# Patient Record
Sex: Female | Born: 1960
Health system: Southern US, Community
[De-identification: ages and names within clinical notes are randomized; demographics above are authoritative.]

## PROBLEM LIST (undated history)

## (undated) DIAGNOSIS — F419 Anxiety disorder, unspecified: Secondary | ICD-10-CM

## (undated) DIAGNOSIS — D649 Anemia, unspecified: Secondary | ICD-10-CM

## (undated) DIAGNOSIS — N200 Calculus of kidney: Secondary | ICD-10-CM

## (undated) DIAGNOSIS — K319 Disease of stomach and duodenum, unspecified: Secondary | ICD-10-CM

## (undated) DIAGNOSIS — F32A Depression, unspecified: Secondary | ICD-10-CM

## (undated) DIAGNOSIS — G43909 Migraine, unspecified, not intractable, without status migrainosus: Secondary | ICD-10-CM

## (undated) DIAGNOSIS — Z9889 Other specified postprocedural states: Secondary | ICD-10-CM

## (undated) DIAGNOSIS — R55 Syncope and collapse: Secondary | ICD-10-CM

## (undated) DIAGNOSIS — F509 Eating disorder, unspecified: Secondary | ICD-10-CM

## (undated) DIAGNOSIS — F329 Major depressive disorder, single episode, unspecified: Secondary | ICD-10-CM

## (undated) HISTORY — PX: TUBAL LIGATION: SHX77

## (undated) HISTORY — DX: Eating disorder, unspecified: F50.9

## (undated) HISTORY — DX: Calculus of kidney: N20.0

## (undated) HISTORY — DX: Depression, unspecified: F32.A

## (undated) HISTORY — DX: Major depressive disorder, single episode, unspecified: F32.9

## (undated) HISTORY — DX: Anemia, unspecified: D64.9

## (undated) HISTORY — DX: Syncope and collapse: R55

## (undated) HISTORY — PX: BREAST BIOPSY: SHX20

## (undated) HISTORY — DX: Other specified postprocedural states: Z98.890

---
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
## 1992-05-03 DIAGNOSIS — Z9889 Other specified postprocedural states: Secondary | ICD-10-CM

## 1992-05-03 HISTORY — PX: LAMINECTOMY: SHX219

## 1992-05-03 HISTORY — DX: Other specified postprocedural states: Z98.890

## 1995-02-11 LAB — US OB COMP ADDL GEST + 14 WK

## 1997-07-30 ENCOUNTER — Other Ambulatory Visit: Admission: RE | Admit: 1997-07-30 | Discharge: 1997-07-30 | Payer: Self-pay | Admitting: Obstetrics and Gynecology

## 1999-02-03 ENCOUNTER — Other Ambulatory Visit: Admission: RE | Admit: 1999-02-03 | Discharge: 1999-02-03 | Payer: Self-pay | Admitting: Obstetrics and Gynecology

## 2000-03-16 ENCOUNTER — Other Ambulatory Visit: Admission: RE | Admit: 2000-03-16 | Discharge: 2000-03-16 | Payer: Self-pay | Admitting: Obstetrics and Gynecology

## 2001-05-05 ENCOUNTER — Other Ambulatory Visit: Admission: RE | Admit: 2001-05-05 | Discharge: 2001-05-05 | Payer: Self-pay | Admitting: Obstetrics and Gynecology

## 2002-06-19 ENCOUNTER — Other Ambulatory Visit: Admission: RE | Admit: 2002-06-19 | Discharge: 2002-06-19 | Payer: Self-pay | Admitting: Obstetrics and Gynecology

## 2002-10-26 LAB — HM DEXA SCAN: HM Dexa Scan: NORMAL

## 2003-08-06 ENCOUNTER — Other Ambulatory Visit: Admission: RE | Admit: 2003-08-06 | Discharge: 2003-08-06 | Payer: Self-pay | Admitting: Obstetrics and Gynecology

## 2004-09-15 ENCOUNTER — Other Ambulatory Visit: Admission: RE | Admit: 2004-09-15 | Discharge: 2004-09-15 | Payer: Self-pay | Admitting: Obstetrics and Gynecology

## 2006-09-14 ENCOUNTER — Encounter: Admission: RE | Admit: 2006-09-14 | Discharge: 2006-09-14 | Payer: Self-pay | Admitting: Obstetrics and Gynecology

## 2008-01-29 ENCOUNTER — Ambulatory Visit (HOSPITAL_BASED_OUTPATIENT_CLINIC_OR_DEPARTMENT_OTHER): Admission: RE | Admit: 2008-01-29 | Discharge: 2008-01-29 | Payer: Self-pay | Admitting: Obstetrics and Gynecology

## 2008-11-19 ENCOUNTER — Emergency Department (HOSPITAL_BASED_OUTPATIENT_CLINIC_OR_DEPARTMENT_OTHER): Admission: EM | Admit: 2008-11-19 | Discharge: 2008-11-19 | Payer: Self-pay | Admitting: Emergency Medicine

## 2009-01-30 ENCOUNTER — Ambulatory Visit: Payer: Self-pay | Admitting: Radiology

## 2009-01-30 ENCOUNTER — Ambulatory Visit (HOSPITAL_BASED_OUTPATIENT_CLINIC_OR_DEPARTMENT_OTHER): Admission: RE | Admit: 2009-01-30 | Discharge: 2009-01-30 | Payer: Self-pay | Admitting: Obstetrics and Gynecology

## 2009-02-06 ENCOUNTER — Encounter: Admission: RE | Admit: 2009-02-06 | Discharge: 2009-02-06 | Payer: Self-pay | Admitting: Obstetrics and Gynecology

## 2009-03-02 ENCOUNTER — Emergency Department (HOSPITAL_BASED_OUTPATIENT_CLINIC_OR_DEPARTMENT_OTHER): Admission: EM | Admit: 2009-03-02 | Discharge: 2009-03-02 | Payer: Self-pay | Admitting: Emergency Medicine

## 2009-08-01 HISTORY — PX: CHOLECYSTECTOMY: SHX55

## 2009-10-31 ENCOUNTER — Emergency Department (HOSPITAL_BASED_OUTPATIENT_CLINIC_OR_DEPARTMENT_OTHER): Admission: EM | Admit: 2009-10-31 | Discharge: 2009-10-31 | Payer: Self-pay | Admitting: Emergency Medicine

## 2010-02-09 ENCOUNTER — Ambulatory Visit: Payer: Self-pay | Admitting: Diagnostic Radiology

## 2010-02-09 ENCOUNTER — Ambulatory Visit (HOSPITAL_BASED_OUTPATIENT_CLINIC_OR_DEPARTMENT_OTHER): Admission: RE | Admit: 2010-02-09 | Discharge: 2010-02-09 | Payer: Self-pay | Admitting: Obstetrics and Gynecology

## 2010-02-09 LAB — HM MAMMOGRAPHY: HM Mammogram: NORMAL

## 2010-08-09 LAB — URINALYSIS, ROUTINE W REFLEX MICROSCOPIC
Bilirubin Urine: NEGATIVE
Ketones, ur: NEGATIVE mg/dL
Nitrite: NEGATIVE
Protein, ur: NEGATIVE mg/dL
Urobilinogen, UA: 0.2 mg/dL (ref 0.0–1.0)
pH: 7 (ref 5.0–8.0)

## 2010-10-05 LAB — HM PAP SMEAR: HM Pap smear: ABNORMAL

## 2010-12-30 ENCOUNTER — Telehealth: Payer: Self-pay | Admitting: Family

## 2010-12-30 ENCOUNTER — Encounter: Payer: Self-pay | Admitting: Family

## 2010-12-30 ENCOUNTER — Ambulatory Visit (INDEPENDENT_AMBULATORY_CARE_PROVIDER_SITE_OTHER): Payer: BC Managed Care – PPO | Admitting: Family

## 2010-12-30 ENCOUNTER — Ambulatory Visit (HOSPITAL_BASED_OUTPATIENT_CLINIC_OR_DEPARTMENT_OTHER)
Admission: RE | Admit: 2010-12-30 | Discharge: 2010-12-30 | Disposition: A | Discharge status: Home / Self Care | Payer: BC Managed Care – PPO | Source: Ambulatory Visit | Attending: Family | Admitting: Family

## 2010-12-30 VITALS — BP 100/70 | HR 66 | Temp 97.8°F | Resp 16 | Wt 127.0 lb

## 2010-12-30 DIAGNOSIS — F32A Depression, unspecified: Secondary | ICD-10-CM | POA: Insufficient documentation

## 2010-12-30 DIAGNOSIS — R1011 Right upper quadrant pain: Secondary | ICD-10-CM | POA: Insufficient documentation

## 2010-12-30 DIAGNOSIS — K7689 Other specified diseases of liver: Secondary | ICD-10-CM | POA: Insufficient documentation

## 2010-12-30 DIAGNOSIS — F3289 Other specified depressive episodes: Secondary | ICD-10-CM

## 2010-12-30 DIAGNOSIS — F410 Panic disorder [episodic paroxysmal anxiety] without agoraphobia: Secondary | ICD-10-CM

## 2010-12-30 DIAGNOSIS — F411 Generalized anxiety disorder: Secondary | ICD-10-CM

## 2010-12-30 DIAGNOSIS — F329 Major depressive disorder, single episode, unspecified: Secondary | ICD-10-CM

## 2010-12-30 DIAGNOSIS — K838 Other specified diseases of biliary tract: Secondary | ICD-10-CM | POA: Insufficient documentation

## 2010-12-30 DIAGNOSIS — R3129 Other microscopic hematuria: Secondary | ICD-10-CM

## 2010-12-30 DIAGNOSIS — F419 Anxiety disorder, unspecified: Secondary | ICD-10-CM

## 2010-12-30 DIAGNOSIS — Q619 Cystic kidney disease, unspecified: Secondary | ICD-10-CM

## 2010-12-30 DIAGNOSIS — Z0189 Encounter for other specified special examinations: Secondary | ICD-10-CM

## 2010-12-30 HISTORY — DX: Panic disorder (episodic paroxysmal anxiety): F41.0

## 2010-12-30 LAB — CBC WITH DIFFERENTIAL/PLATELET
Basophils Absolute: 0 10*3/uL (ref 0.0–0.1)
Basophils Relative: 0 % (ref 0–1)
Eosinophils Relative: 2 % (ref 0–5)
HCT: 42.5 % (ref 36.0–46.0)
Hemoglobin: 14.1 g/dL (ref 12.0–15.0)
Lymphocytes Relative: 50 % — ABNORMAL HIGH (ref 12–46)
MCHC: 33.2 g/dL (ref 30.0–36.0)
MCV: 95.5 fL (ref 78.0–100.0)
Monocytes Absolute: 0.6 10*3/uL (ref 0.1–1.0)
Monocytes Relative: 11 % (ref 3–12)
Neutro Abs: 2 10*3/uL (ref 1.7–7.7)
RDW: 12.2 % (ref 11.5–15.5)

## 2010-12-30 LAB — BASIC METABOLIC PANEL
BUN: 7 mg/dL (ref 6–23)
Chloride: 99 mEq/L (ref 96–112)
Creat: 0.79 mg/dL (ref 0.50–1.10)
Glucose, Bld: 89 mg/dL (ref 70–99)
Potassium: 4.3 mEq/L (ref 3.5–5.3)

## 2010-12-30 LAB — AMYLASE: Amylase: 36 U/L (ref 0–105)

## 2010-12-30 LAB — HEPATIC FUNCTION PANEL
AST: 18 U/L (ref 0–37)
Albumin: 4.7 g/dL (ref 3.5–5.2)
Alkaline Phosphatase: 46 U/L (ref 39–117)
Bilirubin, Direct: 0.1 mg/dL (ref 0.0–0.3)
Total Bilirubin: 0.4 mg/dL (ref 0.3–1.2)

## 2010-12-30 LAB — POCT URINALYSIS DIPSTICK
Ketones, UA: NEGATIVE
Spec Grav, UA: 1.005
Urobilinogen, UA: 0.2
pH, UA: 8

## 2010-12-30 MED ORDER — PROMETHAZINE HCL 25 MG PO TABS: 25.0000 mg | ORAL_TABLET | Freq: Four times a day (QID) | ORAL | Status: DC | PRN

## 2010-12-30 MED ORDER — IOHEXOL 300 MG/ML  SOLN
100.0000 mL | Freq: Once | INTRAMUSCULAR | Status: AC | PRN
  Administered 2010-12-30: 100 mL via INTRAVENOUS

## 2010-12-30 NOTE — Assessment & Plan Note (Signed)
Will send for CT abdomen/pelvis.  UA notes trace blood.  Differential includes pancreatitis, ulcer, kidney stone.  She also has a "liver mass" which I presume is a hemangioma- await CT results.  Check basic laboratories.  She requested something for pain.  I recommended tylenol.  Later review of the [place] Controlled Substance Registry notes that she is receiving 120 hydrocodone 10/325 a month from Dr. Alton Revere and has been once a month for the last 1 year.

## 2010-12-30 NOTE — Patient Instructions (Signed)
Please complete your lab work and your CT on the first floor. Follow up in 1 week. Call if symptoms worsen or if they do not improve.

## 2010-12-30 NOTE — Telephone Encounter (Signed)
Pt presented to the office following CT requesting refill on promethazine. Rx sent to pharmacy.

## 2010-12-30 NOTE — Assessment & Plan Note (Signed)
On clonazepam.  This is being managed by psychiatry.
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
## 2010-12-30 NOTE — Assessment & Plan Note (Signed)
This is being managed by Dr. Donell Beers.  She also sees a therapist.

## 2011-01-01 ENCOUNTER — Telehealth: Payer: Self-pay | Admitting: Family

## 2011-01-01 ENCOUNTER — Ambulatory Visit: Payer: Self-pay | Admitting: Family

## 2011-01-01 NOTE — Telephone Encounter (Signed)
Late entry: spoke with pt yesterday morning and reviewed the CT and lab results.  Will refer back to Dr. Logan Bores (GI) for evaluation, though normal LFT's are somewhat reassuring.  Pt is agreeable to plan.

## 2011-01-05 ENCOUNTER — Telehealth: Payer: Self-pay | Admitting: Family

## 2011-01-05 NOTE — Telephone Encounter (Signed)
Dr Logan Bores is not seeing patient in [facility]   The first appt available is November 21st  Do you want her to see someone in [facility] or [facility]

## 2011-01-05 NOTE — Telephone Encounter (Signed)
I would recommend that she be scheduled to see one of his partners at [facility] as they have all of her records.

## 2011-01-27 ENCOUNTER — Other Ambulatory Visit: Payer: Self-pay | Admitting: Family

## 2011-01-27 NOTE — Telephone Encounter (Signed)
Needs OV prior to refill

## 2011-01-27 NOTE — Telephone Encounter (Signed)
Please advise if ok to refill Promethazine. Pt was advised to f/u in 1 week after 12/30/10 office visit and did not. Has no future appt on file.

## 2011-01-27 NOTE — Telephone Encounter (Signed)
Spoke with pt, she had appt with specialist at [facility] in the middle of October. Per Melissa, ok to give #30 x no refills. Refill sent to pharmacy. Message left on home phone.

## 2011-01-28 ENCOUNTER — Encounter: Payer: Self-pay | Admitting: Family

## 2011-01-28 ENCOUNTER — Telehealth: Payer: Self-pay | Admitting: *Deleted

## 2011-01-28 NOTE — Telephone Encounter (Signed)
Received records from Dr Lorin Picket, Dr Adolphus Birchwood, Dr Norma Fredrickson, [place] [place] PA, Julio Sicks, Dr Katha Hamming and Dr Adelene Idler and forwarded to Provider for review.

## 2011-02-15 ENCOUNTER — Other Ambulatory Visit: Payer: Self-pay | Admitting: Family

## 2011-02-15 NOTE — Telephone Encounter (Signed)
Promethazine #30 x no refills was called to pharmacy on 01/27/11. Pt is past due for follow up. Refill is denied until pt is seen in the office. Denial sent to pharmacy.

## 2011-02-16 ENCOUNTER — Encounter: Payer: Self-pay | Admitting: Family

## 2011-02-16 DIAGNOSIS — R896 Abnormal cytological findings in specimens from other organs, systems and tissues: Secondary | ICD-10-CM

## 2011-02-16 DIAGNOSIS — IMO0001 Reserved for inherently not codable concepts without codable children: Secondary | ICD-10-CM | POA: Insufficient documentation

## 2011-02-16 NOTE — Telephone Encounter (Signed)
Pt left message on voicemail requesting refill of Promethazine and requesting reason for denial of refill. Attempted to reach pt and left detailed message on voicemail that [facility] had wanted her to return to the office for follow up of visit in August. Pt returned my call and scheduled f/u for tomorrow at 8:15am.

## 2011-02-17 ENCOUNTER — Encounter: Payer: Self-pay | Admitting: Family

## 2011-02-17 ENCOUNTER — Ambulatory Visit (INDEPENDENT_AMBULATORY_CARE_PROVIDER_SITE_OTHER): Payer: BC Managed Care – PPO | Admitting: Family

## 2011-02-17 DIAGNOSIS — F341 Dysthymic disorder: Secondary | ICD-10-CM

## 2011-02-17 DIAGNOSIS — G8929 Other chronic pain: Secondary | ICD-10-CM

## 2011-02-17 DIAGNOSIS — F32A Depression, unspecified: Secondary | ICD-10-CM

## 2011-02-17 DIAGNOSIS — F419 Anxiety disorder, unspecified: Secondary | ICD-10-CM

## 2011-02-17 DIAGNOSIS — R1011 Right upper quadrant pain: Secondary | ICD-10-CM

## 2011-02-17 MED ORDER — ONDANSETRON HCL 4 MG PO TABS: 4.0000 mg | ORAL_TABLET | Freq: Three times a day (TID) | ORAL | Status: AC | PRN

## 2011-02-17 NOTE — Progress Notes (Signed)
Subjective:    Patient ID: Shannon [name], female    DOB: 05-19-1960, 50 y.o.   MRN: [id number]  HPI  [name] is a 50 yr old female who presents today for follow up.    RUQ pain- unchanged.  Notes chronic nausea- has apt with [facility] (GI) next weeks.  Chronic nausea is unchanged.    Chronic pain- managed by Dr. Alton Revere.   She tells me that she was on the hydrocodone prior to her Gallbladder surgery and did not have significant nausea at that time.   Notes slight tremors- ? Due to effexor. She tells me that she discussed this with Dr. Alton Revere and plans to discuss it with Dr. Donell Beers her psychiatrist.   Depression/anxiety- dad was diagnosed with brain tumor.  She is finding this difficult.    Review of Systems See HPI  Past Medical History  Diagnosis Date  . Anemia   . Depression   . Eating disorder     anorexia nervosa in her 20's  . Fainting spell   . Kidney stones     passed on their own  . Hx of laminectomy 1994    L4-5    History   Social History  . Marital Status: Married    Spouse Name: N/A    Number of Children: 3  . Years of Education: N/A   Occupational History  .     Social History Main Topics  . Smoking status: Never Smoker   . Smokeless tobacco: Never Used  . Alcohol Use: No  . Drug Use: No  . Sexually Active: Not on file   Other Topics Concern  . Not on file   Social History Narrative   Regular exercise:  Yes (swim instructor)Caffeine Use:  NoMarried 3 children ages 23 son, 58 son, and 51 daughterAssociates degree    Past Surgical History  Procedure Date  . Cholecystectomy 08/2009    reports history of gallbladder polyps, she reports stents in duct of lushca   . Appendectomy 1976  . Tonsillectomy 1982  . Laminectomy 1994    L4-5  . Tubal ligation     Family History  Problem Relation Age of Onset  . Hypertension Mother   . Diabetes Father   . Alcohol abuse Maternal Grandmother   . Cancer Paternal Grandfather     lung cancer      Allergies  Allergen Reactions  . Flexeril (Cyclobenzaprine Hcl)     Mood swings  . Gabapentin     sedation  . Lunesta     Headaches  . Morphine And Related   . Nsaids     Gi bleed  . Paroxetine     Nausea     Current Outpatient Prescriptions on File Prior to Visit  Medication Sig Dispense Refill  . clonazePAM (KLONOPIN) 1 MG tablet Take 2 tablets twice a day and an additional 1/2 tablet as directed if needed.       . traMADol (ULTRAM) 50 MG tablet Take 50 mg by mouth 3 (three) times daily as needed.        . venlafaxine (EFFEXOR-XR) 75 MG 24 hr capsule Take 2 capsules every morning.         BP 98/64  Pulse 102  Temp(Src) 97.4 F (36.3 C) (Oral)  Resp 16  Wt 131 lb 1.3 oz (59.457 kg)       Objective:   Physical Exam  Constitutional: She appears well-developed and well-nourished.  Cardiovascular: Normal rate and regular rhythm.  Pulmonary/Chest: Effort normal and breath sounds normal.  Abdominal: Soft. Bowel sounds are normal. She exhibits no distension.  Neurological:       Fine resting bilateral hand tremor.  Psychiatric: She has a normal mood and affect. Her behavior is normal. Judgment and thought content normal.          Assessment & Plan:   BP Readings from Last 3 Encounters:  02/17/11 98/64  12/30/10 100/70

## 2011-02-17 NOTE — Assessment & Plan Note (Signed)
She is on Effexor and this is being managed by Dr. Donell Beers.  She will discuss the hand tremor with him at her upcoming visit.

## 2011-02-17 NOTE — Patient Instructions (Signed)
Please follow up in 3 months for a complete physical.  Come fasting to this appointment.

## 2011-02-17 NOTE — Assessment & Plan Note (Signed)
This is being managed by Dr. Rosario Jacks who is treating her for chronic neck/back pain.

## 2011-02-17 NOTE — Assessment & Plan Note (Signed)
Pt with chronic right upper quadrant pain following her cholecystectomy.  She is scheduled to see GI next week.  Will try switching her from phenergan to zofran.  She does reports some mild dizziness, hopefully this med change will help.

## 2011-02-19 ENCOUNTER — Telehealth: Payer: Self-pay | Admitting: *Deleted

## 2011-02-19 MED ORDER — PROMETHAZINE HCL 25 MG PO TABS: 25.0000 mg | ORAL_TABLET | Freq: Four times a day (QID) | ORAL | Status: DC | PRN

## 2011-02-19 NOTE — Telephone Encounter (Signed)
Only other reasonable alternative is the phenergan.  I can refill phenergan if she wants instead.  I would recommend that she discuss this further with GI at her upcoming visit with them.

## 2011-02-19 NOTE — Telephone Encounter (Signed)
Notified pt, she would like phenergan rx sent to [facility] at [employer]. States the nausea is awful, bloating has returned and she wishes she could get in to see the GI group sooner. What strength and directions of phenergan do you want me to send to the pharmacy?

## 2011-02-19 NOTE — Telephone Encounter (Signed)
Left detailed message on voicemail re: instructions below and to call if any questions.

## 2011-02-19 NOTE — Telephone Encounter (Signed)
Received voice message from pt stating the Zofran has not helped her nausea. States that it is worse and she is has a headache since starting the Zofran. Wants to know if there is another alternative? Please advise.

## 2011-02-19 NOTE — Telephone Encounter (Signed)
Rx was sent to pharmacy. Pt should go to the ER if she develops worsening abdominal pain, or fever over the weekend.

## 2011-04-09 ENCOUNTER — Other Ambulatory Visit: Payer: Self-pay | Admitting: Family

## 2011-04-09 NOTE — Telephone Encounter (Signed)
Pt was switched from Zofran to Phenergan on 02/19/11. Left detailed message to return my call and verify request as she previously stated zofran was not effective and caused headaches. Also asked pt to verify when she will be seeing GI group?

## 2011-04-12 NOTE — Telephone Encounter (Signed)
Spoke with pt, she states request should have gone to specialist at [facility]. She has contacted them re: request. Pt states she has 3 procedures coming up in January with GI. Can only remember gastric emptying study at present but wanted you to be aware.

## 2011-05-10 ENCOUNTER — Encounter: Payer: BC Managed Care – PPO | Admitting: Family

## 2011-05-10 DIAGNOSIS — Z0289 Encounter for other administrative examinations: Secondary | ICD-10-CM

## 2011-05-13 ENCOUNTER — Telehealth: Payer: Self-pay | Admitting: *Deleted

## 2011-05-13 NOTE — Telephone Encounter (Signed)
Received record from Dr Sander Nephew office at [facility] (GI) and forwarded to provider for review.

## 2011-05-25 ENCOUNTER — Ambulatory Visit (INDEPENDENT_AMBULATORY_CARE_PROVIDER_SITE_OTHER): Payer: BC Managed Care – PPO | Admitting: Family

## 2011-05-25 VITALS — BP 90/68 | HR 67 | Temp 97.9°F | Resp 12 | Wt 131.1 lb

## 2011-05-25 DIAGNOSIS — R87610 Atypical squamous cells of undetermined significance on cytologic smear of cervix (ASC-US): Secondary | ICD-10-CM

## 2011-05-25 DIAGNOSIS — R58 Hemorrhage, not elsewhere classified: Secondary | ICD-10-CM

## 2011-05-25 DIAGNOSIS — R233 Spontaneous ecchymoses: Secondary | ICD-10-CM | POA: Insufficient documentation

## 2011-05-25 DIAGNOSIS — I998 Other disorder of circulatory system: Secondary | ICD-10-CM

## 2011-05-25 DIAGNOSIS — IMO0001 Reserved for inherently not codable concepts without codable children: Secondary | ICD-10-CM

## 2011-05-25 DIAGNOSIS — G8929 Other chronic pain: Secondary | ICD-10-CM

## 2011-05-25 DIAGNOSIS — T148XXA Other injury of unspecified body region, initial encounter: Secondary | ICD-10-CM

## 2011-05-25 HISTORY — DX: Hemorrhage, not elsewhere classified: R58

## 2011-05-25 LAB — CBC WITH DIFFERENTIAL/PLATELET
Basophils Relative: 0 % (ref 0–1)
Eosinophils Absolute: 0.1 10*3/uL (ref 0.0–0.7)
Eosinophils Relative: 2 % (ref 0–5)
HCT: 39.5 % (ref 36.0–46.0)
Lymphocytes Relative: 38 % (ref 12–46)
Lymphs Abs: 2.1 10*3/uL (ref 0.7–4.0)
MCH: 31.9 pg (ref 26.0–34.0)
MCV: 96.1 fL (ref 78.0–100.0)
Monocytes Absolute: 0.7 10*3/uL (ref 0.1–1.0)
RDW: 12.4 % (ref 11.5–15.5)
WBC: 5.6 10*3/uL (ref 4.0–10.5)

## 2011-05-25 NOTE — Progress Notes (Signed)
Subjective:    Patient ID: Shannon [name], female    DOB: Oct 12, 1960, 51 y.o.   MRN: [id number]  HPI  Ms.  Dunton presents today to discuss bruising.   1) Bruising- reports 1 month hx of abnormal bruising, especialy on her legs.  Areas are tender.  She is not taking aspirin.  She notes that she is getting her period every 2 weeks. She is scheduled for coloposcopy due to two abnormal pap smears, also scheduled for ultrasound.    2) Hand tremor- she reports that this is resolved.    3) Chronic abdominal pain/nausea- she continues to follow at [facility] for this and she tells me that her symptoms are unchanged.   Review of Systems See HPI  Past Medical History  Diagnosis Date  . Anemia   . Depression   . Eating disorder     anorexia nervosa in her 20's  . Fainting spell   . Kidney stones     passed on their own  . Hx of laminectomy 1994    L4-5    History   Social History  . Marital Status: Married    Spouse Name: N/A    Number of Children: 3  . Years of Education: N/A   Occupational History  .     Social History Main Topics  . Smoking status: Never Smoker   . Smokeless tobacco: Never Used  . Alcohol Use: No  . Drug Use: No  . Sexually Active: Not on file   Other Topics Concern  . Not on file   Social History Narrative   Regular exercise:  Yes (swim instructor)Caffeine Use:  NoMarried 3 children ages 19 son, 55 son, and 31 daughterAssociates degree    Past Surgical History  Procedure Date  . Cholecystectomy 08/2009    reports history of gallbladder polyps, she reports stents in duct of lushca   . Appendectomy 1976  . Tonsillectomy 1982  . Laminectomy 1994    L4-5  . Tubal ligation     Family History  Problem Relation Age of Onset  . Hypertension Mother   . Diabetes Father   . Alcohol abuse Maternal Grandmother   . Cancer Paternal Grandfather     lung cancer    Allergies  Allergen Reactions  . Flexeril (Cyclobenzaprine Hcl)     Mood swings    . Gabapentin     sedation  . Lunesta     Headaches  . Morphine And Related   . Nsaids     Gi bleed  . Paroxetine     Nausea   . Tizanidine Hcl Other (See Comments)    Dizziness, mood swings    Current Outpatient Prescriptions on File Prior to Visit  Medication Sig Dispense Refill  . clonazePAM (KLONOPIN) 1 MG tablet Take 1 tablet in the morning and lunch and 2 tablets at bedtime.      Marland Kitchen HYDROcodone-acetaminophen (NORCO) 10-325 MG per tablet Take 1 tablet four times daily as needed for back pain.       . traMADol (ULTRAM) 50 MG tablet Take 50 mg by mouth 3 (three) times daily as needed.        . venlafaxine (EFFEXOR-XR) 75 MG 24 hr capsule Take 2 capsules every morning.         BP 90/68  Pulse 67  Temp(Src) 97.9 F (36.6 C) (Oral)  Resp 12  Wt 131 lb 1.3 oz (59.457 kg)  SpO2 94%  LMP 05/11/2011  Objective:   Physical Exam  Constitutional: She appears well-developed and well-nourished. No distress.  Cardiovascular: Normal rate and regular rhythm.   No murmur heard. Pulmonary/Chest: Effort normal and breath sounds normal. No respiratory distress. She has no wheezes. She has no rales. She exhibits no tenderness.  Musculoskeletal: She exhibits no edema.  Skin:       Some bruising noted on bilateral shins.           Assessment & Plan:

## 2011-05-25 NOTE — Assessment & Plan Note (Signed)
>>  ASSESSMENT AND PLAN FOR ECCHYMOSIS WRITTEN ON 05/25/2011  1:22 PM BY O'SULLIVAN, Jadalyn Oliveri, NP  Obtain CBC with diff.

## 2011-05-25 NOTE — Assessment & Plan Note (Signed)
She is following with GYN for colposcopy.

## 2011-05-25 NOTE — Assessment & Plan Note (Signed)
Obtain CBC with diff.
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
## 2011-05-25 NOTE — Patient Instructions (Signed)
Please follow up in February as scheduled for your physical. Complete lab work prior to leaving today.

## 2011-05-26 ENCOUNTER — Encounter: Payer: Self-pay | Admitting: Family

## 2011-06-04 ENCOUNTER — Encounter: Payer: BC Managed Care – PPO | Admitting: Family

## 2011-06-11 ENCOUNTER — Encounter: Payer: BC Managed Care – PPO | Admitting: Family

## 2011-06-17 ENCOUNTER — Telehealth: Payer: Self-pay | Admitting: *Deleted

## 2011-06-17 NOTE — Telephone Encounter (Signed)
Received call from pt stating she had stitches placed over her left thumb last Thursday at [facility]. Pt states they told her to have stitches removed in 9-10 days and wanted to know if it would be ok to wait until Monday to have them removed as Provider is out of the office today and tomorrow. Advised pt to follow up with [facility] and defer question to them as they placed the stitches. Pt voices understanding.

## 2011-07-02 ENCOUNTER — Encounter: Payer: BC Managed Care – PPO | Admitting: Family

## 2011-07-02 HISTORY — PX: HYSTEROSCOPY: SHX211

## 2011-07-12 NOTE — H&P (Signed)
51 year old G4 P3 who has had a BTL presents with menorrhagia. Ultrasound suggestive of adenomyosis. After counseling about different options, patient elects to have endometrial ablation.  Medical history: Unremarkable  Surgical history: BTL Laparoscopic cholecystectomy  Medications see list  Allergies NKDA  Social history : Married No tobacco or ETOH  Family history  Unremarkable  Afebrile  Vital signs stable General alert and oriented Lung CTAB Car RRR Abdomen is soft and non tender Pelvic is normal  IMPRESSION: Menorrhagia  PLAN D and C , Hysteroscopy, Endometrial ablation Risks already discussed with patient

## 2011-07-14 ENCOUNTER — Encounter (HOSPITAL_COMMUNITY): Payer: Self-pay

## 2011-07-14 ENCOUNTER — Encounter (HOSPITAL_COMMUNITY)
Admission: RE | Admit: 2011-07-14 | Discharge: 2011-07-14 | Disposition: A | Discharge status: Home / Self Care | Payer: BC Managed Care – PPO | Source: Ambulatory Visit | Attending: Obstetrics and Gynecology | Admitting: Obstetrics and Gynecology

## 2011-07-14 HISTORY — DX: Anxiety disorder, unspecified: F41.9

## 2011-07-14 HISTORY — DX: Disease of stomach and duodenum, unspecified: K31.9

## 2011-07-14 LAB — CBC
HCT: 38.9 % (ref 36.0–46.0)
MCHC: 32.9 g/dL (ref 30.0–36.0)
MCV: 95.1 fL (ref 78.0–100.0)
RDW: 12.3 % (ref 11.5–15.5)

## 2011-07-14 NOTE — Patient Instructions (Addendum)
20 Shannon [name]  07/14/2011   Your procedure is scheduled on:  07/15/11  Enter through the Main Entrance of [facility] at 6 AM.  Pick up the phone at the desk and dial [phone].   Call this number if you have problems the morning of surgery: [phone]   Remember:   Do not eat food:After Midnight.  Do not drink clear liquids: After Midnight.  Take these medicines the morning of surgery with A SIP OF WATER: Take Effexor,  May take Klonopin if needed      Do not wear jewelry, make-up or nail polish.  Do not wear lotions, powders, or perfumes. You may wear deodorant.  Do not shave 48 hours prior to surgery.  Do not bring valuables to the hospital.  Contacts, dentures or bridgework may not be worn into surgery.  Leave suitcase in the car. After surgery it may be brought to your room.  For patients admitted to the hospital, checkout time is 11:00 AM the day of discharge.   Patients discharged the day of surgery will not be allowed to drive home.  Name and phone number of your driver: husband  Special Instructions: CHG Shower Use Special Wash: 1/2 bottle night before surgery and 1/2 bottle morning of surgery.   Please read over the following fact sheets that you were given: Surgical Site Infection Prevention

## 2011-07-15 ENCOUNTER — Ambulatory Visit (HOSPITAL_COMMUNITY)
Admission: RE | Admit: 2011-07-15 | Discharge: 2011-07-15 | Disposition: A | Discharge status: Home / Self Care | Payer: BC Managed Care – PPO | Source: Ambulatory Visit | Attending: Obstetrics and Gynecology | Admitting: Obstetrics and Gynecology

## 2011-07-15 ENCOUNTER — Encounter (HOSPITAL_COMMUNITY): Payer: Self-pay | Admitting: *Deleted

## 2011-07-15 ENCOUNTER — Inpatient Hospital Stay (HOSPITAL_COMMUNITY)
Admission: AD | Admit: 2011-07-15 | Discharge: 2011-07-16 | Disposition: A | Discharge status: Other Acute Inpatient Hospital | Payer: BC Managed Care – PPO | Source: Ambulatory Visit | Attending: Obstetrics and Gynecology | Admitting: Obstetrics and Gynecology

## 2011-07-15 ENCOUNTER — Encounter (HOSPITAL_COMMUNITY): Payer: Self-pay | Admitting: Anesthesiology

## 2011-07-15 ENCOUNTER — Ambulatory Visit (HOSPITAL_COMMUNITY)
Admission: RE | Admit: 2011-07-15 | Payer: BC Managed Care – PPO | Source: Ambulatory Visit | Admitting: Obstetrics and Gynecology

## 2011-07-15 ENCOUNTER — Ambulatory Visit (HOSPITAL_COMMUNITY): Payer: BC Managed Care – PPO | Admitting: Anesthesiology

## 2011-07-15 ENCOUNTER — Other Ambulatory Visit: Payer: Self-pay

## 2011-07-15 ENCOUNTER — Encounter (HOSPITAL_COMMUNITY)
Admission: RE | Disposition: A | Discharge status: Home / Self Care | Payer: Self-pay | Source: Ambulatory Visit | Attending: Obstetrics and Gynecology

## 2011-07-15 DIAGNOSIS — Z79899 Other long term (current) drug therapy: Secondary | ICD-10-CM | POA: Insufficient documentation

## 2011-07-15 DIAGNOSIS — F3289 Other specified depressive episodes: Secondary | ICD-10-CM | POA: Insufficient documentation

## 2011-07-15 DIAGNOSIS — R51 Headache: Secondary | ICD-10-CM | POA: Insufficient documentation

## 2011-07-15 DIAGNOSIS — R58 Hemorrhage, not elsewhere classified: Secondary | ICD-10-CM

## 2011-07-15 DIAGNOSIS — G8929 Other chronic pain: Secondary | ICD-10-CM

## 2011-07-15 DIAGNOSIS — N920 Excessive and frequent menstruation with regular cycle: Secondary | ICD-10-CM | POA: Insufficient documentation

## 2011-07-15 DIAGNOSIS — Z01818 Encounter for other preprocedural examination: Secondary | ICD-10-CM | POA: Insufficient documentation

## 2011-07-15 DIAGNOSIS — H53149 Visual discomfort, unspecified: Secondary | ICD-10-CM | POA: Insufficient documentation

## 2011-07-15 DIAGNOSIS — F411 Generalized anxiety disorder: Secondary | ICD-10-CM | POA: Insufficient documentation

## 2011-07-15 DIAGNOSIS — R110 Nausea: Secondary | ICD-10-CM | POA: Insufficient documentation

## 2011-07-15 DIAGNOSIS — R1011 Right upper quadrant pain: Secondary | ICD-10-CM

## 2011-07-15 DIAGNOSIS — R109 Unspecified abdominal pain: Secondary | ICD-10-CM | POA: Insufficient documentation

## 2011-07-15 DIAGNOSIS — IMO0001 Reserved for inherently not codable concepts without codable children: Secondary | ICD-10-CM

## 2011-07-15 DIAGNOSIS — Z01812 Encounter for preprocedural laboratory examination: Secondary | ICD-10-CM | POA: Insufficient documentation

## 2011-07-15 DIAGNOSIS — Z87442 Personal history of urinary calculi: Secondary | ICD-10-CM | POA: Insufficient documentation

## 2011-07-15 DIAGNOSIS — F32A Depression, unspecified: Secondary | ICD-10-CM

## 2011-07-15 DIAGNOSIS — F329 Major depressive disorder, single episode, unspecified: Secondary | ICD-10-CM

## 2011-07-15 LAB — CBC
Hemoglobin: 11.4 g/dL — ABNORMAL LOW (ref 12.0–15.0)
MCV: 96.6 fL (ref 78.0–100.0)
Platelets: 226 10*3/uL (ref 150–400)
RBC: 3.57 MIL/uL — ABNORMAL LOW (ref 3.87–5.11)
WBC: 6 10*3/uL (ref 4.0–10.5)

## 2011-07-15 LAB — COMPREHENSIVE METABOLIC PANEL
ALT: 17 U/L (ref 0–35)
Alkaline Phosphatase: 38 U/L — ABNORMAL LOW (ref 39–117)
BUN: 11 mg/dL (ref 6–23)
CO2: 29 mEq/L (ref 19–32)
GFR calc Af Amer: >90 mL/min (ref >90)
GFR calc non Af Amer: >90 mL/min (ref >90)
Glucose, Bld: 305 mg/dL — ABNORMAL HIGH (ref 70–99)
Potassium: 3.8 mEq/L (ref 3.5–5.1)
Sodium: 136 mEq/L (ref 135–145)
Total Bilirubin: 0.2 mg/dL — ABNORMAL LOW (ref 0.3–1.2)

## 2011-07-15 LAB — DIFFERENTIAL
Lymphocytes Relative: 21 % (ref 12–46)
Lymphs Abs: 1.2 10*3/uL (ref 0.7–4.0)
Monocytes Relative: 7 % (ref 3–12)
Neutrophils Relative %: 73 % (ref 43–77)

## 2011-07-15 SURGERY — DILATATION & CURETTAGE/HYSTEROSCOPY WITH THERMACHOICE ABLATION
Anesthesia: General | Site: Vagina | Wound class: Clean Contaminated

## 2011-07-15 MED ORDER — DEXTROSE 5 % IV SOLN
INTRAVENOUS | Status: DC | PRN
  Administered 2011-07-15: 1000 mL via INTRAVENOUS

## 2011-07-15 MED ORDER — KETOROLAC TROMETHAMINE 60 MG/2ML IM SOLN
INTRAMUSCULAR | Status: AC
  Filled 2011-07-15: qty 2

## 2011-07-15 MED ORDER — HYDROMORPHONE HCL PF 1 MG/ML IJ SOLN
1.0000 mg | Freq: Once | INTRAMUSCULAR | Status: AC
  Administered 2011-07-15: 1 mg via INTRAVENOUS
  Filled 2011-07-15: qty 1

## 2011-07-15 MED ORDER — FENTANYL CITRATE 0.05 MG/ML IJ SOLN
INTRAMUSCULAR | Status: DC | PRN
  Administered 2011-07-15 (×2): 50 ug via INTRAVENOUS

## 2011-07-15 MED ORDER — HYDROMORPHONE HCL PF 1 MG/ML IJ SOLN
INTRAMUSCULAR | Status: AC
  Filled 2011-07-15: qty 1

## 2011-07-15 MED ORDER — CEFAZOLIN SODIUM 1-5 GM-% IV SOLN
INTRAVENOUS | Status: AC
  Filled 2011-07-15: qty 50

## 2011-07-15 MED ORDER — ONDANSETRON HCL 4 MG/2ML IJ SOLN
INTRAMUSCULAR | Status: DC | PRN
  Administered 2011-07-15: 4 mg via INTRAVENOUS

## 2011-07-15 MED ORDER — FENTANYL CITRATE 0.05 MG/ML IJ SOLN
INTRAMUSCULAR | Status: AC
  Filled 2011-07-15: qty 2

## 2011-07-15 MED ORDER — ONDANSETRON HCL 4 MG/2ML IJ SOLN
INTRAMUSCULAR | Status: AC
  Filled 2011-07-15: qty 2

## 2011-07-15 MED ORDER — LACTATED RINGERS IV SOLN
INTRAVENOUS | Status: DC
  Administered 2011-07-15: 07:00:00 via INTRAVENOUS

## 2011-07-15 MED ORDER — MIDAZOLAM HCL 2 MG/2ML IJ SOLN
INTRAMUSCULAR | Status: AC
  Filled 2011-07-15: qty 2

## 2011-07-15 MED ORDER — PROPOFOL 10 MG/ML IV EMUL
INTRAVENOUS | Status: DC | PRN
  Administered 2011-07-15: 200 mg via INTRAVENOUS

## 2011-07-15 MED ORDER — CEFAZOLIN SODIUM 1-5 GM-% IV SOLN
1.0000 g | INTRAVENOUS | Status: AC
  Administered 2011-07-15: 1 g via INTRAVENOUS

## 2011-07-15 MED ORDER — DEXTROSE 5 % IN LACTATED RINGERS IV BOLUS
1000.0000 mL | Freq: Once | INTRAVENOUS | Status: AC
  Administered 2011-07-15: 1000 mL via INTRAVENOUS

## 2011-07-15 MED ORDER — LACTATED RINGERS IV SOLN
Freq: Once | INTRAVENOUS | Status: AC
  Administered 2011-07-15: 1000 mL via INTRAVENOUS

## 2011-07-15 MED ORDER — PROPOFOL 10 MG/ML IV EMUL
INTRAVENOUS | Status: AC
  Filled 2011-07-15: qty 20

## 2011-07-15 MED ORDER — LIDOCAINE HCL 1 % IJ SOLN
INTRAMUSCULAR | Status: DC | PRN
  Administered 2011-07-15: 10 mL

## 2011-07-15 MED ORDER — LIDOCAINE HCL (CARDIAC) 20 MG/ML IV SOLN
INTRAVENOUS | Status: AC
  Filled 2011-07-15: qty 5

## 2011-07-15 MED ORDER — MIDAZOLAM HCL 5 MG/5ML IJ SOLN
INTRAMUSCULAR | Status: DC | PRN
  Administered 2011-07-15: 2 mg via INTRAVENOUS

## 2011-07-15 MED ORDER — DEXAMETHASONE SODIUM PHOSPHATE 10 MG/ML IJ SOLN
INTRAMUSCULAR | Status: AC
  Filled 2011-07-15: qty 1

## 2011-07-15 MED ORDER — ONDANSETRON HCL 4 MG/2ML IJ SOLN
4.0000 mg | Freq: Once | INTRAMUSCULAR | Status: AC | PRN
  Administered 2011-07-15: 4 mg via INTRAVENOUS

## 2011-07-15 MED ORDER — HYDROMORPHONE HCL 4 MG PO TABS: 4.0000 mg | ORAL_TABLET | ORAL | Status: AC | PRN

## 2011-07-15 MED ORDER — HYDROMORPHONE HCL PF 1 MG/ML IJ SOLN
0.2500 mg | INTRAMUSCULAR | Status: DC | PRN
  Administered 2011-07-15 (×4): 0.5 mg via INTRAVENOUS

## 2011-07-15 MED ORDER — DEXAMETHASONE SODIUM PHOSPHATE 4 MG/ML IJ SOLN
INTRAMUSCULAR | Status: DC | PRN
  Administered 2011-07-15: 10 mg via INTRAVENOUS

## 2011-07-15 MED ORDER — KETOROLAC TROMETHAMINE 30 MG/ML IJ SOLN
INTRAMUSCULAR | Status: DC | PRN
  Administered 2011-07-15: 60 mg via INTRAVENOUS

## 2011-07-15 SURGICAL SUPPLY — 11 items
CANISTER SUCTION 2500CC (MISCELLANEOUS) ×2 IMPLANT
CATH ROBINSON RED A/P 16FR (CATHETERS) ×2 IMPLANT
CATH THERMACHOICE III (CATHETERS) ×2 IMPLANT
CLOTH BEACON ORANGE TIMEOUT ST (SAFETY) ×2 IMPLANT
CONTAINER PREFILL 10% NBF 60ML (FORM) ×4 IMPLANT
GLOVE BIO SURGEON STRL SZ 6.5 (GLOVE) ×4 IMPLANT
GOWN PREVENTION PLUS LG XLONG (DISPOSABLE) ×2 IMPLANT
GOWN STRL REIN XL XLG (GOWN DISPOSABLE) ×2 IMPLANT
PACK HYSTEROSCOPY LF (CUSTOM PROCEDURE TRAY) ×2 IMPLANT
TOWEL OR 17X24 6PK STRL BLUE (TOWEL DISPOSABLE) ×4 IMPLANT
WATER STERILE IRR 1000ML POUR (IV SOLUTION) ×2 IMPLANT

## 2011-07-15 NOTE — Addendum Note (Signed)
Addendum  created 07/15/11 0831 by Suella Grove, CRNA   Modules edited:Notes Section

## 2011-07-15 NOTE — Op Note (Signed)
Shannon [name], Shannon [name]            ACCOUNT NO.:  [id number]  MEDICAL RECORD NO.:  [id number]  LOCATION:  [facility]                          FACILITY:  [facility]  PHYSICIAN:  Khara Renaud L. Keene Gilkey, M.D.DATE OF BIRTH:  09-29-1960  DATE OF PROCEDURE:  07/15/2011 DATE OF DISCHARGE:                              OPERATIVE REPORT   PREOPERATIVE DIAGNOSIS:  Menorrhagia.  POSTOPERATIVE DIAGNOSIS:  Menorrhagia.  PROCEDURE:  D and C, hysteroscopy, ThermaChoice endometrial ablation.  SURGEON:  Rane Blitch L. Vincente Poli, M.D.  ANESTHESIA:  Paracervical block with IV sedation.  COMPLICATIONS:  None.  PATHOLOGY:  Uterine curettings.  PROCEDURE:  The patient was taken to the operating room where anesthesia was administered.  She was prepped and draped in usual fashion.  In and out catheter was used to empty the bladder.  Speculum was inserted into the vagina.  The cervix was grasped with a tenaculum.  Paracervical block was performed without difficulty.  The diagnostic hysteroscope was inserted into the uterine cavity.  Uterus was normal size, retroverted, no intracavitary masses.  The hysteroscope was removed and a sharp curette was inserted and the uterus was thoroughly curetted of all tissue.  The ThermaChoice 3 balloon was inserted and a ThermaChoice endometrial ablation was performed according to the manufacturer's specifications.  After the 8 minute cycle, the intact balloon was removed.  The patient tolerated the procedure well.  There was no bleeding noted.  At the end of the procedure, all instruments were removed from the vagina.  The patient went to recovery room in stable condition.     [name], M.D.     Florestine Avers  D:  07/15/2011  T:  07/15/2011  Job:  [id number]

## 2011-07-15 NOTE — Anesthesia Postprocedure Evaluation (Signed)
  Anesthesia Post-op Note  Patient: Shannon [name]  Procedure(s) Performed: Procedure(s) (LRB): DILATATION & CURETTAGE/HYSTEROSCOPY WITH THERMACHOICE ABLATION (N/A)  Patient Location: PACU  Anesthesia Type: General  Level of Consciousness: awake  Airway and Oxygen Therapy: Patient Spontanous Breathing and Patient connected to nasal cannula oxygen  Post-op Pain: none  Post-op Assessment: Patient's Cardiovascular Status Stable and Respiratory Function Stable  Post-op Vital Signs: Reviewed and stable  Complications: No apparent anesthesia complications

## 2011-07-15 NOTE — MAU Note (Signed)
Pt reports she had surgery this am, hysteroscopy and D&C. Has had a headache since before the surgery, is worsening now. States she has also had "chest pressure" since before surgery. Feels "short of breath". States her temp at home was 99.4. Had dilaudid at 1730, and med for nausea.

## 2011-07-15 NOTE — MAU Provider Note (Signed)
History     CSN: [id number]  Arrival date and time: 07/15/11 7829   First Provider Initiated Contact with Patient 07/15/11 2036      Chief Complaint  Patient presents with  . Headache  . Abdominal Pain   HPI Shannon [name] is 51 y.o. presents with complaints of headache, chest pressure and abdominal pain following a hysteroscopy/ D&C this morning by Dr. Vincente Poli.  States she had both a headache and was short of breath before and after the procedure which she states they gave her medication for.  Has slept most of the day and when she woke up chest pressure was still there but headache was worse.  Had po dilaudid at 17:30.  Was able to eat crackers but "feels dehydrated".  States she has not voided since this am.     Past Medical History  Diagnosis Date  . Anemia   . Depression   . Eating disorder     anorexia nervosa in her 20's  . Fainting spell   . Kidney stones     passed on their own  . Hx of laminectomy 1994    L4-5  . Stomach disorder     due to complications with cholecystectomy "almost died"  . Anxiety     Past Surgical History  Procedure Date  . Cholecystectomy 08/2009    reports history of gallbladder polyps, she reports stents in duct of lushca   . Appendectomy 1976  . Tonsillectomy 1982  . Laminectomy 1994    L4-5  . Tubal ligation   . Hysteroscopy 07/2011    Family History  Problem Relation Age of Onset  . Hypertension Mother   . Diabetes Father   . Alcohol abuse Maternal Grandmother   . Cancer Paternal Grandfather     lung cancer    History  Substance Use Topics  . Smoking status: Never Smoker   . Smokeless tobacco: Never Used  . Alcohol Use: No    Allergies:  Allergies  Allergen Reactions  . Flexeril (Cyclobenzaprine Hcl)     Mood swings  . Gabapentin     sedation  . Lunesta     Headaches  . Morphine And Related   . Nsaids     Gi bleed  . Paroxetine     Nausea   . Tizanidine Hcl Other (See Comments)    Dizziness, mood swings     Prescriptions prior to admission  Medication Sig Dispense Refill  . clonazePAM (KLONOPIN) 1 MG tablet Take 1 tablet in the morning and lunch and 2 tablets at bedtime.      Marland Kitchen HYDROcodone-acetaminophen (NORCO) 10-325 MG per tablet Take 1 tablet four times daily as needed for back pain.       Marland Kitchen HYDROmorphone (DILAUDID) 4 MG tablet Take 1 tablet (4 mg total) by mouth every 4 (four) hours as needed for pain.  30 tablet  0  . ondansetron (ZOFRAN) 4 MG tablet Take 1 tablet by mouth Twice daily.      . traMADol (ULTRAM) 50 MG tablet Take 50 mg by mouth 3 (three) times daily as needed.        . venlafaxine (EFFEXOR-XR) 75 MG 24 hr capsule Take 2 capsules every morning.         Review of Systems  Constitutional: Negative for fever and chills.  Respiratory: Positive for shortness of breath. Negative for wheezing.   Cardiovascular: Chest pain: tightness.  Gastrointestinal: Positive for abdominal pain.  Neurological: Positive for headaches.  Physical Exam   Blood pressure 110/67, pulse 80, temperature 97.7 F (36.5 C), resp. rate 18, last menstrual period 06/27/2011, SpO2 100.00%.  Physical Exam  Constitutional: She is oriented to person, place, and time. She appears well-developed and well-nourished.  Cardiovascular: Normal rate and normal heart sounds.   Respiratory: Breath sounds normal. No respiratory distress. She has no wheezes. She has no rales.  Neurological: She is alert and oriented to person, place, and time.  Skin: Skin is warm and dry.   Dr. Henderson Cloud in to examine patient   ECG  NSR, possible left atrial enlargement, borderline ECG, rightward axis MAU Course  Procedures  MDM  20:45  Called Dr. Henderson Cloud to report MSE.  Order given for 1 liter of D5LR, get EKG.  To give Toradol 30mg  IV--patient states she is unable to take ANSaids because of stomach ulcers-will not give.       21:20  Reported ECG report to Dr. Henderson Cloud.  Reported patient unable to tolerate ansaids. Order given  for 1 mg Dilaudid IV     22:45  Reported that Dilaudid has not helped her headache.  Dr. Henderson Cloud is coming in to see her.     23:08  Dr. Henderson Cloud in to see patient.      00:55 Dr. Henderson Cloud called, labs reviewed, transfer to [facility] for evaluate headache that preceded procedure.  01:08  Spoke with Dr. Hyacinth Meeker at [facility].  He accepts transfer for evaluation of her headache. 1:15  Reported headache is beginning to "come back" per patient.  She is requesting pain med prior to transfer.  Per Dr. Henderson Cloud, give Dilaudid 1mg  IV.  Assessment and Plan  A:  Headache      Chest tightness       P:  Transfer to [facility] ED for further evaluation of headache.  Jessee Mezera,EVE M 07/15/2011, 8:38 PM

## 2011-07-15 NOTE — Addendum Note (Signed)
Addendum  created 07/15/11 0831 by Abhijay Morriss C Ah Bott, CRNA   Modules edited:Notes Section    

## 2011-07-15 NOTE — Transfer of Care (Signed)
Immediate Anesthesia Transfer of Care Note  Patient: Shannon [name]  Procedure(s) Performed: Procedure(s) (LRB): DILATATION & CURETTAGE/HYSTEROSCOPY WITH THERMACHOICE ABLATION (N/A)  Patient Location: PACU  Anesthesia Type: General  Level of Consciousness: awake and sedated  Airway & Oxygen Therapy: Patient Spontanous Breathing and Patient connected to nasal cannula oxygen  Post-op Assessment: Post -op Vital signs reviewed and stable and Patient moving all extremities  Post vital signs: Reviewed and stable  Complications: No apparent anesthesia complications

## 2011-07-15 NOTE — Brief Op Note (Signed)
07/15/2011  7:57 AM  PATIENT:  Shannon [name]  51 y.o. female  PRE-OPERATIVE DIAGNOSIS:  MENORRHAGIA   POST-OPERATIVE DIAGNOSIS:  Same  PROCEDURE:  Procedure(s) (LRB): DILATATION & CURETTAGE/HYSTEROSCOPY WITH THERMACHOICE ABLATION (N/A)  SURGEON:  Surgeon(s) and Role:    * Jeani Hawking, MD - Primary  PHYSICIAN ASSISTANT:   ASSISTANTS: none   ANESTHESIA:   IV sedation and paracervical block  EBL:     BLOOD ADMINISTERED:none  DRAINS: none   LOCAL MEDICATIONS USED:  XYLOCAINE  and Amount: 10  ml  SPECIMEN:  Source of Specimen:  uterine currettings  DISPOSITION OF SPECIMEN:  PATHOLOGY  COUNTS:  YES  TOURNIQUET:  * No tourniquets in log *  DICTATION: .Other Dictation: Dictation Number [id number]  PLAN OF CARE: Discharge to home after PACU  PATIENT DISPOSITION:  PACU - hemodynamically stable.   Delay start of Pharmacological VTE agent (>24hrs) due to surgical blood loss or risk of bleeding: yes

## 2011-07-15 NOTE — Progress Notes (Signed)
51 yo S/P Hysteroscopy/D&C/EMA this am with Dr Vincente Poli. Patient reports bad HA beginning this morning before surgery. Was relieved with pre op meds. HA continued after surgery. Pt went home and slept and then awoke with continued HA-rates 10/10. Took dilaudid 2 mg x 1 with no relief. Poor apetite-little to eat or drink. States no urination today after going home. No nausea/vomiting, no abdominal pain, no fever/chills.C/O sensitivity to light. No history of HA/migraine. Hx of back/neck surgery and depression/panic disorder. Takes hydrocodone prn, Effexor, and Klonopin prn anxiety. Has not missed any meds. Here at [facility] MAU was given IV fluids, IV dilaudid 1mg  with no help-HA continues.  Blood pressure 110/67, pulse 80, temperature 97.7 F (36.5 C), resp. rate 18, last menstrual period 06/27/2011, SpO2 100.00%.  Pt wearing sunglasses, oriented x 3  Neck supple, pupils equal   Skin W&D Lungs CTA Cor RRR Abdomen soft NT  EKG NSR, poss left atrial enlargement, borderline ECG  A: new onset of severe HA antedating surgery this am     No evidence of post operative complication at this time     Will check CBC/CMET , then transfer to ED for HA evaluation.

## 2011-07-15 NOTE — Progress Notes (Signed)
History and physical on the chart No significant changes Allergies noted Will proceed with D and C, Hysteroscopy, Thermachoice

## 2011-07-15 NOTE — Anesthesia Preprocedure Evaluation (Signed)

## 2011-07-15 NOTE — Addendum Note (Signed)
Addendum  created 07/15/11 0920 by Kerby Nora, MD   Modules edited:Orders

## 2011-07-15 NOTE — Discharge Instructions (Signed)
Hysteroscopy Hysteroscopy is a procedure used for looking inside the womb (uterus). It may be done for many different reasons, including:  To evaluate abnormal bleeding, fibroid (benign, noncancerous) tumors, polyps, scar tissue (adhesions), and possibly cancer of the uterus.   To look for lumps (tumors) and other uterine growths.   To look for causes of why a woman cannot get pregnant (infertility), causes of recurrent loss of pregnancy (miscarriages), or a lost intrauterine device (IUD).   To perform a sterilization by blocking the fallopian tubes from inside the uterus.  A hysteroscopy should be done right after a menstrual period to be sure you are not pregnant. LET YOUR CAREGIVER KNOW ABOUT:   Allergies.   Medicines taken, including herbs, eyedrops, over-the-counter medicines, and creams.   Use of steroids (by mouth or creams).   Previous problems with anesthetics or numbing medicines.   History of bleeding or blood problems.   History of blood clots.   Possibility of pregnancy, if this applies.   Previous surgery.   Other health problems.  RISKS AND COMPLICATIONS   Putting a hole in the uterus.   Excessive bleeding.   Infection.   Damage to the cervix.   Injury to other organs.   Allergic reaction to medicines.   Too much fluid used in the uterus for the procedure.  BEFORE THE PROCEDURE   Do not take aspirin or blood thinners for a week before the procedure, or as directed. It can cause bleeding.   Arrive at least 60 minutes before the procedure or as directed to read and sign the necessary forms.   Arrange for someone to take you home after the procedure.   If you smoke, do not smoke for 2 weeks before the procedure.  PROCEDURE   Your caregiver may give you medicine to relax you. He or she may also give you a medicine that numbs the area around the cervix (local anesthetic) or a medicine that makes you sleep (general anesthesia).   Sometimes, a  medicine is placed in the cervix the day before the procedure. This medicine makes the cervix have a larger opening (dilate). This makes it easier for the instrument to be inserted into the uterus.   A small instrument (hysteroscope) is inserted through the vagina into the uterus. This instrument is similar to a pencil-sized telescope with a light.   During the procedure, air or a liquid is put into the uterus, which allows the surgeon to see better.   Sometimes, tissue is gently scraped from inside the uterus. These tissue samples are sent to a specialist who looks at tissue samples (pathologist). The pathologist will give a report to your caregiver. This will help your caregiver decide if further treatment is necessary. The report will also help your caregiver decide on the best treatment if the test comes back abnormal.  AFTER THE PROCEDURE   If you had a general anesthetic, you may be groggy for a couple hours after the procedure.   If you had a local anesthetic, you will be advised to rest at the surgical center or caregiver's office until you are stable and feel ready to go home.   You may have some cramping for a couple days.   You may have bleeding, which varies from light spotting for a few days to menstrual-like bleeding for up to 3 to 7 days. This is normal.   Have someone take you home.  FINDING OUT THE RESULTS OF YOUR TEST Not all test results   are available during your visit. If your test results are not back during the visit, make an appointment with your caregiver to find out the results. Do not assume everything is normal if you have not heard from your caregiver or the medical facility. It is important for you to follow up on all of your test results. HOME CARE INSTRUCTIONS   Do not drive for 24 hours or as instructed.   Only take over-the-counter or prescription medicines for pain, discomfort, or fever as directed by your caregiver.   Do not take aspirin. It can cause or  aggravate bleeding.   Do not drive or drink alcohol while taking pain medicine.   You may resume your usual diet.   Do not use tampons, douche, or have sexual intercourse for 2 weeks, or as advised by your caregiver.   Rest and sleep for the first 24 to 48 hours.   Take your temperature twice a day for 4 to 5 days. Write it down. Give these temperatures to your caregiver if they are abnormal (above 98.6 F or 37.0 C).   Take medicines your caregiver has ordered as directed.   Follow your caregiver's advice regarding diet, exercise, lifting, driving, and general activities.   Take showers instead of baths for 2 weeks, or as recommended by your caregiver.   If you develop constipation:   Take a mild laxative with the advice of your caregiver.   Eat bran foods.   Drink enough water and fluids to keep your urine clear or pale yellow.   Try to have someone with you or available to you for the first 24 to 48 hours, especially if you had a general anesthetic.   Make sure you and your family understand everything about your operation and recovery.   Follow your caregiver's advice regarding follow-up appointments and Pap smears.  SEEK MEDICAL CARE IF:   You feel dizzy or lightheaded.   You feel sick to your stomach (nauseous).   You develop abnormal vaginal discharge.   You develop a rash.   You have an abnormal reaction or allergy to your medicine.   You need stronger pain medicine.  SEEK IMMEDIATE MEDICAL CARE IF:   Bleeding is heavier than a normal menstrual period or you have blood clots.   You have an oral temperature above 102 F (38.9 C), not controlled by medicine.   You have increasing cramps or pains not relieved with medicine.   You develop belly (abdominal) pain that does not seem to be related to the same area of earlier cramping and pain.   You pass out.   You develop pain in the tops of your shoulders (shoulder strap areas).   You develop shortness of  breath.  MAKE SURE YOU:   Understand these instructions.   Will watch your condition.   Will get help right away if you are not doing well or get worse.  Document Released: 07/26/2000 Document Revised: 04/08/2011 Document Reviewed: 11/18/2008 [facility] Patient Information 2012 [place], [place].Endometrial Ablation Endometrial ablation removes the lining of the uterus (endometrium). It is usually a same day, outpatient treatment. Ablation helps avoid major surgery (such as a hysterectomy). A hysterectomy is removal of the cervix and uterus. Endometrial ablation has less risk and complications, has a shorter recovery period and is less expensive. After endometrial ablation, most women will have little or no menstrual bleeding. You may not keep your fertility. Pregnancy is no longer likely after this procedure but if you are pre-menopausal,  you still need to use a reliable method of birth control following the procedure because pregnancy can occur. REASONS TO HAVE THE PROCEDURE MAY INCLUDE:  Heavy periods.   Bleeding that is causing anemia.   Anovulatory bleeding, very irregular, bleeding.   Bleeding submucous fibroids (on the lining inside the uterus) if they are smaller than 3 centimeters.  REASONS NOT TO HAVE THE PROCEDURE MAY INCLUDE:  You wish to have more children.   You have a pre-cancerous or cancerous problem. The cause of any abnormal bleeding must be diagnosed before having the procedure.   You have pain coming from the uterus.   You have a submucus fibroid larger than 3 centimeters.   You recently had a baby.   You recently had an infection in the uterus.   You have a severe retro-flexed, tipped uterus and cannot insert the instrument to do the ablation.   You had a Cesarean section or deep major surgery on the uterus.   The inner cavity of the uterus is too large for the endometrial ablation instrument.  RISKS AND COMPLICATIONS   Perforation of the uterus.    Bleeding.   Infection of the uterus, bladder or vagina.   Injury to surrounding organs.   Cutting the cervix.   An air bubble to the lung (air embolus).   Pregnancy following the procedure.   Failure of the procedure to help the problem requiring hysterectomy.   Decreased ability to diagnose cancer in the lining of the uterus.  BEFORE THE PROCEDURE  The lining of the uterus must be tested to make sure there is no pre-cancerous or cancer cells present.   Medications may be given to make the lining of the uterus thinner.   Ultrasound may be used to evaluate the size and look for abnormalities of the uterus.   Future pregnancy is not desired.  PROCEDURE  There are different ways to destroy the lining of the uterus.   Resectoscope - radio frequency-alternating electric current is the most common one used.   Cryotherapy - freezing the lining of the uterus.   Heated Free Liquid - heated salt (saline) solution inserted into the uterus.   Microwave - uses high energy microwaves in the uterus.   Thermal Balloon - a catheter with a balloon tip is inserted into the uterus and filled with heated fluid.  Your caregiver will talk with you about the method used in this clinic. They will also instruct you on the pros and cons of the procedure. Endometrial ablation is performed along with a procedure called operative hysteroscopy. A narrow viewing tube is inserted through the birth canal (vagina) and through the cervix into the uterus. A tiny camera attached to the viewing tube (hysteroscope) allows the uterine cavity to be shown on a TV monitor during surgery. Your uterus is filled with a harmless liquid to make the procedure easier. The lining of the uterus is then removed. The lining can also be removed with a resectoscope which allows your surgeon to cut away the lining of the uterus under direct vision. Usually, you will be able to go home within an hour after the procedure. HOME CARE  INSTRUCTIONS   Do not drive for 24 hours.   No tampons, douching or intercourse for 2 weeks or until your caregiver approves.   Rest at home for 24 to 48 hours. You may then resume normal activities unless told differently by your caregiver.   Take your temperature two times a day for  4 days, and record it.   Take any medications your caregiver has ordered, as directed.   Use some form of contraception if you are pre-menopausal and do not want to get pregnant.  Bleeding after the procedure is normal. It varies from light spotting and mildly watery to bloody discharge for 4 to 6 weeks. You may also have mild cramping. Only take over-the-counter or prescription medicines for pain, discomfort, or fever as directed by your caregiver. Do not use aspirin, as this may aggravate bleeding. Frequent urination during the first 24 hours is normal. You will not know how effective your surgery is until at least 3 months after the surgery. SEEK IMMEDIATE MEDICAL CARE IF:   Bleeding is heavier than a normal menstrual cycle.   An oral temperature above 102 F (38.9 C) develops.   You have increasing cramps or pains not relieved with medication or develop belly (abdominal) pain which does not seem to be related to the same area of earlier cramping and pain.   You are light headed, weak or have fainting episodes.   You develop pain in the shoulder strap areas.   You have chest or leg pain.   You have abnormal vaginal discharge.   You have painful urination.  Document Released: 02/27/2004 Document Revised: 04/08/2011 Document Reviewed: 05/27/2007 [facility] Patient Information 2012 [place], [place].Dilation and Curettage or Vacuum Curettage Care After Refer to this sheet in the next few weeks. These instructions provide you with general information on caring for yourself after your procedure. Your caregiver may also give you more specific instructions. Your treatment has been planned according to current  medical practices, but problems sometimes occur. Call your caregiver if you have any problems or questions after your procedure. HOME CARE INSTRUCTIONS   Do not drive for 24 hours.   Wait 1 week before returning to strenuous activities.   Take your temperature 2 times a day for 4 days and write it down. Provide these temperatures to your caregiver if you develop a fever.   Avoid long periods of standing, and do no heavy lifting (more than 10 pounds or 4.5 kg), pushing, or pulling.   Limit stair climbing to once or twice a day.   Take rest periods often.   You may resume your usual diet.   Drink enough fluids to keep your urine clear or pale yellow.   You should return to your usual bowel function. If constipation should occur, you may:   Take a mild laxative with permission from your caregiver.   Add fruit and bran to your diet.   Drink more fluids.   Take showers instead of baths until your caregiver gives you permission to take baths.   Do not go swimming or use a hot tub until your caregiver gives you permission.   Try to have someone with you or available to you the first 24 to 48 hours, especially if you had a general anesthetic.   Do not douche, use tampons, or have intercourse until after your follow-up appointment, or when your caregiver approves.   Only take over-the-counter or prescription medicines for pain, discomfort, or fever as directed by your caregiver. Do not take aspirin. It can cause bleeding.   If a prescription was given, follow your caregiver's directions.   Keep all your follow-up appointments recommended by your caregiver.  SEEK MEDICAL CARE IF:   You have increasing cramps or pain not relieved with medicine.   You have abdominal pain which does not seem  to be related to the same area of earlier cramping and pain.   You have bad smelling vaginal discharge.   You have a rash.   You have problems with any medicine.  SEEK IMMEDIATE MEDICAL CARE  IF:   You have bleeding that is heavier than a normal menstrual period.   You have a fever.   You have chest pain.   You have shortness of breath.   You feel dizzy or feel like fainting.   You pass out.   You have pain in your shoulder strap area.   You have heavy vaginal bleeding with or without blood clots.  MAKE SURE YOU:   Understand these instructions.   Will watch your condition.   Will get help right away if you are not doing well or get worse.  Document Released: 04/16/2000 Document Revised: 04/08/2011 Document Reviewed: 11/14/2008 [facility] Patient Information 2012 [employer] [place], [place].

## 2011-07-15 NOTE — Anesthesia Postprocedure Evaluation (Signed)
  Anesthesia Post-op Note  Patient: Shannon [name]  Procedure(s) Performed: Procedure(s) (LRB): DILATATION & CURETTAGE/HYSTEROSCOPY WITH THERMACHOICE ABLATION (N/A)  Patient Location: PACU  Anesthesia Type: General  Level of Consciousness: awake, alert  and oriented  Airway and Oxygen Therapy: Patient Spontanous Breathing  Post-op Pain: none  Post-op Assessment: Post-op Vital signs reviewed, Patient's Cardiovascular Status Stable, Respiratory Function Stable, Patent Airway, No signs of Nausea or vomiting and Pain level controlled  Post-op Vital Signs: Reviewed and stable  Complications: No apparent anesthesia complications

## 2011-07-16 ENCOUNTER — Encounter: Payer: BC Managed Care – PPO | Admitting: Family

## 2011-07-16 ENCOUNTER — Emergency Department (HOSPITAL_COMMUNITY): Payer: BC Managed Care – PPO

## 2011-07-16 ENCOUNTER — Encounter (HOSPITAL_COMMUNITY): Payer: Self-pay | Admitting: Radiology

## 2011-07-16 ENCOUNTER — Emergency Department (HOSPITAL_COMMUNITY)
Admission: EM | Admit: 2011-07-16 | Discharge: 2011-07-16 | Disposition: A | Discharge status: Home / Self Care | Payer: BC Managed Care – PPO | Attending: Emergency Medicine | Admitting: Emergency Medicine

## 2011-07-16 DIAGNOSIS — R51 Headache: Secondary | ICD-10-CM

## 2011-07-16 MED ORDER — OXYCODONE-ACETAMINOPHEN 5-325 MG PO TABS: 1.0000 | ORAL_TABLET | ORAL | Status: AC | PRN

## 2011-07-16 MED ORDER — HYDROMORPHONE HCL PF 1 MG/ML IJ SOLN
1.0000 mg | Freq: Once | INTRAMUSCULAR | Status: AC
  Administered 2011-07-16: 1 mg via INTRAVENOUS
  Filled 2011-07-16: qty 1

## 2011-07-16 MED ORDER — SODIUM CHLORIDE 0.9 % IJ SOLN
INTRAMUSCULAR | Status: AC
  Filled 2011-07-16: qty 3

## 2011-07-16 NOTE — ED Notes (Signed)
In by [facility]   From [facility]  With c/o headache

## 2011-07-16 NOTE — MAU Note (Signed)
Pt agreed to transfer to [facility] continued evaluation.

## 2011-07-16 NOTE — Discharge Instructions (Signed)
Your CT scan was normal showing no signs of bleeding, tumors or other abnormalities.  Headache:  You are having a headache. No specific cause was found today for your headache. It may have been a migraine or other cause of headache. Stress, anxiety, fatigue, and depression are common triggers for headaches. Your headache today does not appear to be life-threatening or require hospitalization, but often the exact cause of headaches is not determined in the emergency department. Therefore, followup with your doctor is very important to find out what may have caused your headache, and whether or not you need any further diagnostic testing or treatment. Sometimes headaches can appear benign but then more serious symptoms can develop which should prompt an immediate reevaluation by your doctor or the emergency department.  Seek immediate medical attention if:   You develop possible problems with medications prescribed.  The medications don't resolve your headache, if it recurs, or if you have multiple episodes of vomiting or can't take fluids by mouth  You have a change from the usual headache.  If you developed a sudden severe headache or confusion, become poorly responsive or faint, developed a fever above 100.4 or problems breathing, have a change in speech, vision, swallowing or understanding, or developed new weakness, numbness, tingling, incoordination or have a seizure.  If you don't have a family doctor to follow up with, see the follow up list below - call this morning for a follow-up appointment in the next 1-2 days.  RESOURCE GUIDE  Dental Problems  Patients with Medicaid: [facility] [phone]                                           [phone] [address] [employer] [address] Phone:  [phone]                                                  Phone:  [phone]  If unable to pay or uninsured, contact:  [facility] or [facility] to become qualified for the adult dental clinic.  Chronic Pain Problems Contact [facility]  [phone] Patients need to be referred by their primary care doctor.  Insufficient Money for Medicine Contact United Way:  call "211" or [facility] [phone].  No Primary Care Doctor Call [facility]  [phone] Other agencies that provide inexpensive medical care    [facility]  [phone]    [facility]  [phone]    [facility]  [phone]    [facility]  [phone]    [facility]  [phone]    [facility]  [phone]  Psychological Services [facility] Behavioral Health  [phone] [facility]  [phone] [facility] Mental Health   [phone] (emergency services [phone])  Substance Abuse Resources [facility]  [phone] [facility] 501-164-7996 [facility] [place] [phone] Floydene Flock [phone] Residential & Outpatient Substance Abuse Program  [phone]  Abuse/Neglect [facility] Child Abuse Hotline 229-230-0824 [facility] Child Abuse Hotline 404-395-7931 (After Hours)  Emergency Shelter [facility] [phone]  Maternity Homes [facility] [phone] [facility] [phone]  MRSA Hotline #:   361-354-9674    [facility] Resources  [facility]     United [employer]                          [facility] Dept. [address] [place]                       [address]      [address]  Blondell Reveal Phone:  [phone]                                   Phone:  [phone]                 Phone:  [phone]  [facility] Mental Health Phone:  [phone]  [facility] Child Abuse Hotline [phone] [phone] (After Hours)

## 2011-07-16 NOTE — MAU Note (Signed)
Received report of pt complaining of headache. Pt resting in hallway on stretcher. Dr Henderson Cloud on his way to see pt.

## 2011-07-16 NOTE — ED Provider Notes (Signed)
History     CSN: [id number]  Arrival date & time 07/16/11  0218   First MD Initiated Contact with Patient 07/16/11 0235      Chief Complaint  Patient presents with  . Headache    (Consider location/radiation/quality/duration/timing/severity/associated sxs/prior treatment) HPI Comments: 51 year old female with a history of anxiety attacks, heavy monthly vaginal bleeding for which she was seen in the last 18 hours at the [facility] hospital for a dilation and curettage procedure. She states that yesterday while she was swimming in the swimming pool she had a noxious smell that started to give her a dull headache. This headache persisted throughout the day yesterday and prior to her procedure earlier in the day 18 hours ago she developed a severe frontal headache. This has been persistent through much of the day, standing seems to worsen the pain, not associated with stiff neck or fever and improved with hydromorphone given in her procedure. She was seen again at the [facility] hospital and referred to this hospital for further evaluation of her headache. She denies having headaches in the past, has no other complaints including weakness, numbness, ataxia, change in vision. She does endorse photophobia and nausea.  She does admit that she gets frequent anxiety attacks and had one in her preop session 2 days ago. Over the last 24 hours she has also had a mild pressure in her chest which she states makes it difficult for her to talk, states that she gets tired easily when she talks.  Patient is a 51 y.o. female presenting with headaches. The history is provided by the patient and medical records.  Headache     Past Medical History  Diagnosis Date  . Anemia   . Depression   . Eating disorder     anorexia nervosa in her 20's  . Fainting spell   . Kidney stones     passed on their own  . Hx of laminectomy 1994    L4-5  . Stomach disorder     due to complications with cholecystectomy "almost died"   . Anxiety     Past Surgical History  Procedure Date  . Cholecystectomy 08/2009    reports history of gallbladder polyps, she reports stents in duct of lushca   . Appendectomy 1976  . Tonsillectomy 1982  . Laminectomy 1994    L4-5  . Tubal ligation   . Hysteroscopy 07/2011    Family History  Problem Relation Age of Onset  . Hypertension Mother   . Diabetes Father   . Alcohol abuse Maternal Grandmother   . Cancer Paternal Grandfather     lung cancer    History  Substance Use Topics  . Smoking status: Never Smoker   . Smokeless tobacco: Never Used  . Alcohol Use: No    OB History    Grav Para Term Preterm Abortions TAB SAB Ect Mult Living                  Review of Systems  Neurological: Positive for headaches.  All other systems reviewed and are negative.    Allergies  Flexeril; Gabapentin; Lunesta; Morphine and related; Nsaids; Paroxetine; Shellfish allergy; and Tizanidine hcl  Home Medications   Current Outpatient Rx  Name Route Sig Dispense Refill  . CLONAZEPAM 1 MG PO TABS Oral Take 2 mg by mouth 3 (three) times daily. Take 1 tablet in the morning and lunch and 2 tablets at bedtime.    Marland Kitchen HYDROCODONE-ACETAMINOPHEN 10-325 MG PO TABS  Take  1 tablet four times daily as needed for back pain.     Marland Kitchen HYDROMORPHONE HCL 4 MG PO TABS Oral Take 1 tablet (4 mg total) by mouth every 4 (four) hours as needed for pain. 30 tablet 0  . ONDANSETRON HCL 4 MG PO TABS Oral Take 1 tablet by mouth Twice daily.    . VENLAFAXINE HCL ER 75 MG PO CP24  Take 2 capsules every morning.     . OXYCODONE-ACETAMINOPHEN 5-325 MG PO TABS Oral Take 1 tablet by mouth every 4 (four) hours as needed for pain. May take 2 tablets PO q 6 hours for severe pain - Do not take with Tylenol as this tablet already contains tylenol 15 tablet 0    BP 102/60  Temp(Src) 97.7 F (36.5 C) (Oral)  Resp 18  SpO2 93%  LMP 06/27/2011  Physical Exam  Nursing note and vitals reviewed. Constitutional: She  appears well-developed and well-nourished. No distress.  HENT:  Head: Normocephalic and atraumatic.  Mouth/Throat: Oropharynx is clear and moist. No oropharyngeal exudate.  Eyes: Conjunctivae and EOM are normal. Pupils are equal, round, and reactive to light. Right eye exhibits no discharge. Left eye exhibits no discharge. No scleral icterus.  Neck: Normal range of motion. Neck supple. No JVD present. No thyromegaly present.  Cardiovascular: Normal rate, regular rhythm, normal heart sounds and intact distal pulses.  Exam reveals no gallop and no friction rub.   No murmur heard. Pulmonary/Chest: Effort normal and breath sounds normal. No respiratory distress. She has no wheezes. She has no rales.  Abdominal: Soft. Bowel sounds are normal. She exhibits no distension and no mass. There is no tenderness.  Musculoskeletal: Normal range of motion. She exhibits no edema and no tenderness.  Lymphadenopathy:    She has no cervical adenopathy.  Neurological: She is alert. Coordination normal.       Neurologic exam:  Speech clear, pupils equal round reactive to light, extraocular movements intact  Normal peripheral visual fields Cranial nerves III through XII normal including no facial droop Follows commands, moves all extremities x4, normal strength to bilateral upper and lower extremities at all major muscle groups including grip Sensation normal to light touch and pinprick Coordination intact, no limb ataxia, finger-nose-finger normal Rapid alternating movements normal No pronator drift Gait normal   Skin: Skin is warm and dry. No rash noted. No erythema.  Psychiatric: She has a normal mood and affect. Her behavior is normal.    ED Course  Procedures (including critical care time)  Labs Reviewed - No data to display Ct Head Wo Contrast  07/16/2011  *RADIOLOGY REPORT*  Clinical Data: Headache  CT HEAD WITHOUT CONTRAST  Technique:  Contiguous axial images were obtained from the base of the  skull through the vertex without contrast.  Comparison: None.  Findings: There is no evidence for acute hemorrhage, hydrocephalus, mass lesion, or abnormal extra-axial fluid collection.  No definite CT evidence for acute infarction.  The visualized paranasal sinuses and mastoid air cells are predominately clear.  IMPRESSION: No acute intracranial abnormality identified.  Original Report Authenticated By: Waneta Martins, M.D.     1. Headache       MDM  Patient appears to be in some discomfort though has normal vital signs, no stiffness to her neck, neurologic exam which is normal including speech, strength, sensation and coordination. Pupillary exam is normal and extraocular movements are intact.  CT scan ordered to rule out intracranial pathology,  either more phone.   Patient  has improved significantly after Intravenous medications, CT scan was negative for any acute findings, I have explained these findings to the patient and have encouraged her to followup with her family Dr. She has 0/10 pain at this time.  Vida Roller, MD 07/16/11 [phone]

## 2011-09-18 ENCOUNTER — Encounter (HOSPITAL_BASED_OUTPATIENT_CLINIC_OR_DEPARTMENT_OTHER): Payer: Self-pay | Admitting: *Deleted

## 2011-09-18 ENCOUNTER — Emergency Department (HOSPITAL_BASED_OUTPATIENT_CLINIC_OR_DEPARTMENT_OTHER)
Admission: EM | Admit: 2011-09-18 | Discharge: 2011-09-18 | Disposition: A | Discharge status: Home / Self Care | Payer: BC Managed Care – PPO | Attending: Emergency Medicine | Admitting: Emergency Medicine

## 2011-09-18 DIAGNOSIS — R51 Headache: Secondary | ICD-10-CM

## 2011-09-18 DIAGNOSIS — G43909 Migraine, unspecified, not intractable, without status migrainosus: Secondary | ICD-10-CM | POA: Insufficient documentation

## 2011-09-18 DIAGNOSIS — F411 Generalized anxiety disorder: Secondary | ICD-10-CM | POA: Insufficient documentation

## 2011-09-18 MED ORDER — KETOROLAC TROMETHAMINE 30 MG/ML IJ SOLN
30.0000 mg | Freq: Once | INTRAMUSCULAR | Status: AC
  Administered 2011-09-18: 30 mg via INTRAMUSCULAR
  Filled 2011-09-18: qty 1

## 2011-09-18 MED ORDER — SUMATRIPTAN SUCCINATE 6 MG/0.5ML ~~LOC~~ SOLN
6.0000 mg | Freq: Once | SUBCUTANEOUS | Status: AC
  Administered 2011-09-18: 6 mg via SUBCUTANEOUS
  Filled 2011-09-18: qty 0.5

## 2011-09-18 MED ORDER — PROMETHAZINE HCL 25 MG/ML IJ SOLN
25.0000 mg | Freq: Once | INTRAMUSCULAR | Status: AC
  Administered 2011-09-18: 25 mg via INTRAMUSCULAR
  Filled 2011-09-18: qty 1

## 2011-09-18 MED ORDER — HYDROMORPHONE HCL PF 1 MG/ML IJ SOLN
1.0000 mg | Freq: Once | INTRAMUSCULAR | Status: AC
  Administered 2011-09-18: 1 mg via INTRAMUSCULAR
  Filled 2011-09-18: qty 1

## 2011-09-18 NOTE — Discharge Instructions (Signed)
  Rest. Drink plenty of fluids. Take your medication as need.  Follow up with your doctor/neurologist Monday for recheck if symptoms fail to improve/resolve. Return to ER if worse, severe pain, persistent vomiting, fevers, other concern.    You were given pain medication in the ER - no driving for the next 6 hours.     Migraine Headache A migraine headache is an intense, throbbing pain on one or both sides of your head. The exact cause of a migraine headache is not always known. A migraine may be caused when nerves in the brain become irritated and release chemicals that cause swelling within blood vessels, causing pain. Many migraine sufferers have a family history of migraines. Before you get a migraine you may or may not get an aura. An aura is a group of symptoms that can predict the beginning of a migraine. An aura may include:  Visual changes such as:   Flashing lights.   Bright spots or zig-zag lines.   Tunnel vision.   Feelings of numbness.   Trouble talking.   Muscle weakness.  SYMPTOMS  Pain on one or both sides of your head.   Pain that is pulsating or throbbing in nature.   Pain that is severe enough to prevent daily activities.   Pain that is aggravated by any daily physical activity.   Nausea (feeling sick to your stomach), vomiting, or both.   Pain with exposure to bright lights, loud noises, or activity.   General sensitivity to bright lights or loud noises.  MIGRAINE TRIGGERS Examples of triggers of migraine headaches include:   Alcohol.   Smoking.   Stress.   It may be related to menses (female menstruation).   Aged cheeses.   Foods or drinks that contain nitrates, glutamate, aspartame, or tyramine.   Lack of sleep.   Chocolate.   Caffeine.   Hunger.   Medications such as nitroglycerine (used to treat chest pain), birth control pills, estrogen, and some blood pressure medications.  DIAGNOSIS  A migraine headache is often diagnosed  based on:  Symptoms.   Physical examination.   A computerized X-ray scan (computed tomography, CT) of your head.  TREATMENT  Medications can help prevent migraines if they are recurrent or should they become recurrent. Your caregiver can help you with a medication or treatment program that will be helpful to you.   Lying down in a dark, quiet room may be helpful.   Keeping a headache diary may help you find a trend as to what may be triggering your headaches.  SEEK IMMEDIATE MEDICAL CARE IF:   You have confusion, personality changes or seizures.   You have headaches that wake you from sleep.   You have an increased frequency in your headaches.   You have a stiff neck.   You have a loss of vision.   You have muscle weakness.   You start losing your balance or have trouble walking.   You feel faint or pass out.  MAKE SURE YOU:   Understand these instructions.   Will watch your condition.   Will get help right away if you are not doing well or get worse.  Document Released: 04/19/2005 Document Revised: 04/08/2011 Document Reviewed: 12/03/2008 [facility] Patient Information 2012 [place], [place].

## 2011-09-18 NOTE — ED Provider Notes (Signed)
History     CSN: [id number]  Arrival date & time 09/18/11  1455   First MD Initiated Contact with Patient 09/18/11 1603      Chief Complaint  Patient presents with  . Migraine    (Consider location/radiation/quality/duration/timing/severity/associated sxs/prior treatment) HPI  Past Medical History  Diagnosis Date  . Anemia   . Depression   . Eating disorder     anorexia nervosa in her 20's  . Fainting spell   . Kidney stones     passed on their own  . Hx of laminectomy 1994    L4-5  . Stomach disorder     due to complications with cholecystectomy "almost died"  . Anxiety     Past Surgical History  Procedure Date  . Cholecystectomy 08/2009    reports history of gallbladder polyps, she reports stents in duct of lushca   . Appendectomy 1976  . Tonsillectomy 1982  . Laminectomy 1994    L4-5  . Tubal ligation   . Hysteroscopy 07/2011    Family History  Problem Relation Age of Onset  . Hypertension Mother   . Diabetes Father   . Alcohol abuse Maternal Grandmother   . Cancer Paternal Grandfather     lung cancer    History  Substance Use Topics  . Smoking status: Never Smoker   . Smokeless tobacco: Never Used  . Alcohol Use: No    OB History    Grav Para Term Preterm Abortions TAB SAB Ect Mult Living                  Review of Systems  Allergies  Eszopiclone; Flexeril; Gabapentin; Morphine and related; Nsaids; Paroxetine; Shellfish allergy; and Tizanidine hcl  Home Medications   Current Outpatient Rx  Name Route Sig Dispense Refill  . CLONAZEPAM 1 MG PO TABS Oral Take 1-2 mg by mouth 3 (three) times daily. Take 1 tablet in the morning and lunch and 2 tablets at bedtime.    Marland Kitchen HYDROCODONE-ACETAMINOPHEN 10-325 MG PO TABS  Take 1 tablet four times daily as needed for back pain.     Marland Kitchen ONDANSETRON HCL 4 MG PO TABS Oral Take 1 tablet by mouth Twice daily.    . VENLAFAXINE HCL ER 75 MG PO CP24  Take 2 capsules every morning.       BP 104/66  Pulse 78   Temp(Src) 97.6 F (36.4 C) (Oral)  Resp 16  Ht 5\' 6"  (1.676 m)  Wt 130 lb (58.968 kg)  BMI 20.98 kg/m2  SpO2 98%  LMP 09/11/2011  Physical Exam  ED Course  Procedures (including critical care time)    MDM  Pt requests toradol, demerol and phenergan im - pt states this combination works well and that is what her neurologist, dr Fredonia Highland from [place] has used w her w good result in past.  Explained to pt that we have dilaudid, but not demerol, pt states that is fine.  Dilaudid 1 mg im, phenergan 25 mg im, toradol 30 im.   Recheck pain persists but better.  Pt denies hx cad, no recent cp or discomfort. imitrex sq.  Recheck pt more comfortable, will follow up with her doctor/neurologist.         Suzi Roots, MD 09/20/11 [phone]

## 2011-09-18 NOTE — ED Notes (Signed)
Water given to patient and family.

## 2011-09-18 NOTE — ED Notes (Signed)
Pt states she has a hx of migraines and normally gets a shot of Phenergan, Toradol and Demerol for same.

## 2011-09-18 NOTE — ED Provider Notes (Signed)
History   This chart was scribed for Suzi Roots, MD by Shari Heritage. The patient was seen in room MH09/MH09. Patient's care was started at 1455.     CSN: [id number]  Arrival date & time 09/18/11  1455   First MD Initiated Contact with Patient 09/18/11 1603      Chief Complaint  Patient presents with  . Migraine    (Consider location/radiation/quality/duration/timing/severity/associated sxs/prior treatment) The history is provided by the patient. No language interpreter was used.   Shannon [name] is a 51 y.o. female who presents to the Emergency Department complaining of moderate to severe migraine headache onset several hours ago. Patient claims pain is always worse on the right. Patient describes the pain as constant and is worsened by light, noise and opening her eyes. Patient has a history of migraines and currently sees a neurologist, Dr. Albertina Senegal. Dr. Albertina Senegal administered shots of Phenergan, Toradol and Demerol during patient's visit a few days ago. Patient's migraine condition is recent. Claims she had no history of migraines until a couple of months ago. Patient has a surgical ablation in March. Patient said when her first migraine occurred, she went to [facility] and got a CAT scan (March 2013). Patient denies numbness, weakness, sinus pain, drainage, neck pain, temple pain. Patient reports no fever or chills. Patient has a herniated disc and has fallen recently. Patient with h/o anemia, kidney stones, anxiety, cholecystectomy, appendectomy, tubal ligation and hysteroscopy.  Pt states same headache several times in past 2-3 months. Current headache gradual onset, slowly worse, same as priors. Not a worst headache. Ct 07/2011 for same type of headache negative. Pt denies head trauma or injury. No eye pain, pain w eom, or visual loss. No neck pain or stiffness. No sinus pain or drainage. No fever or chills. No change in speech, or vision. No numbness/weakness. No dizziness.    Neurologist - Albertina Senegal [facility]) Past Medical History  Diagnosis Date  . Anemia   . Depression   . Eating disorder     anorexia nervosa in her 20's  . Fainting spell   . Kidney stones     passed on their own  . Hx of laminectomy 1994    L4-5  . Stomach disorder     due to complications with cholecystectomy "almost died"  . Anxiety     Past Surgical History  Procedure Date  . Cholecystectomy 08/2009    reports history of gallbladder polyps, she reports stents in duct of lushca   . Appendectomy 1976  . Tonsillectomy 1982  . Laminectomy 1994    L4-5  . Tubal ligation   . Hysteroscopy 07/2011    Family History  Problem Relation Age of Onset  . Hypertension Mother   . Diabetes Father   . Alcohol abuse Maternal Grandmother   . Cancer Paternal Grandfather     lung cancer    History  Substance Use Topics  . Smoking status: Never Smoker   . Smokeless tobacco: Never Used  . Alcohol Use: No    OB History    Grav Para Term Preterm Abortions TAB SAB Ect Mult Living                  Review of Systems  Constitutional: Negative for fever and chills.  HENT: Negative for rhinorrhea, neck pain, neck stiffness and sinus pressure.   Eyes: Positive for photophobia. Negative for pain and visual disturbance.  Respiratory: Negative for shortness of breath.   Cardiovascular: Negative for chest  pain.  Gastrointestinal: Negative for vomiting and abdominal pain.  Genitourinary: Negative for flank pain.  Musculoskeletal: Negative for back pain.  Neurological: Positive for headaches. Negative for dizziness, syncope, speech difficulty, weakness and numbness.  Hematological: Negative for adenopathy.  Psychiatric/Behavioral: Negative for agitation.    Allergies  Eszopiclone; Flexeril; Gabapentin; Morphine and related; Nsaids; Paroxetine; Shellfish allergy; and Tizanidine hcl  Home Medications   Current Outpatient Rx  Name Route Sig Dispense Refill  . CLONAZEPAM 1 MG PO  TABS Oral Take 1-2 mg by mouth 3 (three) times daily. Take 1 tablet in the morning and lunch and 2 tablets at bedtime.    Marland Kitchen HYDROCODONE-ACETAMINOPHEN 10-325 MG PO TABS  Take 1 tablet four times daily as needed for back pain.     Marland Kitchen ONDANSETRON HCL 4 MG PO TABS Oral Take 1 tablet by mouth Twice daily.    . VENLAFAXINE HCL ER 75 MG PO CP24  Take 2 capsules every morning.       BP 104/66  Pulse 78  Temp(Src) 97.6 F (36.4 C) (Oral)  Resp 16  Ht 5\' 6"  (1.676 m)  Wt 130 lb (58.968 kg)  BMI 20.98 kg/m2  SpO2 98%  LMP 09/11/2011  Physical Exam  Nursing note and vitals reviewed. Constitutional: She is oriented to person, place, and time. She appears well-developed and well-nourished.  HENT:  Head: Normocephalic and atraumatic.  Nose: Nose normal.  Mouth/Throat: Oropharynx is clear and moist.       No sinus or temporal tenderness.   Eyes: Conjunctivae and EOM are normal. Pupils are equal, round, and reactive to light.  Neck: Normal range of motion. Neck supple.       No stiffness or rigidity  Cardiovascular: Normal rate and regular rhythm.   Pulmonary/Chest: Effort normal and breath sounds normal.  Abdominal: Soft. Bowel sounds are normal. There is no tenderness.  Musculoskeletal: Normal range of motion. She exhibits no edema.  Neurological: She is alert and oriented to person, place, and time. No cranial nerve deficit. Coordination normal.       Motor intact bil. Steady gait.   Skin: Skin is warm and dry.  Psychiatric: She has a normal mood and affect.    ED Course  Procedures (including critical care time) DIAGNOSTIC STUDIES: Oxygen Saturation is 98% on room air, normal by my interpretation.    COORDINATION OF CARE: 4:18PM- Patient informed of current plan for treatment and evaluation and agrees with plan at this time.        MDM  Pt requests demerol, phenergan and toradol im stating that combination works well and that is what her neurologist, Dr Fredonia Highland [facility] has  used. I explained we will try that combo but will have to substitute dilaudid for demerol - pt states that has worked fine in past.  Dilaudid 1 mg im. Phenergan 25 im. toradol 30 mg im.   Recheck pain persists but improved. Pt denies hx cad or adverse rx to imitrex. Will rx.    Recheck pain improved.    I personally performed the services described in this documentation, which was scribed in my presence. The recorded information has been reviewed and considered. Suzi Roots, MD    Suzi Roots, MD 09/18/11 [phone]

## 2011-09-20 ENCOUNTER — Emergency Department (HOSPITAL_BASED_OUTPATIENT_CLINIC_OR_DEPARTMENT_OTHER)
Admission: EM | Admit: 2011-09-20 | Discharge: 2011-09-20 | Disposition: A | Discharge status: Home / Self Care | Payer: BC Managed Care – PPO | Attending: Emergency Medicine | Admitting: Emergency Medicine

## 2011-09-20 ENCOUNTER — Encounter (HOSPITAL_BASED_OUTPATIENT_CLINIC_OR_DEPARTMENT_OTHER): Payer: Self-pay | Admitting: *Deleted

## 2011-09-20 DIAGNOSIS — H53149 Visual discomfort, unspecified: Secondary | ICD-10-CM | POA: Insufficient documentation

## 2011-09-20 DIAGNOSIS — R110 Nausea: Secondary | ICD-10-CM | POA: Insufficient documentation

## 2011-09-20 DIAGNOSIS — G43909 Migraine, unspecified, not intractable, without status migrainosus: Secondary | ICD-10-CM | POA: Insufficient documentation

## 2011-09-20 MED ORDER — DIPHENHYDRAMINE HCL 12.5 MG/5ML PO ELIX
12.5000 mg | ORAL_SOLUTION | Freq: Once | ORAL | Status: AC
  Administered 2011-09-20: 12.5 mg via ORAL
  Filled 2011-09-20: qty 10

## 2011-09-20 MED ORDER — HYDROMORPHONE HCL PF 1 MG/ML IJ SOLN
1.0000 mg | Freq: Once | INTRAMUSCULAR | Status: AC
  Administered 2011-09-20: 1 mg via INTRAVENOUS
  Filled 2011-09-20: qty 1

## 2011-09-20 MED ORDER — KETOROLAC TROMETHAMINE 30 MG/ML IJ SOLN
30.0000 mg | Freq: Once | INTRAMUSCULAR | Status: AC
  Administered 2011-09-20: 30 mg via INTRAVENOUS
  Filled 2011-09-20: qty 1

## 2011-09-20 MED ORDER — ONDANSETRON HCL 4 MG/2ML IJ SOLN
4.0000 mg | Freq: Once | INTRAMUSCULAR | Status: AC
  Administered 2011-09-20: 4 mg via INTRAVENOUS
  Filled 2011-09-20: qty 2

## 2011-09-20 MED ORDER — SODIUM CHLORIDE 0.9 % IV BOLUS (SEPSIS)
1000.0000 mL | Freq: Once | INTRAVENOUS | Status: AC
  Administered 2011-09-20: 1000 mL via INTRAVENOUS

## 2011-09-20 MED ORDER — DROPERIDOL 2.5 MG/ML IJ SOLN
2.5000 mg | Freq: Once | INTRAMUSCULAR | Status: AC
  Administered 2011-09-20: 2.5 mg via INTRAVENOUS
  Filled 2011-09-20: qty 2

## 2011-09-20 NOTE — ED Notes (Signed)
SR on monitor without ectopy, IV fluids infusing without diff, IV site unremarkable.

## 2011-09-20 NOTE — ED Provider Notes (Signed)
History   This chart was scribed for Nelia Shi, MD scribed by Magnus Sinning. The patient was seen in room MH11/MH11 seen at 19:16.    CSN: [id number]  Arrival date & time 09/20/11  1904   First MD Initiated Contact with Patient 09/20/11 1914      Chief Complaint  Patient presents with  . Migraine    (Consider location/radiation/quality/duration/timing/severity/associated sxs/prior treatment) HPI Shannon [name] is a 51 y.o. female who presents to the Emergency Department complaining of gradually worsening severe HA with associated nausea, sound sensitivity, and  photophobia. Patient was seen 2 days ago for the same sxs and was treated with hydromorphone, phenergan, and Toradol with significant moderate relief. She says she was feeling fine yesterday and this morning, but explains that this afternoon her HA returned. She says the pain is localized behind her right eye. She has made an appt with her neurologist.  Past Medical History  Diagnosis Date  . Anemia   . Depression   . Eating disorder     anorexia nervosa in her 20's  . Fainting spell   . Kidney stones     passed on their own  . Hx of laminectomy 1994    L4-5  . Stomach disorder     due to complications with cholecystectomy "almost died"  . Anxiety     Past Surgical History  Procedure Date  . Cholecystectomy 08/2009    reports history of gallbladder polyps, she reports stents in duct of lushca   . Appendectomy 1976  . Tonsillectomy 1982  . Laminectomy 1994    L4-5  . Tubal ligation   . Hysteroscopy 07/2011    Family History  Problem Relation Age of Onset  . Hypertension Mother   . Diabetes Father   . Alcohol abuse Maternal Grandmother   . Cancer Paternal Grandfather     lung cancer    History  Substance Use Topics  . Smoking status: Never Smoker   . Smokeless tobacco: Never Used  . Alcohol Use: No   Review of Systems  All other systems reviewed and are negative.   10 Systems reviewed and  are negative for acute change except as noted in the HPI. Allergies  Eszopiclone; Flexeril; Gabapentin; Morphine and related; Nsaids; Paroxetine; Shellfish allergy; and Tizanidine hcl  Home Medications   Current Outpatient Rx  Name Route Sig Dispense Refill  . CLONAZEPAM 1 MG PO TABS Oral Take 1-2 mg by mouth 3 (three) times daily. Take 1 tablet in the morning and lunch and 2 tablets at bedtime.    Marland Kitchen HYDROCODONE-ACETAMINOPHEN 10-325 MG PO TABS  Take 1 tablet four times daily as needed for back pain.     Marland Kitchen ONDANSETRON HCL 4 MG PO TABS Oral Take 1 tablet by mouth Twice daily.    . VENLAFAXINE HCL ER 75 MG PO CP24  Take 2 capsules every morning.       BP 118/72  Pulse 84  Temp(Src) 98 F (36.7 C) (Oral)  Resp 16  Ht 5\' 6"  (1.676 m)  Wt 120 lb (54.432 kg)  BMI 19.37 kg/m2  SpO2 100%  LMP 09/11/2011  Physical Exam  Nursing note and vitals reviewed. Constitutional: She is oriented to person, place, and time. She appears well-developed and well-nourished. No distress.  HENT:  Head: Normocephalic and atraumatic.  Eyes: Pupils are equal, round, and reactive to light.  Neck: Normal range of motion.  Cardiovascular: Normal rate and intact distal pulses.   Pulmonary/Chest: No respiratory  distress.  Abdominal: Normal appearance. She exhibits no distension.  Musculoskeletal: Normal range of motion.  Neurological: She is alert and oriented to person, place, and time. No cranial nerve deficit.       Photophobic  Skin: Skin is warm and dry. No rash noted.  Psychiatric: She has a normal mood and affect. Her behavior is normal.    ED Course  Procedures (including critical care time)  Scheduled Meds:    . diphenhydrAMINE  12.5 mg Oral Once  . droperidol  2.5 mg Intravenous Once  .  HYDROmorphone (DILAUDID) injection  1 mg Intravenous Once  . ketorolac  30 mg Intravenous Once  . ondansetron  4 mg Intravenous Once  . sodium chloride  1,000 mL Intravenous Once   Continuous Infusions:    PRN Meds:.  After treatment in the ED the patient feels back to baseline and wants to go home.   DIAGNOSTIC STUDIES: Oxygen Saturation is 100% on room air, norma by my interpretation.    COORDINATION OF CARE:  Labs Reviewed - No data to display No results found.   1. Migraine       MDM  I personally performed the services described in this documentation, which was scribed in my presence. The recorded information has been reviewed and considered.        Nelia Shi, MD 09/20/11 2134

## 2011-09-20 NOTE — Discharge Instructions (Signed)

## 2011-09-20 NOTE — ED Notes (Signed)
Pt seen here 18th for same, c/o mograine

## 2012-02-15 ENCOUNTER — Ambulatory Visit (INDEPENDENT_AMBULATORY_CARE_PROVIDER_SITE_OTHER): Payer: BC Managed Care – PPO | Admitting: Family

## 2012-02-15 ENCOUNTER — Encounter: Payer: Self-pay | Admitting: Family

## 2012-02-15 VITALS — BP 116/78 | HR 73 | Temp 98.2°F | Resp 16 | Ht 65.75 in | Wt 143.0 lb

## 2012-02-15 DIAGNOSIS — F32A Depression, unspecified: Secondary | ICD-10-CM

## 2012-02-15 DIAGNOSIS — Z0000 Encounter for general adult medical examination without abnormal findings: Secondary | ICD-10-CM

## 2012-02-15 DIAGNOSIS — F341 Dysthymic disorder: Secondary | ICD-10-CM

## 2012-02-15 DIAGNOSIS — F329 Major depressive disorder, single episode, unspecified: Secondary | ICD-10-CM

## 2012-02-15 DIAGNOSIS — R9431 Abnormal electrocardiogram [ECG] [EKG]: Secondary | ICD-10-CM

## 2012-02-15 HISTORY — DX: Encounter for general adult medical examination without abnormal findings: Z00.00

## 2012-02-15 MED ORDER — LUBIPROSTONE 24 MCG PO CAPS: 24.0000 ug | ORAL_CAPSULE | Freq: Two times a day (BID) | ORAL | Status: DC

## 2012-02-15 NOTE — Patient Instructions (Addendum)
Please return fasting for lab work at your convenience. Schedule your mammogram on the first floor.  Please schedule a follow up appointment in 3 months sooner if problems/concerns.

## 2012-02-15 NOTE — Progress Notes (Signed)
Subjective:    Patient ID: Shannon [name], female    DOB: 09/01/1960, 51 y.o.   MRN: [id number]  HPI  Patient presents today for complete physical.  Immunizations: declines flu, tetanus up to date Diet: not eating well due to stress. Exercise: swims regularly Colonoscopy: 7 yrs ago- due for follow up 2016 Dexa:up to date Pap Smear: followed by Julio Sicks- has had abnormal paps and is being followed closely Mammogram: Due  Depression- Reports that she recently learned that her 6 yr old son is a drug addict.  He is addicted to cocaine and crystal meth.  He dropped out of college.  She asked him to leave the home and does not know where he is right now.  All this has happened in the last 2 weeks.  As a result she is not sleeping well. Was placed on Viibryd by psychiatr- [name]- PA.  Dr. Lovette Cliche is psychologist. She has follow up arranged and plans to attend a [place] meeting tonight.   Review of Systems See HPI  Past Medical History  Diagnosis Date  . Anemia   . Depression   . Eating disorder     anorexia nervosa in her 20's  . Fainting spell   . Kidney stones     passed on their own  . Hx of laminectomy 1994    L4-5  . Stomach disorder     due to complications with cholecystectomy "almost died"  . Anxiety     History   Social History  . Marital Status: Married    Spouse Name: N/A    Number of Children: 3  . Years of Education: N/A   Occupational History  .     Social History Main Topics  . Smoking status: Never Smoker   . Smokeless tobacco: Never Used  . Alcohol Use: No  . Drug Use: No  . Sexually Active: Not on file   Other Topics Concern  . Not on file   Social History Narrative   Regular exercise:  Yes (swim instructor)Caffeine Use:  1 dailyMarried 3 children ages 81 son, 63 son, and 38 daughterAssociates degree    Past Surgical History  Procedure Date  . Cholecystectomy 08/2009    reports history of gallbladder polyps, she reports stents in  duct of lushca   . Appendectomy 1976  . Tonsillectomy 1982  . Laminectomy 1994    L4-5  . Tubal ligation   . Hysteroscopy 07/2011    Family History  Problem Relation Age of Onset  . Hypertension Mother   . Diabetes Father   . Alcohol abuse Maternal Grandmother   . Cancer Paternal Grandfather     lung cancer    Allergies  Allergen Reactions  . Eszopiclone     Headaches  . Flexeril (Cyclobenzaprine Hcl) Other (See Comments)    Mood swings  . Gabapentin     sedation  . Morphine And Related Other (See Comments)    No relief  . Nsaids Other (See Comments)    GI bleed  . Paroxetine Nausea Only       . Shellfish Allergy Other (See Comments)    skin & mouth tingle  . Tizanidine Hcl Other (See Comments)    Dizziness, mood swings    Current Outpatient Prescriptions on File Prior to Visit  Medication Sig Dispense Refill  . clonazePAM (KLONOPIN) 1 MG tablet Pt takes 2 tablets 3 times daily.      Marland Kitchen HYDROcodone-acetaminophen (NORCO) 10-325 MG per tablet Take  1 tablet four times daily as needed for back pain.       . mirtazapine (REMERON) 15 MG tablet Take 15 mg by mouth 2 (two) times daily.      . Vilazodone HCl (VIIBRYD) 40 MG TABS Take 60 mg by mouth daily.      . ondansetron (ZOFRAN) 4 MG tablet Take 1 tablet by mouth Twice daily.      . promethazine (PHENERGAN) 25 MG tablet Take 1 tablet (25 mg total) by mouth every 6 (six) hours as needed for nausea.  60 tablet  0  . venlafaxine (EFFEXOR-XR) 75 MG 24 hr capsule Take 2 capsules every morning.         BP 116/78  Pulse 73  Temp 98.2 F (36.8 C) (Oral)  Resp 16  Ht 5' 5.75" (1.67 m)  Wt 143 lb (64.864 kg)  BMI 23.26 kg/m2  SpO2 99%  LMP 01/19/2012       Objective:   Physical Exam Physical Exam  Constitutional: She is oriented to person, place, and time. She appears well-developed and well-nourished. No distress.  HENT:  Head: Normocephalic and atraumatic.  Right Ear: Tympanic membrane and ear canal normal.    Left Ear: Tympanic membrane and ear canal normal.  Mouth/Throat: Oropharynx is clear and moist.  Eyes: Pupils are equal, round, and reactive to light. No scleral icterus.  Neck: Normal range of motion. No thyromegaly present.  Cardiovascular: Normal rate and regular rhythm.   No murmur heard. Pulmonary/Chest: Effort normal and breath sounds normal. No respiratory distress. He has no wheezes. She has no rales. She exhibits no tenderness.  Abdominal: Soft. Bowel sounds are normal. He exhibits no distension and no mass. There is no tenderness. There is no rebound and no guarding.  Musculoskeletal: She exhibits no edema.  Lymphadenopathy:    She has no cervical adenopathy.  Neurological: She is alert and oriented to person, place, and time. She exhibits normal muscle tone. Coordination normal.  Skin: Skin is warm and dry.  Psychiatric: Pt was tearful when talking about her son. Judgment and thought content normal.  Breasts: Examined lying Right: Without masses, retractions, discharge or axillary adenopathy.  Left: Without masses, retractions, discharge or axillary adenopathy.    Assessment & Plan:          Assessment & Plan:

## 2012-02-15 NOTE — Assessment & Plan Note (Signed)
EKG is performed today and compared to EKG 06/17/11.  Note is made of ST elevation in V2- previous T wave was inverted in V2.  She reports some recent SOB, but also has had a significant amount of stress. Will refer for stress test for further evaluation. She is instructed to go to ED if chest pain occurs.

## 2012-02-15 NOTE — Assessment & Plan Note (Signed)
Pt to return for fasting lab work.  Tetanus up to date, declines flu, refer for mammogram.

## 2012-02-15 NOTE — Addendum Note (Signed)
Addended by: Sandford Craze on: 02/15/2012 04:48 PM   Modules accepted: Orders, Level of Service

## 2012-02-15 NOTE — Progress Notes (Signed)
  Subjective:    Patient ID: Shannon [name], female    DOB: April 26, 1961, 51 y.o.   MRN: [id number]  HPI    Review of Systems     Objective:   Physical Exam        Assessment & Plan:  Will give trial of amitiza for her opioid induced constipation. 1 week supply of samples provided.

## 2012-02-15 NOTE — Assessment & Plan Note (Signed)
Defer management to psychiatry. Support was provided today.

## 2012-02-22 ENCOUNTER — Ambulatory Visit (HOSPITAL_COMMUNITY): Payer: BC Managed Care – PPO | Attending: Cardiology | Admitting: Radiology

## 2012-02-22 VITALS — BP 109/72 | HR 74 | Ht 66.0 in | Wt 140.0 lb

## 2012-02-22 DIAGNOSIS — R0789 Other chest pain: Secondary | ICD-10-CM | POA: Insufficient documentation

## 2012-02-22 DIAGNOSIS — R9431 Abnormal electrocardiogram [ECG] [EKG]: Secondary | ICD-10-CM | POA: Insufficient documentation

## 2012-02-22 DIAGNOSIS — R079 Chest pain, unspecified: Secondary | ICD-10-CM

## 2012-02-22 DIAGNOSIS — R5383 Other fatigue: Secondary | ICD-10-CM | POA: Insufficient documentation

## 2012-02-22 DIAGNOSIS — R5381 Other malaise: Secondary | ICD-10-CM | POA: Insufficient documentation

## 2012-02-22 DIAGNOSIS — R110 Nausea: Secondary | ICD-10-CM | POA: Insufficient documentation

## 2012-02-22 DIAGNOSIS — R000 Tachycardia, unspecified: Secondary | ICD-10-CM | POA: Insufficient documentation

## 2012-02-22 DIAGNOSIS — R002 Palpitations: Secondary | ICD-10-CM | POA: Insufficient documentation

## 2012-02-22 DIAGNOSIS — R55 Syncope and collapse: Secondary | ICD-10-CM | POA: Insufficient documentation

## 2012-02-22 MED ORDER — TECHNETIUM TC 99M SESTAMIBI GENERIC - CARDIOLITE
11.0000 | Freq: Once | INTRAVENOUS | Status: AC | PRN
  Administered 2012-02-22: 11 via INTRAVENOUS

## 2012-02-22 MED ORDER — REGADENOSON 0.4 MG/5ML IV SOLN
0.4000 mg | Freq: Once | INTRAVENOUS | Status: AC
  Administered 2012-02-22: 0.4 mg via INTRAVENOUS

## 2012-02-22 MED ORDER — TECHNETIUM TC 99M SESTAMIBI GENERIC - CARDIOLITE
33.0000 | Freq: Once | INTRAVENOUS | Status: AC | PRN
  Administered 2012-02-22: 33 via INTRAVENOUS

## 2012-02-22 NOTE — Progress Notes (Signed)
[facility] SITE 3 NUCLEAR MED [id number] [place] [address] [phone]  Cardiology Nuclear Med Study  Shannon [name] is a 51 y.o. female     MRN : [id number]     DOB: 1961/04/19  Procedure Date: 02/22/2012  Nuclear Med Background Indication for Stress Test:  Evaluation for Ischemia and Abnormal EKG History:  ~12 yrs ago GXT:OK per patient; Stress Cardiac Risk Factors: No cardiac risk factors.  Symptoms:  Chest Pressure with Exertion (last episode of chest discomfort was last night), Fatigue, Nausea,  Palpitations, Rapid HR and Near Syncope/Syncope x 2 in the Last Year   Nuclear Pre-Procedure Caffeine/Decaff Intake:  None > 12 hrs NPO After: 2:00pm yesterday   Lungs:  clear O2 Sat: 98% on room air. IV 0.9% NS with Angio Cath:  22g  IV Site: R Antecubital x 1, tolerated well IV Started by:  Irean Hong, RN  Chest Size (in):  32 Cup Size: A  Height: 5\' 6"  (1.676 m)  Weight:  140 lb (63.504 kg)  BMI:  Body mass index is 22.60 kg/(m^2). Tech Comments:  n/a    Nuclear Med Study 1 or 2 day study: 1 day  Stress Test Type:  Treadmill/Lexiscan  Reading MD: Cassell Clement, MD  Order Authorizing Provider:  Charlynn Court, MD, and Sandford Craze, NP  Resting Radionuclide: Technetium 32m Sestamibi  Resting Radionuclide Dose: 11.0 mCi   Stress Radionuclide:  Technetium 19m Sestamibi  Stress Radionuclide Dose: 33.0 mCi           Stress Protocol Rest HR: 74 Stress HR: 112  Rest BP: 109/72 Stress BP: 119/79  Exercise Time (min): 2:00 METS: n/a   Predicted Max HR: 170 bpm % Max HR: 65.88 bpm Rate Pressure Product: 24401   Dose of Adenosine (mg):  n/a Dose of Lexiscan: 0.4 mg Dose of Aminophylline:125 mg  Dose of Atropine (mg): n/a Dose of Dobutamine: n/a mcg/kg/min (at max HR)  Stress Test Technologist: Smiley Houseman, CMA-N  Nuclear Technologist:  Domenic Polite, CNMT     Rest Procedure:  Myocardial perfusion imaging was  performed at rest 45 minutes following the intravenous administration of Technetium 62m Sestamibi.  Rest ECG: No acute changes.  Stress Procedure:  The patient received IV Lexiscan 0.4 mg over 15-seconds with concurrent low level exercise and then Technetium 57m Sestamibi was injected at 30-seconds while the patient continued walking one more minute. There were no significant changes with Lexiscan. Quantitative spect images were obtained after a 45-minute delay.  The patient became very symptomatic with nausea, vomiting and headache while waiting on stress images.  She was given a total of 125 mg Aminophylline with some relief.  Stress ECG: No significant change from baseline ECG  QPS Raw Data Images:  Normal; no motion artifact; normal heart/lung ratio. Stress Images:  Normal homogeneous uptake in all areas of the myocardium. Rest Images:  Normal homogeneous uptake in all areas of the myocardium. Subtraction (SDS):  No evidence of ischemia. Transient Ischemic Dilatation (Normal <1.22):  1.19 Lung/Heart Ratio (Normal <0.45):  0.36  Quantitative Gated Spect Images QGS EDV:  62 ml QGS ESV:  20 ml  Impression Exercise Capacity:  Lexiscan with low level exercise. BP Response:  Normal blood pressure response. Clinical Symptoms:  There is dyspnea. ECG Impression:  No significant ST segment change suggestive of ischemia. Comparison with Prior Nuclear Study: No previous nuclear study performed  Overall Impression:  Normal stress nuclear study.  LV Ejection Fraction: 68%.  LV  Wall Motion:  NL LV Function; NL Wall Motion  [employer]

## 2012-02-23 ENCOUNTER — Encounter: Payer: Self-pay | Admitting: Family

## 2012-02-23 ENCOUNTER — Ambulatory Visit (INDEPENDENT_AMBULATORY_CARE_PROVIDER_SITE_OTHER): Payer: BC Managed Care – PPO | Admitting: Family

## 2012-02-23 ENCOUNTER — Telehealth: Payer: Self-pay | Admitting: *Deleted

## 2012-02-23 ENCOUNTER — Telehealth: Payer: Self-pay | Admitting: Family

## 2012-02-23 VITALS — BP 102/75 | HR 61 | Temp 97.8°F | Resp 16 | Wt 140.0 lb

## 2012-02-23 DIAGNOSIS — G43909 Migraine, unspecified, not intractable, without status migrainosus: Secondary | ICD-10-CM

## 2012-02-23 DIAGNOSIS — R197 Diarrhea, unspecified: Secondary | ICD-10-CM

## 2012-02-23 MED ORDER — DIPHENOXYLATE-ATROPINE 2.5-0.025 MG PO TABS: 1.0000 | ORAL_TABLET | Freq: Four times a day (QID) | ORAL | Status: DC | PRN

## 2012-02-23 MED ORDER — KETOROLAC TROMETHAMINE 30 MG/ML IJ SOLN
30.0000 mg | Freq: Once | INTRAMUSCULAR | Status: AC
  Administered 2012-02-23: 30 mg via INTRAMUSCULAR

## 2012-02-23 NOTE — Progress Notes (Signed)
Subjective:    Patient ID: Shannon [name], female    DOB: [birth year], 51 y.o.   MRN: [id number]  HPI  Shannon [name] is a 51 yr old female who presents today with chief complaint of HA.  She reports that HA started yesterday during her stress test and is associated with vomitting and diarrhea. She reports that she is not longer vomitting, but diarrhea persists.  Had diarrhea "all night."  She reports that she has taken two migraine med doses (these meds are prescribed by Dr. Pryor Montes) today.  No significant improvement.  Review of Systems See HPI  Past Medical History  Diagnosis Date  . Anemia   . Depression   . Eating disorder     anorexia nervosa in her 20's  . Fainting spell   . Kidney stones     passed on their own  . Hx of laminectomy 1994    L4-5  . Stomach disorder     due to complications with cholecystectomy "almost died"  . Anxiety     History   Social History  . Marital Status: Married    Spouse Name: N/A    Number of Children: 3  . Years of Education: N/A   Occupational History  .     Social History Main Topics  . Smoking status: Never Smoker   . Smokeless tobacco: Never Used  . Alcohol Use: No  . Drug Use: No  . Sexually Active: Not on file   Other Topics Concern  . Not on file   Social History Narrative   Regular exercise:  Yes (swim instructor)Caffeine Use:  1 dailyMarried 3 children ages 21 son, 22 son, and 55 daughterAssociates degree    Past Surgical History  Procedure Date  . Cholecystectomy 08/2009    reports history of gallbladder polyps, she reports stents in duct of lushca   . Appendectomy 1976  . Tonsillectomy 1982  . Laminectomy 1994    L4-5  . Tubal ligation   . Hysteroscopy 07/2011    Family History  Problem Relation Age of Onset  . Hypertension Mother   . Diabetes Father   . Alcohol abuse Maternal Grandmother   . Cancer Paternal Grandfather     lung cancer    Allergies  Allergen Reactions  . Eszopiclone     Headaches   . Flexeril (Cyclobenzaprine Hcl) Other (See Comments)    Mood swings  . Gabapentin     sedation  . Morphine And Related Other (See Comments)    No relief  . Nsaids Other (See Comments)    GI bleed  . Paroxetine Nausea Only       . Shellfish Allergy Other (See Comments)    skin & mouth tingle  . Tizanidine Hcl Other (See Comments)    Dizziness, mood swings    Current Outpatient Prescriptions on File Prior to Visit  Medication Sig Dispense Refill  . clonazePAM (KLONOPIN) 1 MG tablet Pt takes 2 tablets 3 times daily.      Marland Kitchen HYDROcodone-acetaminophen (NORCO) 10-325 MG per tablet Take 1 tablet four times daily as needed for back pain.       Marland Kitchen lubiprostone (AMITIZA) 24 MCG capsule Take 1 capsule (24 mcg total) by mouth 2 (two) times daily with a meal.  60 capsule  2  . mirtazapine (REMERON) 15 MG tablet Take 15 mg by mouth 2 (two) times daily.      . promethazine (PHENERGAN) 25 MG tablet Take 25 mg by mouth as needed.      Marland Kitchen  venlafaxine (EFFEXOR-XR) 75 MG 24 hr capsule Take 2 capsules every morning.       . Vilazodone HCl (VIIBRYD) 40 MG TABS Take 60 mg by mouth daily.      . promethazine (PHENERGAN) 25 MG tablet Take 1 tablet (25 mg total) by mouth every 6 (six) hours as needed for nausea.  60 tablet  0    BP 102/75  Pulse 61  Temp 97.8 F (36.6 C) (Oral)  Resp 16  Wt 140 lb (63.504 kg)  SpO2 96%  LMP 01/19/2012       Objective:   Physical Exam  Constitutional: She is oriented to person, place, and time.       Uncomfortable appearing white female wearing sunglasses in dark room.   Cardiovascular: Normal rate.   No murmur heard. Pulmonary/Chest: Effort normal and breath sounds normal. No respiratory distress. She has no wheezes. She has no rales. She exhibits no tenderness.  Neurological: She is alert and oriented to person, place, and time. She exhibits normal muscle tone. Coordination normal.  Psychiatric: She has a normal mood and affect. Her behavior is normal.  Judgment and thought content normal.          Assessment & Plan:

## 2012-02-23 NOTE — Assessment & Plan Note (Signed)
IM toradol given in office today.  OK to take dose of oxycodone today.

## 2012-02-23 NOTE — Telephone Encounter (Signed)
Pt called requesting results from test on her heart done on 02/22/12. Pt reports having a bad reaction while there, with nausea, vomiting and headache. Pt reports she is still suffering from bad migraine headache, with diarrhea and some vomiting. Please advise

## 2012-02-23 NOTE — Assessment & Plan Note (Signed)
?   secodary side effect from sestamibi yesterday vs gastroenteritis.  Recommended phenergan prn, prn lomotil. Call if symptoms worsen or if no improvement.

## 2012-02-23 NOTE — Patient Instructions (Addendum)
Please call if symptoms worsen or if no improvement in 24 hours.  Stay well hydrated.

## 2012-02-23 NOTE — Telephone Encounter (Signed)
Pt scheduled for this afternoon. Will discuss at apt.

## 2012-02-23 NOTE — Telephone Encounter (Signed)
Caller: [name]/Patient; Patient Name: Shannon [name]; PCP: Peggyann Juba, Melissa (Adults only); Best Callback Phone Number: [phone]  LMP: 02/21/12 Patient states she had a stress test completed 02/22/12. Patient is requesting results. Patient also states she developed vomiting, diarrhea and a migraine headache, onset 02/22/12 during stress test, after receiving contrast media. States emesis X 1 02/22/12. States no vomiting since. States continues to have diarrhea. States loose bowel movement X 4 02/23/12. Afebrile. States continues to have migraine headache located over right eye. States unrelieved by prescribed headache medication. Triage per Headache and Diarrhea Protocol. No emergent sx identified. Care advice given per guidelines related to positive triage assessment for " Typical Headache and usual therapy is not working."  Patient advised dark quiet room, cool compresses to head, no driving. Patient advised increased fluids. Call back parameters reviewed. Patient verbalizes understanding. Appt. scheduled for 02/23/12 1330 with Sandford Craze.

## 2012-03-08 ENCOUNTER — Encounter: Payer: Self-pay | Admitting: Family

## 2012-03-08 ENCOUNTER — Ambulatory Visit (INDEPENDENT_AMBULATORY_CARE_PROVIDER_SITE_OTHER): Payer: BC Managed Care – PPO | Admitting: Family

## 2012-03-08 ENCOUNTER — Other Ambulatory Visit: Payer: Self-pay | Admitting: Family

## 2012-03-08 VITALS — BP 108/78 | HR 75 | Temp 98.5°F | Resp 16 | Ht 65.75 in | Wt 139.1 lb

## 2012-03-08 DIAGNOSIS — R0602 Shortness of breath: Secondary | ICD-10-CM

## 2012-03-08 DIAGNOSIS — Z0000 Encounter for general adult medical examination without abnormal findings: Secondary | ICD-10-CM

## 2012-03-08 DIAGNOSIS — R0789 Other chest pain: Secondary | ICD-10-CM | POA: Insufficient documentation

## 2012-03-08 HISTORY — DX: Other chest pain: R07.89

## 2012-03-08 LAB — BASIC METABOLIC PANEL WITH GFR
CO2: 24 mEq/L (ref 19–32)
Glucose, Bld: 117 mg/dL — ABNORMAL HIGH (ref 70–99)
Potassium: 4 mEq/L (ref 3.5–5.3)
Sodium: 139 mEq/L (ref 135–145)

## 2012-03-08 LAB — HEPATIC FUNCTION PANEL
AST: 16 U/L (ref 0–37)
Albumin: 4.9 g/dL (ref 3.5–5.2)
Alkaline Phosphatase: 48 U/L (ref 39–117)
Total Bilirubin: 0.4 mg/dL (ref 0.3–1.2)

## 2012-03-08 LAB — CBC WITH DIFFERENTIAL/PLATELET
Eosinophils Absolute: 0 10*3/uL (ref 0.0–0.7)
Eosinophils Relative: 0 % (ref 0–5)
HCT: 41.4 % (ref 36.0–46.0)
Hemoglobin: 14.5 g/dL (ref 12.0–15.0)
Lymphs Abs: 2.3 10*3/uL (ref 0.7–4.0)
MCH: 32.2 pg (ref 26.0–34.0)
MCV: 92 fL (ref 78.0–100.0)
Monocytes Absolute: 1 10*3/uL (ref 0.1–1.0)
Monocytes Relative: 10 % (ref 3–12)
RBC: 4.5 MIL/uL (ref 3.87–5.11)

## 2012-03-08 LAB — LIPID PANEL: Total CHOL/HDL Ratio: 2.3 Ratio

## 2012-03-08 LAB — HEPATITIS B SURFACE ANTIBODY,QUALITATIVE: Hep B S Ab: NEGATIVE

## 2012-03-08 LAB — TSH: TSH: 0.503 u[IU]/mL (ref 0.350–4.500)

## 2012-03-08 NOTE — Patient Instructions (Addendum)
Please complete blood work prior to leaving.  Go to ER if chest pain becomes severe. Follow up in 3 months. Sooner if problems/concerns.

## 2012-03-08 NOTE — Assessment & Plan Note (Signed)
Sprirometry was performed today and shows normal lung function.  Recent negative stress test.  I think stress is primary contributer to her chest pain. I encouraged her to continue to work with therapist and psychiatry and she is agreeable. > 25 minutes spent with the pt today.  >50% of this time was spent counseling pt on her depression and atypical chest pain.

## 2012-03-08 NOTE — Progress Notes (Signed)
Subjective:    Patient ID: Shannon [name], female    DOB: [birth year], 51 y.o.   MRN: [id number]  HPI  [name] is a 51 year old female who presents today with complaint of ongoing chest pressure, sob and nausea. Feels like someone is "sitting on me."  Feels that it is  Due to stress.  She denies recent long car rides. Denies calf pain.  She saw [name] (psychiatry) who gave her med for sleep.  She reports that it has helped her to fall asleep but is not keeping her asleep.  Reports that this past weekend she attended a funeral for her son's best friend who died of a drug overdose.  Her son is addicted to drugs and did not acknowledge her or her husband at the funeral. She tells me that she feels "heartbroken."  Review of Systems See HPI  Past Medical History  Diagnosis Date  . Anemia   . Depression   . Eating disorder     anorexia nervosa in her 20's  . Fainting spell   . Kidney stones     passed on their own  . Hx of laminectomy 1994    L4-5  . Stomach disorder     due to complications with cholecystectomy "almost died"  . Anxiety     History   Social History  . Marital Status: Married    Spouse Name: N/A    Number of Children: 3  . Years of Education: N/A   Occupational History  .     Social History Main Topics  . Smoking status: Never Smoker   . Smokeless tobacco: Never Used  . Alcohol Use: No  . Drug Use: No  . Sexually Active: Not on file   Other Topics Concern  . Not on file   Social History Narrative   Regular exercise:  Yes (swim instructor)Caffeine Use:  1 dailyMarried 3 children ages 34 son, 58 son, and 56 daughterAssociates degree    Past Surgical History  Procedure Date  . Cholecystectomy 08/2009    reports history of gallbladder polyps, she reports stents in duct of lushca   . Appendectomy 1976  . Tonsillectomy 1982  . Laminectomy 1994    L4-5  . Tubal ligation   . Hysteroscopy 07/2011    Family History  Problem Relation Age of  Onset  . Hypertension Mother   . Diabetes Father   . Alcohol abuse Maternal Grandmother   . Cancer Paternal Grandfather     lung cancer    Allergies  Allergen Reactions  . Eszopiclone     Headaches  . Flexeril (Cyclobenzaprine Hcl) Other (See Comments)    Mood swings  . Gabapentin     sedation  . Morphine And Related Other (See Comments)    No relief  . Nsaids Other (See Comments)    GI bleed  . Paroxetine Nausea Only       . Shellfish Allergy Other (See Comments)    skin & mouth tingle  . Tizanidine Hcl Other (See Comments)    Dizziness, mood swings    Current Outpatient Prescriptions on File Prior to Visit  Medication Sig Dispense Refill  . clonazePAM (KLONOPIN) 1 MG tablet Pt takes 2 tablets 3 times daily.      . diphenoxylate-atropine (LOMOTIL) 2.5-0.025 MG per tablet Take 1 tablet by mouth 4 (four) times daily as needed for diarrhea or loose stools.  15 tablet  0  . HYDROcodone-acetaminophen (NORCO) 10-325 MG per  tablet Take 1 tablet four times daily as needed for back pain.       Marland Kitchen lubiprostone (AMITIZA) 24 MCG capsule Take 1 capsule (24 mcg total) by mouth 2 (two) times daily with a meal.  60 capsule  2  . mirtazapine (REMERON) 15 MG tablet Take 15 mg by mouth 2 (two) times daily.      . promethazine (PHENERGAN) 25 MG tablet Take 25 mg by mouth as needed.      . venlafaxine (EFFEXOR-XR) 75 MG 24 hr capsule Take 2 capsules every morning.       . Vilazodone HCl (VIIBRYD) 40 MG TABS Take 60 mg by mouth daily.      . promethazine (PHENERGAN) 25 MG tablet Take 1 tablet (25 mg total) by mouth every 6 (six) hours as needed for nausea.  60 tablet  0    BP 108/78  Pulse 75  Temp 98.5 F (36.9 C) (Oral)  Resp 16  Ht 5' 5.75" (1.67 m)  Wt 139 lb 1.9 oz (63.104 kg)  BMI 22.63 kg/m2  SpO2 96%  LMP 02/23/2012       Objective:   Physical Exam  Constitutional: She is oriented to person, place, and time. She appears well-developed and well-nourished. No distress.    Cardiovascular: Normal rate.   Pulmonary/Chest: Effort normal and breath sounds normal.  Musculoskeletal: She exhibits no edema.  Neurological: She is alert and oriented to person, place, and time.  Skin: Skin is warm and dry. No erythema.  Psychiatric:       Patient became tearful during exam.  Otherwise pleasant and appropriate.          Assessment & Plan:

## 2012-03-09 LAB — URINALYSIS, ROUTINE W REFLEX MICROSCOPIC
Hgb urine dipstick: NEGATIVE
Nitrite: NEGATIVE
Specific Gravity, Urine: 1.027 (ref 1.005–1.030)
Urobilinogen, UA: 0.2 mg/dL (ref 0.0–1.0)

## 2012-03-12 LAB — HEMOGLOBIN A1C: Mean Plasma Glucose: 105 mg/dL (ref <117)

## 2012-03-13 ENCOUNTER — Encounter: Payer: Self-pay | Admitting: Family

## 2012-03-22 ENCOUNTER — Ambulatory Visit (HOSPITAL_BASED_OUTPATIENT_CLINIC_OR_DEPARTMENT_OTHER)
Admission: RE | Admit: 2012-03-22 | Discharge: 2012-03-22 | Disposition: A | Discharge status: Home / Self Care | Payer: BC Managed Care – PPO | Source: Ambulatory Visit | Attending: Family | Admitting: Family

## 2012-03-22 ENCOUNTER — Ambulatory Visit (INDEPENDENT_AMBULATORY_CARE_PROVIDER_SITE_OTHER): Payer: BC Managed Care – PPO | Admitting: Family

## 2012-03-22 ENCOUNTER — Encounter: Payer: Self-pay | Admitting: Family

## 2012-03-22 VITALS — BP 100/70 | HR 79 | Temp 98.0°F | Resp 16 | Ht 65.75 in | Wt 144.1 lb

## 2012-03-22 DIAGNOSIS — R0781 Pleurodynia: Secondary | ICD-10-CM

## 2012-03-22 DIAGNOSIS — R079 Chest pain, unspecified: Secondary | ICD-10-CM

## 2012-03-22 DIAGNOSIS — S20219A Contusion of unspecified front wall of thorax, initial encounter: Secondary | ICD-10-CM

## 2012-03-22 DIAGNOSIS — W19XXXA Unspecified fall, initial encounter: Secondary | ICD-10-CM | POA: Insufficient documentation

## 2012-03-22 MED ORDER — LIDOCAINE 5 % EX PTCH: 1.0000 | MEDICATED_PATCH | CUTANEOUS | Status: DC

## 2012-03-22 NOTE — Patient Instructions (Addendum)
Please complete your x ray on the first floor. You may use lidoderm patch as needed for pain.  Call if symptoms worsen or if no improvement of the next few weeks.

## 2012-03-22 NOTE — Progress Notes (Signed)
Subjective:    Patient ID: Shannon [name], female    DOB: 1960/05/10, 51 y.o.   MRN: [id number]  HPI  [name] is a 50 yr old female who presents today with chief complaint of left sided rib pain. Pain started on 11/15 after she fell onto some luggage. Pain is worsened by inspiration.   She is flying to [place] this weekend.  She is using hydrocodone without significant improvement in her pain.      Review of Systems    see HPI  Past Medical History  Diagnosis Date  . Anemia   . Depression   . Eating disorder     anorexia nervosa in her 20's  . Fainting spell   . Kidney stones     passed on their own  . Hx of laminectomy 1994    L4-5  . Stomach disorder     due to complications with cholecystectomy "almost died"  . Anxiety     History   Social History  . Marital Status: Married    Spouse Name: N/A    Number of Children: 3  . Years of Education: N/A   Occupational History  .     Social History Main Topics  . Smoking status: Never Smoker   . Smokeless tobacco: Never Used  . Alcohol Use: No  . Drug Use: No  . Sexually Active: Not on file   Other Topics Concern  . Not on file   Social History Narrative   Regular exercise:  Yes (swim instructor)Caffeine Use:  1 dailyMarried 3 children ages 35 son, 7 son, and 17 daughterAssociates degree    Past Surgical History  Procedure Date  . Cholecystectomy 08/2009    reports history of gallbladder polyps, she reports stents in duct of lushca   . Appendectomy 1976  . Tonsillectomy 1982  . Laminectomy 1994    L4-5  . Tubal ligation   . Hysteroscopy 07/2011    Family History  Problem Relation Age of Onset  . Hypertension Mother   . Diabetes Father   . Alcohol abuse Maternal Grandmother   . Cancer Paternal Grandfather     lung cancer    Allergies  Allergen Reactions  . Eszopiclone     Headaches  . Flexeril (Cyclobenzaprine Hcl) Other (See Comments)    Mood swings  . Gabapentin     sedation    . Morphine And Related Other (See Comments)    No relief  . Nsaids Other (See Comments)    GI bleed  . Paroxetine Nausea Only       . Shellfish Allergy Other (See Comments)    skin & mouth tingle  . Tizanidine Hcl Other (See Comments)    Dizziness, mood swings    Current Outpatient Prescriptions on File Prior to Visit  Medication Sig Dispense Refill  . clonazePAM (KLONOPIN) 1 MG tablet Pt takes 2 tablets 3 times daily.      . diphenoxylate-atropine (LOMOTIL) 2.5-0.025 MG per tablet Take 1 tablet by mouth 4 (four) times daily as needed for diarrhea or loose stools.  15 tablet  0  . HYDROcodone-acetaminophen (NORCO) 10-325 MG per tablet Take 1 tablet four times daily as needed for back pain.       Marland Kitchen lubiprostone (AMITIZA) 24 MCG capsule Take 1 capsule (24 mcg total) by mouth 2 (two) times daily with a meal.  60 capsule  2  . mirtazapine (REMERON) 15 MG tablet Take 15 mg by mouth 2 (two) times  daily.      . promethazine (PHENERGAN) 25 MG tablet Take 25 mg by mouth as needed.      . venlafaxine (EFFEXOR-XR) 75 MG 24 hr capsule Take 2 capsules every morning.       . Vilazodone HCl (VIIBRYD) 40 MG TABS Take 60 mg by mouth daily.      . promethazine (PHENERGAN) 25 MG tablet Take 1 tablet (25 mg total) by mouth every 6 (six) hours as needed for nausea.  60 tablet  0    BP 100/70  Pulse 79  Temp 98 F (36.7 C) (Oral)  Resp 16  Ht 5' 5.75" (1.67 m)  Wt 144 lb 1.3 oz (65.354 kg)  BMI 23.43 kg/m2  SpO2 97%  LMP 03/19/2012     Objective:   Physical Exam  Constitutional: She appears well-developed and well-nourished. No distress.  Cardiovascular: Normal rate and regular rhythm.   No murmur heard. Pulmonary/Chest: Effort normal and breath sounds normal. No respiratory distress. She has no wheezes. She has no rales. She exhibits no tenderness.  Musculoskeletal: She exhibits no edema.       Arms:      + bruising left lateral rib cage.Tender to touch  Psychiatric: She has a normal  mood and affect. Her behavior is normal. Judgment and thought content normal.          Assessment & Plan:

## 2012-03-22 NOTE — Assessment & Plan Note (Signed)
Rib x ray and CXR is negative.  Add lidoderm patch for comfort.

## 2012-06-08 ENCOUNTER — Encounter (HOSPITAL_BASED_OUTPATIENT_CLINIC_OR_DEPARTMENT_OTHER): Payer: Self-pay | Admitting: *Deleted

## 2012-06-08 ENCOUNTER — Emergency Department (HOSPITAL_BASED_OUTPATIENT_CLINIC_OR_DEPARTMENT_OTHER)
Admission: EM | Admit: 2012-06-08 | Discharge: 2012-06-08 | Disposition: A | Discharge status: Home / Self Care | Payer: BC Managed Care – PPO | Attending: Emergency Medicine | Admitting: Emergency Medicine

## 2012-06-08 DIAGNOSIS — R110 Nausea: Secondary | ICD-10-CM | POA: Insufficient documentation

## 2012-06-08 DIAGNOSIS — Z3202 Encounter for pregnancy test, result negative: Secondary | ICD-10-CM | POA: Insufficient documentation

## 2012-06-08 DIAGNOSIS — Z87442 Personal history of urinary calculi: Secondary | ICD-10-CM | POA: Insufficient documentation

## 2012-06-08 DIAGNOSIS — R5383 Other fatigue: Secondary | ICD-10-CM | POA: Insufficient documentation

## 2012-06-08 DIAGNOSIS — R55 Syncope and collapse: Secondary | ICD-10-CM

## 2012-06-08 DIAGNOSIS — Z8669 Personal history of other diseases of the nervous system and sense organs: Secondary | ICD-10-CM | POA: Insufficient documentation

## 2012-06-08 DIAGNOSIS — F329 Major depressive disorder, single episode, unspecified: Secondary | ICD-10-CM | POA: Insufficient documentation

## 2012-06-08 DIAGNOSIS — R5381 Other malaise: Secondary | ICD-10-CM | POA: Insufficient documentation

## 2012-06-08 DIAGNOSIS — Z8719 Personal history of other diseases of the digestive system: Secondary | ICD-10-CM | POA: Insufficient documentation

## 2012-06-08 DIAGNOSIS — Z79899 Other long term (current) drug therapy: Secondary | ICD-10-CM | POA: Insufficient documentation

## 2012-06-08 DIAGNOSIS — Z862 Personal history of diseases of the blood and blood-forming organs and certain disorders involving the immune mechanism: Secondary | ICD-10-CM | POA: Insufficient documentation

## 2012-06-08 DIAGNOSIS — F3289 Other specified depressive episodes: Secondary | ICD-10-CM | POA: Insufficient documentation

## 2012-06-08 DIAGNOSIS — IMO0001 Reserved for inherently not codable concepts without codable children: Secondary | ICD-10-CM | POA: Insufficient documentation

## 2012-06-08 DIAGNOSIS — R42 Dizziness and giddiness: Secondary | ICD-10-CM | POA: Insufficient documentation

## 2012-06-08 DIAGNOSIS — F411 Generalized anxiety disorder: Secondary | ICD-10-CM | POA: Insufficient documentation

## 2012-06-08 LAB — URINALYSIS, ROUTINE W REFLEX MICROSCOPIC
Bilirubin Urine: NEGATIVE
Glucose, UA: NEGATIVE mg/dL
Hgb urine dipstick: NEGATIVE
Ketones, ur: NEGATIVE mg/dL
Leukocytes, UA: NEGATIVE
pH: 7 (ref 5.0–8.0)

## 2012-06-08 LAB — COMPREHENSIVE METABOLIC PANEL
ALT: 21 U/L (ref 0–35)
AST: 19 U/L (ref 0–37)
Albumin: 3.9 g/dL (ref 3.5–5.2)
Alkaline Phosphatase: 51 U/L (ref 39–117)
Potassium: 3.9 mEq/L (ref 3.5–5.1)
Sodium: 137 mEq/L (ref 135–145)
Total Protein: 7.1 g/dL (ref 6.0–8.3)

## 2012-06-08 LAB — CBC WITH DIFFERENTIAL/PLATELET
Basophils Absolute: 0 10*3/uL (ref 0.0–0.1)
Basophils Relative: 1 % (ref 0–1)
Eosinophils Absolute: 0.1 10*3/uL (ref 0.0–0.7)
Eosinophils Relative: 2 % (ref 0–5)
HCT: 37.3 % (ref 36.0–46.0)
MCH: 32.4 pg (ref 26.0–34.0)
MCHC: 34.6 g/dL (ref 30.0–36.0)
MCV: 93.7 fL (ref 78.0–100.0)
Monocytes Absolute: 0.7 10*3/uL (ref 0.1–1.0)
RDW: 11.7 % (ref 11.5–15.5)

## 2012-06-08 MED ORDER — KETOROLAC TROMETHAMINE 30 MG/ML IJ SOLN
30.0000 mg | Freq: Once | INTRAMUSCULAR | Status: AC
  Administered 2012-06-08: 30 mg via INTRAVENOUS
  Filled 2012-06-08: qty 1

## 2012-06-08 MED ORDER — ONDANSETRON HCL 4 MG/2ML IJ SOLN
4.0000 mg | Freq: Once | INTRAMUSCULAR | Status: AC
  Administered 2012-06-08: 4 mg via INTRAVENOUS
  Filled 2012-06-08: qty 2

## 2012-06-08 MED ORDER — SODIUM CHLORIDE 0.9 % IV BOLUS (SEPSIS)
1000.0000 mL | Freq: Once | INTRAVENOUS | Status: AC
  Administered 2012-06-08: 1000 mL via INTRAVENOUS

## 2012-06-08 NOTE — ED Provider Notes (Signed)
History     CSN: [id number]  Arrival date & time 06/08/12  1632   First MD Initiated Contact with Patient 06/08/12 1639      Chief Complaint  Patient presents with  . Chills    (Consider location/radiation/quality/duration/timing/severity/associated sxs/prior treatment) HPI Comments: Patient present by EMS with acute onset of weakness, dizziness, lightheadedness, chills and nausea. 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 She did not eat lunch. She states the ventilation system at the pool of poor images trunk wearing smell. 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 She had laid down and beat. The office. Denies any syncope, loss of consciousness, head trauma. Denies any fevers, chest pain or shortness of breath. States she is feeling better than when this initially happened.  The history is provided by the patient.    Past Medical History  Diagnosis Date  . Anemia   . Depression   . Eating disorder     anorexia nervosa in her 20's  . Fainting spell   . Kidney stones     passed on their own  . Hx of laminectomy 1994    L4-5  . Stomach disorder     due to complications with cholecystectomy "almost died"  . Anxiety     Past Surgical History  Procedure Date  . Cholecystectomy 08/2009    reports history of gallbladder polyps, she reports stents in duct of lushca   . Appendectomy 1976  . Tonsillectomy 1982  . Laminectomy 1994    L4-5  . Tubal ligation   . Hysteroscopy 07/2011    Family History  Problem Relation Age of Onset  . Hypertension Mother   . Diabetes Father   . Alcohol abuse Maternal Grandmother   . Cancer Paternal Grandfather     lung cancer    History  Substance Use Topics  . Smoking status: Never Smoker   . Smokeless tobacco: Never Used  . Alcohol Use: No    OB History    Grav Para Term Preterm Abortions TAB SAB Ect Mult Living                  Review of Systems   Constitutional: Negative for fever, activity change and appetite change.  HENT: Negative for congestion, sore throat, rhinorrhea, neck pain and neck stiffness.   Respiratory: Negative for cough, chest tightness and shortness of breath.   Cardiovascular: Negative for chest pain.  Gastrointestinal: Positive for nausea. Negative for abdominal pain.  Genitourinary: Negative for dysuria.  Musculoskeletal: Positive for myalgias and arthralgias.  Skin: Negative for rash.  Neurological: Positive for dizziness, weakness and light-headedness.  A complete 10 system review of systems was obtained and all systems are negative except as noted in the HPI and PMH.    Allergies  Eszopiclone; Flexeril; Gabapentin; Morphine and related; Nsaids; Paroxetine; Shellfish allergy; and Tizanidine hcl  Home Medications   Current Outpatient Rx  Name  Route  Sig  Dispense  Refill  . CLONAZEPAM 1 MG PO TABS      Pt takes 2 tablets 3 times daily.         Marland Kitchen DIPHENOXYLATE-ATROPINE 2.5-0.025 MG PO TABS   Oral   Take 1 tablet by mouth 4 (four) times daily as needed for diarrhea or loose stools.   15 tablet   0   . HYDROCODONE-ACETAMINOPHEN 10-325 MG PO TABS      Take 1 tablet four times daily as  needed for back pain.          Marland Kitchen LIDOCAINE 5 % EX PTCH   Transdermal   Place 1 patch onto the skin daily. Remove & Discard patch within 12 hours or as directed by MD   12 patch   0   . LUBIPROSTONE 24 MCG PO CAPS   Oral   Take 1 capsule (24 mcg total) by mouth 2 (two) times daily with a meal.   60 capsule   2   . MIRTAZAPINE 15 MG PO TABS   Oral   Take 15 mg by mouth 2 (two) times daily.         Marland Kitchen PROMETHAZINE HCL 25 MG PO TABS   Oral   Take 1 tablet (25 mg total) by mouth every 6 (six) hours as needed for nausea.   60 tablet   0   . PROMETHAZINE HCL 25 MG PO TABS   Oral   Take 25 mg by mouth as needed.         . VENLAFAXINE HCL ER 75 MG PO CP24      Take 2 capsules every morning.           Marland Kitchen VILAZODONE HCL 40 MG PO TABS   Oral   Take 60 mg by mouth daily.           BP 121/69  Pulse 65  Temp 98.7 F (37.1 C) (Oral)  Resp 18  Ht 5\' 6"  (1.676 m)  Wt 133 lb (60.328 kg)  BMI 21.47 kg/m2  SpO2 97%  LMP 06/08/2012  Physical Exam  Constitutional: She is oriented to person, place, and time. She appears well-developed and well-nourished. No distress.  HENT:  Head: Normocephalic and atraumatic.  Mouth/Throat: Oropharynx is clear and moist. No oropharyngeal exudate.  Eyes: Conjunctivae normal and EOM are normal. Pupils are equal, round, and reactive to light.  Neck: Normal range of motion. Neck supple.       No meningismus  Cardiovascular: Normal rate, regular rhythm and normal heart sounds.   No murmur heard. Pulmonary/Chest: Effort normal and breath sounds normal. No respiratory distress.  Abdominal: Soft. There is no tenderness. There is no rebound and no guarding.  Musculoskeletal: Normal range of motion. She exhibits no edema.  Neurological: She is alert and oriented to person, place, and time. No cranial nerve deficit. She exhibits normal muscle tone. Coordination normal.       CN 2-12 intact, 5/5 strength throughout, no ataxia on finger to nose  Skin: Skin is warm. No rash noted.    ED Course  Procedures (including critical care time)  Labs Reviewed  COMPREHENSIVE METABOLIC PANEL - Abnormal; Notable for the following:    Glucose, Bld 102 (*)     Total Bilirubin 0.2 (*)     GFR calc non Af Amer 85 (*)     All other components within normal limits  CBC WITH DIFFERENTIAL  URINALYSIS, ROUTINE W REFLEX MICROSCOPIC  PREGNANCY, URINE  TROPONIN I   No results found.   1. 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 Nonfocal neurological exam.  Suspect vasovagal syncope. Patient endorses strong chlorine smell at home. Discussed with poison center who states supportive care  only is appropriate. Electrolytes unremarkable. EKG nonischemic with no arrhythmias. Orthostatic vitals positive. Patient tolerating by mouth in the ED. She is able to ambulate without assistance.  Date: 06/08/2012  Rate: 65  Rhythm: normal sinus rhythm  QRS Axis: normal  Intervals: normal  ST/T Wave abnormalities: normal  Conduction Disutrbances:none  Narrative Interpretation:   Old EKG Reviewed: none available    Glynn Octave, MD 06/09/12 [phone]

## 2012-06-08 NOTE — ED Notes (Signed)
Walked pt. Pt tolerated well.

## 2012-06-08 NOTE — ED Notes (Signed)
Brought in by EMS , from work , [employer]) c/o chills fever and nausea

## 2012-06-09 ENCOUNTER — Telehealth: Payer: Self-pay | Admitting: *Deleted

## 2012-06-09 NOTE — Telephone Encounter (Signed)
Possibly related to chlorine exposure.  We should see her back in the office early next week please.

## 2012-06-09 NOTE — Telephone Encounter (Signed)
Pt called stating she passed out at work yesterday. 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 Continues to have burning in her airway (esophagus and chest) today, still has migraine and will take med for that. Pt wants to know if the burning in her esophagus/chest is from smelling the chlorine yesterday and how long can she expect symptoms to last.  Please advise.

## 2012-06-09 NOTE — Telephone Encounter (Signed)
Notified pt, scheduled f/u for Monday at 8am.

## 2012-06-11 ENCOUNTER — Emergency Department (HOSPITAL_BASED_OUTPATIENT_CLINIC_OR_DEPARTMENT_OTHER)
Admission: EM | Admit: 2012-06-11 | Discharge: 2012-06-11 | Disposition: A | Discharge status: Home / Self Care | Payer: BC Managed Care – PPO | Attending: Emergency Medicine | Admitting: Emergency Medicine

## 2012-06-11 ENCOUNTER — Encounter (HOSPITAL_BASED_OUTPATIENT_CLINIC_OR_DEPARTMENT_OTHER): Payer: Self-pay | Admitting: *Deleted

## 2012-06-11 ENCOUNTER — Emergency Department (HOSPITAL_BASED_OUTPATIENT_CLINIC_OR_DEPARTMENT_OTHER): Payer: BC Managed Care – PPO

## 2012-06-11 DIAGNOSIS — H53149 Visual discomfort, unspecified: Secondary | ICD-10-CM | POA: Insufficient documentation

## 2012-06-11 DIAGNOSIS — F411 Generalized anxiety disorder: Secondary | ICD-10-CM | POA: Insufficient documentation

## 2012-06-11 DIAGNOSIS — Z87442 Personal history of urinary calculi: Secondary | ICD-10-CM | POA: Insufficient documentation

## 2012-06-11 DIAGNOSIS — Z8659 Personal history of other mental and behavioral disorders: Secondary | ICD-10-CM | POA: Insufficient documentation

## 2012-06-11 DIAGNOSIS — Z862 Personal history of diseases of the blood and blood-forming organs and certain disorders involving the immune mechanism: Secondary | ICD-10-CM | POA: Insufficient documentation

## 2012-06-11 DIAGNOSIS — Z79899 Other long term (current) drug therapy: Secondary | ICD-10-CM | POA: Insufficient documentation

## 2012-06-11 DIAGNOSIS — F3289 Other specified depressive episodes: Secondary | ICD-10-CM | POA: Insufficient documentation

## 2012-06-11 DIAGNOSIS — Z8719 Personal history of other diseases of the digestive system: Secondary | ICD-10-CM | POA: Insufficient documentation

## 2012-06-11 DIAGNOSIS — R4182 Altered mental status, unspecified: Secondary | ICD-10-CM

## 2012-06-11 DIAGNOSIS — F329 Major depressive disorder, single episode, unspecified: Secondary | ICD-10-CM | POA: Insufficient documentation

## 2012-06-11 DIAGNOSIS — G43909 Migraine, unspecified, not intractable, without status migrainosus: Secondary | ICD-10-CM

## 2012-06-11 DIAGNOSIS — Z9889 Other specified postprocedural states: Secondary | ICD-10-CM | POA: Insufficient documentation

## 2012-06-11 DIAGNOSIS — R4789 Other speech disturbances: Secondary | ICD-10-CM | POA: Insufficient documentation

## 2012-06-11 LAB — COMPREHENSIVE METABOLIC PANEL
ALT: 19 U/L (ref 0–35)
Alkaline Phosphatase: 58 U/L (ref 39–117)
CO2: 29 mEq/L (ref 19–32)
Calcium: 9.3 mg/dL (ref 8.4–10.5)
GFR calc Af Amer: 85 mL/min — ABNORMAL LOW (ref >90)
GFR calc non Af Amer: 73 mL/min — ABNORMAL LOW (ref >90)
Glucose, Bld: 118 mg/dL — ABNORMAL HIGH (ref 70–99)
Potassium: 3.8 mEq/L (ref 3.5–5.1)
Sodium: 139 mEq/L (ref 135–145)

## 2012-06-11 LAB — RAPID URINE DRUG SCREEN, HOSP PERFORMED
Amphetamines: NOT DETECTED
Barbiturates: NOT DETECTED
Tetrahydrocannabinol: NOT DETECTED

## 2012-06-11 LAB — CBC WITH DIFFERENTIAL/PLATELET
Eosinophils Relative: 2 % (ref 0–5)
Hemoglobin: 13.3 g/dL (ref 12.0–15.0)
Lymphocytes Relative: 34 % (ref 12–46)
Lymphs Abs: 2.1 10*3/uL (ref 0.7–4.0)
MCV: 96.3 fL (ref 78.0–100.0)
Platelets: 279 10*3/uL (ref 150–400)
RBC: 4.08 MIL/uL (ref 3.87–5.11)
WBC: 6.1 10*3/uL (ref 4.0–10.5)

## 2012-06-11 LAB — URINALYSIS, ROUTINE W REFLEX MICROSCOPIC
Leukocytes, UA: NEGATIVE
Protein, ur: NEGATIVE mg/dL
Urobilinogen, UA: 0.2 mg/dL (ref 0.0–1.0)

## 2012-06-11 MED ORDER — METOCLOPRAMIDE HCL 5 MG/ML IJ SOLN
10.0000 mg | Freq: Once | INTRAMUSCULAR | Status: AC
  Administered 2012-06-11: 10 mg via INTRAVENOUS
  Filled 2012-06-11: qty 2

## 2012-06-11 MED ORDER — KETOROLAC TROMETHAMINE 30 MG/ML IJ SOLN
30.0000 mg | Freq: Once | INTRAMUSCULAR | Status: AC
  Administered 2012-06-11: 30 mg via INTRAVENOUS
  Filled 2012-06-11: qty 1

## 2012-06-11 MED ORDER — FENTANYL CITRATE 0.05 MG/ML IJ SOLN
50.0000 ug | Freq: Once | INTRAMUSCULAR | Status: AC
  Administered 2012-06-11: 50 ug via INTRAVENOUS
  Filled 2012-06-11: qty 2

## 2012-06-11 MED ORDER — SODIUM CHLORIDE 0.9 % IV BOLUS (SEPSIS)
1000.0000 mL | Freq: Once | INTRAVENOUS | Status: AC
  Administered 2012-06-11: 1000 mL via INTRAVENOUS

## 2012-06-11 MED ORDER — DEXAMETHASONE SODIUM PHOSPHATE 4 MG/ML IJ SOLN
4.0000 mg | Freq: Once | INTRAMUSCULAR | Status: AC
  Administered 2012-06-11: 4 mg via INTRAVENOUS
  Filled 2012-06-11: qty 1

## 2012-06-11 NOTE — ED Notes (Signed)
Pt originally exposed to chlorine on Thursday and seen here for same. OK Friday and Sat. Today woke up disoriented. "Tired" x 4-5 days. Migraine x 45 min. Pupils dilated. Reactive to light.

## 2012-06-11 NOTE — ED Provider Notes (Addendum)
History  This chart was scribed for Richardean Canal, MD by Shari Heritage, ED Scribe. The patient was seen in room MH10/MH10. Patient's care was started at 1509.   CSN: [id number]  Arrival date & time 06/11/12  1448   First MD Initiated Contact with Patient 06/11/12 [phone]      Chief Complaint  Patient presents with  . Altered Mental Status  . Headache    The history is provided by the patient and the spouse. No language interpreter was used.    HPI Comments: Shannon [name] is a 52 y.o. female who presents to the Emergency Department complaining of AMS and a constant, moderate to severe, non-radiating, dull migraine headache onset 1 hour ago. There is associated photophobia. Patient states the headache woke her up around 2pm. It was worse than her typical migraines. Patient's husband says that she fell asleep at about 1 pm and woke up at around 2 pm. When she woke up, she was also disoriented and did not know what day it was. Husband states that she has had slurred speech since waking up. He states that she was normal earlier this morning. Patient denies any other symptoms at this time. Patient states that she took her migraine medicine at home prior to arrival. She also took hydrocodone this morning about 8 hours ago. Husband states that patient is not usually disoriented after taking migraine or pain medicines. He does not know if she took increased doses. Patient was seen here 2 days ago by Dr. Glynn Octave for near syncopal episode after exposure to chlorine without ingestion. Patient states that she has improved since her last visit. Patient has no history of CVA, TIA, MI or other cardiac disease. Patient has a medical history of chronic pain, anxiety and depression. Denies suicidal ideation or overdose.   Past Medical History  Diagnosis Date  . Anemia   . Depression   . Eating disorder     anorexia nervosa in her 20's  . Fainting spell   . Kidney stones     passed on their own  . Hx of  laminectomy 1994    L4-5  . Stomach disorder     due to complications with cholecystectomy "almost died"  . Anxiety     Past Surgical History  Procedure Laterality Date  . Cholecystectomy  08/2009    reports history of gallbladder polyps, she reports stents in duct of lushca   . Appendectomy  1976  . Tonsillectomy  1982  . Laminectomy  1994    L4-5  . Tubal ligation    . Hysteroscopy  07/2011    Family History  Problem Relation Age of Onset  . Hypertension Mother   . Diabetes Father   . Alcohol abuse Maternal Grandmother   . Cancer Paternal Grandfather     lung cancer    History  Substance Use Topics  . Smoking status: Never Smoker   . Smokeless tobacco: Never Used  . Alcohol Use: No    OB History   Grav Para Term Preterm Abortions TAB SAB Ect Mult Living                  Review of Systems  Eyes: Positive for photophobia.  Neurological: Positive for speech difficulty and headaches.  All other systems reviewed and are negative.    Allergies  Eszopiclone; Flexeril; Gabapentin; Morphine and related; Nsaids; Paroxetine; Shellfish allergy; and Tizanidine hcl  Home Medications   Current Outpatient Rx  Name  Route  Sig  Dispense  Refill  . clonazePAM (KLONOPIN) 1 MG tablet      Pt takes 2 tablets 3 times daily.         . diphenoxylate-atropine (LOMOTIL) 2.5-0.025 MG per tablet   Oral   Take 1 tablet by mouth 4 (four) times daily as needed for diarrhea or loose stools.   15 tablet   0   . HYDROcodone-acetaminophen (NORCO) 10-325 MG per tablet      Take 1 tablet four times daily as needed for back pain.          Marland Kitchen lidocaine (LIDODERM) 5 %   Transdermal   Place 1 patch onto the skin daily. Remove & Discard patch within 12 hours or as directed by MD   12 patch   0   . lubiprostone (AMITIZA) 24 MCG capsule   Oral   Take 1 capsule (24 mcg total) by mouth 2 (two) times daily with a meal.   60 capsule   2   . mirtazapine (REMERON) 15 MG tablet    Oral   Take 15 mg by mouth 2 (two) times daily.         Marland Kitchen EXPIRED: promethazine (PHENERGAN) 25 MG tablet   Oral   Take 1 tablet (25 mg total) by mouth every 6 (six) hours as needed for nausea.   60 tablet   0   . promethazine (PHENERGAN) 25 MG tablet   Oral   Take 25 mg by mouth as needed.         . venlafaxine (EFFEXOR-XR) 75 MG 24 hr capsule      Take 2 capsules every morning.          . Vilazodone HCl (VIIBRYD) 40 MG TABS   Oral   Take 60 mg by mouth daily.           Triage Vitals: BP 114/73  Pulse 68  Temp(Src) 98.1 F (36.7 C) (Oral)  Resp 18  Ht 5\' 6"  (1.676 m)  Wt 133 lb (60.328 kg)  BMI 21.48 kg/m2  SpO2 100%  LMP 06/08/2012  Physical Exam  Nursing note and vitals reviewed. Constitutional: She appears well-developed and well-nourished.  Tired, wakes up and answering questions. Uncomfortable   HENT:  Head: Normocephalic and atraumatic.  Eyes: Conjunctivae and EOM are normal. Pupils are equal, round, and reactive to light.  Neck: Normal range of motion. Neck supple.  Cardiovascular: Normal rate, regular rhythm and normal heart sounds.   Pulmonary/Chest: Effort normal and breath sounds normal. No respiratory distress.  Abdominal: She exhibits no distension.  Musculoskeletal: Normal range of motion.  Neurological: She is alert. No cranial nerve deficit.  Strength and sensation normal.  Skin: Skin is warm and dry.  Psychiatric: She has a normal mood and affect. Her behavior is normal. Judgment and thought content normal.    ED Course  Procedures (including critical care time) DIAGNOSTIC STUDIES: Oxygen Saturation is 100% on room air, normal by my interpretation.    COORDINATION OF CARE: 3:18 PM- Will give IV fluids, Reglan 10 mg and Decadron 4 mg and order CT head, UA, CBC, CMP, ethanol, acetaminophen level, salicylate level and drug screen panel. Patient and husband informed of current plan for treatment and evaluation and agrees with plan at this  time.   4:00 PM- Ordered Toradol 30 mg.  4:26 PM- Patient states that HA has improved slightly. Informed patient of lab and imaging results which were reassuring. Patient symptoms and studies are not concerning  for stroke. Patient and husband advised that disorientation and sluggishness are likely due to severity of headache. Will give fentanyl 50 mcg shot for additional pain relief.   Labs Reviewed  COMPREHENSIVE METABOLIC PANEL - Abnormal; Notable for the following:    Glucose, Bld 118 (*)    Total Bilirubin 0.2 (*)    GFR calc non Af Amer 73 (*)    GFR calc Af Amer 85 (*)    All other components within normal limits  SALICYLATE LEVEL - Abnormal; Notable for the following:    Salicylate Lvl <2.0 (*)    All other components within normal limits  URINE RAPID DRUG SCREEN (HOSP PERFORMED) - Abnormal; Notable for the following:    Opiates POSITIVE (*)    All other components within normal limits  CBC WITH DIFFERENTIAL  ETHANOL  ACETAMINOPHEN LEVEL  URINALYSIS, ROUTINE W REFLEX MICROSCOPIC    Ct Head Wo Contrast  06/11/2012  *RADIOLOGY REPORT*  Clinical Data: Altered mental status  CT HEAD WITHOUT CONTRAST  Technique:  Contiguous axial images were obtained from the base of the skull through the vertex without contrast.  Comparison: 07/16/2011  Findings: Ventricle size is normal.  Negative for acute infarct. Negative for hemorrhage or mass.  No edema or midline shift. Calvarium is normal.  Paranasal sinuses are clear.  IMPRESSION: Negative   Original Report Authenticated By: Janeece Riggers, M.D.      No diagnosis found.    MDM  Shannon [name] is a 52 y.o. female here with headache, AMS. Will consider drug overdose vs complicated migraines. Unlikely to have stroke vs electrolyte abnormalities. Will check labs, CT head, give migraine cocktail.   5:30 PM Patient awake and alert. Ambulating well. Headache improved and not tired now. CT head nl, electrolytes nl. Tox + opiates but she is on  clonopin. I am not concerned for subarachnoid and onset of symptoms within 6 hrs so CT will be very sensitive. Not concerned for stroke. Will d/c home.     I personally performed the services described in this documentation, which was scribed in my presence. The recorded information has been reviewed and is accurate.    Richardean Canal, MD 06/11/12 1731  Richardean Canal, MD 06/11/12 [phone]

## 2012-06-12 ENCOUNTER — Encounter: Payer: Self-pay | Admitting: Family

## 2012-06-12 ENCOUNTER — Ambulatory Visit (INDEPENDENT_AMBULATORY_CARE_PROVIDER_SITE_OTHER): Payer: BC Managed Care – PPO | Admitting: Family

## 2012-06-12 VITALS — BP 98/68 | HR 79 | Temp 98.0°F | Resp 16 | Wt 147.1 lb

## 2012-06-12 DIAGNOSIS — R635 Abnormal weight gain: Secondary | ICD-10-CM

## 2012-06-12 DIAGNOSIS — R55 Syncope and collapse: Secondary | ICD-10-CM

## 2012-06-12 DIAGNOSIS — F32A Depression, unspecified: Secondary | ICD-10-CM

## 2012-06-12 DIAGNOSIS — F419 Anxiety disorder, unspecified: Secondary | ICD-10-CM

## 2012-06-12 DIAGNOSIS — G43909 Migraine, unspecified, not intractable, without status migrainosus: Secondary | ICD-10-CM

## 2012-06-12 DIAGNOSIS — F341 Dysthymic disorder: Secondary | ICD-10-CM

## 2012-06-12 NOTE — Assessment & Plan Note (Signed)
Clinically stable on paxil- managed by psychiatry.

## 2012-06-12 NOTE — Patient Instructions (Addendum)
Please follow up In 3 months, sooner.

## 2012-06-12 NOTE — Assessment & Plan Note (Signed)
Work up negative, now resolved.  Monitor.

## 2012-06-12 NOTE — Progress Notes (Signed)
Subjective:    Patient ID: Shannon [name], female    DOB: 09-Apr-1961, 51 y.o.   MRN: [id number]  HPI  Ms.  Serratore is a 52 yr old female who presents today for ED follow up. She was originally seen on 2/6 following a syncopal episode. She reports that she had a pulse of 50 and then she vomited.  She reports that the ventilation system at the pool was broken and she had been inhaling fumes.  She was in a wet suit.  Work up was negative.  The ED physician felt that she had suffered vasovagal near syncope. Yesterday she was again evaluated in the ED for AMS and Migraine.  Reports that she woke up from a nap and was disoriented- did not know day or year. Reports that she received fentanyl and that this relieved her headache. Denies current headache. She reports that today is the best that she has felt in days.    She sees Dr. Albertina Sen[place] for migraines and will see her next  She sees [name] who started who started her on paxil which is helping her less tearful spells. Review of Systems See HPI  Past Medical History  Diagnosis Date  . Anemia   . Depression   . Eating disorder     anorexia nervosa in her 20's  . Fainting spell   . Kidney stones     passed on their own  . Hx of laminectomy 1994    L4-5  . Stomach disorder     due to complications with cholecystectomy "almost died"  . Anxiety     History   Social History  . Marital Status: Married    Spouse Name: N/A    Number of Children: 3  . Years of Education: N/A   Occupational History  .     Social History Main Topics  . Smoking status: Never Smoker   . Smokeless tobacco: Never Used  . Alcohol Use: No  . Drug Use: No  . Sexually Active: Yes    Birth Control/ Protection: Surgical   Other Topics Concern  . Not on file   Social History Narrative   Regular exercise:  Yes (swim instructor)   Caffeine Use:  1 daily   Married 3 children ages 75 son, 74 son, and 74 daughter   Associates degree                 Past Surgical History  Procedure Laterality Date  . Cholecystectomy  08/2009    reports history of gallbladder polyps, she reports stents in duct of lushca   . Appendectomy  1976  . Tonsillectomy  1982  . Laminectomy  1994    L4-5  . Tubal ligation    . Hysteroscopy  07/2011    Family History  Problem Relation Age of Onset  . Hypertension Mother   . Diabetes Father   . Alcohol abuse Maternal Grandmother   . Cancer Paternal Grandfather     lung cancer    Allergies  Allergen Reactions  . Eszopiclone     Headaches  . Flexeril (Cyclobenzaprine Hcl) Other (See Comments)    Mood swings  . Gabapentin     sedation  . Morphine And Related Other (See Comments)    No relief  . Nsaids Other (See Comments)    GI bleed  . Paroxetine Nausea Only       . Shellfish Allergy Other (See Comments)    skin & mouth tingle  .  Tizanidine Hcl Other (See Comments)    Dizziness, mood swings    Current Outpatient Prescriptions on File Prior to Visit  Medication Sig Dispense Refill  . clonazePAM (KLONOPIN) 1 MG tablet Pt takes 2 tablets 3 times daily.      . diphenoxylate-atropine (LOMOTIL) 2.5-0.025 MG per tablet Take 1 tablet by mouth 4 (four) times daily as needed for diarrhea or loose stools.  15 tablet  0  . HYDROcodone-acetaminophen (NORCO) 10-325 MG per tablet Take 1 tablet four times daily as needed for back pain.       Marland Kitchen lidocaine (LIDODERM) 5 % Place 1 patch onto the skin daily. Remove & Discard patch within 12 hours or as directed by MD  12 patch  0  . lubiprostone (AMITIZA) 24 MCG capsule Take 1 capsule (24 mcg total) by mouth 2 (two) times daily with a meal.  60 capsule  2  . mirtazapine (REMERON) 15 MG tablet Take 15 mg by mouth 2 (two) times daily.      . promethazine (PHENERGAN) 25 MG tablet Take 25 mg by mouth as needed.      . Vilazodone HCl (VIIBRYD) 40 MG TABS Take 60 mg by mouth daily.      . promethazine (PHENERGAN) 25 MG tablet Take 1 tablet (25 mg total) by mouth  every 6 (six) hours as needed for nausea.  60 tablet  0   No current facility-administered medications on file prior to visit.    BP 98/68  Pulse 79  Temp(Src) 98 F (36.7 C) (Oral)  Resp 16  Wt 147 lb 1.9 oz (66.733 kg)  BMI 23.76 kg/m2  SpO2 98%  LMP 06/08/2012       Objective:   Physical Exam  Constitutional: She is oriented to person, place, and time. She appears well-developed and well-nourished. No distress.  Cardiovascular: Normal rate and regular rhythm.   No murmur heard. Pulmonary/Chest: Effort normal and breath sounds normal. No respiratory distress. She has no wheezes. She has no rales. She exhibits no tenderness.  Musculoskeletal: She exhibits no edema.  Lymphadenopathy:    She has no cervical adenopathy.  Neurological: She is alert and oriented to person, place, and time.  Bilateral UE strength is 5/5. + Facial symmentry.    Psychiatric: She has a normal mood and affect. Her behavior is normal. Judgment and thought content normal.          Assessment & Plan:

## 2012-06-12 NOTE — Assessment & Plan Note (Signed)
Recent migraine flare, now feeling better.  Keep follow up apt next week with neuro.  Her AMS was likely related to her severe migraine and is now resolved.

## 2012-06-14 ENCOUNTER — Encounter: Payer: Self-pay | Admitting: Family

## 2012-06-14 LAB — TSH: TSH: 1.891 u[IU]/mL (ref 0.350–4.500)

## 2012-08-23 ENCOUNTER — Encounter: Payer: Self-pay | Admitting: Family

## 2012-08-23 ENCOUNTER — Ambulatory Visit (INDEPENDENT_AMBULATORY_CARE_PROVIDER_SITE_OTHER): Payer: BC Managed Care – PPO | Admitting: Family

## 2012-08-23 VITALS — BP 118/78 | HR 83 | Temp 98.3°F | Resp 16 | Wt 148.0 lb

## 2012-08-23 DIAGNOSIS — K5900 Constipation, unspecified: Secondary | ICD-10-CM

## 2012-08-23 DIAGNOSIS — R635 Abnormal weight gain: Secondary | ICD-10-CM | POA: Insufficient documentation

## 2012-08-23 DIAGNOSIS — R0789 Other chest pain: Secondary | ICD-10-CM

## 2012-08-23 NOTE — Assessment & Plan Note (Signed)
Deteriorated. Recommended trial of mag citrate as outlined in AVS and that she continue miralax once daily. We also discussed that primary issue is her use of narcotics.  She is trying to wean off.  She will contact us if no improvement with these measures.

## 2012-08-23 NOTE — Assessment & Plan Note (Addendum)
[name] TFT's were normal in February.  Recommended that she try counting calories using myfitness pal.  Her weight is still with normal BMI, but she wishes to lose a few pounds.  Continue regular exercise.

## 2012-08-23 NOTE — Progress Notes (Signed)
Subjective:    Patient ID: Shannon [name], female    DOB: 25-Nov-1960, 52 y.o.   MRN: [id number]  HPI  Weight Gain- pt is concerned re: weight gain. Reports healthy diet, regular exercise.  She has gained 14 pounds since February.    Constipation-  Last BM was 1 week ago.  Constipation has been intermittent x 6 months and pt reports that it seems to be worsening.  She reports that [place] is not helping. She is trying to wean off of the norco.  She is currently getting injections in back.  Tried miralax, prunex, raisin bran.   Anxiety- she continues to follow with psychiatry at [facility].  She told them that she gets chest pressure during her anxiety attacks only.  They wanted her to discuss this with [place]. She denies chest pressure during exertion.  Review of Systems    see HPI  Past Medical History  Diagnosis Date  . Anemia   . Depression   . Eating disorder     anorexia nervosa in her 20's  . Fainting spell   . Kidney stones     passed on their own  . Hx of laminectomy 1994    L4-5  . Stomach disorder     due to complications with cholecystectomy "almost died"  . Anxiety     History   Social History  . Marital Status: Married    Spouse Name: N/A    Number of Children: 3  . Years of Education: N/A   Occupational History  .     Social History Main Topics  . Smoking status: Never Smoker   . Smokeless tobacco: Never Used  . Alcohol Use: No  . Drug Use: No  . Sexually Active: Yes    Birth Control/ Protection: Surgical   Other Topics Concern  . Not on file   Social History Narrative   Regular exercise:  Yes (swim instructor)   Caffeine Use:  1 daily   Married 3 children ages 36 son, 52 son, and 79 daughter   Associates degree                Past Surgical History  Procedure Laterality Date  . Cholecystectomy  08/2009    reports history of gallbladder polyps, she reports stents in duct of lushca   . Appendectomy  1976  . Tonsillectomy  1982  .  Laminectomy  1994    L4-5  . Tubal ligation    . Hysteroscopy  07/2011    Family History  Problem Relation Age of Onset  . Hypertension Mother   . Diabetes Father   . Alcohol abuse Maternal Grandmother   . Cancer Paternal Grandfather     lung cancer  . Hashimoto's thyroiditis Sister     Allergies  Allergen Reactions  . Eszopiclone     Headaches  . Flexeril (Cyclobenzaprine Hcl) Other (See Comments)    Mood swings  . Gabapentin     sedation  . Morphine And Related Other (See Comments)    No relief  . Nsaids Other (See Comments)    GI bleed  . Paroxetine Nausea Only       . Shellfish Allergy Other (See Comments)    skin & mouth tingle  . Tizanidine Hcl Other (See Comments)    Dizziness, mood swings    Current Outpatient Prescriptions on File Prior to Visit  Medication Sig Dispense Refill  . clonazePAM (KLONOPIN) 1 MG tablet Take 1 mg by mouth 3 (three)  times daily as needed. Pt takes 2 tablets 3 times daily.      Marland Kitchen HYDROcodone-acetaminophen (NORCO) 10-325 MG per tablet Take 1 tablet four times daily as needed for back pain.       . mirtazapine (REMERON) 15 MG tablet Take 15 mg by mouth 2 (two) times daily.      Marland Kitchen PARoxetine (PAXIL) 30 MG tablet Take 30 mg by mouth every morning.      . promethazine (PHENERGAN) 25 MG tablet Take 1 tablet (25 mg total) by mouth every 6 (six) hours as needed for nausea.  60 tablet  0  . zonisamide (ZONEGRAN) 100 MG capsule Take 100 mg by mouth daily as needed.       No current facility-administered medications on file prior to visit.    BP 118/78  Pulse 83  Temp(Src) 98.3 F (36.8 C) (Oral)  Resp 16  Wt 148 lb 0.6 oz (67.151 kg)  BMI 23.91 kg/m2  SpO2 97%    Objective:   Physical Exam  Constitutional: She is oriented to person, place, and time. She appears well-developed and well-nourished. No distress.  Cardiovascular: Normal rate and regular rhythm.   No murmur heard. Pulmonary/Chest: Effort normal and breath sounds  normal. No respiratory distress. She has no wheezes. She has no rales. She exhibits no tenderness.  Abdominal: Soft. Bowel sounds are normal. She exhibits no distension.  Mild diffuse abdominal tenderness.  No guarding, no rebound  Musculoskeletal: She exhibits no edema.  Lymphadenopathy:    She has no cervical adenopathy.  Neurological: She is alert and oriented to person, place, and time.  Skin: Skin is warm and dry.  Psychiatric: She has a normal mood and affect. Her behavior is normal. Judgment and thought content normal.          Assessment & Plan:

## 2012-08-23 NOTE — Patient Instructions (Addendum)
Purchase a bottle of magnesium citrate.  Drink 1/2 bottle.  If no BM in 6 hours, can drink remainder of bottle.  You may also try a fleets enema. You will be contacted about your referral to GI.  Please let us know if you have not heard back within 1 week about your referral.

## 2012-08-23 NOTE — Assessment & Plan Note (Signed)
Pt had normal stress test in 10/13.  EKG unchanged.  Suspect related to anxiety.

## 2012-09-06 ENCOUNTER — Telehealth: Payer: Self-pay | Admitting: Family

## 2012-09-06 NOTE — Telephone Encounter (Signed)
Received medical records from [facility] °

## 2012-11-27 ENCOUNTER — Encounter: Payer: Self-pay | Admitting: Gastroenterology

## 2012-12-13 ENCOUNTER — Emergency Department (HOSPITAL_BASED_OUTPATIENT_CLINIC_OR_DEPARTMENT_OTHER): Payer: BC Managed Care – PPO

## 2012-12-13 ENCOUNTER — Encounter (HOSPITAL_BASED_OUTPATIENT_CLINIC_OR_DEPARTMENT_OTHER): Payer: Self-pay | Admitting: *Deleted

## 2012-12-13 ENCOUNTER — Emergency Department (HOSPITAL_BASED_OUTPATIENT_CLINIC_OR_DEPARTMENT_OTHER)
Admission: EM | Admit: 2012-12-13 | Discharge: 2012-12-13 | Disposition: A | Discharge status: Home / Self Care | Payer: BC Managed Care – PPO | Attending: Emergency Medicine | Admitting: Emergency Medicine

## 2012-12-13 DIAGNOSIS — F411 Generalized anxiety disorder: Secondary | ICD-10-CM | POA: Insufficient documentation

## 2012-12-13 DIAGNOSIS — R143 Flatulence: Secondary | ICD-10-CM | POA: Insufficient documentation

## 2012-12-13 DIAGNOSIS — R142 Eructation: Secondary | ICD-10-CM | POA: Insufficient documentation

## 2012-12-13 DIAGNOSIS — F3289 Other specified depressive episodes: Secondary | ICD-10-CM | POA: Insufficient documentation

## 2012-12-13 DIAGNOSIS — R110 Nausea: Secondary | ICD-10-CM | POA: Insufficient documentation

## 2012-12-13 DIAGNOSIS — Z8659 Personal history of other mental and behavioral disorders: Secondary | ICD-10-CM | POA: Insufficient documentation

## 2012-12-13 DIAGNOSIS — Z862 Personal history of diseases of the blood and blood-forming organs and certain disorders involving the immune mechanism: Secondary | ICD-10-CM | POA: Insufficient documentation

## 2012-12-13 DIAGNOSIS — F329 Major depressive disorder, single episode, unspecified: Secondary | ICD-10-CM | POA: Insufficient documentation

## 2012-12-13 DIAGNOSIS — R141 Gas pain: Secondary | ICD-10-CM | POA: Insufficient documentation

## 2012-12-13 DIAGNOSIS — Z8719 Personal history of other diseases of the digestive system: Secondary | ICD-10-CM | POA: Insufficient documentation

## 2012-12-13 DIAGNOSIS — Z3202 Encounter for pregnancy test, result negative: Secondary | ICD-10-CM | POA: Insufficient documentation

## 2012-12-13 DIAGNOSIS — Z87442 Personal history of urinary calculi: Secondary | ICD-10-CM | POA: Insufficient documentation

## 2012-12-13 DIAGNOSIS — K5900 Constipation, unspecified: Secondary | ICD-10-CM

## 2012-12-13 LAB — CBC WITH DIFFERENTIAL/PLATELET
Basophils Absolute: 0 10*3/uL (ref 0.0–0.1)
HCT: 40.5 % (ref 36.0–46.0)
Hemoglobin: 13.6 g/dL (ref 12.0–15.0)
Lymphocytes Relative: 39 % (ref 12–46)
Lymphs Abs: 2.1 10*3/uL (ref 0.7–4.0)
MCV: 95.5 fL (ref 78.0–100.0)
Monocytes Absolute: 0.6 10*3/uL (ref 0.1–1.0)
Neutro Abs: 2.7 10*3/uL (ref 1.7–7.7)
RBC: 4.24 MIL/uL (ref 3.87–5.11)
RDW: 12.3 % (ref 11.5–15.5)
WBC: 5.6 10*3/uL (ref 4.0–10.5)

## 2012-12-13 LAB — URINALYSIS, ROUTINE W REFLEX MICROSCOPIC
Glucose, UA: NEGATIVE mg/dL
Hgb urine dipstick: NEGATIVE
Ketones, ur: NEGATIVE mg/dL
Protein, ur: NEGATIVE mg/dL

## 2012-12-13 LAB — PREGNANCY, URINE: Preg Test, Ur: NEGATIVE

## 2012-12-13 LAB — BASIC METABOLIC PANEL
CO2: 28 mEq/L (ref 19–32)
Chloride: 102 mEq/L (ref 96–112)
Creatinine, Ser: 1 mg/dL (ref 0.50–1.10)
Glucose, Bld: 108 mg/dL — ABNORMAL HIGH (ref 70–99)

## 2012-12-13 MED ORDER — HYDROMORPHONE HCL PF 1 MG/ML IJ SOLN
1.0000 mg | Freq: Once | INTRAMUSCULAR | Status: AC
  Administered 2012-12-13: 1 mg via INTRAVENOUS
  Filled 2012-12-13: qty 1

## 2012-12-13 MED ORDER — ONDANSETRON 4 MG PO TBDP: 4.0000 mg | ORAL_TABLET | Freq: Three times a day (TID) | ORAL | Status: DC | PRN

## 2012-12-13 MED ORDER — POLYETHYLENE GLYCOL 3350 17 GM/SCOOP PO POWD: 17.0000 g | Freq: Two times a day (BID) | ORAL | Status: DC

## 2012-12-13 MED ORDER — ONDANSETRON HCL 4 MG/2ML IJ SOLN
4.0000 mg | Freq: Once | INTRAMUSCULAR | Status: AC
  Administered 2012-12-13: 4 mg via INTRAVENOUS
  Filled 2012-12-13: qty 2

## 2012-12-13 MED ORDER — LORAZEPAM 2 MG/ML IJ SOLN
2.0000 mg | Freq: Once | INTRAMUSCULAR | Status: AC
  Administered 2012-12-13: 2 mg via INTRAVENOUS
  Filled 2012-12-13: qty 1

## 2012-12-13 MED ORDER — IOHEXOL 300 MG/ML  SOLN
50.0000 mL | Freq: Once | INTRAMUSCULAR | Status: AC | PRN
  Administered 2012-12-13: 50 mL via ORAL

## 2012-12-13 MED ORDER — KETOROLAC TROMETHAMINE 30 MG/ML IJ SOLN: 30.0000 mg | Freq: Once | INTRAMUSCULAR | Status: DC

## 2012-12-13 MED ORDER — IOHEXOL 300 MG/ML  SOLN
100.0000 mL | Freq: Once | INTRAMUSCULAR | Status: AC | PRN
  Administered 2012-12-13: 100 mL via INTRAVENOUS

## 2012-12-13 NOTE — ED Notes (Signed)
Adult female with pt to nse desk-states pt is having an anxiety attack and has been out of klonopin x 2 days due to "pharmacy mix up"-in to see pt-pt is hyperventilating-pt instructed to slow breathing and that I will advise EDPA but unable to give any meds w/o EDPA orders

## 2012-12-13 NOTE — ED Notes (Signed)
EDPA notified of above note

## 2012-12-13 NOTE — ED Provider Notes (Signed)
CSN: [id number]     Arrival date & time 12/13/12  1523 History     First MD Initiated Contact with Patient 12/13/12 1541     Chief Complaint  Patient presents with  . Abdominal Pain   (Consider location/radiation/quality/duration/timing/severity/associated sxs/prior Treatment) HPI Comments: Patient is a 52 year old female with a past medical history of depression, previous anorexia nervosa, and anxiety who presents with abdominal pain that started today. The pain is located in her lower abdomen and does not radiate. The pain is described as aching and severe. The pain started gradually and progressively worsened since the onset. No alleviating/aggravating factors. The patient has tried nothing for symptoms without relief. Associated symptoms include nausea and constipation. Patient denies fever, headache, vomiting, diarrhea, chest pain, SOB, dysuria, abnormal vaginal bleeding/discharge. LMP 3 weeks ago. Patient reports previous appendectomy, cholecystectomy, and tubal ligation. Last normal bowel movement this morning.     Patient is a 52 y.o. female presenting with abdominal pain.  Abdominal Pain Associated symptoms: nausea     Past Medical History  Diagnosis Date  . Anemia   . Depression   . Eating disorder     anorexia nervosa in her 20's  . Fainting spell   . Kidney stones     passed on their own  . Hx of laminectomy 1994    L4-5  . Stomach disorder     due to complications with cholecystectomy "almost died"  . Anxiety    Past Surgical History  Procedure Laterality Date  . Cholecystectomy  08/2009    reports history of gallbladder polyps, she reports stents in duct of lushca   . Appendectomy  1976  . Tonsillectomy  1982  . Laminectomy  1994    L4-5  . Tubal ligation    . Hysteroscopy  07/2011   Family History  Problem Relation Age of Onset  . Hypertension Mother   . Diabetes Father   . Alcohol abuse Maternal Grandmother   . Cancer Paternal Grandfather     lung  cancer  . Hashimoto's thyroiditis Sister    History  Substance Use Topics  . Smoking status: Never Smoker   . Smokeless tobacco: Never Used  . Alcohol Use: No   OB History   Grav Para Term Preterm Abortions TAB SAB Ect Mult Living                 Review of Systems  Gastrointestinal: Positive for nausea, abdominal pain and abdominal distention.  All other systems reviewed and are negative.    Allergies  Eszopiclone; Flexeril; Gabapentin; Morphine and related; Nsaids; Paroxetine; Shellfish allergy; and Tizanidine hcl  Home Medications   Current Outpatient Rx  Name  Route  Sig  Dispense  Refill  . clonazePAM (KLONOPIN) 1 MG tablet   Oral   Take 1 mg by mouth 3 (three) times daily as needed. Pt takes 2 tablets 3 times daily.         . cloNIDine (CATAPRES) 0.1 MG tablet   Oral   Take 2 tablets by mouth at bedtime.         Marland Kitchen HYDROcodone-acetaminophen (NORCO) 10-325 MG per tablet      Take 1 tablet four times daily as needed for back pain.          Marland Kitchen lithium carbonate 300 MG capsule   Oral   Take 300 mg by mouth at bedtime.         . mirtazapine (REMERON) 15 MG tablet  Oral   Take 15 mg by mouth 2 (two) times daily.         Marland Kitchen PARoxetine (PAXIL) 30 MG tablet   Oral   Take 30 mg by mouth every morning.         . promethazine (PHENERGAN) 25 MG tablet   Oral   Take 1 tablet (25 mg total) by mouth every 6 (six) hours as needed for nausea.   60 tablet   0   . zonisamide (ZONEGRAN) 100 MG capsule   Oral   Take 100 mg by mouth daily as needed.          BP 134/87  Pulse 79  Temp(Src) 98 F (36.7 C) (Oral)  Resp 20  Ht 5\' 6"  (1.676 m)  Wt 150 lb (68.04 kg)  BMI 24.22 kg/m2  SpO2 100% Physical Exam  Nursing note and vitals reviewed. Constitutional: She is oriented to person, place, and time. She appears well-developed and well-nourished. No distress.  HENT:  Head: Normocephalic and atraumatic.  Eyes: Conjunctivae and EOM are normal. No scleral  icterus.  Neck: Normal range of motion.  Cardiovascular: Normal rate and regular rhythm.  Exam reveals no gallop and no friction rub.   No murmur heard. Pulmonary/Chest: Effort normal and breath sounds normal. She has no wheezes. She has no rales. She exhibits no tenderness.  Abdominal: Soft. She exhibits distension. There is tenderness. There is no rebound and no guarding.  Abdominal distension noted. Lower abdominal tenderness to palpation. No peritoneal signs or other focal tenderness to palpation.   Musculoskeletal: Normal range of motion.  Neurological: She is alert and oriented to person, place, and time. Coordination normal.  Speech is goal-oriented. Moves limbs without ataxia.   Skin: Skin is warm and dry.  Psychiatric: She has a normal mood and affect. Her behavior is normal.    ED Course   Procedures (including critical care time)  Labs Reviewed  BASIC METABOLIC PANEL - Abnormal; Notable for the following:    Glucose, Bld 108 (*)    GFR calc non Af Amer 64 (*)    GFR calc Af Amer 74 (*)    All other components within normal limits  URINALYSIS, ROUTINE W REFLEX MICROSCOPIC  CBC WITH DIFFERENTIAL  PREGNANCY, URINE   Ct Abdomen Pelvis W Contrast  12/13/2012   *RADIOLOGY REPORT*  Clinical Data: Abdominal pain and distention  CT ABDOMEN AND PELVIS WITH CONTRAST  Technique:  Multidetector CT imaging of the abdomen and pelvis was performed following the standard protocol during bolus administration of intravenous contrast.  Contrast: 50mL OMNIPAQUE IOHEXOL 300 MG/ML  SOLN, OMNIPAQUE IOHEXOL 300 MG/ML  SOLN  Comparison: 12/30/2010  Findings: Minimal dependent atelectasis posteriorly in the visualized lung bases.  Stable subcapsular   cyst in the posterior right hepatic segment.  Surgical clips in the gallbladder fossa. Unremarkable spleen, adrenal glands, kidneys, pancreas.  The stomach and small bowel are nondilated, with incomplete distal passage of oral contrast material.   Moderate fecal material throughout colon.  No free air.  No ascites.  Abdominal aorta, portal vein, and IVC unremarkable.  Urinary bladder is distended. Uterus and adnexal regions unremarkable.  Bilateral pelvic phleboliths.  No adenopathy localized.  No hydronephrosis on delayed scans.  Degenerative disc disease L4-5 with bilateral facet disease at this level contributing to foraminal stenosis.  IMPRESSION:  1.  No acute abdominal process. 2.  Moderate colonic fecal material without obstruction.   Original Report Authenticated By: D. Andria Rhein, MD  1. Constipation     MDM  5:10 PM Labs and urinalysis unremarkable for acute changes. No signs of infection. Patient given dilaudid and zofran for symptoms. Patient will have CT abdomen and pelvis to rule out obstruction due to history of constipation and multiple previous abdominal surgeries.   7:37 PM CT scan unremarkable for acute process. Patient will be discharged with Miralax for constipation and instructions for follow up with her doctor. Vitals stable and patient afebrile. Patient instructed to return with worsening or concerning symptoms.   Emilia Beck, [place] 12/13/12 1938

## 2012-12-13 NOTE — ED Provider Notes (Signed)
Medical screening examination/treatment/procedure(s) were performed by non-physician practitioner and as supervising physician I was immediately available for consultation/collaboration.   Kristle Wesch, MD 12/13/12 2348 

## 2012-12-13 NOTE — ED Notes (Signed)
Pt states that several hours ago she began having lower abdominal pain with nausea.

## 2012-12-21 ENCOUNTER — Encounter (HOSPITAL_COMMUNITY): Payer: Self-pay | Admitting: Emergency Medicine

## 2012-12-21 ENCOUNTER — Emergency Department (HOSPITAL_COMMUNITY)
Admission: EM | Admit: 2012-12-21 | Discharge: 2012-12-22 | Disposition: A | Discharge status: Other Acute Inpatient Hospital | Payer: BC Managed Care – PPO | Attending: Emergency Medicine | Admitting: Emergency Medicine

## 2012-12-21 DIAGNOSIS — T43501A Poisoning by unspecified antipsychotics and neuroleptics, accidental (unintentional), initial encounter: Secondary | ICD-10-CM | POA: Insufficient documentation

## 2012-12-21 DIAGNOSIS — T4272XA Poisoning by unspecified antiepileptic and sedative-hypnotic drugs, intentional self-harm, initial encounter: Secondary | ICD-10-CM | POA: Insufficient documentation

## 2012-12-21 DIAGNOSIS — R5381 Other malaise: Secondary | ICD-10-CM | POA: Insufficient documentation

## 2012-12-21 DIAGNOSIS — Z87442 Personal history of urinary calculi: Secondary | ICD-10-CM | POA: Insufficient documentation

## 2012-12-21 DIAGNOSIS — Z862 Personal history of diseases of the blood and blood-forming organs and certain disorders involving the immune mechanism: Secondary | ICD-10-CM | POA: Insufficient documentation

## 2012-12-21 DIAGNOSIS — Z8659 Personal history of other mental and behavioral disorders: Secondary | ICD-10-CM | POA: Insufficient documentation

## 2012-12-21 DIAGNOSIS — T4271XA Poisoning by unspecified antiepileptic and sedative-hypnotic drugs, accidental (unintentional), initial encounter: Secondary | ICD-10-CM | POA: Insufficient documentation

## 2012-12-21 DIAGNOSIS — Z79899 Other long term (current) drug therapy: Secondary | ICD-10-CM | POA: Insufficient documentation

## 2012-12-21 DIAGNOSIS — F411 Generalized anxiety disorder: Secondary | ICD-10-CM | POA: Insufficient documentation

## 2012-12-21 DIAGNOSIS — Z9889 Other specified postprocedural states: Secondary | ICD-10-CM | POA: Insufficient documentation

## 2012-12-21 DIAGNOSIS — Z8719 Personal history of other diseases of the digestive system: Secondary | ICD-10-CM | POA: Insufficient documentation

## 2012-12-21 DIAGNOSIS — F319 Bipolar disorder, unspecified: Secondary | ICD-10-CM

## 2012-12-21 DIAGNOSIS — R404 Transient alteration of awareness: Secondary | ICD-10-CM | POA: Insufficient documentation

## 2012-12-21 DIAGNOSIS — T43502A Poisoning by unspecified antipsychotics and neuroleptics, intentional self-harm, initial encounter: Secondary | ICD-10-CM | POA: Insufficient documentation

## 2012-12-21 DIAGNOSIS — T424X4A Poisoning by benzodiazepines, undetermined, initial encounter: Secondary | ICD-10-CM | POA: Insufficient documentation

## 2012-12-21 DIAGNOSIS — T426X2A Poisoning by other antiepileptic and sedative-hypnotic drugs, intentional self-harm, initial encounter: Secondary | ICD-10-CM | POA: Insufficient documentation

## 2012-12-21 LAB — COMPREHENSIVE METABOLIC PANEL
ALT: 69 U/L — ABNORMAL HIGH (ref 0–35)
Alkaline Phosphatase: 73 U/L (ref 39–117)
CO2: 30 mEq/L (ref 19–32)
GFR calc Af Amer: 79 mL/min — ABNORMAL LOW (ref >90)
GFR calc non Af Amer: 68 mL/min — ABNORMAL LOW (ref >90)
Glucose, Bld: 93 mg/dL (ref 70–99)
Potassium: 3.4 mEq/L — ABNORMAL LOW (ref 3.5–5.1)
Sodium: 131 mEq/L — ABNORMAL LOW (ref 135–145)

## 2012-12-21 LAB — CBC
HCT: 37.8 % (ref 36.0–46.0)
Hemoglobin: 12.8 g/dL (ref 12.0–15.0)
MCH: 31.8 pg (ref 26.0–34.0)
RBC: 4.03 MIL/uL (ref 3.87–5.11)

## 2012-12-21 LAB — RAPID URINE DRUG SCREEN, HOSP PERFORMED
Barbiturates: NOT DETECTED
Tetrahydrocannabinol: NOT DETECTED

## 2012-12-21 LAB — ACETAMINOPHEN LEVEL: Acetaminophen (Tylenol), Serum: <15 ug/mL (ref 10–30)

## 2012-12-21 MED ORDER — ONDANSETRON HCL 4 MG/2ML IJ SOLN
4.0000 mg | Freq: Once | INTRAMUSCULAR | Status: AC
Start: 2012-12-21 — End: 2012-12-21
  Administered 2012-12-21: 4 mg via INTRAVENOUS
  Filled 2012-12-21: qty 2

## 2012-12-21 NOTE — ED Notes (Signed)
Pt presenting to ed with c/o depression pt states suicide attempt pt states she took seroquel x 2 600mg , hydrocodone x 3 10mg , clonopin x 4 1mg , at 7:30am pt states very drowsy can't walk straight drove to church and was driving all over the road. Pt with c/o nausea no vomiting. Pt states she had appointment with psychiatrist and was told to present to ed

## 2012-12-21 NOTE — ED Notes (Signed)
MD at bedside. 

## 2012-12-21 NOTE — ED Notes (Addendum)
Poison control contacted, recommends adding a magnesium level and a liver function test, replacing calcium, potassium, and magnesium as needed if QTC widens out, continuing cardiac monitoring. PC states that seizures and cardiac events are unlikely but that monitoring is required. PC states that 6 hours is the seroquil peak. PC states that klonopin has an extremely long half life of 30-40 hours (and prolonged in overdose) and is most likely the cause of lethargy, which may continue over night. PC recommends holding patient until ED feels her mental status is appropriate to go home, pending lab results. PC states that charcoal will not help and that romazicon is not recommended due to risk of withdrawal seizures. PC representative is named deborah.

## 2012-12-21 NOTE — BH Assessment (Signed)
[facility] Assessment Progress Note      Received a call from pt's therapist, [name], stating that he was sending the patient to [facility] for evaluation.  He reports the patient is voicing SI and stated that she attempted suicide by overdose on 8/20.

## 2012-12-21 NOTE — ED Provider Notes (Signed)
CSN: [id number]     Arrival date & time 12/21/12  1730 History     First MD Initiated Contact with Patient 12/21/12 1815     Chief Complaint  Patient presents with  . Medical Clearance  . Drug Overdose   (Consider location/radiation/quality/duration/timing/severity/associated sxs/prior Treatment) HPI Patient presents after intentional ingestion of multiple medications in a suicide attempt.  She notes a long history of depression, worsening over the past few days. Approximately 2 hours prior to my evaluation she took hydrocodone, Seroquel, Klonopin.  Since that time she has had drowsiness, fatigue, without focal confusion, disorientation. She denies pain, diarrhea, vomiting. She is notable history of one prior suicide attempt, 30 years ago.  She also had psychiatric hospitalization at bedtime. She denies other current physical complaints. She denies other current psychiatric issues.  Past Medical History  Diagnosis Date  . Anemia   . Depression   . Eating disorder     anorexia nervosa in her 20's  . Fainting spell   . Kidney stones     passed on their own  . Hx of laminectomy 1994    L4-5  . Stomach disorder     due to complications with cholecystectomy "almost died"  . Anxiety    Past Surgical History  Procedure Laterality Date  . Cholecystectomy  08/2009    reports history of gallbladder polyps, she reports stents in duct of lushca   . Appendectomy  1976  . Tonsillectomy  1982  . Laminectomy  1994    L4-5  . Tubal ligation    . Hysteroscopy  07/2011   Family History  Problem Relation Age of Onset  . Hypertension Mother   . Diabetes Father   . Alcohol abuse Maternal Grandmother   . Cancer Paternal Grandfather     lung cancer  . Hashimoto's thyroiditis Sister    History  Substance Use Topics  . Smoking status: Never Smoker   . Smokeless tobacco: Never Used  . Alcohol Use: No   OB History   Grav Para Term Preterm Abortions TAB SAB Ect Mult Living        Review of Systems  All other systems reviewed and are negative.    Allergies  Eszopiclone; Flexeril; Gabapentin; Morphine and related; Nsaids; Paroxetine; Shellfish allergy; and Tizanidine hcl  Home Medications   Current Outpatient Rx  Name  Route  Sig  Dispense  Refill  . clonazePAM (KLONOPIN) 1 MG tablet   Oral   Take 1 mg by mouth 3 (three) times daily as needed. Pt takes 2 tablets 3 times daily.         Marland Kitchen HYDROcodone-acetaminophen (NORCO) 10-325 MG per tablet      Take 1 tablet four times daily as needed for back pain.          Marland Kitchen lithium carbonate 300 MG capsule   Oral   Take 900 mg by mouth at bedtime. Take one in the morning and two in the evening         . mirtazapine (REMERON) 15 MG tablet   Oral   Take 15 mg by mouth 2 (two) times daily.         Marland Kitchen PARoxetine (PAXIL) 30 MG tablet   Oral   Take 30 mg by mouth every morning.         . polyethylene glycol powder (GLYCOLAX/MIRALAX) powder   Oral   Take 17 g by mouth 2 (two) times daily. Until daily soft stools  OTC  255 g   0   . QUEtiapine (SEROQUEL) 300 MG tablet   Oral   Take 600 mg by mouth 2 (two) times daily.         Marland Kitchen zonisamide (ZONEGRAN) 100 MG capsule   Oral   Take 100 mg by mouth daily as needed (migraine).           BP 106/68  Pulse 79  Temp(Src) 98.1 F (36.7 C) (Oral)  Resp 20  SpO2 100%  LMP 12/04/2012 Physical Exam  Nursing note and vitals reviewed. Constitutional: She is oriented to person, place, and time. She appears well-developed and well-nourished. No distress.  HENT:  Head: Normocephalic and atraumatic.  Eyes: Conjunctivae and EOM are normal.  Cardiovascular: Normal rate and regular rhythm.   Pulmonary/Chest: Effort normal and breath sounds normal. No stridor. No respiratory distress.  Abdominal: She exhibits no distension.  Musculoskeletal: She exhibits no edema.  Neurological: She is alert and oriented to person, place, and time. No cranial nerve  deficit.  Skin: Skin is warm and dry.  Psychiatric: She has a normal mood and affect. Her speech is normal and behavior is normal. Judgment normal. Cognition and memory are normal. She expresses suicidal ideation. She expresses suicidal plans.    ED Course   Procedures (including critical care time)  Labs Reviewed  COMPREHENSIVE METABOLIC PANEL - Abnormal; Notable for the following:    Sodium 131 (*)    Potassium 3.4 (*)    AST 203 (*)    ALT 69 (*)    GFR calc non Af Amer 68 (*)    GFR calc Af Amer 79 (*)    All other components within normal limits  SALICYLATE LEVEL - Abnormal; Notable for the following:    Salicylate Lvl <2.0 (*)    All other components within normal limits  ACETAMINOPHEN LEVEL  CBC  ETHANOL  URINE RAPID DRUG SCREEN (HOSP PERFORMED)   No results found. No diagnosis found. Pulse ox 99% room air normal Cardiac 80 sinus rhythm normal EKG has sinus rhythm, rate 79, nonspecific T wave changes, otherwise unremarkable EKG.  After the initial evaluation, the patient had IV fluid resuscitation.  Given the passage of multiple hours since ingestion, the absence of decompensation, charcoal was not indicated.  I discussed her care w psych MDM  This patient with a history of depression presents after a suicide attempt.  Given the several hours since the attempt, the absence of decompensation, hemodynamic stability is all reassuring.  Patient is medically clear for further psychiatric evaluation.  Gerhard Munch, MD 12/21/12 2022

## 2012-12-22 ENCOUNTER — Encounter (HOSPITAL_COMMUNITY): Payer: Self-pay | Admitting: *Deleted

## 2012-12-22 ENCOUNTER — Inpatient Hospital Stay (HOSPITAL_COMMUNITY)
Admission: AD | Admit: 2012-12-22 | Discharge: 2012-12-27 | DRG: 430 | Disposition: A | Discharge status: Home / Self Care | Payer: BC Managed Care – PPO | Source: Ambulatory Visit | Attending: Psychiatry | Admitting: Psychiatry

## 2012-12-22 DIAGNOSIS — F314 Bipolar disorder, current episode depressed, severe, without psychotic features: Secondary | ICD-10-CM

## 2012-12-22 DIAGNOSIS — F339 Major depressive disorder, recurrent, unspecified: Secondary | ICD-10-CM | POA: Diagnosis present

## 2012-12-22 DIAGNOSIS — Z79899 Other long term (current) drug therapy: Secondary | ICD-10-CM

## 2012-12-22 DIAGNOSIS — F411 Generalized anxiety disorder: Secondary | ICD-10-CM | POA: Diagnosis present

## 2012-12-22 DIAGNOSIS — F331 Major depressive disorder, recurrent, moderate: Secondary | ICD-10-CM | POA: Diagnosis present

## 2012-12-22 DIAGNOSIS — F32A Depression, unspecified: Secondary | ICD-10-CM

## 2012-12-22 DIAGNOSIS — Z0000 Encounter for general adult medical examination without abnormal findings: Secondary | ICD-10-CM

## 2012-12-22 DIAGNOSIS — IMO0001 Reserved for inherently not codable concepts without codable children: Secondary | ICD-10-CM

## 2012-12-22 DIAGNOSIS — G8929 Other chronic pain: Secondary | ICD-10-CM

## 2012-12-22 DIAGNOSIS — G43909 Migraine, unspecified, not intractable, without status migrainosus: Secondary | ICD-10-CM

## 2012-12-22 DIAGNOSIS — F419 Anxiety disorder, unspecified: Secondary | ICD-10-CM

## 2012-12-22 DIAGNOSIS — F329 Major depressive disorder, single episode, unspecified: Secondary | ICD-10-CM

## 2012-12-22 DIAGNOSIS — R635 Abnormal weight gain: Secondary | ICD-10-CM

## 2012-12-22 DIAGNOSIS — K5900 Constipation, unspecified: Secondary | ICD-10-CM

## 2012-12-22 MED ORDER — MAGNESIUM HYDROXIDE 400 MG/5ML PO SUSP: 30.0000 mL | Freq: Every day | ORAL | Status: DC | PRN

## 2012-12-22 MED ORDER — ALUM & MAG HYDROXIDE-SIMETH 200-200-20 MG/5ML PO SUSP: 30.0000 mL | ORAL | Status: DC | PRN

## 2012-12-22 MED ORDER — MIRTAZAPINE 30 MG PO TABS
15.0000 mg | ORAL_TABLET | Freq: Two times a day (BID) | ORAL | Status: DC
  Administered 2012-12-22 (×2): 15 mg via ORAL
  Filled 2012-12-22: qty 1
  Filled 2012-12-22: qty 2

## 2012-12-22 MED ORDER — ZONISAMIDE 100 MG PO CAPS
100.0000 mg | ORAL_CAPSULE | Freq: Every day | ORAL | Status: DC | PRN
  Filled 2012-12-22: qty 1

## 2012-12-22 MED ORDER — ZONISAMIDE 100 MG PO CAPS
100.0000 mg | ORAL_CAPSULE | Freq: Every day | ORAL | Status: DC | PRN
  Filled 2012-12-22 (×2): qty 1

## 2012-12-22 MED ORDER — ACETAMINOPHEN 325 MG PO TABS
650.0000 mg | ORAL_TABLET | Freq: Four times a day (QID) | ORAL | Status: DC | PRN
  Administered 2012-12-23: 650 mg via ORAL

## 2012-12-22 MED ORDER — LITHIUM CARBONATE 300 MG PO CAPS
900.0000 mg | ORAL_CAPSULE | Freq: Every day | ORAL | Status: DC
  Administered 2012-12-22: 900 mg via ORAL
  Filled 2012-12-22: qty 3
  Filled 2012-12-22: qty 2

## 2012-12-22 MED ORDER — TRAZODONE HCL 50 MG PO TABS
50.0000 mg | ORAL_TABLET | Freq: Every evening | ORAL | Status: DC | PRN
  Administered 2012-12-22 – 2012-12-26 (×8): 50 mg via ORAL
  Filled 2012-12-22 (×14): qty 1

## 2012-12-22 MED ORDER — LITHIUM CARBONATE 300 MG PO CAPS
900.0000 mg | ORAL_CAPSULE | Freq: Every day | ORAL | Status: DC
  Administered 2012-12-22: 900 mg via ORAL
  Filled 2012-12-22 (×3): qty 3

## 2012-12-22 MED ORDER — PAROXETINE HCL 30 MG PO TABS
30.0000 mg | ORAL_TABLET | ORAL | Status: DC
Start: 2012-12-23 — End: 2012-12-23
  Administered 2012-12-23: 30 mg via ORAL
  Filled 2012-12-22 (×2): qty 1

## 2012-12-22 MED ORDER — QUETIAPINE FUMARATE 300 MG PO TABS
600.0000 mg | ORAL_TABLET | Freq: Two times a day (BID) | ORAL | Status: DC
  Administered 2012-12-22 (×2): 600 mg via ORAL
  Filled 2012-12-22 (×2): qty 2

## 2012-12-22 MED ORDER — ACETAMINOPHEN 325 MG PO TABS
650.0000 mg | ORAL_TABLET | Freq: Four times a day (QID) | ORAL | Status: DC | PRN
Start: 2012-12-22 — End: 2012-12-27
  Administered 2012-12-23 – 2012-12-27 (×7): 650 mg via ORAL

## 2012-12-22 MED ORDER — QUETIAPINE FUMARATE 300 MG PO TABS
600.0000 mg | ORAL_TABLET | Freq: Two times a day (BID) | ORAL | Status: DC
  Administered 2012-12-22: 600 mg via ORAL
  Administered 2012-12-23: 300 mg via ORAL
  Filled 2012-12-22 (×6): qty 2

## 2012-12-22 MED ORDER — PAROXETINE HCL 30 MG PO TABS
30.0000 mg | ORAL_TABLET | ORAL | Status: DC
  Administered 2012-12-22: 30 mg via ORAL
  Filled 2012-12-22: qty 1

## 2012-12-22 MED ORDER — CLONAZEPAM 0.5 MG PO TABS
1.0000 mg | ORAL_TABLET | Freq: Three times a day (TID) | ORAL | Status: DC | PRN
  Administered 2012-12-22: 1 mg via ORAL
  Filled 2012-12-22 (×2): qty 1

## 2012-12-22 NOTE — BHH Counselor (Signed)
Writer faxed support paperwork to the [facility] office.
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
## 2012-12-22 NOTE — Progress Notes (Signed)
Patient Identification:  Shannon [name] Date of Evaluation:  12/22/2012   History of Present Illness:   Female caucasian was brought in by her husband after she over dosed on her medications; Remeron, Hydrocodone, Seroquel and Klonopin.  She said she took all these pills "to be dead"  She reports she has been under a lot of stress lately at home with her 51 year old daughter leaving their house to live with her friend and her family.  Her other children left the house and now it is only her and her husband  In the house.  She also states she attempted suicide by OD at age 52 and since then she has been stable.  Patient also states " I have never ever loved me.  I hate myself but I pretend all is well"  She reports her husband is very supportive and she sees a therapist and a psychiatrist.  She is not able to contract for safety when asked if she is suicidal.  She denies HI/AVH and she did not exhibit paranoia.  We will admit her to our inpatient unit for safety and stabilization.  Past Psychiatric History:  Major depression.   Past Medical History:     Past Medical History  Diagnosis Date  . Anemia   . Depression   . Eating disorder     anorexia nervosa in her 20's  . Fainting spell   . Kidney stones     passed on their own  . Hx of laminectomy 1994    L4-5  . Stomach disorder     due to complications with cholecystectomy "almost died"  . Anxiety        Past Surgical History  Procedure Laterality Date  . Cholecystectomy  08/2009    reports history of gallbladder polyps, she reports stents in duct of lushca   . Appendectomy  1976  . Tonsillectomy  1982  . Laminectomy  1994    L4-5  . Tubal ligation    . Hysteroscopy  07/2011    Allergies:  Allergies  Allergen Reactions  . Eszopiclone     Headaches  . Flexeril [Cyclobenzaprine Hcl] Other (See Comments)    Mood swings  . Gabapentin     sedation  . Morphine And Related Other (See Comments)    No relief  . Nsaids Other  (See Comments)    GI bleed  . Paroxetine Nausea Only       . Shellfish Allergy Other (See Comments)    skin & mouth tingle  . Tizanidine Hcl Other (See Comments)    Dizziness, mood swings    Current Medications:  Prior to Admission medications   Medication Sig Start Date End Date Taking? Authorizing Provider  clonazePAM (KLONOPIN) 1 MG tablet Take 1 mg by mouth 3 (three) times daily as needed. Pt takes 2 tablets 3 times daily.   Yes Archer Asa, MD  HYDROcodone-acetaminophen (NORCO) 10-325 MG per tablet Take 1 tablet four times daily as needed for back pain.    Yes Historical Provider, MD  lithium carbonate 300 MG capsule Take 900 mg by mouth at bedtime. Take one in the morning and two in the evening 08/17/12  Yes Historical Provider, MD  mirtazapine (REMERON) 15 MG tablet Take 15 mg by mouth 2 (two) times daily.   Yes Historical Provider, MD  PARoxetine (PAXIL) 30 MG tablet Take 30 mg by mouth every morning.   Yes Historical Provider, MD  polyethylene glycol powder (GLYCOLAX/MIRALAX) powder Take 17  g by mouth 2 (two) times daily. Until daily soft stools  OTC 12/13/12  Yes Kaitlyn Szekalski, PA-C  QUEtiapine (SEROQUEL) 300 MG tablet Take 600 mg by mouth 2 (two) times daily.   Yes Historical Provider, MD  zonisamide (ZONEGRAN) 100 MG capsule Take 100 mg by mouth daily as needed (migraine).    Yes Historical Provider, MD    Social History:    reports that she has never smoked. She has never used smokeless tobacco. She reports that she does not drink alcohol or use illicit drugs.   Family History:    Family History  Problem Relation Age of Onset  . Hypertension Mother   . Diabetes Father   . Alcohol abuse Maternal Grandmother   . Cancer Paternal Grandfather     lung cancer  . Hashimoto's thyroiditis Sister     Mental Status Examination/Evaluation:  She is awake, alert and oriented x3.  She is cooperative and calm with poor eye contact during this assessment.  She reports her  mood to be depressed and her affect is ongruent. Her attention and concentration are normal and her thought process and content are significant for suicidal attempt and ideation.  Her judgement and insight are poor.   DIAGNOSIS:   AXIS I   Major depressive D/O, Recurrent, severe  AXIS II  Deffered  AXIS III See medical notes.  AXIS IV other psychosocial or environmental problems and problems related to social environment  AXIS V 41-50 serious symptoms     Assessment/Plan:  Consult and face to face interview with Dr Ladona Ridgel   We will seek inpatient psychiatric admission for safety and stability We will resume her psychiatric medications in the ER  While we wait for bed availability.

## 2012-12-22 NOTE — ED Notes (Signed)
Belongings sent home with husband.  

## 2012-12-22 NOTE — ED Notes (Signed)
Called to give report on pt, RNs are currently out with other pts.  Will return phone call.

## 2012-12-22 NOTE — ED Notes (Signed)
Pt is awake and alert, pleasant and cooperative. Patient denies HI, SI AH or VH. Discharge vitals 108/72 HR 85 RR 16 and unlabored. Pt has inpatient treatment scheduled at [facility]. Will continue to monitor for safety. Patient escorted to lobby without incident. T.Melvyn Neth RN

## 2012-12-22 NOTE — ED Notes (Signed)
Pt current status, waiting for bed in pysch ED.  Pt currently without a sitter. Diane CN made aware

## 2012-12-22 NOTE — Consult Note (Signed)
[facility] Psychiatry Consult   Reason for Consult:  Evaluation for IP psychiatric mgmt Referring Physician:  Jeraldine Loots MD  Shannon [name] is an 52 y.o. female.  Assessment: AXIS I:  MDD AXIS II:  No diagnosis AXIS III:   Past Medical History  Diagnosis Date  . Anemia   . Depression   . Eating disorder     anorexia nervosa in her 20's  . Fainting spell   . Kidney stones     passed on their own  . Hx of laminectomy 1994    L4-5  . Stomach disorder     due to complications with cholecystectomy "almost died"  . Anxiety    AXIS IV:  other psychosocial or environmental problems AXIS V:  11-20 some danger of hurting self or others possible OR occasionally fails to maintain minimal personal hygiene OR gross impairment in communication  Plan:  Recommend psychiatric Inpatient admission when medically cleared.  Subjective:   Shannon [name] is a 52 y.o. female patient presenting voluntarily due to SA i.e. O/D of hoarded pills over the past several days. The patient reportedly took hydrocodone 10/325 # 6, Seroquel 100 mg # 4, Klonopin 1 mg #6 and # 4 Lithium tabs at 7:30 am yesterday. The patient states after seeing a movie ( Heaven is for Real) she felt it was better to die and go on to heaven. The patient has a hx of prior SA and SI with intent to O/D The patient denies any HI or AVH. The patient is treated by Dr Shawnee Knapp MD  HPI:  HPI Elements:     Past Psychiatric History: Past Medical History  Diagnosis Date  . Anemia   . Depression   . Eating disorder     anorexia nervosa in her 20's  . Fainting spell   . Kidney stones     passed on their own  . Hx of laminectomy 1994    L4-5  . Stomach disorder     due to complications with cholecystectomy "almost died"  . Anxiety     reports that she has never smoked. She has never used smokeless tobacco. She reports that she does not drink alcohol or use illicit drugs. Family History  Problem Relation Age of Onset  . Hypertension Mother    . Diabetes Father   . Alcohol abuse Maternal Grandmother   . Cancer Paternal Grandfather     lung cancer  . Hashimoto's thyroiditis Sister            Allergies:   Allergies  Allergen Reactions  . Eszopiclone     Headaches  . Flexeril [Cyclobenzaprine Hcl] Other (See Comments)    Mood swings  . Gabapentin     sedation  . Morphine And Related Other (See Comments)    No relief  . Nsaids Other (See Comments)    GI bleed  . Paroxetine Nausea Only       . Shellfish Allergy Other (See Comments)    skin & mouth tingle  . Tizanidine Hcl Other (See Comments)    Dizziness, mood swings    Past Psychiatric History: Diagnosis:  MDD  Hospitalizations:  yes  Outpatient Care:  Dr Shawnee Knapp MD  Substance Abuse Care:  no  Self-Mutilation:  no  Suicidal Attempts:  yes  Violent Behaviors:  no   Objective: Blood pressure 98/44, pulse 80, temperature 97.8 F (36.6 C), temperature source Oral, resp. rate 15, last menstrual period 12/04/2012, SpO2 98.00%.There is no weight on file to calculate  BMI. Results for orders placed during the hospital encounter of 12/21/12 (from the past 72 hour(s))  ACETAMINOPHEN LEVEL     Status: None   Collection Time    12/21/12  7:18 PM      Result Value Range   Acetaminophen (Tylenol), Serum <15.0  10 - 30 ug/mL   Comment:            THERAPEUTIC CONCENTRATIONS VARY     SIGNIFICANTLY. A RANGE OF 10-30     ug/mL MAY BE AN EFFECTIVE     CONCENTRATION FOR MANY PATIENTS.     HOWEVER, SOME ARE BEST TREATED     AT CONCENTRATIONS OUTSIDE THIS     RANGE.     ACETAMINOPHEN CONCENTRATIONS     >150 ug/mL AT 4 HOURS AFTER     INGESTION AND >50 ug/mL AT 12     HOURS AFTER INGESTION ARE     OFTEN ASSOCIATED WITH TOXIC     REACTIONS.  CBC     Status: None   Collection Time    12/21/12  7:18 PM      Result Value Range   WBC 8.3  4.0 - 10.5 K/uL   RBC 4.03  3.87 - 5.11 MIL/uL   Hemoglobin 12.8  12.0 - 15.0 g/dL   HCT 54.0  98.1 - 19.1 %   MCV 93.8  78.0 -  100.0 fL   MCH 31.8  26.0 - 34.0 pg   MCHC 33.9  30.0 - 36.0 g/dL   RDW 47.8  29.5 - 62.1 %   Platelets 254  150 - 400 K/uL  COMPREHENSIVE METABOLIC PANEL     Status: Abnormal   Collection Time    12/21/12  7:18 PM      Result Value Range   Sodium 131 (*) 135 - 145 mEq/L   Potassium 3.4 (*) 3.5 - 5.1 mEq/L   Chloride 98  96 - 112 mEq/L   CO2 30  19 - 32 mEq/L   Glucose, Bld 93  70 - 99 mg/dL   BUN 7  6 - 23 mg/dL   Creatinine, Ser 3.08  0.50 - 1.10 mg/dL   Calcium 9.2  8.4 - 65.7 mg/dL   Total Protein 6.5  6.0 - 8.3 g/dL   Albumin 3.7  3.5 - 5.2 g/dL   AST 846 (*) 0 - 37 U/L   ALT 69 (*) 0 - 35 U/L   Alkaline Phosphatase 73  39 - 117 U/L   Total Bilirubin 0.4  0.3 - 1.2 mg/dL   GFR calc non Af Amer 68 (*) >90 mL/min   GFR calc Af Amer 79 (*) >90 mL/min   Comment: (NOTE)     The eGFR has been calculated using the CKD EPI equation.     This calculation has not been validated in all clinical situations.     eGFR's persistently <90 mL/min signify possible Chronic Kidney     Disease.  ETHANOL     Status: None   Collection Time    12/21/12  7:18 PM      Result Value Range   Alcohol, Ethyl (B) <11  0 - 11 mg/dL   Comment:            LOWEST DETECTABLE LIMIT FOR     SERUM ALCOHOL IS 11 mg/dL     FOR MEDICAL PURPOSES ONLY  SALICYLATE LEVEL     Status: Abnormal   Collection Time    12/21/12  7:18 PM  Result Value Range   Salicylate Lvl <2.0 (*) 2.8 - 20.0 mg/dL  URINE RAPID DRUG SCREEN (HOSP PERFORMED)     Status: Abnormal   Collection Time    12/21/12  8:52 PM      Result Value Range   Opiates POSITIVE (*) NONE DETECTED   Cocaine NONE DETECTED  NONE DETECTED   Benzodiazepines NONE DETECTED  NONE DETECTED   Amphetamines NONE DETECTED  NONE DETECTED   Tetrahydrocannabinol NONE DETECTED  NONE DETECTED   Barbiturates NONE DETECTED  NONE DETECTED   Comment:            DRUG SCREEN FOR MEDICAL PURPOSES     ONLY.  IF CONFIRMATION IS NEEDED     FOR ANY PURPOSE, NOTIFY LAB      WITHIN 5 DAYS.                LOWEST DETECTABLE LIMITS     FOR URINE DRUG SCREEN     Drug Class       Cutoff (ng/mL)     Amphetamine      1000     Barbiturate      200     Benzodiazepine   200     Tricyclics       300     Opiates          300     Cocaine          300     THC              50   Labs are reviewed and are pertinent for ( No critical lab values noted)  Current Facility-Administered Medications  Medication Dose Route Frequency Provider Last Rate Last Dose  . clonazePAM (KLONOPIN) tablet 1 mg  1 mg Oral TID PRN Gerhard Munch, MD      . lithium carbonate capsule 900 mg  900 mg Oral QHS Gerhard Munch, MD   900 mg at 12/22/12 0150  . mirtazapine (REMERON) tablet 15 mg  15 mg Oral BID Gerhard Munch, MD   15 mg at 12/22/12 0153  . PARoxetine (PAXIL) tablet 30 mg  30 mg Oral Steward Ros, MD      . QUEtiapine (SEROQUEL) tablet 600 mg  600 mg Oral BID Gerhard Munch, MD   600 mg at 12/22/12 0152  . zonisamide (ZONEGRAN) capsule 100 mg  100 mg Oral Daily PRN Gerhard Munch, MD       Current Outpatient Prescriptions  Medication Sig Dispense Refill  . clonazePAM (KLONOPIN) 1 MG tablet Take 1 mg by mouth 3 (three) times daily as needed. Pt takes 2 tablets 3 times daily.      Marland Kitchen HYDROcodone-acetaminophen (NORCO) 10-325 MG per tablet Take 1 tablet four times daily as needed for back pain.       Marland Kitchen lithium carbonate 300 MG capsule Take 900 mg by mouth at bedtime. Take one in the morning and two in the evening      . mirtazapine (REMERON) 15 MG tablet Take 15 mg by mouth 2 (two) times daily.      Marland Kitchen PARoxetine (PAXIL) 30 MG tablet Take 30 mg by mouth every morning.      . polyethylene glycol powder (GLYCOLAX/MIRALAX) powder Take 17 g by mouth 2 (two) times daily. Until daily soft stools  OTC  255 g  0  . QUEtiapine (SEROQUEL) 300 MG tablet Take 600 mg by mouth 2 (two) times daily.      Marland Kitchen  zonisamide (ZONEGRAN) 100 MG capsule Take 100 mg by mouth daily as needed  (migraine).         Psychiatric Specialty Exam:     Blood pressure 98/44, pulse 80, temperature 97.8 F (36.6 C), temperature source Oral, resp. rate 15, last menstrual period 12/04/2012, SpO2 98.00%.There is no weight on file to calculate BMI.  General Appearance: Casual  Eye Contact::  Good  Speech:  Slow  Volume:  Decreased  Mood:  Depressed  Affect:  Congruent  Thought Process:  Circumstantial  Orientation:  Full (Time, Place, and Person)  Thought Content:  Negative  Suicidal Thoughts:  Yes.  with intent/plan  Homicidal Thoughts:  No  Memory:  Immediate;   Fair  Judgement:  Poor  Insight:  Shallow  Psychomotor Activity:  Negative  Concentration:  Fair  Recall:  Fair  Akathisia:  Negative  Handed:  Right  AIMS (if indicated):     Assets:  Communication Skills  Sleep:      Treatment Plan Summary: 1) Patient is meeting IP criteria for crises mgmt, safety and stabilization of MDD, reccomend placement [address] [facility] pending bed 2) Social work to aid in OP support services and psychiatric mgmt once d/c from [facility] 3) Administration of psychotropics and psychotherapeutic interventions 4) Mgmt of co-morbid conditions  SIMON,SPENCER E 12/22/2012 4:38 AM  I agreed with the findings, treatment and disposition plan of this patient. Kathryne Sharper, MD

## 2012-12-23 ENCOUNTER — Encounter (HOSPITAL_COMMUNITY): Payer: Self-pay | Admitting: Psychiatry

## 2012-12-23 DIAGNOSIS — F339 Major depressive disorder, recurrent, unspecified: Secondary | ICD-10-CM | POA: Insufficient documentation

## 2012-12-23 DIAGNOSIS — F329 Major depressive disorder, single episode, unspecified: Secondary | ICD-10-CM | POA: Diagnosis present

## 2012-12-23 DIAGNOSIS — F331 Major depressive disorder, recurrent, moderate: Principal | ICD-10-CM

## 2012-12-23 HISTORY — DX: Major depressive disorder, single episode, unspecified: F32.9

## 2012-12-23 LAB — TSH: TSH: 2.613 u[IU]/mL (ref 0.350–4.500)

## 2012-12-23 LAB — LITHIUM LEVEL: Lithium Lvl: 1.43 mEq/L — ABNORMAL HIGH (ref 0.80–1.40)

## 2012-12-23 MED ORDER — CELECOXIB 200 MG PO CAPS
200.0000 mg | ORAL_CAPSULE | Freq: Every day | ORAL | Status: DC
  Administered 2012-12-23 – 2012-12-27 (×5): 200 mg via ORAL
  Filled 2012-12-23 (×6): qty 1

## 2012-12-23 MED ORDER — CLONIDINE HCL 0.1 MG PO TABS
0.1000 mg | ORAL_TABLET | Freq: Once | ORAL | Status: AC | PRN
  Administered 2012-12-23: 0.1 mg via ORAL

## 2012-12-23 MED ORDER — CLONAZEPAM 0.5 MG PO TABS
0.5000 mg | ORAL_TABLET | Freq: Three times a day (TID) | ORAL | Status: DC
  Administered 2012-12-23 – 2012-12-27 (×13): 0.5 mg via ORAL
  Filled 2012-12-23 (×13): qty 1

## 2012-12-23 MED ORDER — PAROXETINE HCL 20 MG PO TABS
40.0000 mg | ORAL_TABLET | Freq: Every day | ORAL | Status: DC
  Administered 2012-12-24 – 2012-12-27 (×4): 40 mg via ORAL
  Filled 2012-12-23 (×5): qty 2

## 2012-12-23 MED ORDER — QUETIAPINE FUMARATE 400 MG PO TABS
400.0000 mg | ORAL_TABLET | Freq: Every day | ORAL | Status: DC
  Administered 2012-12-23 – 2012-12-26 (×4): 400 mg via ORAL
  Filled 2012-12-23 (×5): qty 1

## 2012-12-23 MED ORDER — POLYETHYLENE GLYCOL 3350 17 G PO PACK
17.0000 g | PACK | Freq: Every day | ORAL | Status: DC
  Administered 2012-12-23 – 2012-12-27 (×5): 17 g via ORAL
  Filled 2012-12-23 (×6): qty 1

## 2012-12-23 NOTE — B[facility] Group Notes (Signed)
[facility] Group Notes: (Clinical Social Work)   12/23/2012      Type of Therapy:  Group Therapy   Participation Level:  Did Not Attend    Ambrose Mantle, LCSW 12/23/2012, 4:45 PM

## 2012-12-23 NOTE — Progress Notes (Signed)
Patient ID: Shannon [name], female   DOB: 1960-12-27, 52 y.o.   MRN: [id number]  D: Patient not feeling well since admission. Patient reports alot of feeling of body and legs aching. She has some stomach issues but that doesn't seem to be abnormal for her. Patient has been off her percocet x 2 days and has only gotten klonopin 1 time prior to admission here. Unknown if she may be having some possible withdrawals or if may be the effects from OD. No active SI. Signed 72hr notice for discharge. A: Staff will continue to monitor on q 15 minute checks, follow treatment plan, and give meds as ordered. R: Patient reassured and encouraged fluids. Fall plan precautions stressed.

## 2012-12-23 NOTE — B[facility] Suicide Risk Assessment (Signed)
Suicide Risk Assessment  Admission Assessment     Nursing information obtained from:  Patient Demographic factors:  Caucasian Current Mental Status:  Self-harm thoughts Loss Factors:    Historical Factors:    Risk Reduction Factors:  Sense of responsibility to family;Employed;Living with another person, especially a relative;Positive social support  CLINICAL FACTORS:   Depression:   Impulsivity  COGNITIVE FEATURES THAT CONTRIBUTE TO RISK:  Closed-mindedness Polarized thinking Thought constriction (tunnel vision)    SUICIDE RISK:   Moderate:  Frequent suicidal ideation with limited intensity, and duration, some specificity in terms of plans, no associated intent, good self-control, limited dysphoria/symptomatology, some risk factors present, and identifiable protective factors, including available and accessible social support.  PLAN OF CARE: Supportive approach/coping skills/relapse prevention                               CBT; Mindfulness                               Reassess medications   I certify that inpatient services furnished can reasonably be expected to improve the patient's condition.  Shannon [name] A 12/23/2012, 3:23 PM

## 2012-12-23 NOTE — Progress Notes (Signed)
Patient ID: Shannon [name], female   DOB: Nov 23, 1960, 52 y.o.   MRN: [id number]  D: Pt has been very flat and depressed on the unit. Pt has also been very tearful, reporting that she just felt lonely without her children. Pt reported that she is depressed and anxious, and that she feels like without her Klonopin she is going through withdrawal. Pt did attend groups and attempted to engage in treatment, but spent most of the time crying. Pt did report that her head felt like it was burning, and that she was in pain.  Verne Spurr PA made aware of patients concerns. Pt reported that she was negative SI/HI, no AH/VH noted. A: 15 min checks continued for patient safety. R: Pts safety maintained.

## 2012-12-23 NOTE — Progress Notes (Signed)
Adult Psychoeducational Group Note  Date:  12/23/2012 Time:  10:35 PM  Group Topic/Focus:  Wrap-Up Group:   The focus of this group is to help patients review their daily goal of treatment and discuss progress on daily workbooks.  Participation Level:  Active  Participation Quality:  Attentive  Affect:  Appropriate  Cognitive:  Appropriate  Insight: Good  Engagement in Group:  Engaged  Modes of Intervention:  Discussion  Additional Comments:  Pt was engaged and appropriate during group. She revealed that "everything has been a blur since Thursday" and she's hoping to leave tomorrow. She revealed that she was nervous about coming to [facility] but she thanked her peers for being so supportive and understanding.   Guilford Shi K 12/23/2012, 10:35 PM

## 2012-12-23 NOTE — Progress Notes (Signed)
Psychoeducational Group Note  Date: 12/23/2012 Time:  1015  Group Topic/Focus:  Identifying Needs:   The focus of this group is to help patients identify their personal needs that have been historically problematic and identify healthy behaviors to address their needs.  Participation Level:  Active  Participation Quality:  Appropriate  Affect:  Appropriate  Cognitive:  Oriented  Insight:  Improving  Engagement in Group:  Engaged  Additional Comments:  Pt participated in the group, supported her peers and was supported by them.   Dione Housekeeper

## 2012-12-23 NOTE — [facility] Group Notes (Signed)
[facility] Group Notes:  (Nursing/MHT/Case Management/Adjunct)  Date:  12/23/2012  Time:  12:37 PM  Type of Therapy:  Psychoeducational Skills  Participation Level:  Active  Participation Quality:  Appropriate  Affect:  Appropriate  Cognitive:  Appropriate  Insight:  Appropriate  Engagement in Group:  Engaged  Modes of Intervention:  Problem-solving  Summary of Progress/Problems: Pt did attend Healthy Coping Skills group, and engaged in treatment.  Estie Sproule Shanta 12/23/2012, 12:37 PM 

## 2012-12-23 NOTE — Progress Notes (Signed)
Pt states she does feel better than she did yesterday. She did state she is hopeful she can leave tomorrow to spend time with her grandchild. Pt requested to be able to stay in bed and rest as her legs were aching and she had a slight headache. Pt denies SI or HI and contracts for safety. She did request a pitcher of ice water and some yogurt.
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
## 2012-12-24 DIAGNOSIS — F332 Major depressive disorder, recurrent severe without psychotic features: Secondary | ICD-10-CM

## 2012-12-24 MED ORDER — LINACLOTIDE 145 MCG PO CAPS
290.0000 ug | ORAL_CAPSULE | Freq: Every day | ORAL | Status: DC
  Administered 2012-12-24 – 2012-12-26 (×3): 290 ug via ORAL
  Filled 2012-12-24 (×6): qty 2

## 2012-12-24 NOTE — Progress Notes (Signed)
Pt. Reports a good day. Pt. Denies SI and HI, rates her depression a 1.  Pt.'s only complaint is constipation which she has taken miralax, MOM , and prune juice with no relief.  Writer has an order from the PA for linzess which is what the pt. Takes at home.  We are still waiting for the pharmacy to bring the medication over to [facility] d/t the med being new on the market.
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
## 2012-12-24 NOTE — Progress Notes (Signed)
Patient ID: Shannon [name], female   DOB: 06/08/1960, 52 y.o.   MRN: [id number] Psychoeducational Group Note  Date:  12/24/2012 Time:  1100am  Group Topic/Focus:  Making Healthy Choices:   The focus of this group is to help patients identify negative/unhealthy choices they were using prior to admission and identify positive/healthier coping strategies to replace them upon discharge.  Participation Level:  Active  Participation Quality:  Appropriate  Affect:  Appropriate  Cognitive:  Appropriate  Insight:  Supportive  Engagement in Group:  Supportive  Additional Comments:    Valente David 12/24/2012,1:12 PM

## 2012-12-24 NOTE — BHH Group Notes (Signed)
BHH Group Notes:  (Clinical Social Work)  12/24/2012   3:00-4:00PM  Summary of Progress/Problems:   The main focus of today's process group was to   identify the patient's current support system and decide on other supports that can be put in place.  The picture on workbook was used to discuss why additional supports are needed, and a hand-out was distributed with four definitions/levels of support, then used to talk about how patients have given and received all different kinds of support.  An emphasis was placed on using counselor, doctor, therapy groups, 12-step groups, and problem-specific support groups to expand supports.  The patient identified her current supports as husband, 3 kids, parents, sister, and best friend.  She listened intently but did not talk during group.  Type of Therapy:  Process Group  Participation Level:  Active  Participation Quality:  Attentive  Affect:  Anxious  Cognitive:  Appropriate and Oriented  Insight:  Developing/Improving  Engagement in Therapy:  Engaged  Modes of Intervention:  Education,  Support and [employer], LCSW 12/24/2012, 4:55 PM

## 2012-12-24 NOTE — Progress Notes (Signed)
Writer spoke with patient 1;1 in her room after she had attended group this evening. Patient c/o being constipated and has had no bm for 5 days. Writer inquired about her appetite and she reports that she ate mostly vegetables at dinner but not much b/c she feels so bloated. Patient received prune juice and is compliant with her meds.Patient denies si/hi/a/v hallucinations. She is hoping to discharge soon Support and encouragement offered, safety maintained on unit, will continue to monitor with 15 min checks.

## 2012-12-24 NOTE — Progress Notes (Signed)
Adult Psychoeducational Group Note  Date:  12/24/2012 Time:  1:59 PM  Group Topic/Focus:  Conflict Resolution:   The focus of this group is to discuss the conflict resolution process and how it may be used upon discharge.  Participation Level:  Active  Participation Quality:  Appropriate  Affect:  Appropriate  Cognitive:  Appropriate  Insight: Appropriate  Engagement in Group:  Engaged  Modes of Intervention:  Activity    Elijio Miles 12/24/2012, 1:59 PM

## 2012-12-24 NOTE — Progress Notes (Signed)
Patient ID: Shannon [name], female   DOB: 27-Feb-1961, 52 y.o.   MRN: [id number]  D: Pt continues to be flat and depressed on the unit. Pt continues to report that she feels like she is going through withdrawal, and that she continues to be very anxious. Pt has not attended any groups and has been in the bed most of the day. Pt reported being negative SI/HI, no AH/VH noted.  A: 15 minute checks continued for safety. R: Pts safety maintained.

## 2012-12-24 NOTE — Progress Notes (Signed)
[facility] MD Progress Note  12/24/2012 10:45 PM Shannon [name]  MRN:  [id number] Subjective:  Patient reports poor sleep, nausea, and constipation. She reports feeling clearer in her thinking and feels that being off so much of the medication has been beneficial to her. She states she is no longer slurring her words and feels that being off the narcotics has been a positive move for her. Diagnosis:  MDD recurrent severe w/o psychosis DSM5: Schizophrenia Disorders:  NA Obsessive-Compulsive Disorders:  NA Trauma-Stressor Disorders:  NA Substance/Addictive Disorders:  Opioid dependence, benzo dependence Depressive Disorders:  Major Depressive Disorder - Severe (296.23)    ADL's:  Intact  Sleep: Poor  Appetite:  Poor  Suicidal Ideation:  Decreasing thoughts, no plan, no intent Homicidal Ideation:  denies AEB (as evidenced by):  Psychiatric Specialty Exam: Review of Systems  Constitutional: Negative.  Negative for fever, chills, weight loss, malaise/fatigue and diaphoresis.  HENT: Negative for congestion and sore throat.   Eyes: Negative for blurred vision, double vision and photophobia.  Respiratory: Negative for cough, shortness of breath and wheezing.   Cardiovascular: Negative for chest pain, palpitations and PND.  Gastrointestinal: Positive for nausea and constipation. Negative for heartburn, vomiting, abdominal pain and diarrhea.  Musculoskeletal: Negative for myalgias, joint pain and falls.  Neurological: Positive for headaches. Negative for dizziness, tingling, tremors, sensory change, speech change, focal weakness, seizures, loss of consciousness and weakness.  Endo/Heme/Allergies: Negative for polydipsia. Does not bruise/bleed easily.  Psychiatric/Behavioral: Negative for depression, suicidal ideas, hallucinations, memory loss and substance abuse. The patient is not nervous/anxious and does not have insomnia.     Blood pressure 107/72, pulse 83, temperature 97.1 F (36.2 C),  temperature source Oral, resp. rate 16, height 5\' 6"  (1.676 m), weight 68.04 kg (150 lb), last menstrual period 12/04/2012, SpO2 97.00%.Body mass index is 24.22 kg/(m^2).  General Appearance: Casual  Eye Contact::  Fair  Speech:  Clear and Coherent  Volume:  Normal  Mood:  Anxious and Depressed  Affect:  Congruent  Thought Process:  Goal Directed  Orientation:  Full (Time, Place, and Person)  Thought Content:  WDL  Suicidal Thoughts:  Yes.  without intent/plan  Homicidal Thoughts:  No  Memory:  Immediate;   Fair  Judgement:  Intact  Insight:  Shallow  Psychomotor Activity:  Decreased  Concentration:  Fair  Recall:  Fair  Akathisia:  No  Handed:  Right  AIMS (if indicated):     Assets:  Communication Skills Desire for Improvement Financial Resources/Insurance Housing Physical Health Resilience Social Support Talents/Skills Transportation Vocational/Educational  Sleep:  Number of Hours: 5.75   Current Medications: Current Facility-Administered Medications  Medication Dose Route Frequency Provider Last Rate Last Dose  . acetaminophen (TYLENOL) tablet 650 mg  650 mg Oral Q6H PRN Earney Navy, NP   650 mg at 12/23/12 2129  . acetaminophen (TYLENOL) tablet 650 mg  650 mg Oral Q6H PRN Kerry Hough, PA-C   650 mg at 12/24/12 0400  . alum & mag hydroxide-simeth (MAALOX/MYLANTA) 200-200-20 MG/5ML suspension 30 mL  30 mL Oral Q4H PRN Kerry Hough, PA-C      . celecoxib (CELEBREX) capsule 200 mg  200 mg Oral Daily Verne Spurr, PA-C   200 mg at 12/24/12 0745  . clonazePAM (KLONOPIN) tablet 0.5 mg  0.5 mg Oral Q8H Verne Spurr, PA-C   0.5 mg at 12/24/12 2112  . Linaclotide (LINZESS) capsule 290 mcg  290 mcg Oral Daily Nehemiah Settle, MD   290 mcg at  12/24/12 1951  . magnesium hydroxide (MILK OF MAGNESIA) suspension 30 mL  30 mL Oral Daily PRN Kerry Hough, PA-C      . PARoxetine (PAXIL) tablet 40 mg  40 mg Oral Daily Verne Spurr, PA-C   40 mg at 12/24/12  0745  . polyethylene glycol (MIRALAX / GLYCOLAX) packet 17 g  17 g Oral Daily Verne Spurr, PA-C   17 g at 12/24/12 0745  . QUEtiapine (SEROQUEL) tablet 400 mg  400 mg Oral QHS Verne Spurr, PA-C   400 mg at 12/24/12 2113  . traZODone (DESYREL) tablet 50 mg  50 mg Oral QHS,MR X 1 Kerry Hough, PA-C   50 mg at 12/24/12 2113    Lab Results:  Results for orders placed during the hospital encounter of 12/22/12 (from the past 48 hour(s))  TSH     Status: None   Collection Time    12/23/12  6:41 AM      Result Value Range   TSH 2.613  0.350 - 4.500 uIU/mL   Comment: Performed at [facility]  LITHIUM LEVEL     Status: Abnormal   Collection Time    12/23/12  6:41 AM      Result Value Range   Lithium Lvl 1.43 (*) 0.80 - 1.40 mEq/L   Comment: Performed at [facility]    Physical Findings: AIMS: Facial and Oral Movements Muscles of Facial Expression: None, normal Lips and Perioral Area: None, normal Jaw: None, normal Tongue: None, normal,Extremity Movements Upper (arms, wrists, hands, fingers): None, normal Lower (legs, knees, ankles, toes): None, normal, Trunk Movements Neck, shoulders, hips: None, normal, Overall Severity Severity of abnormal movements (highest score from questions above): None, normal Incapacitation due to abnormal movements: None, normal Patient's awareness of abnormal movements (rate only patient's report): No Awareness, Dental Status Current problems with teeth and/or dentures?: No Does patient usually wear dentures?: No  CIWA:    COWS:     Treatment Plan Summary: Daily contact with patient to assess and evaluate symptoms and progress in treatment Medication management  Plan: 1. Continue crisis management and stabilization. 2. Medication management to reduce current symptoms to base line and improve patient's overall level of functioning 3. Treat health problems as indicated. 4. Develop treatment plan to decrease risk of  relapse upon discharge and the need for     readmission. 5. Psycho-social education regarding relapse prevention and self care. 6. Health care follow up as needed for medical problems. 7. Continue home medications where appropriate. 8. Will add Linzess for constipation. 9. Patient is encouraged to participate in unit programming and to avoid isolation. 10. ELOS: 2-4 days  Medical Decision Making Problem Points:  Established problem, stable/improving (1) Data Points:  Review or order medicine tests (1)  I certify that inpatient services furnished can reasonably be expected to improve the patient's condition.  Rona Ravens. Jerrell Mangel RPAC 10:55 PM 12/24/2012

## 2012-12-24 NOTE — Progress Notes (Signed)
The focus of this group is to help patients review their daily goal of treatment and discuss progress on daily workbooks.  During wrap up group tonight, Shannon [name] shared that today was a great day for her because her physical pain was under control. Patient also stated that she enjoyed attending groups and interacting with her peers. When asked about what kind of support system the patient plans to use when discharged from the hospital, Shannon [name] stated that she will continue to see her psychiatrist and psychologist, and that she can lean on her family, friends, and pastor for support. 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

## 2012-12-24 NOTE — [facility] Group Notes (Signed)
[facility] Group Notes:  (Nursing/MHT/Case Management/Adjunct)  Date:  12/24/2012  Time:  11:04 AM  Type of Therapy:  Psychoeducational Skills  Participation Level:  Active  Participation Quality:  Appropriate  Affect:  Appropriate  Cognitive:  Appropriate  Insight:  Appropriate  Engagement in Group:  Engaged  Modes of Intervention:  Problem-solving  Summary of Progress/Problems: Pt did attend [employer] group, and engaged in treatment.  Shannon [name] 12/24/2012, 11:04 AM 

## 2012-12-25 MED ORDER — ONDANSETRON 4 MG PO TBDP
4.0000 mg | ORAL_TABLET | Freq: Three times a day (TID) | ORAL | Status: DC | PRN
  Administered 2012-12-25: 4 mg via ORAL

## 2012-12-25 MED ORDER — MAGNESIUM CITRATE PO SOLN
1.0000 | Freq: Once | ORAL | Status: AC
  Administered 2012-12-25: 1 via ORAL

## 2012-12-25 MED ORDER — BISACODYL 10 MG RE SUPP
10.0000 mg | Freq: Once | RECTAL | Status: AC
  Administered 2012-12-25: 10 mg via RECTAL
  Filled 2012-12-25 (×2): qty 1

## 2012-12-25 NOTE — [facility] Group Notes (Signed)
[facility] LCSW Aftercare Discharge Planning Group Note   12/25/2012   8:45 AM  Participation Quality:  Alert and Appropriate   Mood/Affect:  Appropriate and Calm  Depression Rating:  1  Anxiety Rating:  4  Thoughts of Suicide:  Pt denies SI/HI  Will you contract for safety?   Yes  Current AVH:  Pt denies  Plan for Discharge/Comments:  Pt attended discharge planning group and actively participated in group.  CSW provided pt with today's workbook.  Pt states that she came to the hospital after an overdose on her anxiety and depression medication.  Pt states that she feels better today though and wants to d/c today.  Pt states that she signed a 72 hour request for d/c, which she reports is up tonight and expects to d/c at that time.  Pt states that she lives in [place] and will return home to her family.  Pt reports seeing [name], NP at [facility] for medication management and [facility] for therapy.  CSW will schedule pt's follow up.  No further needs voiced by pt at this time.    Transportation Means: Pt reports access to transportation - family will pick pt up  Supports: No supports mentioned at this time  Reyes Ivan, LCSWA 12/25/2012 10:06 AM

## 2012-12-25 NOTE — Progress Notes (Signed)
Patient reported nausea and abdominal discomfort this evening which was reported to PA.  She is not vomiting and is tolerating fluids.  Ordered medications administered and instructed patient to inform RN of results.  Continue to assess.

## 2012-12-25 NOTE — [facility] Suicide Risk Assessment (Signed)
[facility] INPATIENT:  Family/Significant Other Suicide Prevention Education  Suicide Prevention Education:  Education Completed; Shannon [name] - father [phone]),  (name of family member/significant other) has been identified by the patient as the family member/significant other with whom the patient will be residing, and identified as the person(s) who will aid the patient in the event of a mental health crisis (suicidal ideations/suicide attempt).  With written consent from the patient, the family member/significant other has been provided the following suicide prevention education, prior to the and/or following the discharge of the patient.  The suicide prevention education provided includes the following:  Suicide risk factors  Suicide prevention and interventions  National Suicide Hotline telephone number  [facility] assessment telephone number  [place] Emergency Assistance 911  [facility] and/or Residential Mobile Crisis Unit telephone number  Request made of family/significant other to:  Remove weapons (e.g., guns, rifles, knives), all items previously/currently identified as safety concern.    Remove drugs/medications (over-the-counter, prescriptions, illicit drugs), all items previously/currently identified as a safety concern.  The family member/significant other verbalizes understanding of the suicide prevention education information provided.  The family member/significant other agrees to remove the items of safety concern listed above. Husband states that pt has been dealing with depression for the past few months ago but two recent events triggered the SI last week.  Husband states that the recent suicide of Shannon [name] and watching the movie Shannon [name] is for real were triggers for pt.  Husband states that he monitors her medication and states that the only reason was to be supportive and it was recommended by the outpatient NP.  Husband is concerned  about the amount of medication pt is taking.  Husband had no concerns for d/c.     Shannon [name] 12/25/2012, 11:01 AM

## 2012-12-25 NOTE — Progress Notes (Signed)
D: Patient resting in bed with eyes closed.  Respirations even and unlabored.  Patient appears to be in no apparent distress. A: Staff to monitor Q 15 mins for safety.   R:Patient remains safe on the unit.  

## 2012-12-25 NOTE — Tx Team (Signed)
Interdisciplinary Treatment Plan Update (Adult)  Date: 12/25/2012  Time Reviewed:  9:45 AM  Progress in Treatment: Attending groups: Yes Participating in groups:  Yes Taking medication as prescribed:  Yes Tolerating medication:  Yes Family/Significant othe contact made: CSW assessing  Patient understands diagnosis:  Yes Discussing patient identified problems/goals with staff:  Yes Medical problems stabilized or resolved:  Yes Denies suicidal/homicidal ideation: Yes Issues/concerns per patient self-inventory:  Yes Other:  New problem(s) identified: N/A  Discharge Plan or Barriers: CSW assessing for appropriate referrals. Pt states that she has follow up with [facility] and [facility] for medication management and therapy.    Reason for Continuation of Hospitalization: Anxiety Depression Medication Stabilization  Comments: N/A  Estimated length of stay: 1-2 days  For review of initial/current patient goals, please see plan of care.  Attendees: Patient:     Family:     Physician:  Dr. Javier Glazier 12/25/2012 10:34 AM   Nursing:   Neill Loft, RN 12/25/2012 10:34 AM   Clinical Social Worker:  Reyes Ivan, LCSWA 12/25/2012 10:34 AM   Other: Quintella Reichert, RN 12/25/2012 10:34 AM   Other:  Frankey Shown, MA care coordination 12/25/2012 10:34 AM   Other:  Juline Patch, LCSW 12/25/2012 10:34 AM   Other:     Other:    Other:    Other:    Other:    Other:    Other:     Scribe for Treatment Team:   Carmina Miller, 12/25/2012 10:34 AM

## 2012-12-25 NOTE — Progress Notes (Signed)
Patient ID: Shannon [name], female   DOB: February 15, 1961, 52 y.o.   MRN: [id number] [facility] MD Progress Note  12/25/2012 3:59 PM Latora Quarry  MRN:  [id number]  Subjective:  Patient has been suffering with major depressive disorder which was exacerbated since she has multiple stresses and his appointments. Patient endorses preplanned intentional overdose of her medications worth of 2 days with the intention to end her life. Patient has agree to rescind her 72 hours notice. She has been taking her medication as prescribed and participating in unit activities she is back she is learning few coping skills at this time. Patient stated she made this appointment and she cannot go home today and may cry but understands the need of staying in the hospital in focusing on her emotional strengths. Patient has poor sleep, nausea, and constipation has one bowel movement today after known. Patient has denied adverse effect of the medications and willing to continue medication as is. Patient husband is supportive to her and willing to supervise her medications.  Diagnosis:  MDD recurrent severe w/o psychosis DSM5: Schizophrenia Disorders:  NA Obsessive-Compulsive Disorders:  NA Trauma-Stressor Disorders:  NA Substance/Addictive Disorders:  Opioid dependence, benzo dependence  Depressive Disorders:  Major Depressive Disorder - Severe (296.23)  ADL's:  Intact  Sleep: Poor  Appetite:  Poor  Suicidal Ideation:  Decreasing thoughts, feels some what regets Homicidal Ideation:  denies AEB (as evidenced by):  Psychiatric Specialty Exam: Review of Systems  Constitutional: Negative.  Negative for fever, chills, weight loss, malaise/fatigue and diaphoresis.  HENT: Negative for congestion and sore throat.   Eyes: Negative for blurred vision, double vision and photophobia.  Respiratory: Negative for cough, shortness of breath and wheezing.   Cardiovascular: Negative for chest pain, palpitations and PND.   Gastrointestinal: Positive for nausea and constipation. Negative for heartburn, vomiting, abdominal pain and diarrhea.  Musculoskeletal: Negative for myalgias, joint pain and falls.  Neurological: Positive for headaches. Negative for dizziness, tingling, tremors, sensory change, speech change, focal weakness, seizures, loss of consciousness and weakness.  Endo/Heme/Allergies: Negative for polydipsia. Does not bruise/bleed easily.  Psychiatric/Behavioral: Negative for depression, suicidal ideas, hallucinations, memory loss and substance abuse. The patient is not nervous/anxious and does not have insomnia.     Blood pressure 97/62, pulse 89, temperature 98.1 F (36.7 C), temperature source Oral, resp. rate 16, height 5\' 6"  (1.676 m), weight 68.04 kg (150 lb), last menstrual period 12/04/2012, SpO2 97.00%.Body mass index is 24.22 kg/(m^2).  General Appearance: Casual  Eye Contact::  Fair  Speech:  Clear and Coherent  Volume:  Normal  Mood:  Anxious and Depressed  Affect:  Congruent  Thought Process:  Goal Directed  Orientation:  Full (Time, Place, and Person)  Thought Content:  WDL  Suicidal Thoughts:  Yes.  without intent/plan  Homicidal Thoughts:  No  Memory:  Immediate;   Fair  Judgement:  Intact  Insight:  Shallow  Psychomotor Activity:  Decreased  Concentration:  Fair  Recall:  Fair  Akathisia:  No  Handed:  Right  AIMS (if indicated):     Assets:  Communication Skills Desire for Improvement Financial Resources/Insurance Housing Physical Health Resilience Social Support Talents/Skills Transportation Vocational/Educational  Sleep:  Number of Hours: 6.25   Current Medications: Current Facility-Administered Medications  Medication Dose Route Frequency Provider Last Rate Last Dose  . acetaminophen (TYLENOL) tablet 650 mg  650 mg Oral Q6H PRN Earney Navy, NP   650 mg at 12/23/12 2129  . acetaminophen (TYLENOL) tablet 650 mg  650 mg Oral Q6H PRN Kerry Hough, PA-C    650 mg at 12/25/12 0330  . alum & mag hydroxide-simeth (MAALOX/MYLANTA) 200-200-20 MG/5ML suspension 30 mL  30 mL Oral Q4H PRN Kerry Hough, PA-C      . celecoxib (CELEBREX) capsule 200 mg  200 mg Oral Daily Verne Spurr, PA-C   200 mg at 12/25/12 4540  . clonazePAM (KLONOPIN) tablet 0.5 mg  0.5 mg Oral Q8H Verne Spurr, PA-C   0.5 mg at 12/25/12 1313  . Linaclotide (LINZESS) capsule 290 mcg  290 mcg Oral Daily Nehemiah Settle, MD   290 mcg at 12/25/12 0809  . magnesium hydroxide (MILK OF MAGNESIA) suspension 30 mL  30 mL Oral Daily PRN Kerry Hough, PA-C      . PARoxetine (PAXIL) tablet 40 mg  40 mg Oral Daily Verne Spurr, PA-C   40 mg at 12/25/12 9811  . polyethylene glycol (MIRALAX / GLYCOLAX) packet 17 g  17 g Oral Daily Verne Spurr, PA-C   17 g at 12/25/12 9147  . QUEtiapine (SEROQUEL) tablet 400 mg  400 mg Oral QHS Verne Spurr, PA-C   400 mg at 12/24/12 2113  . traZODone (DESYREL) tablet 50 mg  50 mg Oral QHS,MR X 1 Kerry Hough, PA-C   50 mg at 12/24/12 2113    Lab Results:  No results found for this or any previous visit (from the past 48 hour(s)).  Physical Findings: AIMS: Facial and Oral Movements Muscles of Facial Expression: None, normal Lips and Perioral Area: None, normal Jaw: None, normal Tongue: None, normal,Extremity Movements Upper (arms, wrists, hands, fingers): None, normal Lower (legs, knees, ankles, toes): None, normal, Trunk Movements Neck, shoulders, hips: None, normal, Overall Severity Severity of abnormal movements (highest score from questions above): None, normal Incapacitation due to abnormal movements: None, normal Patient's awareness of abnormal movements (rate only patient's report): No Awareness, Dental Status Current problems with teeth and/or dentures?: No Does patient usually wear dentures?: No  CIWA:    COWS:     Treatment Plan Summary: Daily contact with patient to assess and evaluate symptoms and progress in  treatment Medication management  Plan: 1. Continue crisis management and stabilization. 2. Medication management to reduce current symptoms to base line and improve patient's overall level of functioning 3. Treat health problems as indicated. 4. Develop treatment plan to decrease risk of relapse upon discharge and the need for     readmission. 5. Psycho-social education regarding relapse prevention and self care. 6. Health care follow up as needed for medical problems. 7. Continue home medications where appropriate. 8. Will add Linzess for constipation. 9. Patient is encouraged to participate in unit programming and to avoid isolation. 10. Patient will rescind her 72 hours notice and work with the program 11. ELOS: 2-3 days  Medical Decision Making Problem Points:  Established problem, stable/improving (1) Data Points:  Review or order medicine tests (1)  I certify that inpatient services furnished can reasonably be expected to improve the patient's condition.   Jazlyne Gauger,JANARDHAHA R. 12/25/2012 4:04 PM

## 2012-12-25 NOTE — Progress Notes (Signed)
Adult Psychoeducational Group Note  Date:  12/25/2012 Time:  9:54 PM  Group Topic/Focus:  Wrap-Up Group:   The focus of this group is to help patients review their daily goal of treatment and discuss progress on daily workbooks.  Participation Level:  Active  Participation Quality:  Appropriate and Attentive  Affect:  Appropriate  Cognitive:  Alert  Insight: Good  Engagement in Group:  Lacking  Modes of Intervention:  Discussion and Support  Additional Comments:    Reynolds Bowl 12/25/2012, 9:54 PM

## 2012-12-25 NOTE — Progress Notes (Signed)
Recreation Therapy Notes  Date: 08.25.2014 Time: 3:00pm Location: [address] Dayroom  Group Topic: Stress Management  Goal Area(s) Addresses:  Patient will verbalize importance of using healthy stress management.  Patient will identify stress management technique of choice.  Behavioral Response: Did not attend  Jearl Klinefelter, LRT/CTRS  Jearl Klinefelter 12/25/2012 4:15 PM

## 2012-12-25 NOTE — [facility] Group Notes (Signed)
[facility] LCSW Group Therapy  12/25/2012  1:15 PM   Type of Therapy:  Group Therapy  Participation Level:  Active  Participation Quality:  Appropriate and Attentive  Affect:  Appropriate and Calm  Cognitive:  Alert and Appropriate  Insight:  Developing/Improving and Engaged  Engagement in Therapy:  Developing/Improving and Engaged  Modes of Intervention:  Clarification, Confrontation, Discussion, Education, Exploration, Limit-setting, Orientation, Problem-solving, Rapport Building, Dance movement psychotherapist, Socialization and Support  Summary of Progress/Problems: Pt identified obstacles faced currently and processed barriers involved in overcoming these obstacles. Pt identified steps necessary for overcoming these obstacles and explored motivation (internal and external) for facing these difficulties head on. Pt further identified one area of concern in their lives and chose a goal to focus on for today.  Pt shared that she plans to overcome the obstacle of saying yes to everything.  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  Pt states that she plans to set boundaries and say no, so she can spend more time with her family.  Pt actively participated and was engaged in group discussion.    Shannon [name], [place] 12/25/2012 2:24 PM

## 2012-12-25 NOTE — Progress Notes (Signed)
Patient ID: Shannon [name], female   DOB: 1961/01/16, 52 y.o.   MRN: [id number] D-Patient reports  She slept fair and her appetite is improving.  Her energy level is normal and her ability to pay attention is improving.  She was hoping to be discharged today.  Her husband came to visit at lunchtime.  A- Talked with patient about her safety plan after discharge and the seriousness of he overdose attempt and what contributed to her feelings that led to this.  R- Patient says she got overwhelmed and was working 7 days a week from 8a to 8p most days.  [place]iel that she likes her work but was unable to say no to people .  She also says that she was on a lot of medication and felt like she was in a fog.  Says she feels much better now on less medication. She continues to be constipated .and uncomfortable.  She did have a bowel movement this am but feels she is still very constipated.  She has been attending groups and interacting with peers and staff.

## 2012-12-26 NOTE — Progress Notes (Signed)
Patient ID: Shannon [name], female   DOB: 04-18-1961, 52 y.o.   MRN: [id number] Gi Or Norman MD Progress Note  12/26/2012 12:56 PM Shannon [name]  MRN:  [id number]  Subjective:  Patient has pre planned serious intentional suicidal attempt with overdose on her psychiatric medications and her psychiatric provider and husband are concern about her safety. Patient has been less invested in her treatment and highly focussed on her discharge from the hospital and minimizes her symptoms of depression and anxiety. She has been participating on the unit activities with prompting or with mild self motivation. She has been compliant with her medication and last night has abdominal pain but no vomittings.  Reportedly she is having hard time to deal with small disappointments and I doubt if she can tolerated severe disappointments. She does not have strong emotions to deal with her multiple stresses. She has been learning coping skills to deal with her stresses out side while actively participating on the unit. Patient has slept about six hours last night. She was relieved her constipation, has four bowel movement today. Patient has denied adverse effect of the medications and willing to continue medication as is. Patient husband is planning to visit her during lunch time and will ask case manager to talk to him if time permits. She continues to benefit from her in patient hospital stay and but mostly has artificially enhanced mood and affect.  Diagnosis:  MDD recurrent severe w/o psychosis DSM5: Schizophrenia Disorders:  NA Obsessive-Compulsive Disorders:  NA Trauma-Stressor Disorders:  NA Substance/Addictive Disorders:  Opioid dependence, benzo dependence  Depressive Disorders:  Major Depressive Disorder - Severe (296.23)  ADL's:  Intact  Sleep: Poor  Appetite:  Poor  Suicidal Ideation:  Decreasing thoughts, feels some what regets Homicidal Ideation:  denies AEB (as evidenced by):  Psychiatric Specialty  Exam: Review of Systems  Constitutional: Negative.  Negative for fever, chills, weight loss, malaise/fatigue and diaphoresis.  HENT: Negative for congestion and sore throat.   Eyes: Negative for blurred vision, double vision and photophobia.  Respiratory: Negative for cough, shortness of breath and wheezing.   Cardiovascular: Negative for chest pain, palpitations and PND.  Gastrointestinal: Positive for nausea and constipation. Negative for heartburn, vomiting, abdominal pain and diarrhea.  Musculoskeletal: Negative for myalgias, joint pain and falls.  Neurological: Positive for headaches. Negative for dizziness, tingling, tremors, sensory change, speech change, focal weakness, seizures, loss of consciousness and weakness.  Endo/Heme/Allergies: Negative for polydipsia. Does not bruise/bleed easily.  Psychiatric/Behavioral: Negative for depression, suicidal ideas, hallucinations, memory loss and substance abuse. The patient is not nervous/anxious and does not have insomnia.     Blood pressure 104/70, pulse 79, temperature 97.5 F (36.4 C), temperature source Oral, resp. rate 17, height 5\' 6"  (1.676 m), weight 68.04 kg (150 lb), last menstrual period 12/04/2012, SpO2 97.00%.Body mass index is 24.22 kg/(m^2).  General Appearance: Casual  Eye Contact::  Fair  Speech:  Clear and Coherent  Volume:  Normal  Mood:  Anxious and Depressed  Affect:  Congruent  Thought Process:  Goal Directed  Orientation:  Full (Time, Place, and Person)  Thought Content:  WDL  Suicidal Thoughts:  Yes.  without intent/plan  Homicidal Thoughts:  No  Memory:  Immediate;   Fair  Judgement:  Intact  Insight:  Shallow  Psychomotor Activity:  Decreased  Concentration:  Fair  Recall:  Fair  Akathisia:  No  Handed:  Right  AIMS (if indicated):     Assets:  Communication Skills Desire for Improvement Financial Resources/Insurance Housing  Physical Health Resilience Social  Support Talents/Skills Transportation Vocational/Educational  Sleep:  Number of Hours: 6.25   Current Medications: Current Facility-Administered Medications  Medication Dose Route Frequency Provider Last Rate Last Dose  . acetaminophen (TYLENOL) tablet 650 mg  650 mg Oral Q6H PRN Kerry Hough, PA-C   650 mg at 12/26/12 1249  . alum & mag hydroxide-simeth (MAALOX/MYLANTA) 200-200-20 MG/5ML suspension 30 mL  30 mL Oral Q4H PRN Kerry Hough, PA-C      . celecoxib (CELEBREX) capsule 200 mg  200 mg Oral Daily Verne Spurr, PA-C   200 mg at 12/26/12 0750  . clonazePAM (KLONOPIN) tablet 0.5 mg  0.5 mg Oral Q8H Neil Mashburn, PA-C   0.5 mg at 12/26/12 0600  . Linaclotide (LINZESS) capsule 290 mcg  290 mcg Oral Daily Nehemiah Settle, MD   290 mcg at 12/26/12 0750  . magnesium hydroxide (MILK OF MAGNESIA) suspension 30 mL  30 mL Oral Daily PRN Kerry Hough, PA-C      . ondansetron (ZOFRAN-ODT) disintegrating tablet 4 mg  4 mg Oral Q8H PRN Kerry Hough, PA-C   4 mg at 12/25/12 2028  . PARoxetine (PAXIL) tablet 40 mg  40 mg Oral Daily Verne Spurr, PA-C   40 mg at 12/26/12 0748  . polyethylene glycol (MIRALAX / GLYCOLAX) packet 17 g  17 g Oral Daily Verne Spurr, PA-C   17 g at 12/26/12 0750  . QUEtiapine (SEROQUEL) tablet 400 mg  400 mg Oral QHS Verne Spurr, PA-C   400 mg at 12/25/12 2127  . traZODone (DESYREL) tablet 50 mg  50 mg Oral QHS,MR X 1 Spencer E Simon, PA-C   50 mg at 12/26/12 1610    Lab Results:  No results found for this or any previous visit (from the past 48 hour(s)).  Physical Findings: AIMS: Facial and Oral Movements Muscles of Facial Expression: None, normal Lips and Perioral Area: None, normal Jaw: None, normal Tongue: None, normal,Extremity Movements Upper (arms, wrists, hands, fingers): None, normal Lower (legs, knees, ankles, toes): None, normal, Trunk Movements Neck, shoulders, hips: None, normal, Overall Severity Severity of abnormal  movements (highest score from questions above): None, normal Incapacitation due to abnormal movements: None, normal Patient's awareness of abnormal movements (rate only patient's report): No Awareness, Dental Status Current problems with teeth and/or dentures?: No Does patient usually wear dentures?: No  CIWA:    COWS:     Treatment Plan Summary: Daily contact with patient to assess and evaluate symptoms and progress in treatment Medication management  Plan: 1. Continue crisis management and stabilization. 2. Medication management to reduce current symptoms to base line and improve patient's overall level of functioning, continue Seroquel, paxil and Klonopin 3. Treat health problems as indicated. 4. Develop treatment plan to decrease risk of relapse upon discharge and the need for     readmission. 5. Psycho-social education regarding relapse prevention and self care. 6. Health care follow up as needed for medical problems. 7. Continue home medications where appropriate. 8. Discontinue Linzess, continue Celebrex for pains 9. Patient is encouraged to participate in unit programming . 10. ELOS: 1-2 days.   Medical Decision Making Problem Points:  Established problem, stable/improving (1) Data Points:  Review or order medicine tests (1)  I certify that inpatient services furnished can reasonably be expected to improve the patient's condition.   Nehemiah Settle., MD 12/26/2012 12:56 PM

## 2012-12-26 NOTE — Progress Notes (Signed)
Patient ID: Shannon [name], female   DOB: 01-19-1961, 52 y.o.   MRN: [id number] Patient currently asleep; no s/s of distress noted at this time. Respirations regular and unlabored.

## 2012-12-26 NOTE — Progress Notes (Signed)
Recreation Therapy Notes  Date: 08.26.2014 Time: 2:45pm Location: [address] Dayroom  Group Topic: Animal Assisted Activities  Goal Area(s) Addresses:  Patient will interact appropriately with dog team.    Behavioral Response: Appropriate, Attentive, Engaged  Education: Coping Skill   Education Outcome: Acknowledges understanding  Clinical Observations/Feedback: Dog Team: [place] & handler. Patient interacted appropriately with peer, dog team and LRT.    Anaja Monts L Carlea Badour, LRT/CTRS  Jarad Barth L 12/26/2012 4:50 PM 

## 2012-12-26 NOTE — [facility] Group Notes (Signed)
[facility] LCSW Group Therapy      Feelings About Diagnosis 1:15 - 2:30 PM         12/26/2012  2:59 PM   Type of Therapy:  Group Therapy  Participation Level:  Active  Participation Quality:  Appropriate  Affect:  Appropriate  Cognitive:  Alert and Appropriate  Insight:  Developing/Improving and Engaged  Engagement in Therapy:  Developing/Improving and Engaged  Modes of Intervention:  Discussion, Education, Exploration, Problem-Solving, Rapport Building, Support  Summary of Progress/Problems:  Patient actively participated in group. Patient discussed past and present diagnosis and the effects it has had on  life.  Patient talked about family and society being judgmental and the stigma associated with having a mental health diagnosis.  Patient shared her diagnosis is not new to her and she accepts it.  Wynn Banker 12/26/2012  2:59 PM

## 2012-12-26 NOTE — Progress Notes (Signed)
The focus of this group is to educate the patient on the purpose and policies of crisis stabilization and provide a format to answer questions about their admission.  The group details unit policies and expectations of patients while admitted.  Patient attended 0900 nurse education orientation group this morning.  Patient actively participated, alert, appropriate insight and engagement.  Patient will work on goals for discharge today.

## 2012-12-26 NOTE — Progress Notes (Signed)
Adult Psychoeducational Group Note  Date:  12/26/2012 Time:  11:11 PM  Group Topic/Focus:  Goals Group:   The focus of this group is to help patients establish daily goals to achieve during treatment and discuss how the patient can incorporate goal setting into their daily lives to aide in recovery.  Participation Level:  Active  Participation Quality:  Appropriate  Affect:  Appropriate  Cognitive:  Appropriate  Insight: Appropriate  Engagement in Group:  Engaged  Modes of Intervention:  Discussion  Additional Comments:  Pt stated that day was good, she want to make sure that she has a good support system in place for discharge. She stated that she going to use all skills taught her so that she doesn't have to return.  Terie Purser R 12/26/2012, 11:11 PM

## 2012-12-26 NOTE — Progress Notes (Signed)
Patient ID: Shannon [name], female   DOB: 21-Feb-1961, 52 y.o.   MRN: [id number] D- Patient reports poor sleep and poor appetite.  Her energy level is normal and her ability to pay attention is good.  She is rating her depression and her anxiety at 1/10.  She is disappointed not to be going home today.  She visited with her husband at lunchtime and husband says that patient is the best he has seen he in years.  Patient is having results from the laxatives she has taken and is relieved to be feeling less uncomfortable.  Patient is appreciative of care received here.  She says she plans to stop taking on more than she should and she is planning to start saying no to maintain healthy boundaries and not overextend her work schedule.  She continues to be an active paticipant in groups.

## 2012-12-26 NOTE — Progress Notes (Signed)
Adult Psychoeducational Group Note  Date:  12/26/2012 Time:  12:27 PM  Group Topic/Focus:  Recovery Goals:   The focus of this group is to identify appropriate goals for recovery and establish a plan to achieve them.  Participation Level:  Active  Participation Quality:  Appropriate and Attentive  Affect:  Appropriate  Cognitive:  Alert and Appropriate  Insight: Appropriate and Improving  Engagement in Group:  Engaged  Modes of Intervention:  Discussion, Socialization and Support  Additional Comments:  Pt came to group and shared that medication mismanagement and working too much were standing between her and recovery. Pt plans on changing this by seeing her psychiatrist and psychologist for medications and noticing the early warning signs, such as going to bed too early and not being there for her child.  Shannon [name] 12/26/2012, 12:27 PM

## 2012-12-27 MED ORDER — POLYETHYLENE GLYCOL 3350 17 G PO PACK: 17.0000 g | PACK | Freq: Every day | ORAL | Status: DC

## 2012-12-27 MED ORDER — CELECOXIB 200 MG PO CAPS: 200.0000 mg | ORAL_CAPSULE | Freq: Every day | ORAL | Status: DC

## 2012-12-27 MED ORDER — TRAZODONE HCL 50 MG PO TABS: ORAL_TABLET | ORAL | Status: DC

## 2012-12-27 MED ORDER — PAROXETINE HCL 40 MG PO TABS: 40.0000 mg | ORAL_TABLET | Freq: Every day | ORAL | Status: DC

## 2012-12-27 MED ORDER — POLYETHYLENE GLYCOL 3350 17 GM/SCOOP PO POWD: 17.0000 g | Freq: Two times a day (BID) | ORAL | Status: DC

## 2012-12-27 MED ORDER — CLONAZEPAM 0.5 MG PO TABS: 0.5000 mg | ORAL_TABLET | Freq: Three times a day (TID) | ORAL | Status: DC

## 2012-12-27 MED ORDER — QUETIAPINE FUMARATE 400 MG PO TABS: 400.0000 mg | ORAL_TABLET | Freq: Every day | ORAL | Status: DC

## 2012-12-27 MED ORDER — TRAZODONE HCL 50 MG PO TABS
50.0000 mg | ORAL_TABLET | Freq: Every day | ORAL | Status: DC
  Filled 2012-12-27: qty 3

## 2012-12-27 NOTE — [facility] Suicide Risk Assessment (Signed)
Suicide Risk Assessment  Discharge Assessment     Demographic Factors:  Adolescent or young adult, Caucasian and Unemployed  Mental Status Per Nursing Assessment::   On Admission:  Self-harm thoughts  Current Mental Status by Physician: Patient is calm, pleasant and cooperate to. Patient has normal psychomotor activity. Patient is it good eye contact. Patient has normal speech and thought process. Patient has good mood with appropriate the right affect. Patient has no suicide or homicidal ideation, intentions or plans. Patient has no evidence of psychotic symptoms.  Loss Factors: Financial problems/change in socioeconomic status and Multiple stresses from the family  Historical Factors: Prior suicide attempts and Impulsivity  Risk Reduction Factors:   Sense of responsibility to family, Religious beliefs about death, Living with another person, especially a relative, Positive social support, Positive therapeutic relationship and Positive coping skills or problem solving skills  Continued Clinical Symptoms:  Bipolar Disorder:   Mixed State Depression:   Recent sense of peace/wellbeing Chronic Pain Previous Psychiatric Diagnoses and Treatments Medical Diagnoses and Treatments/Surgeries  Cognitive Features That Contribute To Risk:  Polarized thinking    Suicide Risk:  Minimal: No identifiable suicidal ideation.  Patients presenting with no risk factors but with morbid ruminations; may be classified as minimal risk based on the severity of the depressive symptoms  Discharge Diagnoses:   AXIS I:  Anxiety Disorder NOS and Bipolar, Depressed AXIS II:  Deferred AXIS III:   Past Medical History  Diagnosis Date  . Anemia   . Depression   . Eating disorder     anorexia nervosa in her 20's  . Fainting spell   . Kidney stones     passed on their own  . Hx of laminectomy 1994    L4-5  . Stomach disorder     due to complications with cholecystectomy "almost died"  . Anxiety     AXIS IV:  other psychosocial or environmental problems, problems related to social environment and problems with primary support group AXIS V:  61-70 mild symptoms  Plan Of Care/Follow-up recommendations:  Activity:  As dissected Diet:  Regular  Is patient on multiple antipsychotic therapies at discharge:  No   Has Patient had three or more failed trials of antipsychotic monotherapy by history:  No  Recommended Plan for Multiple Antipsychotic Therapies: NA  Nehemiah Settle., M.D. 12/27/2012, 12:49 PM

## 2012-12-27 NOTE — Progress Notes (Signed)
Agree with assessment and plan Karim Aiello A. Jadelyn Elks, M.D. 

## 2012-12-27 NOTE — Progress Notes (Signed)
D-pt sts she is under a lot of stress with her family life A-pt sts she is learning coping skill here that she will use when she is discharged R-offered encouragement and support

## 2012-12-27 NOTE — Discharge Summary (Signed)
Physician Discharge Summary Note  Patient:  Shannon [name] is an 52 y.o., female MRN:  [id number] DOB:  October 22, 1960 Patient phone:  [phone] (home)  Patient address:   [address] [place] [address],   Date of Admission:  12/22/2012 Date of Discharge: 12/27/2012  Reason for Admission:  Suicide attempt Discharge Diagnoses: Active Problems:   Major depression   Anxiety state, unspecified  Review of Systems  Constitutional: Negative.  Negative for fever, chills, weight loss, malaise/fatigue and diaphoresis.  HENT: Negative for congestion and sore throat.   Eyes: Negative for blurred vision, double vision and photophobia.  Respiratory: Negative for cough, shortness of breath and wheezing.   Cardiovascular: Negative for chest pain, palpitations and PND.  Gastrointestinal: Negative for heartburn, nausea, vomiting, abdominal pain, diarrhea and constipation.  Musculoskeletal: Positive for joint pain. Negative for myalgias and falls.  Neurological: Negative for dizziness, tingling, tremors, sensory change, speech change, focal weakness, seizures, loss of consciousness, weakness and headaches.  Endo/Heme/Allergies: Negative for polydipsia. Does not bruise/bleed easily.  Psychiatric/Behavioral: Negative for depression, suicidal ideas, hallucinations, memory loss and substance abuse. The patient is not nervous/anxious and does not have insomnia.     Axis Diagnosis:  AXIS I: MDD moderate recurrent w/o psychotic features  AXIS II: Deferred  AXIS III:  Past Medical History   Diagnosis  Date   .  Anemia    .  Depression    .  Eating disorder      anorexia nervosa in her 20's   .  Fainting spell    .  Kidney stones      passed on their own   .  Hx of laminectomy  1994     L4-5   .  Stomach disorder      due to complications with cholecystectomy "almost died"   .  Anxiety     AXIS IV: problems related to social environment  AXIS V: 51-60 moderate symptoms   Level of  Care:  OP  Hospital Course:  [name] was admitted after presenting to the hospital following an overdose of her medications. She was evaluated and felt to be in need of acute psychiatric hospitalization.        She was admitted to the unit and and evaluated and her symptoms were identified and treated. She was oriented to the unit and encouraged to participate in unit programming. [name] was felt to be over medicated and was also in the process of weaning herself off of her Norco which she had been on for years.       She was evaluated each day by a clinical provider to assess her response to treatment. [name] was also asked to complete a daily self inventory to monitor her mood and emotional status, sleep and pain.  She responded well to a therapeutic environment and a much decreased medication regimen. 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 She was well supported by her family during her hospital stay. She was not a problem and did not require 1:1 observation.         Her labs were reviewed and were unremarkable with the exception of a mildly low sodium and minimally low potassium. Both were felt to be likely to return to normal with good dietary intake. She was treated for constipation while here and with good results.   [name] was also treated with Celebrex 200mg  po per day for osteoarthritis pain with good results. It was  felt that even though she has a history of GI bleed, that with a PPI daily such as Pantoprazole, she would be able to use an NSAID for pain safely. She is encouraged to discuss this with her GI doctor or her PCP.       She was discharged home in much improved condition than upon arrival. She denied SI/HI, and no AVH. She was motivated to make the necessary changes in her lifestyle to decrease her stress level.         Consults:  None  Significant Diagnostic Studies:  None  Discharge Vitals:   Blood pressure 111/65,  pulse 92, temperature 97.6 F (36.4 C), temperature source Oral, resp. rate 18, height 5\' 6"  (1.676 m), weight 68.04 kg (150 lb), last menstrual period 12/04/2012, SpO2 97.00%. Body mass index is 24.22 kg/(m^2). Lab Results:   No results found for this or any previous visit (from the past 72 hour(s)).  Physical Findings: AIMS: Facial and Oral Movements Muscles of Facial Expression: None, normal Lips and Perioral Area: None, normal Jaw: None, normal Tongue: None, normal,Extremity Movements Upper (arms, wrists, hands, fingers): None, normal Lower (legs, knees, ankles, toes): None, normal, Trunk Movements Neck, shoulders, hips: None, normal, Overall Severity Severity of abnormal movements (highest score from questions above): None, normal Incapacitation due to abnormal movements: None, normal Patient's awareness of abnormal movements (rate only patient's report): No Awareness, Dental Status Current problems with teeth and/or dentures?: No Does patient usually wear dentures?: No  CIWA:    COWS:     Psychiatric Specialty Exam: See Psychiatric Specialty Exam and Suicide Risk Assessment completed by Attending Physician prior to discharge.  Discharge destination:  Home  Is patient on multiple antipsychotic therapies at discharge:  No   Has Patient had three or more failed trials of antipsychotic monotherapy by history:  No  Recommended Plan for Multiple Antipsychotic Therapies: NA  Discharge Orders   Future Orders Complete By Expires   Diet - low sodium heart healthy  As directed    Discharge instructions  As directed    Comments:     Take all of your medications as directed. Be sure to keep all of your follow up appointments.  If you are unable to keep your follow up appointment, call your Doctor's office to let them know, and reschedule.  Make sure that you have enough medication to last until your appointment. Be sure to get plenty of rest. Going to bed at the same time each night  will help. Try to avoid sleeping during the day.  Increase your activity as tolerated. Regular exercise will help you to sleep better and improve your mental health. Eating a heart healthy diet is recommended. Try to avoid salty or fried foods. Be sure to avoid all alcohol and illegal drugs.   Increase activity slowly  As directed        Medication List    STOP taking these medications       HYDROcodone-acetaminophen 10-325 MG per tablet  Commonly known as:  NORCO     lithium carbonate 300 MG capsule     zonisamide 100 MG capsule  Commonly known as:  ZONEGRAN      TAKE these medications     Indication   clonazePAM 0.5 MG tablet  Commonly known as:  KLONOPIN  Take 1 tablet (0.5 mg total) by mouth every 8 (eight) hours. For anxiety.   Indication:  Manic-Depression, for anxiety     PARoxetine 40 MG tablet  Commonly known as:  PAXIL  Take 1 tablet (40 mg total) by mouth daily. For anxiety and depression.   Indication:  Major Depressive Disorder     polyethylene glycol powder powder  Commonly known as:  GLYCOLAX/MIRALAX  - Take 17 g by mouth 2 (two) times daily. Until daily soft stools  -   - OTC for constipation and hard stools.   Indication:  Constipation     QUEtiapine 400 MG tablet  Commonly known as:  SEROQUEL  Take 1 tablet (400 mg total) by mouth at bedtime. For depression and insomnia.   Indication:  Depressive Phase of Manic-Depression     traZODone 50 MG tablet  Commonly known as:  DESYREL  Take one tablet at bedtime if needed for insomnia.   Indication:  Trouble Sleeping           Follow-up Information   Follow up with [facility] On 01/02/2013. (Appointment scheduled at 11:20 am with [name], PA for medication management)    Contact information:   [address] [place], [place] [address] Phone: [phone] Fax: [phone]      Follow up with [facility] On 01/02/2013. (Appointment scheduled at 2:00 pm with Vilinda Blanks for therapy)    Contact information:   [address]  [place], [place] [address] Phone: [phone] Fax: [phone]      Follow-up recommendations:   Activities: Resume activity as tolerated. Diet: Heart healthy low sodium diet Tests: Follow up testing will be determined by your out patient provider. Comments:   Deshanae should contact her PCP for use of NSAID with PPI for Osteoarthritis pain. Every effort should be made to slowly discontinue the Klonopin over time to avoid dependence and cognitive impairment.  Total Discharge Time:  Greater than 30 minutes.  Signed: Rona Ravens. Mashburn RPAC 11:25 AM 12/27/2012   The patient is seen face-to-face for psychiatric evaluation, treatment plan developed and case discussed with physician extended periodReviewed the information documented and agree with the treatment plan.   Delainie Chavana,JANARDHAHA R. 12/30/2012 7:29 PM

## 2012-12-27 NOTE — BHH Group Notes (Signed)
[facility] LCSW Aftercare Discharge Planning Group Note   12/27/2012   8:45 AM  Participation Quality:  Alert and Appropriate   Mood/Affect:  Appropriate and Calm  Depression Rating:  0  Anxiety Rating:  1-2  Thoughts of Suicide:  Pt denies SI/HI  Will you contract for safety?   Yes  Current AVH:  Pt denies  Plan for Discharge/Comments:  Pt attended discharge planning group and actively participated in group.  CSW provided pt with today's workbook.  Pt reports feeling stable to d/c today. Pt states that she lives in [facility] and will return home to her family.  Pt has follow up scheduled at [name], NP at [facility] for medication management and [facility] for therapy.  No further needs voiced by pt at this time.    Transportation Means: Pt reports access to transportation - family will pick pt up  Supports: No supports mentioned at this time  Reyes Ivan, LCSWA 12/27/2012 10:17 AM

## 2012-12-27 NOTE — Progress Notes (Signed)
Adult Psychoeducational Group Note  Date:  12/27/2012 Time:  11:01 AM  Group Topic/Focus:  Personal Choices and Values:   The focus of this group is to help patients assess and explore the importance of values in their lives, how their values affect their decisions, how they express their values and what opposes their expression.  Participation Level:  Active  Participation Quality:  Appropriate  Affect:  Appropriate  Cognitive:  Alert and Appropriate  Insight: Appropriate  Engagement in Group:  Engaged  Modes of Intervention:  Discussion  Additional Comments:  Today's group was a therapeutic activity. Pt attended group and was very engaged.    Gu[place]ford Shi K 12/27/2012, 11:01 AM

## 2012-12-27 NOTE — Progress Notes (Signed)
[facility] Adult Case Management Discharge Plan :  Will you be returning to the same living situation after discharge: Yes,  returning home At discharge, do you have transportation home?:Yes,  family will pick pt up Do you have the ability to pay for your medications:Yes,  access to meds  Release of information consent forms completed and in the chart;  Patient's signature needed at discharge.  Patient to Follow up at: Follow-up Information   Follow up with [facility] On 01/02/2013. (Appointment scheduled at 11:20 am with [name], PA for medication management)    Contact information:   [address] [place], [place] [address] Phone: [phone] Fax: [phone]      Follow up with [facility] On 01/02/2013. (Appointment scheduled at 2:00 pm with Vilinda Blanks for therapy)    Contact information:   [address]  [place], [place] [address] Phone: [phone] Fax: [phone]      Patient denies SI/HI:   Yes,  denies SI/HI    Safety Planning and Suicide Prevention discussed:  Yes,  discussed with pt and pt's husband.  See suicide prevention education note.   Shannon [name] 12/27/2012, 12:33 PM

## 2012-12-27 NOTE — Tx Team (Signed)
Interdisciplinary Treatment Plan Update (Adult)  Date: 12/27/2012  Time Reviewed:  9:45 AM  Progress in Treatment: Attending groups: Yes Participating in groups:  Yes Taking medication as prescribed:  Yes Tolerating medication:  Yes Family/Significant othe contact made: Yes Patient understands diagnosis:  Yes Discussing patient identified problems/goals with staff:  Yes Medical problems stabilized or resolved:  Yes Denies suicidal/homicidal ideation: Yes Issues/concerns per patient self-inventory:  Yes Other:  New problem(s) identified: N/A  Discharge Plan or Barriers: Pt will follow up at [facility] and [facility] for medication management and therapy.    Reason for Continuation of Hospitalization: Stable to d/c  Comments: N/A  Estimated length of stay: D/C today  For review of initial/current patient goals, please see plan of care.  Attendees: Patient:  Shannon [name]  12/27/2012 10:41 AM   Family:     Physician:  Dr. Javier Glazier 12/27/2012 10:37 AM   Nursing:   Harold Barban, RN 12/27/2012 10:37 AM   Clinical Social Worker:  Reyes Ivan, LCSWA 12/27/2012 10:37 AM   Other: Verne Spurr, PA 12/27/2012 10:37 AM   Other:  Frankey Shown, MA care coordination 12/27/2012 10:37 AM   Other:  Juline Patch, LCSW 12/27/2012 10:37 AM   Other:  Onnie Boer, RN 12/27/2012 10:37 AM   Other:    Other:    Other:    Other:    Other:    Other:     Scribe for Treatment Team:   Carmina Miller, 12/27/2012 10:37 AM

## 2012-12-27 NOTE — Progress Notes (Signed)
Adult Psychoeducational Group Note  Date:  12/27/2012 Time:  11:00AM Group Topic/Focus:  Personal Development  Participation Level:  Active  Participation Quality:  Appropriate and Attentive  Affect:  Appropriate  Cognitive:  Alert and Appropriate  Insight: Appropriate  Engagement in Group:  Engaged  Modes of Intervention:  Discussion  Additional Comments:   Pt. Was attentive and appropriate during today's group discussion. Pt. Was able to write down values. Pt was able to decide if the way you are living now in live with those values. Pt. Was able to break down values in groups values, what do i want to happen and wham am i going to do about it.  Bing Plume D 12/27/2012, 11:46 AM

## 2012-12-28 NOTE — Progress Notes (Signed)
Reviewed and agreed with 

## 2012-12-29 NOTE — Progress Notes (Signed)
Patient Discharge Instructions:  After Visit Summary (AVS):   Faxed to:  12/29/12 Discharge Summary Note:   Faxed to:  12/29/12 Psychiatric Admission Assessment Note:   Faxed to:  12/29/12 Suicide Risk Assessment - Discharge Assessment:   Faxed to:  12/29/12 Faxed/Sent to the Next Level Care provider:  12/29/12 Faxed to [facility] @ [phone] Faxed to [facility] @ [phone]  Jerelene Redden, 12/29/2012, 3:41 PM

## 2013-02-16 ENCOUNTER — Encounter: Payer: Self-pay | Admitting: Family

## 2013-02-19 ENCOUNTER — Ambulatory Visit (INDEPENDENT_AMBULATORY_CARE_PROVIDER_SITE_OTHER): Payer: BC Managed Care – PPO | Admitting: Family

## 2013-02-19 ENCOUNTER — Encounter: Payer: Self-pay | Admitting: Family

## 2013-02-19 VITALS — BP 100/70 | HR 89 | Temp 97.9°F | Resp 16 | Wt 148.0 lb

## 2013-02-19 DIAGNOSIS — R635 Abnormal weight gain: Secondary | ICD-10-CM

## 2013-02-19 DIAGNOSIS — F32A Depression, unspecified: Secondary | ICD-10-CM

## 2013-02-19 DIAGNOSIS — R413 Other amnesia: Secondary | ICD-10-CM

## 2013-02-19 DIAGNOSIS — G8929 Other chronic pain: Secondary | ICD-10-CM

## 2013-02-19 DIAGNOSIS — F329 Major depressive disorder, single episode, unspecified: Secondary | ICD-10-CM

## 2013-02-19 DIAGNOSIS — Z0000 Encounter for general adult medical examination without abnormal findings: Secondary | ICD-10-CM

## 2013-02-19 DIAGNOSIS — R42 Dizziness and giddiness: Secondary | ICD-10-CM | POA: Insufficient documentation

## 2013-02-19 DIAGNOSIS — K5900 Constipation, unspecified: Secondary | ICD-10-CM

## 2013-02-19 DIAGNOSIS — G43909 Migraine, unspecified, not intractable, without status migrainosus: Secondary | ICD-10-CM

## 2013-02-19 DIAGNOSIS — F341 Dysthymic disorder: Secondary | ICD-10-CM

## 2013-02-19 LAB — BASIC METABOLIC PANEL WITH GFR
GFR, Est African American: 88 mL/min
GFR, Est Non African American: 76 mL/min
Potassium: 4.1 mEq/L (ref 3.5–5.3)
Sodium: 136 mEq/L (ref 135–145)

## 2013-02-19 LAB — CBC WITH DIFFERENTIAL/PLATELET
Basophils Relative: 0 % (ref 0–1)
Hemoglobin: 13.4 g/dL (ref 12.0–15.0)
Lymphs Abs: 1.5 10*3/uL (ref 0.7–4.0)
Monocytes Relative: 13 % — ABNORMAL HIGH (ref 3–12)
Neutro Abs: 4.4 10*3/uL (ref 1.7–7.7)
Neutrophils Relative %: 62 % (ref 43–77)
RBC: 4.16 MIL/uL (ref 3.87–5.11)
WBC: 7 10*3/uL (ref 4.0–10.5)

## 2013-02-19 LAB — HEPATIC FUNCTION PANEL
AST: 13 U/L (ref 0–37)
Alkaline Phosphatase: 61 U/L (ref 39–117)
Bilirubin, Direct: 0.1 mg/dL (ref 0.0–0.3)
Total Bilirubin: 0.4 mg/dL (ref 0.3–1.2)

## 2013-02-19 LAB — VITAMIN B12: Vitamin B-12: 481 pg/mL (ref 211–911)

## 2013-02-19 LAB — LIPID PANEL
Cholesterol: 147 mg/dL (ref 0–200)
HDL: 68 mg/dL (ref >39)
Total CHOL/HDL Ratio: 2.2 Ratio
VLDL: 10 mg/dL (ref 0–40)

## 2013-02-19 NOTE — Assessment & Plan Note (Signed)
Improved. Defer management to psychiatry.

## 2013-02-19 NOTE — Assessment & Plan Note (Signed)
She is now off of narcotics and wishes to remain off of narcotics.  She is requesting referral to neurology for neck/back pain and migraines.  Will refer to Dr. Everlena Cooper.

## 2013-02-19 NOTE — Assessment & Plan Note (Signed)
This is being managed by GI- has upcoming colonoscopy.

## 2013-02-19 NOTE — Progress Notes (Signed)
Subjective:    Patient ID: Shannon [name], female    DOB: 12/10/1960, 52 y.o.   MRN: [id number]  HPI  Patient presents today for complete physical.  Immunizations: due for flu shot- declines today due to mild uri symptoms Diet: eats healthy diet Exercise: swims 1 mile a day Colonoscopy: will complete in 2 weeks Dexa: last dexa 2012 Pap Smear: up to date- reports 2 abnormal paps.  S/p ?coloposcopy- sees Julio Sicks NP/Michelle [facility] Mammogram: due- will complete in 2 weeks.   Dizziness- reports that she feels "off balance" forgetful.  Has issue with short term/long term memory issues.  Has been happening for 3-6 months.  Reports dizziness with standing suddenly.    Anxiety/depression- pt had inpatient psych admission in the end of August following a suicide attempt. Reports that she continues to have a lot of stress related to her middle child's drug issues.  [facility]  She is off of hydrocodone.  She reports that overall her depression and anxiety are improved.  Reports "good days and bad days"  Overall improved, though and she denies suicide ideation.    Memory- reports that she has issues recently with both short term and long term memory.  Weight gain- she attributes her weight gain to paxil.  Her weight is stable since her last visit here in April.  Review of Systems  Constitutional: Negative for unexpected weight change.  HENT:       Mild nasal congestion.  Eyes: Negative for visual disturbance.  Respiratory:       Mild sob/cp with anxiety  Cardiovascular:       Reports occasional mild ankle swelling  Gastrointestinal: Positive for constipation.       She sees GI- Dr. Norma Fredrickson  Genitourinary: Negative for dysuria and frequency.  Musculoskeletal: Positive for back pain and neck pain.  Neurological: Positive for dizziness.  Hematological: Negative for adenopathy.  Psychiatric/Behavioral:       See HPI    Past Medical History   Diagnosis Date  . Anemia   . Depression   . Eating disorder     anorexia nervosa in her 20's  . Fainting spell   . Kidney stones     passed on their own  . Hx of laminectomy 1994    L4-5  . Stomach disorder     due to complications with cholecystectomy "almost died"  . Anxiety     History   Social History  . Marital Status: Married    Spouse Name: N/A    Number of Children: 3  . Years of Education: N/A   Occupational History  .     Social History Main Topics  . Smoking status: Never Smoker   . Smokeless tobacco: Never Used  . Alcohol Use: No  . Drug Use: No  . Sexual Activity: Yes    Birth Control/ Protection: Surgical   Other Topics Concern  . Not on file   Social History Narrative   Regular exercise:  Yes (swim instructor)   Caffeine Use:  1 daily   Married 3 children ages 73 son, 12 son, and 45 daughter   Associates degree                Past Surgical History  Procedure Laterality Date  . Cholecystectomy  08/2009    reports history of gallbladder polyps, she reports stents in duct of lushca   . Appendectomy  1976  . Tonsillectomy  1982  . Laminectomy  1994    L4-5  . Tubal ligation    . Hysteroscopy  07/2011    Family History  Problem Relation Age of Onset  . Hypertension Mother   . Diabetes Father   . Hypertension Father   . Alcohol abuse Maternal Grandmother   . Cancer Paternal Grandfather     lung cancer  . Hashimoto's thyroiditis Sister     Allergies  Allergen Reactions  . Eszopiclone     Headaches  . Flexeril [Cyclobenzaprine Hcl] Other (See Comments)    Mood swings  . Gabapentin     sedation  . Morphine And Related Other (See Comments)    No relief  . Nsaids Other (See Comments)    GI bleed  . Paroxetine Nausea Only       . Shellfish Allergy Other (See Comments)    skin & mouth tingle  . Tizanidine Hcl Other (See Comments)    Dizziness, mood swings    Current Outpatient Prescriptions on File Prior to Visit   Medication Sig Dispense Refill  . clonazePAM (KLONOPIN) 0.5 MG tablet Take 1 tablet (0.5 mg total) by mouth every 8 (eight) hours. For anxiety.  90 tablet  0  . PARoxetine (PAXIL) 40 MG tablet Take 1 tablet (40 mg total) by mouth daily. For anxiety and depression.  30 tablet  0  . polyethylene glycol powder (GLYCOLAX/MIRALAX) powder Take 17 g by mouth 2 (two) times daily. Until daily soft stools  OTC for constipation and hard stools.  255 g  0  . QUEtiapine (SEROQUEL) 400 MG tablet Take 1 tablet (400 mg total) by mouth at bedtime. For depression and insomnia.  30 tablet  0  . traZODone (DESYREL) 50 MG tablet Take one tablet at bedtime if needed for insomnia.  30 tablet  0   No current facility-administered medications on file prior to visit.    BP 100/70  Pulse 89  Temp(Src) 97.9 F (36.6 C) (Oral)  Resp 16  Wt 148 lb (67.132 kg)  BMI 23.9 kg/m2  SpO2 99%  LMP 01/31/2013        Objective:   Physical Exam  Physical Exam  Constitutional: She is oriented to person, place, and time. She appears well-developed and well-nourished. No distress.  HENT:  Head: Normocephalic and atraumatic.  Right Ear: Tympanic membrane and ear canal normal.  Left Ear: Tympanic membrane and ear canal normal.  Mouth/Throat: Oropharynx is clear and moist.  Eyes: Pupils are equal, round, and reactive to light. No scleral icterus.  Neck: Normal range of motion. No thyromegaly present.  Cardiovascular: Normal rate and regular rhythm.   No murmur heard. Pulmonary/Chest: Effort normal and breath sounds normal. No respiratory distress. He has no wheezes. She has no rales. She exhibits no tenderness.  Abdominal: Soft. Bowel sounds are normal. He exhibits no distension and no mass. There is no tenderness. There is no rebound and no guarding.  Musculoskeletal: She exhibits no edema.  Lymphadenopathy:    She has no cervical adenopathy.  Neurological: She is alert and oriented to person, place, and time. She  exhibits normal muscle tone. Coordination normal.  Skin: Skin is warm and dry.  Psychiatric: She has a normal mood and affect. Her behavior is normal. Judgment and thought content normal.  Breasts: Examined lying Right: Without masses, retractions, discharge or axillary adenopathy.  Left: Without masses, retractions, discharge or axillary adenopathy.  Pelvic: deferred to GYN          Assessment & Plan:  Assessment & Plan:

## 2013-02-19 NOTE — Patient Instructions (Signed)
Please complete your lab work prior to leaving. Follow up in 3 months- sooner if problems/concerns.

## 2013-02-19 NOTE — Assessment & Plan Note (Signed)
Weight has been stable since last visit. Monitor.

## 2013-02-19 NOTE — Assessment & Plan Note (Signed)
I suspect mild orthostatic hypotension due to serqouel.  Per pt, she tells me that psych is weaning down her dose.  Will see how she does with wean.

## 2013-02-19 NOTE — Assessment & Plan Note (Signed)
Suspect related to anxiety. Will check B12, folate, RPR.  Consider MMSE next visit if not improved.

## 2013-02-19 NOTE — Assessment & Plan Note (Signed)
We discussed healthy diet, exercise.  Declines flu shot.  Will complete mammogram.  Obtain fasting lab work.

## 2013-02-20 ENCOUNTER — Encounter: Payer: Self-pay | Admitting: Family

## 2013-02-21 ENCOUNTER — Telehealth: Payer: Self-pay | Admitting: *Deleted

## 2013-02-21 ENCOUNTER — Encounter: Payer: Self-pay | Admitting: Family

## 2013-02-21 NOTE — Telephone Encounter (Signed)
Left message for pt to return my call.

## 2013-02-21 NOTE — Telephone Encounter (Signed)
Received message from pt requesting recent lab results. Also states she forgot to mention some concerns at her last visit.   1.  Has ringing in her ears 2.  Bruises easily.  Has 8-10 bruises on her legs. Feels like she bruises at the slightest touch. 3.  Always feels cold.  Left message for pt to return my call with additional information.

## 2013-02-21 NOTE — Telephone Encounter (Signed)
Received call from [facility] lab that they did not receive urine specimen for test that was ordered. Provider was already aware as pt voided before test was ordered and test was cancelled.

## 2013-02-21 NOTE — Telephone Encounter (Signed)
Patient returned phone call. Best # [phone]

## 2013-02-22 NOTE — Telephone Encounter (Signed)
Patient called again to request labs and stated that she needed to update her current issues:  1. Having Tinnitus 2-3 months: No SOB 2. Having Dizziness & Balancing issues 3. Bruising easily w/multiple bruises on legs 4. Can't tolerate cold: always cold

## 2013-02-23 NOTE — Telephone Encounter (Signed)
Attempted to reach pt and left message that she should schedule an appointment for further evaluation of these symptoms.

## 2013-03-07 ENCOUNTER — Ambulatory Visit: Payer: Self-pay | Admitting: Neurology

## 2013-03-10 ENCOUNTER — Emergency Department (HOSPITAL_BASED_OUTPATIENT_CLINIC_OR_DEPARTMENT_OTHER)
Admission: EM | Admit: 2013-03-10 | Discharge: 2013-03-10 | Disposition: A | Discharge status: Home / Self Care | Payer: BC Managed Care – PPO | Attending: Emergency Medicine | Admitting: Emergency Medicine

## 2013-03-10 ENCOUNTER — Encounter (HOSPITAL_BASED_OUTPATIENT_CLINIC_OR_DEPARTMENT_OTHER): Payer: Self-pay | Admitting: Emergency Medicine

## 2013-03-10 DIAGNOSIS — Z87442 Personal history of urinary calculi: Secondary | ICD-10-CM | POA: Insufficient documentation

## 2013-03-10 DIAGNOSIS — F411 Generalized anxiety disorder: Secondary | ICD-10-CM | POA: Insufficient documentation

## 2013-03-10 DIAGNOSIS — Z79899 Other long term (current) drug therapy: Secondary | ICD-10-CM | POA: Insufficient documentation

## 2013-03-10 DIAGNOSIS — Z8719 Personal history of other diseases of the digestive system: Secondary | ICD-10-CM | POA: Insufficient documentation

## 2013-03-10 DIAGNOSIS — G43909 Migraine, unspecified, not intractable, without status migrainosus: Secondary | ICD-10-CM | POA: Insufficient documentation

## 2013-03-10 DIAGNOSIS — Z862 Personal history of diseases of the blood and blood-forming organs and certain disorders involving the immune mechanism: Secondary | ICD-10-CM | POA: Insufficient documentation

## 2013-03-10 HISTORY — DX: Migraine, unspecified, not intractable, without status migrainosus: G43.909

## 2013-03-10 MED ORDER — PROMETHAZINE HCL 25 MG/ML IJ SOLN
25.0000 mg | Freq: Once | INTRAMUSCULAR | Status: AC
  Administered 2013-03-10: 25 mg via INTRAVENOUS
  Filled 2013-03-10: qty 1

## 2013-03-10 MED ORDER — DIPHENHYDRAMINE HCL 50 MG/ML IJ SOLN
25.0000 mg | Freq: Once | INTRAMUSCULAR | Status: AC
  Administered 2013-03-10: 25 mg via INTRAVENOUS
  Filled 2013-03-10: qty 1

## 2013-03-10 MED ORDER — HYDROMORPHONE HCL PF 1 MG/ML IJ SOLN
1.0000 mg | Freq: Once | INTRAMUSCULAR | Status: AC
  Administered 2013-03-10: 1 mg via INTRAVENOUS
  Filled 2013-03-10: qty 1

## 2013-03-10 MED ORDER — DEXAMETHASONE SODIUM PHOSPHATE 10 MG/ML IJ SOLN
10.0000 mg | Freq: Once | INTRAMUSCULAR | Status: AC
  Administered 2013-03-10: 10 mg via INTRAVENOUS
  Filled 2013-03-10: qty 1

## 2013-03-10 MED ORDER — KETOROLAC TROMETHAMINE 30 MG/ML IJ SOLN
30.0000 mg | Freq: Once | INTRAMUSCULAR | Status: AC
  Administered 2013-03-10: 30 mg via INTRAVENOUS
  Filled 2013-03-10: qty 1

## 2013-03-10 MED ORDER — SODIUM CHLORIDE 0.9 % IV BOLUS (SEPSIS)
1000.0000 mL | Freq: Once | INTRAVENOUS | Status: AC
  Administered 2013-03-10: 1000 mL via INTRAVENOUS

## 2013-03-10 NOTE — ED Provider Notes (Signed)
CSN: [id number]     Arrival date & time 03/10/13  1759 History   First MD Initiated Contact with Patient 03/10/13 1940     Chief Complaint  Patient presents with  . Migraine   (Consider location/radiation/quality/duration/timing/severity/associated sxs/prior Treatment) HPI Comments: Patient is a 52 year old female with a past medical history of chronic migraines who presents with a headache for 1 day. Patient reports a gradual onset and progressive worsening of the headache that started after receiving propofol for her colonoscopy. The pain is sharp, constant and is located in generalized head without radiation. Patient has tried Imitrex and Zonegran for symptoms without relief. No alleviating/aggravating factors. Patient reports associated nausea and photophobia. Patient denies fever, vomiting, diarrhea, numbness/tingling, weakness, visual changes, congestion, chest pain, SOB, abdominal pain.     Patient is a 52 y.o. female presenting with migraines.  Migraine Associated symptoms include headaches and nausea.    Past Medical History  Diagnosis Date  . Anemia   . Depression   . Eating disorder     anorexia nervosa in her 20's  . Fainting spell   . Kidney stones     passed on their own  . Hx of laminectomy 1994    L4-5  . Stomach disorder     due to complications with cholecystectomy "almost died"  . Anxiety   . Migraine    Past Surgical History  Procedure Laterality Date  . Cholecystectomy  08/2009    reports history of gallbladder polyps, she reports stents in duct of lushca   . Appendectomy  1976  . Tonsillectomy  1982  . Laminectomy  1994    L4-5  . Tubal ligation    . Hysteroscopy  07/2011   Family History  Problem Relation Age of Onset  . Hypertension Mother   . Diabetes Father   . Hypertension Father   . Alcohol abuse Maternal Grandmother   . Cancer Paternal Grandfather     lung cancer  . Hashimoto's thyroiditis Sister    History  Substance Use Topics  .  Smoking status: Never Smoker   . Smokeless tobacco: Never Used  . Alcohol Use: No   OB History   Grav Para Term Preterm Abortions TAB SAB Ect Mult Living                 Review of Systems  Gastrointestinal: Positive for nausea.  Neurological: Positive for headaches.  All other systems reviewed and are negative.    Allergies  Eszopiclone; Flexeril; Gabapentin; Morphine and related; Nsaids; Paroxetine; Shellfish allergy; and Tizanidine hcl  Home Medications   Current Outpatient Rx  Name  Route  Sig  Dispense  Refill  . busPIRone (BUSPAR) 15 MG tablet   Oral   Take 30 mg by mouth 2 (two) times daily.         . clonazePAM (KLONOPIN) 0.5 MG tablet   Oral   Take 1 tablet (0.5 mg total) by mouth every 8 (eight) hours. For anxiety.   90 tablet   0   . Lurasidone HCl (LATUDA) 60 MG TABS   Oral   Take 1 tablet by mouth every evening.         Marland Kitchen PARoxetine (PAXIL) 40 MG tablet   Oral   Take 1 tablet (40 mg total) by mouth daily. For anxiety and depression.   30 tablet   0   . polyethylene glycol powder (GLYCOLAX/MIRALAX) powder   Oral   Take 17 g by mouth 2 (two)  times daily. Until daily soft stools  OTC for constipation and hard stools.   255 g   0   . SUMAtriptan (IMITREX) 100 MG tablet   Oral   Take 100 mg by mouth every 2 (two) hours as needed for migraine or headache. May repeat in 2 hours if headache persists or recurs.         Marland Kitchen zonisamide (ZONEGRAN) 100 MG capsule   Oral   Take 200 mg by mouth 2 (two) times daily.         . QUEtiapine (SEROQUEL) 400 MG tablet   Oral   Take 1 tablet (400 mg total) by mouth at bedtime. For depression and insomnia.   30 tablet   0   . traZODone (DESYREL) 50 MG tablet      Take one tablet at bedtime if needed for insomnia.   30 tablet   0    BP 101/52  Pulse 63  Temp(Src) 98.7 F (37.1 C) (Oral)  Resp 18  Ht 5\' 6"  (1.676 m)  Wt 140 lb (63.504 kg)  BMI 22.61 kg/m2  SpO2 98%  LMP 03/03/2013 Physical Exam   Nursing note and vitals reviewed. Constitutional: She is oriented to person, place, and time. She appears well-developed and well-nourished. No distress.  HENT:  Head: Normocephalic and atraumatic.  Eyes: Conjunctivae and EOM are normal.  Neck: Normal range of motion.  Cardiovascular: Normal rate and regular rhythm.  Exam reveals no gallop and no friction rub.   No murmur heard. Pulmonary/Chest: Effort normal and breath sounds normal. She has no wheezes. She has no rales. She exhibits no tenderness.  Musculoskeletal: Normal range of motion.  Neurological: She is alert and oriented to person, place, and time. Coordination normal.  No meningeal signs. Speech is goal-oriented. Moves limbs without ataxia.   Skin: Skin is warm and dry.  Psychiatric: She has a normal mood and affect. Her behavior is normal.    ED Course  Procedures (including critical care time) Labs Review Labs Reviewed - No data to display Imaging Review No results found.  EKG Interpretation   None       MDM   1. Migraine     8:53 PM Patient received fluids, toradol, phenergan, and benadryl. No meningeal signs. Vitals stable and patient afebrile. Patient will have dilaudid and decadron for pain. \  10:32 PM Patient feeling better and will be discharged with recommended follow up with her PCP. Vitals stable and patient afebrile. No further evaluation needed at this time.     Emilia Beck, PA-C 03/10/13 2233

## 2013-03-10 NOTE — ED Provider Notes (Signed)
Medical screening examination/treatment/procedure(s) were performed by non-physician practitioner and as supervising physician I was immediately available for consultation/collaboration.  EKG Interpretation   None        Lindie Roberson, MD 03/10/13 2344 

## 2013-03-10 NOTE — ED Notes (Signed)
Pt reports mha x 1 day after receiving propofol for colonoscopy. +nausea

## 2013-03-11 ENCOUNTER — Encounter (HOSPITAL_BASED_OUTPATIENT_CLINIC_OR_DEPARTMENT_OTHER): Payer: Self-pay | Admitting: Emergency Medicine

## 2013-03-11 ENCOUNTER — Emergency Department (HOSPITAL_BASED_OUTPATIENT_CLINIC_OR_DEPARTMENT_OTHER)
Admission: EM | Admit: 2013-03-11 | Discharge: 2013-03-11 | Disposition: A | Discharge status: Home / Self Care | Payer: BC Managed Care – PPO | Attending: Emergency Medicine | Admitting: Emergency Medicine

## 2013-03-11 DIAGNOSIS — F329 Major depressive disorder, single episode, unspecified: Secondary | ICD-10-CM | POA: Insufficient documentation

## 2013-03-11 DIAGNOSIS — Z8719 Personal history of other diseases of the digestive system: Secondary | ICD-10-CM | POA: Insufficient documentation

## 2013-03-11 DIAGNOSIS — F3289 Other specified depressive episodes: Secondary | ICD-10-CM | POA: Insufficient documentation

## 2013-03-11 DIAGNOSIS — G43909 Migraine, unspecified, not intractable, without status migrainosus: Secondary | ICD-10-CM | POA: Insufficient documentation

## 2013-03-11 DIAGNOSIS — Z87442 Personal history of urinary calculi: Secondary | ICD-10-CM | POA: Insufficient documentation

## 2013-03-11 DIAGNOSIS — Z9889 Other specified postprocedural states: Secondary | ICD-10-CM | POA: Insufficient documentation

## 2013-03-11 DIAGNOSIS — Z862 Personal history of diseases of the blood and blood-forming organs and certain disorders involving the immune mechanism: Secondary | ICD-10-CM | POA: Insufficient documentation

## 2013-03-11 DIAGNOSIS — F411 Generalized anxiety disorder: Secondary | ICD-10-CM | POA: Insufficient documentation

## 2013-03-11 DIAGNOSIS — Z79899 Other long term (current) drug therapy: Secondary | ICD-10-CM | POA: Insufficient documentation

## 2013-03-11 MED ORDER — PROMETHAZINE HCL 25 MG/ML IJ SOLN
25.0000 mg | Freq: Once | INTRAMUSCULAR | Status: AC
  Administered 2013-03-11: 25 mg via INTRAVENOUS
  Filled 2013-03-11: qty 1

## 2013-03-11 MED ORDER — HYDROMORPHONE HCL PF 1 MG/ML IJ SOLN
1.0000 mg | Freq: Once | INTRAMUSCULAR | Status: AC
  Administered 2013-03-11: 0.5 mg via INTRAVENOUS
  Filled 2013-03-11: qty 1

## 2013-03-11 MED ORDER — KETOROLAC TROMETHAMINE 30 MG/ML IJ SOLN
30.0000 mg | Freq: Once | INTRAMUSCULAR | Status: AC
  Administered 2013-03-11: 30 mg via INTRAVENOUS
  Filled 2013-03-11: qty 1

## 2013-03-11 MED ORDER — SODIUM CHLORIDE 0.9 % IV BOLUS (SEPSIS)
1000.0000 mL | Freq: Once | INTRAVENOUS | Status: AC
  Administered 2013-03-11: 1000 mL via INTRAVENOUS

## 2013-03-11 MED ORDER — HALOPERIDOL LACTATE 5 MG/ML IJ SOLN
2.5000 mg | Freq: Once | INTRAMUSCULAR | Status: AC
  Administered 2013-03-11: 2.5 mg via INTRAVENOUS
  Filled 2013-03-11: qty 1

## 2013-03-11 MED ORDER — DIPHENHYDRAMINE HCL 50 MG/ML IJ SOLN
25.0000 mg | Freq: Once | INTRAMUSCULAR | Status: AC
  Administered 2013-03-11: 25 mg via INTRAVENOUS
  Filled 2013-03-11: qty 1

## 2013-03-11 NOTE — ED Notes (Signed)
Pt c/o feeling agitated after the Haldol administration.  Care provider notified.  Additional order received.

## 2013-03-11 NOTE — ED Notes (Signed)
MD at bedside. 

## 2013-03-11 NOTE — ED Provider Notes (Signed)
CSN: [id number]     Arrival date & time 03/11/13  1712 History   First MD Initiated Contact with Patient 03/11/13 1901     Chief Complaint  Patient presents with  . Headache   (Consider location/radiation/quality/duration/timing/severity/associated sxs/prior Treatment) Patient is a 52 y.o. female presenting with headaches. The history is provided by the patient. No language interpreter was used.  Headache Pain location:  R parietal Quality:  Sharp Radiates to:  Does not radiate Severity currently:  9/10 Onset quality:  Gradual Duration:  2 days Timing:  Constant Progression:  Worsening Similar to prior headaches: yes   Associated symptoms: nausea and photophobia   Associated symptoms: no abdominal pain, no blurred vision, no congestion, no diarrhea, no dizziness, no fatigue, no fever, no neck stiffness, no numbness, no paresthesias, no visual change, no vomiting and no weakness   Nausea:    Severity:  Moderate   Onset quality:  Gradual   Duration:  1 day  Pt is a 52 year old female who presents with a history of a headache which she says is typical of one of her migraines. She has had a gradual onset of pain for the last two days. She reports that she had a colonoscopy on Friday and has had a migraine following. She reports that she got propofol for sedation and has had migraines after receiving this medication before. She reports that this is typical of migraines and she denies any associated aura. She reports nausea, photophobia and right occipital pain and pain occuring behind her right eye. She denies any numbness, weakness, paresthesia or confusion. No visual changes. Denies any history of fever, chills, neck stiffness or recent illness. No gait disturbances or focal weakness.    Past Medical History  Diagnosis Date  . Anemia   . Depression   . Eating disorder     anorexia nervosa in her 20's  . Fainting spell   . Kidney stones     passed on their own  . Hx of laminectomy  1994    L4-5  . Stomach disorder     due to complications with cholecystectomy "almost died"  . Anxiety   . Migraine    Past Surgical History  Procedure Laterality Date  . Cholecystectomy  08/2009    reports history of gallbladder polyps, she reports stents in duct of lushca   . Appendectomy  1976  . Tonsillectomy  1982  . Laminectomy  1994    L4-5  . Tubal ligation    . Hysteroscopy  07/2011   Family History  Problem Relation Age of Onset  . Hypertension Mother   . Diabetes Father   . Hypertension Father   . Alcohol abuse Maternal Grandmother   . Cancer Paternal Grandfather     lung cancer  . Hashimoto's thyroiditis Sister    History  Substance Use Topics  . Smoking status: Never Smoker   . Smokeless tobacco: Never Used  . Alcohol Use: No   OB History   Grav Para Term Preterm Abortions TAB SAB Ect Mult Living                 Review of Systems  Constitutional: Negative for fever and fatigue.  HENT: Negative for congestion.   Eyes: Positive for photophobia. Negative for blurred vision.  Gastrointestinal: Positive for nausea. Negative for vomiting, abdominal pain and diarrhea.  Musculoskeletal: Negative for neck stiffness.  Neurological: Positive for headaches. Negative for dizziness, numbness and paresthesias.    Allergies  Eszopiclone; Flexeril; Gabapentin; Morphine and related; Nsaids; Paroxetine; Shellfish allergy; and Tizanidine hcl  Home Medications   Current Outpatient Rx  Name  Route  Sig  Dispense  Refill  . busPIRone (BUSPAR) 15 MG tablet   Oral   Take 30 mg by mouth 2 (two) times daily.         . clonazePAM (KLONOPIN) 0.5 MG tablet   Oral   Take 1 tablet (0.5 mg total) by mouth every 8 (eight) hours. For anxiety.   90 tablet   0   . Lurasidone HCl (LATUDA) 60 MG TABS   Oral   Take 1 tablet by mouth every evening.         Marland Kitchen PARoxetine (PAXIL) 40 MG tablet   Oral   Take 1 tablet (40 mg total) by mouth daily. For anxiety and  depression.   30 tablet   0   . polyethylene glycol powder (GLYCOLAX/MIRALAX) powder   Oral   Take 17 g by mouth 2 (two) times daily. Until daily soft stools  OTC for constipation and hard stools.   255 g   0   . QUEtiapine (SEROQUEL) 400 MG tablet   Oral   Take 1 tablet (400 mg total) by mouth at bedtime. For depression and insomnia.   30 tablet   0   . SUMAtriptan (IMITREX) 100 MG tablet   Oral   Take 100 mg by mouth every 2 (two) hours as needed for migraine or headache. May repeat in 2 hours if headache persists or recurs.         . traZODone (DESYREL) 50 MG tablet      Take one tablet at bedtime if needed for insomnia.   30 tablet   0   . zonisamide (ZONEGRAN) 100 MG capsule   Oral   Take 200 mg by mouth 2 (two) times daily.          BP 106/72  Temp(Src) 97.5 F (36.4 C) (Oral)  Resp 16  Ht 5\' 6"  (1.676 m)  Wt 140 lb (63.504 kg)  BMI 22.61 kg/m2  SpO2 98%  LMP 03/03/2013 Physical Exam  Nursing note and vitals reviewed. Constitutional: She is oriented to person, place, and time. She appears well-developed and well-nourished. No distress.  HENT:  Head: Normocephalic and atraumatic.  Mouth/Throat: Oropharynx is clear and moist.  Eyes: Conjunctivae and EOM are normal. Pupils are equal, round, and reactive to light.  Neck: Normal range of motion. Neck supple. No JVD present. No tracheal deviation present. No thyromegaly present.  Cardiovascular: Normal rate, regular rhythm and normal heart sounds.   Pulmonary/Chest: Effort normal and breath sounds normal. No respiratory distress. She has no wheezes.  Abdominal: Soft. Bowel sounds are normal. She exhibits no distension. There is no tenderness.  Musculoskeletal: Normal range of motion.  Lymphadenopathy:    She has no cervical adenopathy.  Neurological: She is alert and oriented to person, place, and time. She has normal strength. No cranial nerve deficit or sensory deficit. Coordination normal. GCS eye  subscore is 4. GCS verbal subscore is 5. GCS motor subscore is 6.  No focal weakness, numbness, tingling or paresthesia.  Skin: Skin is warm and dry. No rash noted.  Psychiatric: She has a normal mood and affect. Her behavior is normal. Judgment and thought content normal.    ED Course  Procedures (including critical care time) Labs Review Labs Reviewed - No data to display Imaging Review No results found.  EKG Interpretation  None       MDM   1. Migraine    Pt reports that this is consistent with her typical migraines. She does not exhibit any meningeal signs, fever, confusion or focal weakness or deficits. Neuro exam reassuring. She reports that she had a colonoscopy on Friday and had propofol for sedation and she has migraines after receiving this medication in the past. Pt feeling better after fluids and meds here in ER and wants to go home to sleep it off. Pt stable to go home. Discharged with husband and agreed to follow-up if symptoms worsen.      Irish Elders, NP 03/12/13 [phone]

## 2013-03-11 NOTE — ED Notes (Addendum)
Persistent migraine headache after receiving propofol after a colonoscopy on Friday.  States nauseated, pain behind right eye.  Seen here yesterday with some relief, but headache reoccurred this morning.

## 2013-03-11 NOTE — ED Notes (Signed)
NP at bedside.

## 2013-03-11 NOTE — ED Notes (Addendum)
Pt only given Dilaudid 0.5mg  d/t appearance of being adequately medicated.  She rolled over on her side and went to sleep during administration so I only administered half of the ordered dose as good nursing judgement d/t multitude of other drugs already given during this visit.

## 2013-03-14 NOTE — ED Provider Notes (Signed)
Medical screening examination/treatment/procedure(s) were performed by non-physician practitioner and as supervising physician I was immediately available for consultation/collaboration.  EKG Interpretation   None         Daymian Lill W. Brandin Stetzer, MD 03/14/13 0634 

## 2013-03-16 ENCOUNTER — Ambulatory Visit: Payer: Self-pay | Admitting: Family

## 2013-03-23 ENCOUNTER — Encounter: Payer: Self-pay | Admitting: Family

## 2013-03-23 ENCOUNTER — Ambulatory Visit (INDEPENDENT_AMBULATORY_CARE_PROVIDER_SITE_OTHER): Payer: BC Managed Care – PPO | Admitting: Family

## 2013-03-23 VITALS — BP 96/72 | HR 54 | Temp 97.8°F | Resp 16 | Ht 65.75 in | Wt 148.0 lb

## 2013-03-23 DIAGNOSIS — F32A Depression, unspecified: Secondary | ICD-10-CM

## 2013-03-23 DIAGNOSIS — F329 Major depressive disorder, single episode, unspecified: Secondary | ICD-10-CM

## 2013-03-23 DIAGNOSIS — R413 Other amnesia: Secondary | ICD-10-CM

## 2013-03-23 DIAGNOSIS — R209 Unspecified disturbances of skin sensation: Secondary | ICD-10-CM

## 2013-03-23 DIAGNOSIS — I7300 Raynaud's syndrome without gangrene: Secondary | ICD-10-CM

## 2013-03-23 DIAGNOSIS — F341 Dysthymic disorder: Secondary | ICD-10-CM

## 2013-03-23 DIAGNOSIS — R0602 Shortness of breath: Secondary | ICD-10-CM

## 2013-03-23 DIAGNOSIS — R202 Paresthesia of skin: Secondary | ICD-10-CM | POA: Insufficient documentation

## 2013-03-23 DIAGNOSIS — R200 Anesthesia of skin: Secondary | ICD-10-CM

## 2013-03-23 HISTORY — DX: Raynaud's syndrome without gangrene: I73.00

## 2013-03-23 LAB — SEDIMENTATION RATE: Sed Rate: 1 mm/hr (ref 0–22)

## 2013-03-23 NOTE — Assessment & Plan Note (Signed)
Pt swims 1 mile a day without difficulty.  Stress test and PFT's normal. Reassurance provided. Again, I think anxiety is primary issue here and we discussed trying to reduce stress.

## 2013-03-23 NOTE — Progress Notes (Signed)
Subjective:    Patient ID: Shannon [name], female    DOB: [birth year], 52 y.o.   MRN: [id number]  HPI  Shannon [name] is a 52 yr old female who presents today with multiple concerns:  Anxiety-Pt reports high stress and anxiety. Only sleeping 2-3 hour increments. Stopped clonazepam >1 month ago. Stopped 1 month ago. [name]- [facility].  He placed her on clonidine instead.  Son is living at home.  Son has a lot of anxiety and anger.  Also reports some difficulty swallowing.  Continues to get regular periods.    Circulatory Problem Pt notes no feeling in her toes. Toes and fingers will go numb and turn purple. Palms and feet feel like needles are sticking them all the time. Reports easy bruisibility.    Memory Loss Notes short term memory problems. Went to [employer] but doesn't remember any of the drive down there. Has been told that she repeats words frequently. Had normal b12 and folate level in October.  Has upcoming apt with Dr. Everlena Cooper- neuro for back/neck pain.   Shortness of Breath Pt notes shortness of breath and tired feeling during housework. Doesn't get short of breath when swimming. Had spirometry last year which was normal. Had normal nuclear stress test in October 2013.   Continues to have migraine over the right eye.  Has been to the ED twice.    Review of Systems See HPI  Past Medical History  Diagnosis Date  . Anemia   . Depression   . Eating disorder     anorexia nervosa in her 20's  . Fainting spell   . Kidney stones     passed on their own  . Hx of laminectomy 1994    L4-5  . Stomach disorder     due to complications with cholecystectomy "almost died"  . Anxiety   . Migraine     History   Social History  . Marital Status: Married    Spouse Name: N/A    Number of Children: 3  . Years of Education: N/A   Occupational History  .     Social History Main Topics  . Smoking status: Never Smoker   . Smokeless tobacco: Never Used  . Alcohol  Use: No  . Drug Use: No  . Sexual Activity: Yes    Birth Control/ Protection: Surgical   Other Topics Concern  . Not on file   Social History Narrative   Regular exercise:  Yes (swim instructor)   Caffeine Use:  1 daily   Married 3 children ages 105 son, 16 son, and 64 daughter   Associates degree                Past Surgical History  Procedure Laterality Date  . Cholecystectomy  08/2009    reports history of gallbladder polyps, she reports stents in duct of lushca   . Appendectomy  1976  . Tonsillectomy  1982  . Laminectomy  1994    L4-5  . Tubal ligation    . Hysteroscopy  07/2011    Family History  Problem Relation Age of Onset  . Hypertension Mother   . Diabetes Father   . Hypertension Father   . Alcohol abuse Maternal Grandmother   . Cancer Paternal Grandfather     lung cancer  . Hashimoto's thyroiditis Sister     Allergies  Allergen Reactions  . Eszopiclone     Headaches  . Flexeril [Cyclobenzaprine Hcl] Other (See Comments)  Mood swings  . Gabapentin     sedation  . Morphine And Related Other (See Comments)    No relief  . Nsaids Other (See Comments)    GI bleed  . Paroxetine Nausea Only       . Shellfish Allergy Other (See Comments)    skin & mouth tingle  . Tizanidine Hcl Other (See Comments)    Dizziness, mood swings    Current Outpatient Prescriptions on File Prior to Visit  Medication Sig Dispense Refill  . busPIRone (BUSPAR) 15 MG tablet Take 30 mg by mouth 2 (two) times daily.      Marland Kitchen PARoxetine (PAXIL) 40 MG tablet Take 1 tablet (40 mg total) by mouth daily. For anxiety and depression.  30 tablet  0  . SUMAtriptan (IMITREX) 100 MG tablet Take 100 mg by mouth every 2 (two) hours as needed for migraine or headache. May repeat in 2 hours if headache persists or recurs.      . clonazePAM (KLONOPIN) 0.5 MG tablet Take 1 tablet (0.5 mg total) by mouth every 8 (eight) hours. For anxiety.  90 tablet  0   No current facility-administered  medications on file prior to visit.    BP 96/72  Pulse 54  Temp(Src) 97.8 F (36.6 C) (Oral)  Resp 16  Ht 5' 5.75" (1.67 m)  Wt 148 lb (67.132 kg)  BMI 24.07 kg/m2  SpO2 99%  LMP 03/03/2013       Objective:   Physical Exam  Constitutional: She is oriented to person, place, and time. She appears well-developed and well-nourished. No distress.  HENT:  Head: Normocephalic and atraumatic.  Cardiovascular: Normal rate and regular rhythm.   No murmur heard. Pulmonary/Chest: Effort normal and breath sounds normal. No respiratory distress. She has no wheezes. She has no rales. She exhibits no tenderness.  Musculoskeletal: She exhibits no edema.  Lymphadenopathy:    She has no cervical adenopathy.  Neurological: She is alert and oriented to person, place, and time.  Skin: Skin is warm and dry.  Psychiatric: She has a normal mood and affect. Her behavior is normal. Judgment and thought content normal.          Assessment & Plan:

## 2013-03-23 NOTE — Assessment & Plan Note (Signed)
Could be related to neck/back issues.  I have asked Dr.Jaffee to also evaluate migraine, paresthesia, memory loss at her upcoming consulation.

## 2013-03-23 NOTE — Patient Instructions (Signed)
Please keep your upcoming appointment with Dr. Everlena Cooper. Complete lab work prior to leaving. Follow up in 3 months.

## 2013-03-23 NOTE — Assessment & Plan Note (Signed)
Anxiety has worsened.  Anxiety is managed by psychiatry and they are trying to keep her off of benzo's.  Defer management to psych.

## 2013-03-23 NOTE — Progress Notes (Signed)
Pre visit review using our clinic review tool, if applicable. No additional management support is needed unless otherwise documented below in the visit note. 

## 2013-03-23 NOTE — Assessment & Plan Note (Addendum)
Symptoms most consistent with raynauds which is exacerbated by spending long periods of time in a cool swimming pool.  Obtain ESR, RA, ANA to exclude vasculitis. Uptodate pt hand out provided re: Raynauds.

## 2013-03-23 NOTE — Assessment & Plan Note (Signed)
I think that this is exacerbated by her anxiety. A MMSE is performed today and pt's score was very good- 29/30. B12 and folate levels are normal.

## 2013-03-26 ENCOUNTER — Encounter: Payer: Self-pay | Admitting: Family

## 2013-03-26 ENCOUNTER — Emergency Department (HOSPITAL_BASED_OUTPATIENT_CLINIC_OR_DEPARTMENT_OTHER)
Admission: EM | Admit: 2013-03-26 | Discharge: 2013-03-26 | Disposition: A | Discharge status: Home / Self Care | Payer: BC Managed Care – PPO | Attending: Emergency Medicine | Admitting: Emergency Medicine

## 2013-03-26 ENCOUNTER — Telehealth: Payer: Self-pay | Admitting: *Deleted

## 2013-03-26 ENCOUNTER — Emergency Department (HOSPITAL_BASED_OUTPATIENT_CLINIC_OR_DEPARTMENT_OTHER): Payer: BC Managed Care – PPO

## 2013-03-26 DIAGNOSIS — R5381 Other malaise: Secondary | ICD-10-CM | POA: Insufficient documentation

## 2013-03-26 DIAGNOSIS — R0789 Other chest pain: Secondary | ICD-10-CM | POA: Insufficient documentation

## 2013-03-26 DIAGNOSIS — F410 Panic disorder [episodic paroxysmal anxiety] without agoraphobia: Secondary | ICD-10-CM | POA: Insufficient documentation

## 2013-03-26 DIAGNOSIS — G43709 Chronic migraine without aura, not intractable, without status migrainosus: Secondary | ICD-10-CM | POA: Insufficient documentation

## 2013-03-26 DIAGNOSIS — R0602 Shortness of breath: Secondary | ICD-10-CM | POA: Insufficient documentation

## 2013-03-26 DIAGNOSIS — R110 Nausea: Secondary | ICD-10-CM | POA: Insufficient documentation

## 2013-03-26 DIAGNOSIS — Z9889 Other specified postprocedural states: Secondary | ICD-10-CM | POA: Insufficient documentation

## 2013-03-26 DIAGNOSIS — Z862 Personal history of diseases of the blood and blood-forming organs and certain disorders involving the immune mechanism: Secondary | ICD-10-CM | POA: Insufficient documentation

## 2013-03-26 DIAGNOSIS — F419 Anxiety disorder, unspecified: Secondary | ICD-10-CM

## 2013-03-26 DIAGNOSIS — Z87442 Personal history of urinary calculi: Secondary | ICD-10-CM | POA: Insufficient documentation

## 2013-03-26 DIAGNOSIS — R55 Syncope and collapse: Secondary | ICD-10-CM

## 2013-03-26 DIAGNOSIS — F3289 Other specified depressive episodes: Secondary | ICD-10-CM | POA: Insufficient documentation

## 2013-03-26 DIAGNOSIS — Z79899 Other long term (current) drug therapy: Secondary | ICD-10-CM | POA: Insufficient documentation

## 2013-03-26 DIAGNOSIS — R209 Unspecified disturbances of skin sensation: Secondary | ICD-10-CM | POA: Insufficient documentation

## 2013-03-26 DIAGNOSIS — F329 Major depressive disorder, single episode, unspecified: Secondary | ICD-10-CM | POA: Insufficient documentation

## 2013-03-26 LAB — CBC WITH DIFFERENTIAL/PLATELET
Basophils Relative: 0 % (ref 0–1)
Hemoglobin: 13.8 g/dL (ref 12.0–15.0)
Lymphs Abs: 1.6 10*3/uL (ref 0.7–4.0)
MCH: 31.2 pg (ref 26.0–34.0)
MCHC: 33.4 g/dL (ref 30.0–36.0)
Monocytes Relative: 9 % (ref 3–12)
Neutro Abs: 5.7 10*3/uL (ref 1.7–7.7)
Neutrophils Relative %: 70 % (ref 43–77)
Platelets: 254 10*3/uL (ref 150–400)
RBC: 4.42 MIL/uL (ref 3.87–5.11)
RDW: 11.9 % (ref 11.5–15.5)

## 2013-03-26 LAB — BASIC METABOLIC PANEL
BUN: 7 mg/dL (ref 6–23)
Chloride: 104 mEq/L (ref 96–112)
GFR calc Af Amer: >90 mL/min (ref >90)
GFR calc non Af Amer: >90 mL/min (ref >90)
Potassium: 3.6 mEq/L (ref 3.5–5.1)
Sodium: 141 mEq/L (ref 135–145)

## 2013-03-26 LAB — TROPONIN I: Troponin I: <0.3 ng/mL (ref <0.30)

## 2013-03-26 MED ORDER — SODIUM CHLORIDE 0.9 % IV BOLUS (SEPSIS)
1000.0000 mL | Freq: Once | INTRAVENOUS | Status: AC
  Administered 2013-03-26: 1000 mL via INTRAVENOUS

## 2013-03-26 MED ORDER — METOCLOPRAMIDE HCL 5 MG/ML IJ SOLN
10.0000 mg | Freq: Once | INTRAMUSCULAR | Status: AC
  Administered 2013-03-26: 10 mg via INTRAVENOUS
  Filled 2013-03-26: qty 2

## 2013-03-26 MED ORDER — DEXAMETHASONE SODIUM PHOSPHATE 10 MG/ML IJ SOLN
10.0000 mg | Freq: Once | INTRAMUSCULAR | Status: AC
  Administered 2013-03-26: 10 mg via INTRAVENOUS
  Filled 2013-03-26: qty 1

## 2013-03-26 MED ORDER — DIPHENHYDRAMINE HCL 50 MG/ML IJ SOLN
25.0000 mg | Freq: Once | INTRAMUSCULAR | Status: AC
  Administered 2013-03-26: 25 mg via INTRAVENOUS
  Filled 2013-03-26: qty 1

## 2013-03-26 MED ORDER — ONDANSETRON HCL 4 MG/2ML IJ SOLN
4.0000 mg | Freq: Once | INTRAMUSCULAR | Status: AC
  Administered 2013-03-26: 4 mg via INTRAVENOUS
  Filled 2013-03-26: qty 2

## 2013-03-26 NOTE — ED Notes (Signed)
Patient changed into gown, waist up. 

## 2013-03-26 NOTE — ED Notes (Signed)
Quick triage assessment of neuro symptoms are negative for stroke.  Pt. Reports she is coming off klonipin and poss. Having withdrawals.  Pt. Reports she just does not feel well.

## 2013-03-26 NOTE — Telephone Encounter (Signed)
Received call from pt requesting refill of lurasidone until she can get in with her specialist.  Would like refill to [facility]. Please call when complete.  Please advise.

## 2013-03-26 NOTE — ED Provider Notes (Signed)
CSN: [id number]     Arrival date & time 03/26/13  1502 History  This chart was scribed for Shannon Churn, MD by Leone Payor, ED Scribe. This patient was seen in room MH10/MH10 and the patient's care was started 3:28 PM.    Chief Complaint  Patient presents with  . Dizziness    The history is provided by the patient. No language interpreter was used.    HPI Comments: Shannon [name] is a 52 y.o. female who presents to the Emergency Department complaining of constant, worsened dizziness and lightheadedness that began 4 days ago. Pt states she has been feeling shaky and tired for the same time. Pt states she was standing at the prescription counter here when she had an episode of syncope. She states she remembers being at the counter and then fell on her knees. Pt states she felt the episode begin and told the person at the counter she was going to faint. She states she felt very weak and couldn't stand up. She believes she had LOC for a brief period of time and woke up on the couch with people around her. She denies falling or injuring herself during the event.  Per pt, she quit taking her Klonopin cold [place] and was started on Clonidine soon after. Pt states she has a history of anxiety and panic attacks and states these symptoms were somewhat similar. She states this time she also had associated non-radiating, chest pressure with SOB and tingling sensations in her arms and fingers. She also reports having some mild nausea and a HA over the right eye. Pt states that is typical of her chronic migraines. She denies new abdominal pain.   Past Medical History  Diagnosis Date  . Anemia   . Depression   . Eating disorder     anorexia nervosa in her 20's  . Fainting spell   . Kidney stones     passed on their own  . Hx of laminectomy 1994    L4-5  . Stomach disorder     due to complications with cholecystectomy "almost died"  . Anxiety   . Migraine    Past Surgical History  Procedure  Laterality Date  . Cholecystectomy  08/2009    reports history of gallbladder polyps, she reports stents in duct of lushca   . Appendectomy  1976  . Tonsillectomy  1982  . Laminectomy  1994    L4-5  . Tubal ligation    . Hysteroscopy  07/2011   Family History  Problem Relation Age of Onset  . Hypertension Mother   . Diabetes Father   . Hypertension Father   . Alcohol abuse Maternal Grandmother   . Cancer Paternal Grandfather     lung cancer  . Hashimoto's thyroiditis Sister    History  Substance Use Topics  . Smoking status: Never Smoker   . Smokeless tobacco: Never Used  . Alcohol Use: No   OB History   Grav Para Term Preterm Abortions TAB SAB Ect Mult Living                 Review of Systems  Respiratory: Positive for shortness of breath.   Cardiovascular: Positive for chest pain.  Gastrointestinal: Positive for nausea. Negative for abdominal pain.  Neurological: Positive for dizziness, syncope, weakness, light-headedness, numbness and headaches.  All other systems reviewed and are negative.    Allergies  Eszopiclone; Flexeril; Gabapentin; Morphine and related; Nsaids; Paroxetine; Shellfish allergy; and Tizanidine hcl  Home Medications  Current Outpatient Rx  Name  Route  Sig  Dispense  Refill  . clonazePAM (KLONOPIN) 0.5 MG tablet   Oral   Take 1 tablet (0.5 mg total) by mouth every 8 (eight) hours. For anxiety.   90 tablet   0   . cloNIDine (CATAPRES) 0.1 MG tablet   Oral   Take 0.1 mg by mouth 2 (two) times daily.         Marland Kitchen zonisamide (ZONEGRAN) 50 MG capsule   Oral   Take 50 mg by mouth daily.         . busPIRone (BUSPAR) 15 MG tablet   Oral   Take 30 mg by mouth 2 (two) times daily.         . Linaclotide (LINZESS PO)   Oral   Take 1 capsule by mouth daily.         Marland Kitchen lurasidone (LATUDA) 80 MG TABS tablet   Oral   Take 80 mg by mouth daily with breakfast.         . PARoxetine (PAXIL) 40 MG tablet   Oral   Take 1 tablet (40 mg  total) by mouth daily. For anxiety and depression.   30 tablet   0   . SUMAtriptan (IMITREX) 100 MG tablet   Oral   Take 100 mg by mouth every 2 (two) hours as needed for migraine or headache. May repeat in 2 hours if headache persists or recurs.          BP 107/72  Pulse 68  Temp(Src) 98.6 F (37 C) (Oral)  Resp 18  Ht 5\' 6"  (1.676 m)  Wt 140 lb (63.504 kg)  BMI 22.61 kg/m2  SpO2 100%  LMP 03/03/2013 Physical Exam  Nursing note and vitals reviewed. Constitutional: She is oriented to person, place, and time. She appears well-developed and well-nourished. No distress.  HENT:  Head: Normocephalic and atraumatic.  Mouth/Throat: Oropharynx is clear and moist.  Eyes: Conjunctivae are normal. Pupils are equal, round, and reactive to light. No scleral icterus.  Neck: Neck supple.  Cardiovascular: Normal rate, regular rhythm, normal heart sounds and intact distal pulses.   No murmur heard. Pulmonary/Chest: Effort normal and breath sounds normal. No stridor. No respiratory distress. She has no rales.  Abdominal: Soft. Bowel sounds are normal. She exhibits no distension. There is no tenderness.  Musculoskeletal: Normal range of motion.  Neurological: She is alert and oriented to person, place, and time. She has normal strength. No cranial nerve deficit or sensory deficit. Coordination and gait normal. GCS eye subscore is 4. GCS verbal subscore is 5. GCS motor subscore is 6.  Reflex Scores:      Patellar reflexes are 2+ on the right side and 2+ on the left side. Skin: Skin is warm and dry. No rash noted.  Psychiatric: She has a normal mood and affect. Her behavior is normal.    ED Course  Procedures  DIAGNOSTIC STUDIES: Oxygen Saturation is 100% on RA, normal by my interpretation.    COORDINATION OF CARE: 3:37 PM Will order CXR and CBC, BMP, and troponin. Discussed treatment plan with pt at bedside and pt agreed to plan.  Medications  sodium chloride 0.9 % bolus 1,000 mL  (1,000 mLs Intravenous New Bag/Given 03/26/13 1600)  metoCLOPramide (REGLAN) injection 10 mg (10 mg Intravenous Given 03/26/13 1631)  dexamethasone (DECADRON) injection 10 mg (10 mg Intravenous Given 03/26/13 1631)  diphenhydrAMINE (BENADRYL) injection 25 mg (25 mg Intravenous Given 03/26/13 1631)  Labs Review Labs Reviewed  BASIC METABOLIC PANEL - Abnormal; Notable for the following:    Glucose, Bld 125 (*)    All other components within normal limits  CBC WITH DIFFERENTIAL  TROPONIN I  TROPONIN I   Imaging Review Dg Chest Port 1 View  03/26/2013   CLINICAL DATA:  Chest pain  EXAM: PORTABLE CHEST - 1 VIEW  COMPARISON:  03/22/2012  FINDINGS: The heart size and mediastinal contours are within normal limits. Both lungs are clear. The visualized skeletal structures are unremarkable.  IMPRESSION: No active disease.   Electronically Signed   By: Alcide Clever M.D.   On: 03/26/2013 16:15    EKG Interpretation    Date/Time:  Monday March 26 2013 15:20:47 EST Ventricular Rate:  56 PR Interval:  150 QRS Duration: 78 QT Interval:  400 QTC Calculation: 386 R Axis:   81 Text Interpretation:  Sinus bradycardia Possible Left atrial enlargement Nonspecific T wave changes, similar to prior. Confirmed by [facility]  MD, TREY ([id number]) on 03/26/2013 3:28:41 PM            MDM   1. Syncope   2. Anxiety    Pt has multiple complaints, including syncope, panic attack, chest pain, headache.  Symptoms are all possibly secondary to anxiety and recent medication changes (stopped clonazepam, started clonidine.)  Her syncope does not sound consistent with cardiogenic syncope.  Her chest pain is likely related to her anxiety. Her headache is consistent with multiple prior headaches and she has a normal neurologic exam.  Plan IV metoclopramide, diphenhydramine, dexamethasone, and fluids for symptoms.      Her lab tests are unremarkable.  Her headache dramatically improved.  She is tapering off  clonidine over next few days.  She will follow up with her primary doctor.    I personally performed the services described in this documentation, which was scribed in my presence. The recorded information has been reviewed and is accurate.     Shannon Churn, MD 03/26/13 [phone]

## 2013-03-26 NOTE — ED Notes (Signed)
Pt. Reports she takes zonisimide for migranes  Daily and has not had any since Friday.

## 2013-03-27 MED ORDER — ZONISAMIDE 50 MG PO CAPS: 50.0000 mg | ORAL_CAPSULE | Freq: Every day | ORAL | Status: DC

## 2013-03-27 NOTE — Telephone Encounter (Addendum)
Has she been in to see [name] since she was evaluated in [facility] in August? If so, he should refill.  If not, we can provide a 1 month supply. Further refills per Psych.  Also, I see she was in ED.  Should be seen for ED follow up in 1-2 weeks.

## 2013-03-27 NOTE — Telephone Encounter (Signed)
Notified pt of below recommendations. She states she will schedule appt when she picks up her son's work note today.  Pt states she was requesting refill of zonegran, not Latuda. Pt is scheduled to see new neurologist on Monday. Pt states she ran out of zonegran on Friday, still has really bad headache. Please advise.

## 2013-03-27 NOTE — Telephone Encounter (Signed)
OK to send 1 month supply of zonegran.

## 2013-03-27 NOTE — Telephone Encounter (Signed)
Rx sent, notified pt. 

## 2013-04-02 ENCOUNTER — Ambulatory Visit (INDEPENDENT_AMBULATORY_CARE_PROVIDER_SITE_OTHER): Payer: BC Managed Care – PPO | Admitting: Neurology

## 2013-04-02 ENCOUNTER — Encounter: Payer: Self-pay | Admitting: Neurology

## 2013-04-02 VITALS — BP 118/78 | HR 88 | Temp 98.1°F | Ht 66.0 in | Wt 146.0 lb

## 2013-04-02 DIAGNOSIS — M542 Cervicalgia: Secondary | ICD-10-CM

## 2013-04-02 DIAGNOSIS — G8929 Other chronic pain: Secondary | ICD-10-CM

## 2013-04-02 DIAGNOSIS — Z8669 Personal history of other diseases of the nervous system and sense organs: Secondary | ICD-10-CM

## 2013-04-02 DIAGNOSIS — M549 Dorsalgia, unspecified: Secondary | ICD-10-CM

## 2013-04-02 MED ORDER — SUMATRIPTAN SUCCINATE 100 MG PO TABS: 100.0000 mg | ORAL_TABLET | ORAL | Status: DC | PRN

## 2013-04-02 MED ORDER — NORTRIPTYLINE HCL 10 MG PO CAPS: 10.0000 mg | ORAL_CAPSULE | Freq: Every day | ORAL | Status: DC
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
## 2013-04-02 NOTE — Patient Instructions (Signed)
1.  Start nortriptyline 10mg  at bedtime.  Side effects include sleepiness, weight gain, dizziness.  Call in one month with update and we can adjust medication at that time.  If having side effects, call sooner.  This can help with the migraine as well as neck and back pain. 2.  Take the imitrex but take first dose at immediate onset of migraine. 3.  Do not take any pain medications for migraine more than 2 days out of the week to prevent medication overuse headache. 4.  Physical therapy for neck. 5.  Follow up in 3 months.

## 2013-04-03 ENCOUNTER — Other Ambulatory Visit: Payer: Self-pay | Admitting: Obstetrics and Gynecology

## 2013-04-23 ENCOUNTER — Other Ambulatory Visit: Payer: Self-pay | Admitting: Neurology

## 2013-04-23 ENCOUNTER — Telehealth: Payer: Self-pay | Admitting: Neurology

## 2013-04-23 MED ORDER — NORTRIPTYLINE HCL 25 MG PO CAPS: 25.0000 mg | ORAL_CAPSULE | Freq: Every day | ORAL | Status: DC

## 2013-04-23 NOTE — Telephone Encounter (Signed)
Done

## 2013-04-23 NOTE — Telephone Encounter (Signed)
Patient left a voicemail message asking for a refill on her Nortriptyline 25 mg. She said that Dr. Everlena Cooper said she could increase it from 10 to 25mg .  She asked that it go to the [facility] at phone number [phone].  **Dr. Everlena Cooper, you prescribed the 10 mg. I did not see where you asked her to increase if needed. She aslo chose to do acupuncture of the recommended PT for her neck pain. Please advise med refill request.

## 2013-04-23 NOTE — Telephone Encounter (Signed)
It's okay to refill nortriptyline as 25mg  tablets, 1 tablet at bedtime.  #30 with 3 refills.

## 2013-05-04 ENCOUNTER — Telehealth: Payer: Self-pay | Admitting: Neurology
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
## 2013-05-04 NOTE — Telephone Encounter (Signed)
Patient left a vm message stating she was having

## 2013-05-04 NOTE — Telephone Encounter (Signed)
I don't prescribe Soma.  I would be happy prescribing cyclobenzaprine 5mg , 1-2 tabs TID (#60, R0)

## 2013-05-07 ENCOUNTER — Other Ambulatory Visit: Payer: Self-pay | Admitting: Neurology

## 2013-05-07 DIAGNOSIS — M62838 Other muscle spasm: Secondary | ICD-10-CM

## 2013-05-07 MED ORDER — BACLOFEN 10 MG PO TABS: 5.0000 mg | ORAL_TABLET | Freq: Three times a day (TID) | ORAL | Status: DC | PRN

## 2013-05-07 MED ORDER — BACLOFEN 5 MG HALF TABLET: 5.0000 mg | ORAL_TABLET | Freq: Three times a day (TID) | ORAL | Status: DC | PRN

## 2013-05-07 NOTE — Telephone Encounter (Signed)
Then baclofen 5mg  TID PRN, #45, no refills.  If she cannot take this, then I recommend asking her PCP for refills of the Soma.

## 2013-05-07 NOTE — Telephone Encounter (Signed)
Spoke with the patient. Info given as per Dr. Tomi Likens below but the patient reports that she is unable to take Flexeril as it causes "mood swings" (see allergy list). I told her that I would check back with Dr. Tomi Likens and get his recommendation. She is ok to wait. Patient uses the [facility] on [address]. **Dr. Tomi Likens, please advise. Thanks.

## 2013-05-07 NOTE — Telephone Encounter (Signed)
Then prescribe tizanidine 2mg  caps, 1 cap every 8 hours PRN for muscle spasms.  #30.  No refills.

## 2013-05-07 NOTE — Telephone Encounter (Signed)
Tizanidine is also on her allergy list. **Please advise.

## 2013-05-07 NOTE — Telephone Encounter (Signed)
Left a message for the patient to return my call.  

## 2013-05-07 NOTE — Telephone Encounter (Signed)
Baclofen e-scribed as per Dr. Tomi Likens. Left the patient a message stating that I had done so.

## 2013-05-18 ENCOUNTER — Telehealth: Payer: Self-pay | Admitting: Neurology

## 2013-05-18 NOTE — Telephone Encounter (Signed)
Yes, she can increase the nortriptyline to 50mg  at bedtime.

## 2013-05-18 NOTE — Telephone Encounter (Signed)
Patient left a message on my vm stating that she was tolerating the Nortriptyline 25 mg and would like to increase to 50 mg at bedtime. She states that she and Dr. Tomi Likens discussed this when she was seen. Pharmacy is the [facility] on [address]. **Dr. Tomi Likens please advise. I didn't see mention of this in your last ov note.

## 2013-05-22 ENCOUNTER — Telehealth: Payer: Self-pay | Admitting: *Deleted

## 2013-05-22 MED ORDER — NORTRIPTYLINE HCL 50 MG PO CAPS
50.0000 mg | ORAL_CAPSULE | Freq: Every day | ORAL | Status: DC
Start: 2013-05-22 — End: 2013-08-31

## 2013-05-22 NOTE — Telephone Encounter (Signed)
Rx for nortriptyline 50 mg QD HS called in to [facility]  With 2 refills

## 2013-05-25 ENCOUNTER — Telehealth: Payer: Self-pay | Admitting: Neurology

## 2013-05-28 ENCOUNTER — Ambulatory Visit (INDEPENDENT_AMBULATORY_CARE_PROVIDER_SITE_OTHER): Payer: BC Managed Care – PPO | Admitting: Family

## 2013-05-28 ENCOUNTER — Encounter: Payer: Self-pay | Admitting: Family

## 2013-05-28 VITALS — BP 100/70 | HR 110 | Temp 98.9°F | Resp 16 | Ht 65.75 in | Wt 150.0 lb

## 2013-05-28 DIAGNOSIS — B9789 Other viral agents as the cause of diseases classified elsewhere: Secondary | ICD-10-CM

## 2013-05-28 DIAGNOSIS — R509 Fever, unspecified: Secondary | ICD-10-CM

## 2013-05-28 DIAGNOSIS — B349 Viral infection, unspecified: Secondary | ICD-10-CM

## 2013-05-28 LAB — POCT INFLUENZA A/B
Influenza A, POC: NEGATIVE
Influenza B, POC: NEGATIVE

## 2013-05-28 LAB — POCT RAPID STREP A: Rapid Strep A Screen: NEGATIVE

## 2013-05-28 NOTE — Assessment & Plan Note (Signed)
Suspect viral illness. Tylenol and Ibuprofen for symptoms. Hydrate with fluids. Follow up in 4 days if symptoms do not improve.

## 2013-05-28 NOTE — Progress Notes (Signed)
Subjective:    Patient ID: Shannon [name], female    DOB: [birth year], 53 y.o.   MRN: [id number]  Fever  Associated symptoms include abdominal pain, diarrhea, headaches and nausea. Pertinent negatives include no chest pain, coughing or vomiting.   Shannon [name] is a 53 year old female who presents today with a chief complaint of body aches, chills, sweats, fever of 102 at home, headache, sore throat. Patient reports symptoms started Sunday morning and have worsened throughout the night. Patient reports taking Tylenol with minimal relief of body aches and fever.     Review of Systems  Constitutional: Positive for fever and chills.  HENT: Positive for rhinorrhea and sinus pressure.   Respiratory: Negative for cough and shortness of breath.   Cardiovascular: Negative for chest pain.  Gastrointestinal: Positive for nausea, abdominal pain and diarrhea. Negative for vomiting.       Reports discomfort to bilateral upper quadrants.  Genitourinary: Negative for dysuria and difficulty urinating.  Musculoskeletal: Positive for neck pain.  Neurological: Positive for headaches.       Patient has history of migraines but reports this headache feels different.   Past Medical History  Diagnosis Date  . Anemia   . Depression   . Eating disorder     anorexia nervosa in her 20's  . Fainting spell   . Kidney stones     passed on their own  . Hx of laminectomy 1994    L4-5  . Stomach disorder     due to complications with cholecystectomy "almost died"  . Anxiety   . Migraine     History   Social History  . Marital Status: Married    Spouse Name: N/A    Number of Children: 3  . Years of Education: N/A   Occupational History  .     Social History Main Topics  . Smoking status: Never Smoker   . Smokeless tobacco: Never Used  . Alcohol Use: No  . Drug Use: No  . Sexual Activity: Yes    Birth Control/ Protection: Surgical   Other Topics Concern  . Not on file   Social History  Narrative   Regular exercise:  Yes (swim instructor)   Caffeine Use:  1 daily   Married 3 children ages 20 son, 23 son, and 65 daughter   Associates degree                Past Surgical History  Procedure Laterality Date  . Cholecystectomy  08/2009    reports history of gallbladder polyps, she reports stents in duct of lushca   . Appendectomy  1976  . Tonsillectomy  1982  . Laminectomy  1994    L4-5  . Tubal ligation    . Hysteroscopy  07/2011    Family History  Problem Relation Age of Onset  . Hypertension Mother   . Diabetes Father   . Hypertension Father   . Alcohol abuse Maternal Grandmother   . Cancer Paternal Grandfather     lung cancer  . Hashimoto's thyroiditis Sister     Allergies  Allergen Reactions  . Eszopiclone     Headaches  . Flexeril [Cyclobenzaprine Hcl] Other (See Comments)    Mood swings  . Gabapentin     sedation  . Morphine And Related Other (See Comments)    No relief  . Nsaids Other (See Comments)    GI bleed  . Paroxetine Nausea Only       . Shellfish Allergy  Other (See Comments)    skin & mouth tingle  . Tizanidine Hcl Other (See Comments)    Dizziness, mood swings    Current Outpatient Prescriptions on File Prior to Visit  Medication Sig Dispense Refill  . baclofen (LIORESAL) 10 MG tablet Take 0.5 tablets (5 mg total) by mouth 3 (three) times daily as needed for muscle spasms.  45 tablet  0  . busPIRone (BUSPAR) 15 MG tablet Take 30 mg by mouth 2 (two) times daily.      . hydrOXYzine (VISTARIL) 25 MG capsule Take 25 mg by mouth 3 (three) times daily as needed (2 to 3 times a day).      . Linaclotide (LINZESS PO) Take 1 capsule by mouth daily.      Marland Kitchen lurasidone (LATUDA) 80 MG TABS tablet Take 80 mg by mouth daily with breakfast.      . nortriptyline (PAMELOR) 50 MG capsule Take 1 capsule (50 mg total) by mouth at bedtime.  30 capsule  2  . PARoxetine (PAXIL) 40 MG tablet Take 1 tablet (40 mg total) by mouth daily. For anxiety and  depression.  30 tablet  0  . SUMAtriptan (IMITREX) 100 MG tablet Take 1 tablet (100 mg total) by mouth every 2 (two) hours as needed for migraine or headache. May repeat in 2 hours if headache persists or recurs.  9 tablet  3   No current facility-administered medications on file prior to visit.    BP 100/70  Pulse 110  Temp(Src) 98.9 F (37.2 C) (Oral)  Resp 16  Ht 5' 5.75" (1.67 m)  Wt 150 lb (68.04 kg)  BMI 24.40 kg/m2  SpO2 99%       Objective:   Physical Exam  Constitutional: She is oriented to person, place, and time. She appears well-nourished.  HENT:  Head: Normocephalic.  Right Ear: External ear normal.  Left Ear: External ear normal.  Nose: Nose normal.  Mouth/Throat: Oropharynx is clear and moist. No oropharyngeal exudate.  Eyes: Pupils are equal, round, and reactive to light.  Neck: Neck supple.  Cardiovascular: Regular rhythm.   Pulmonary/Chest: Effort normal and breath sounds normal. No respiratory distress. She has no wheezes.  Abdominal: Soft. Bowel sounds are normal. There is tenderness.  Generalized tenderness.  Lymphadenopathy:    She has no cervical adenopathy.  Neurological: She is alert and oriented to person, place, and time.  Skin: Skin is warm and dry.  Psychiatric: She has a normal mood and affect.          Assessment & Plan:  I have personally seen and examined patient and agree with Alma Friendly NP student's assessment and plan.

## 2013-05-28 NOTE — Progress Notes (Signed)
Pre visit review using our clinic review tool, if applicable. No additional management support is needed unless otherwise documented below in the visit note. 

## 2013-05-28 NOTE — Patient Instructions (Addendum)
Take Tylenol or Ibuprofen every 6 hours as needed for body aches and fever. Drink plenty of fluids and remain hydrated. Follow up in 4 days if symptoms do not improve.

## 2013-06-13 ENCOUNTER — Telehealth: Payer: Self-pay | Admitting: Family

## 2013-06-13 NOTE — Telephone Encounter (Signed)
Received call back from pt. She is asymptomatic and states she did not get her flu vaccine. Pediatrician told her to call us for prophylactic treatment.  Pt requests rx be sent to [facility] at [facility].

## 2013-06-13 NOTE — Telephone Encounter (Signed)
Daughter is diagnosed with the flu, request Tamiflu to be called into [facility]

## 2013-06-13 NOTE — Telephone Encounter (Signed)
Attempted to reach pt and spoke with pt's husband. He states pt does not have her phone with her today and to call her daughter's # [name]) at [phone].  Left detailed message for pt to let us know if she is currently having any symptoms.

## 2013-06-14 MED ORDER — OSELTAMIVIR PHOSPHATE 75 MG PO CAPS: 75.0000 mg | ORAL_CAPSULE | Freq: Every day | ORAL | Status: DC

## 2013-06-14 NOTE — Telephone Encounter (Signed)
Rx has been sent  

## 2013-07-02 ENCOUNTER — Ambulatory Visit: Payer: Self-pay | Admitting: Neurology

## 2013-07-11 ENCOUNTER — Ambulatory Visit (INDEPENDENT_AMBULATORY_CARE_PROVIDER_SITE_OTHER): Payer: BC Managed Care – PPO | Admitting: Neurology

## 2013-07-11 ENCOUNTER — Encounter: Payer: Self-pay | Admitting: Neurology

## 2013-07-11 VITALS — BP 118/68 | HR 68 | Temp 97.0°F | Resp 16 | Ht 66.0 in | Wt 145.7 lb

## 2013-07-11 DIAGNOSIS — M542 Cervicalgia: Secondary | ICD-10-CM

## 2013-07-11 DIAGNOSIS — R51 Headache: Secondary | ICD-10-CM

## 2013-07-11 DIAGNOSIS — M549 Dorsalgia, unspecified: Secondary | ICD-10-CM

## 2013-07-11 DIAGNOSIS — G4486 Cervicogenic headache: Secondary | ICD-10-CM

## 2013-07-11 MED ORDER — DULOXETINE HCL 30 MG PO CPEP: 30.0000 mg | ORAL_CAPSULE | Freq: Every day | ORAL | Status: DC

## 2013-07-11 NOTE — Patient Instructions (Addendum)
1.  We will start Cymbalta 30mg  at bedtime.  Side effects include sleepiness and dizziness.  Call in 4 weeks with update.   2.  MRI of cervical spine [place] [facility]  Radiology  07/25/13 @11 :45 am 3.  Follow up in 2 months.
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
## 2013-07-25 ENCOUNTER — Ambulatory Visit (HOSPITAL_COMMUNITY): Admission: RE | Admit: 2013-07-25 | Payer: BC Managed Care – PPO | Source: Ambulatory Visit

## 2013-08-10 ENCOUNTER — Ambulatory Visit (HOSPITAL_COMMUNITY): Admission: RE | Admit: 2013-08-10 | Payer: BC Managed Care – PPO | Source: Ambulatory Visit

## 2013-08-13 ENCOUNTER — Telehealth: Payer: Self-pay | Admitting: Neurology

## 2013-08-13 NOTE — Telephone Encounter (Signed)
I spoke with this patient and advised her Per Dr Tomi Likens to contact her PCP about the fainting episode . Dr Tomi Likens would like to order a EEG I will call her with day and time . She will keep MRI  appt and I will call in ativan 5 mg #2  0 refills  I will also request medical record from [facility] .

## 2013-08-13 NOTE — Telephone Encounter (Signed)
Ativan .5 mg x 2  Take po 1 hour prior to MRI  NO refills called to [facility]
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
## 2013-08-13 NOTE — Telephone Encounter (Signed)
Patient returning called to [facility]. Please call [phone] / Sherri S.

## 2013-08-13 NOTE — Telephone Encounter (Signed)
Pt called requesting to speak to a nurse regarding her fainting about 2 weeks ago and she is having strange symptoms.  Please call Pt.

## 2013-08-13 NOTE — Telephone Encounter (Signed)
She should contact her PCP regarding this, especially since I never saw her for fainting.  In the meantime, I would also order a routine EEG for syncope.  Judging by her past records, she had an MRI of the brain performed in 2012 for syncope as ordered by [facility].  So it appears that she had a neurological workup for this in the past.  I would like to get these notes.

## 2013-08-28 ENCOUNTER — Ambulatory Visit (HOSPITAL_COMMUNITY)
Admission: RE | Admit: 2013-08-28 | Discharge: 2013-08-28 | Disposition: A | Discharge status: Home / Self Care | Payer: BC Managed Care – PPO | Source: Ambulatory Visit | Attending: Neurology | Admitting: Neurology

## 2013-08-28 DIAGNOSIS — M79609 Pain in unspecified limb: Secondary | ICD-10-CM | POA: Insufficient documentation

## 2013-08-28 DIAGNOSIS — M542 Cervicalgia: Secondary | ICD-10-CM | POA: Insufficient documentation

## 2013-08-28 DIAGNOSIS — R51 Headache: Secondary | ICD-10-CM

## 2013-08-28 MED ORDER — GADOBENATE DIMEGLUMINE 529 MG/ML IV SOLN
15.0000 mL | Freq: Once | INTRAVENOUS | Status: AC | PRN
  Administered 2013-08-28: 13 mL via INTRAVENOUS

## 2013-08-31 ENCOUNTER — Ambulatory Visit (INDEPENDENT_AMBULATORY_CARE_PROVIDER_SITE_OTHER): Payer: BC Managed Care – PPO | Admitting: Family

## 2013-08-31 ENCOUNTER — Encounter: Payer: Self-pay | Admitting: Family

## 2013-08-31 VITALS — BP 120/75 | HR 63 | Temp 97.8°F | Resp 16 | Ht 65.75 in | Wt 144.0 lb

## 2013-08-31 DIAGNOSIS — R55 Syncope and collapse: Secondary | ICD-10-CM

## 2013-08-31 DIAGNOSIS — F341 Dysthymic disorder: Secondary | ICD-10-CM

## 2013-08-31 DIAGNOSIS — F32A Depression, unspecified: Secondary | ICD-10-CM

## 2013-08-31 DIAGNOSIS — F329 Major depressive disorder, single episode, unspecified: Secondary | ICD-10-CM

## 2013-08-31 DIAGNOSIS — F419 Anxiety disorder, unspecified: Secondary | ICD-10-CM

## 2013-08-31 LAB — CBC WITH DIFFERENTIAL/PLATELET
Basophils Absolute: 0 10*3/uL (ref 0.0–0.1)
Basophils Relative: 0 % (ref 0–1)
Eosinophils Absolute: 0.1 10*3/uL (ref 0.0–0.7)
Eosinophils Relative: 2 % (ref 0–5)
HCT: 41.5 % (ref 36.0–46.0)
Hemoglobin: 14.5 g/dL (ref 12.0–15.0)
Lymphocytes Relative: 42 % (ref 12–46)
Lymphs Abs: 2.1 10*3/uL (ref 0.7–4.0)
MCH: 31.5 pg (ref 26.0–34.0)
MCHC: 34.9 g/dL (ref 30.0–36.0)
MCV: 90 fL (ref 78.0–100.0)
MONO ABS: 0.5 10*3/uL (ref 0.1–1.0)
MONOS PCT: 11 % (ref 3–12)
Neutro Abs: 2.2 10*3/uL (ref 1.7–7.7)
Neutrophils Relative %: 45 % (ref 43–77)
Platelets: 320 10*3/uL (ref 150–400)
RBC: 4.61 MIL/uL (ref 3.87–5.11)
RDW: 12.8 % (ref 11.5–15.5)
WBC: 4.9 10*3/uL (ref 4.0–10.5)

## 2013-08-31 LAB — BASIC METABOLIC PANEL
BUN: 14 mg/dL (ref 6–23)
CHLORIDE: 102 meq/L (ref 96–112)
CO2: 28 meq/L (ref 19–32)
Calcium: 9.3 mg/dL (ref 8.4–10.5)
Creat: 0.8 mg/dL (ref 0.50–1.10)
Glucose, Bld: 92 mg/dL (ref 70–99)
Potassium: 4.3 mEq/L (ref 3.5–5.3)
SODIUM: 138 meq/L (ref 135–145)

## 2013-08-31 LAB — D-DIMER, QUANTITATIVE: D-Dimer, Quant: 0.27 ug/mL-FEU (ref 0.00–0.48)

## 2013-08-31 NOTE — Assessment & Plan Note (Addendum)
Son has seizure disorder.  Per hx sounds like it could have been a seizure.  Will obtain EKG today along with a D. Dimer. EKG notes Sinus bradycardia.  HR 53- this is baseline for this patient. If. D. Dimer + will need CTA chest to rule out PE.  She has an upcoming appointment with Dr. Tomi Likens and I have advised her to keep this appointment. She is advised that per [place] law, she should not drive for 6 months following syncopal event.  Needs to be seizure free/syncope free x 6 months to resume driving. She verbalizes understanding.  I have also advised her not to swim alone without another adult present.

## 2013-08-31 NOTE — Progress Notes (Signed)
Subjective:    Patient ID: Shannon [name], female    DOB: [birth year], 53 y.o.   MRN: [id number]  HPI  Head snapped back, right arm jerked.  Fell down- unconscious for 15-30 seconds.  When she woke up she could not talk.  Declined EMS.  Since that time her anxiety has been worse. Feels like she can't catch her breath. Feels shakey and panicky.  She is working with Dr. Darrel Reach therapist at [facility].  Also follows with Lissa Hoard- PA-c at [facility].  Stopped all psych meds 8-10 weeks ago.  Reports that she feels anxiety is bigger problem than depression.  Her son is no longer  Living at home, daughter is about to graduate and leave for college. Mother in law sick with breast cancer.  Will start swim lessons at her house soon which will keep her busier.  Denies SI.          Review of Systems    see HPI  Past Medical History  Diagnosis Date  . Anemia   . Depression   . Eating disorder     anorexia nervosa in her 20's  . Fainting spell   . Kidney stones     passed on their own  . Hx of laminectomy 1994    L4-5  . Stomach disorder     due to complications with cholecystectomy "almost died"  . Anxiety   . Migraine     History   Social History  . Marital Status: Married    Spouse Name: N/A    Number of Children: 3  . Years of Education: N/A   Occupational History  .     Social History Main Topics  . Smoking status: Never Smoker   . Smokeless tobacco: Never Used  . Alcohol Use: No  . Drug Use: No  . Sexual Activity: Yes    Birth Control/ Protection: Surgical   Other Topics Concern  . Not on file   Social History Narrative   Regular exercise:  Yes (swim instructor)   Caffeine Use:  1 daily   Married 3 children ages 54 son, 39 son, and 45 daughter   Associates degree                Past Surgical History  Procedure Laterality Date  . Cholecystectomy  08/2009    reports history of gallbladder polyps, she reports stents in duct of lushca   . Appendectomy   1976  . Tonsillectomy  1982  . Laminectomy  1994    L4-5  . Tubal ligation    . Hysteroscopy  07/2011    Family History  Problem Relation Age of Onset  . Hypertension Mother   . Diabetes Father   . Hypertension Father   . Alcohol abuse Maternal Grandmother   . Cancer Paternal Grandfather     lung cancer  . Hashimoto's thyroiditis Sister     Allergies  Allergen Reactions  . Eszopiclone     Headaches  . Flexeril [Cyclobenzaprine Hcl] Other (See Comments)    Mood swings  . Gabapentin     sedation  . Morphine And Related Other (See Comments)    No relief  . Nsaids Other (See Comments)    GI bleed  . Paroxetine Nausea Only       . Shellfish Allergy Other (See Comments)    skin & mouth tingle  . Tizanidine Hcl Other (See Comments)    Dizziness, mood swings    Current Outpatient Prescriptions  on File Prior to Visit  Medication Sig Dispense Refill  . SUMAtriptan (IMITREX) 100 MG tablet Take 1 tablet (100 mg total) by mouth every 2 (two) hours as needed for migraine or headache. May repeat in 2 hours if headache persists or recurs.  9 tablet  3   No current facility-administered medications on file prior to visit.    BP 80/60  Pulse 63  Temp(Src) 97.8 F (36.6 C) (Oral)  Resp 16  Ht 5' 5.75" (1.67 m)  Wt 144 lb (65.318 kg)  BMI 23.42 kg/m2  SpO2 99%  LMP 08/31/2013    Objective:   Physical Exam  Constitutional: She is oriented to person, place, and time. She appears well-developed and well-nourished. No distress.  Cardiovascular: Normal rate and regular rhythm.   No murmur heard. Pulmonary/Chest: Effort normal and breath sounds normal. No respiratory distress. She has no wheezes. She has no rales. She exhibits no tenderness.  Neurological: She is alert and oriented to person, place, and time.  Psychiatric:  Briefly tearful during interview          Assessment & Plan:

## 2013-08-31 NOTE — Assessment & Plan Note (Signed)
Depression seems well controlled at present but anxiety is deteriorated. She has been on klonopin 1mg  tabs in the past which she felt was "too strong."  I advised her to contact her psychiatrist to discuss resuming at 0.5mg .  Pt verbalizes understanding.

## 2013-08-31 NOTE — Progress Notes (Signed)
Pre visit review using our clinic review tool, if applicable. No additional management support is needed unless otherwise documented below in the visit note. 

## 2013-08-31 NOTE — Patient Instructions (Signed)
Do not swim alone. Keep your upcoming appointment with Dr. Tomi Likens. Schedule follow up with your psychiatrist to discuss resuming klonopin at 0.5mg  dose.  Complete lab work prior to leaving. Do not drive for 6 months- (need to be seizure free/no fainting spells x 6 months) Follow up in 6 weeks.

## 2013-09-03 ENCOUNTER — Encounter: Payer: Self-pay | Admitting: Family

## 2013-09-03 ENCOUNTER — Other Ambulatory Visit: Payer: Self-pay | Admitting: *Deleted

## 2013-09-03 DIAGNOSIS — R55 Syncope and collapse: Secondary | ICD-10-CM

## 2013-09-03 DIAGNOSIS — R51 Headache: Secondary | ICD-10-CM

## 2013-09-10 ENCOUNTER — Ambulatory Visit: Payer: Self-pay | Admitting: Neurology

## 2013-09-11 ENCOUNTER — Telehealth: Payer: Self-pay | Admitting: *Deleted

## 2013-09-11 ENCOUNTER — Other Ambulatory Visit: Payer: Self-pay

## 2013-09-11 ENCOUNTER — Telehealth: Payer: Self-pay | Admitting: Neurology

## 2013-09-11 ENCOUNTER — Ambulatory Visit (INDEPENDENT_AMBULATORY_CARE_PROVIDER_SITE_OTHER): Payer: BC Managed Care – PPO | Admitting: Neurology

## 2013-09-11 DIAGNOSIS — R55 Syncope and collapse: Secondary | ICD-10-CM

## 2013-09-11 DIAGNOSIS — R51 Headache: Secondary | ICD-10-CM

## 2013-09-11 MED ORDER — PROMETHAZINE HCL 25 MG PO TABS: 25.0000 mg | ORAL_TABLET | Freq: Three times a day (TID) | ORAL | Status: DC | PRN

## 2013-09-11 NOTE — Telephone Encounter (Signed)
Pt left message on voicemail that she just had her EEG this morning. Now has a headache. Has vomited once and is nauseated. Pt states she used to take Promethazine for the nausea when she developed migraines and is requesting Rx?  Pt also left message with Dr Georgie Chard office. Please advise.

## 2013-09-11 NOTE — Telephone Encounter (Signed)
Patient was advised to take the Imitrex after the two hours and see it that helps she states she is already feeling some better

## 2013-09-11 NOTE — Telephone Encounter (Signed)
Did she ever repeat the Imitrex after two hours?  The problem is that she has contraindications to many medications such as NSAIDs.  If she is throwing up the Imitrex, we can prescribe the injection.

## 2013-09-11 NOTE — Telephone Encounter (Signed)
Pt called requesting to speak to a nurse regarding her having severe migraines and would like to know what she can do.

## 2013-09-11 NOTE — Telephone Encounter (Signed)
Rx sent to her pharmacy 

## 2013-09-11 NOTE — Telephone Encounter (Signed)
Notified pt. 

## 2013-09-11 NOTE — Telephone Encounter (Signed)
Patient calls stating since her EEG this am  She has a severe headache with nausea and vomiting x1  Please advise

## 2013-09-14 ENCOUNTER — Ambulatory Visit (INDEPENDENT_AMBULATORY_CARE_PROVIDER_SITE_OTHER): Payer: BC Managed Care – PPO | Admitting: Neurology

## 2013-09-14 ENCOUNTER — Encounter: Payer: Self-pay | Admitting: Neurology

## 2013-09-14 VITALS — BP 118/64 | HR 68 | Temp 97.5°F | Resp 16 | Ht 66.0 in | Wt 145.3 lb

## 2013-09-14 DIAGNOSIS — R569 Unspecified convulsions: Secondary | ICD-10-CM

## 2013-09-14 DIAGNOSIS — M5416 Radiculopathy, lumbar region: Secondary | ICD-10-CM

## 2013-09-14 DIAGNOSIS — M542 Cervicalgia: Secondary | ICD-10-CM

## 2013-09-14 DIAGNOSIS — IMO0002 Reserved for concepts with insufficient information to code with codable children: Secondary | ICD-10-CM

## 2013-09-14 DIAGNOSIS — M5412 Radiculopathy, cervical region: Secondary | ICD-10-CM

## 2013-09-14 DIAGNOSIS — G43009 Migraine without aura, not intractable, without status migrainosus: Secondary | ICD-10-CM

## 2013-09-14 DIAGNOSIS — M5010 Cervical disc disorder with radiculopathy, unspecified cervical region: Secondary | ICD-10-CM
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
## 2013-09-17 NOTE — Procedures (Signed)
ELECTROENCEPHALOGRAM REPORT  Date of Study: 09/11/2013  Patient's Name: Shannon [name] MRN: [id number] Date of Birth: 04-18-1961  Indication: Syncope  Medications: Melatonin, sumatriptan  Technical Summary: This is a multichannel digital EEG recording, using the international 10-20 placement system.  Spike detection software was employed.  Description: The EEG background is symmetric, with a well-developed posterior dominant rhythm of 9 Hz, which is reactive to eye opening and closing.  Diffuse beta activity is seen, with a bilateral frontal preponderance.  No focal or generalized abnormalities are seen.  No focal or generalized epileptiform discharges are seen.  Stage II sleep is not seen.  Hyperventilation and photic stimulation were performed, and produced no abnormalities.  ECG revealed normal cardiac rate and rhythm.  Impression: This is a normal routine EEG of the awake and drowsy states, with activating procedures.  A normal study does not rule out the possibility of a seizure disorder in this patient.  Adam R. Tomi Likens, DO

## 2013-09-19 ENCOUNTER — Telehealth: Payer: Self-pay | Admitting: *Deleted

## 2013-09-19 NOTE — Telephone Encounter (Signed)
Notified pt and scheduled appt for 09/25/13 at 8:15.

## 2013-09-19 NOTE — Telephone Encounter (Signed)
Lets see her back in the office to discuss.  She should address immediate concerns with her psychiatrist until we can see her back.

## 2013-09-19 NOTE — Telephone Encounter (Signed)
Pt left message requesting that we assume management of her anxiety / depression medications. Does not feel comfortable with therapist at present. States she has been having severe anxiety and panic attacks. States she was unable to get appt with [place] today and will be going out of town tomorrow.  Please advise re: medication management / recommendations.

## 2013-09-21 ENCOUNTER — Telehealth: Payer: Self-pay | Admitting: Neurology

## 2013-09-21 NOTE — Telephone Encounter (Signed)
Pt needs to talk to someone today about her MRI that is on for tomorrow please call [phone]

## 2013-09-21 NOTE — Telephone Encounter (Signed)
Patient is going out of town. She would like to cancel MR. MR cancelled and she will call back to r/s.

## 2013-09-22 ENCOUNTER — Ambulatory Visit (HOSPITAL_BASED_OUTPATIENT_CLINIC_OR_DEPARTMENT_OTHER): Payer: BC Managed Care – PPO

## 2013-09-25 ENCOUNTER — Ambulatory Visit (INDEPENDENT_AMBULATORY_CARE_PROVIDER_SITE_OTHER): Payer: BC Managed Care – PPO | Admitting: Family

## 2013-09-25 ENCOUNTER — Encounter: Payer: Self-pay | Admitting: Family

## 2013-09-25 VITALS — BP 90/60 | HR 75 | Temp 98.1°F | Resp 16 | Ht 65.75 in | Wt 143.1 lb

## 2013-09-25 DIAGNOSIS — F329 Major depressive disorder, single episode, unspecified: Secondary | ICD-10-CM

## 2013-09-25 DIAGNOSIS — F411 Generalized anxiety disorder: Secondary | ICD-10-CM

## 2013-09-25 DIAGNOSIS — F341 Dysthymic disorder: Secondary | ICD-10-CM

## 2013-09-25 DIAGNOSIS — F419 Anxiety disorder, unspecified: Secondary | ICD-10-CM

## 2013-09-25 DIAGNOSIS — F32A Depression, unspecified: Secondary | ICD-10-CM

## 2013-09-25 MED ORDER — CLONAZEPAM 0.5 MG PO TABS: 0.5000 mg | ORAL_TABLET | Freq: Two times a day (BID) | ORAL | Status: DC

## 2013-09-25 MED ORDER — ESCITALOPRAM OXALATE 10 MG PO TABS: ORAL_TABLET | ORAL | Status: DC

## 2013-09-25 NOTE — Progress Notes (Signed)
Subjective:    Patient ID: Shannon [name], female    DOB: Apr 04, 1961, 53 y.o.   MRN: [id number]  HPI  Shannon [name] is a 53 yr old female who presents today with chief complaint of anxiety.  Current meds include clonazepam.  Feels like she is impatient. She is having trouble sleeping.  She has been on paxil in the past but did not like the associated weight gain. Reports anxiety was unchanged.  Feels anxious about her daughter graduating high school on Friday. She feels like her psychiatrist is not listening to her.  She continues to work with her therapist-  Dr. Darrel Reach at [facility].  Sees him every 2 weeks.  She denies SI/HI.    Review of Systems    see HPI  Past Medical History  Diagnosis Date  . Anemia   . Depression   . Eating disorder     anorexia nervosa in her 20's  . Fainting spell   . Kidney stones     passed on their own  . Hx of laminectomy 1994    L4-5  . Stomach disorder     due to complications with cholecystectomy "almost died"  . Anxiety   . Migraine     History   Social History  . Marital Status: Married    Spouse Name: N/A    Number of Children: 3  . Years of Education: N/A   Occupational History  .     Social History Main Topics  . Smoking status: Never Smoker   . Smokeless tobacco: Never Used  . Alcohol Use: No  . Drug Use: Yes  . Sexual Activity: Yes    Partners: Male    Birth Control/ Protection: Surgical   Other Topics Concern  . Not on file   Social History Narrative   Regular exercise:  Yes (swim instructor)   Caffeine Use:  1 daily   Married 3 children ages 56 son, 2 son, and 41 daughter   Associates degree                Past Surgical History  Procedure Laterality Date  . Cholecystectomy  08/2009    reports history of gallbladder polyps, she reports stents in duct of lushca   . Appendectomy  1976  . Tonsillectomy  1982  . Laminectomy  1994    L4-5  . Tubal ligation    . Hysteroscopy  07/2011    Family History    Problem Relation Age of Onset  . Hypertension Mother   . Diabetes Father   . Hypertension Father   . Alcohol abuse Maternal Grandmother   . Cancer Paternal Grandfather     lung cancer  . Hashimoto's thyroiditis Sister     Allergies  Allergen Reactions  . Eszopiclone     Headaches  . Flexeril [Cyclobenzaprine Hcl] Other (See Comments)    Mood swings  . Gabapentin     sedation  . Morphine And Related Other (See Comments)    No relief  . Nsaids Other (See Comments)    GI bleed  . Paroxetine Nausea Only       . Shellfish Allergy Other (See Comments)    skin & mouth tingle  . Tizanidine Hcl Other (See Comments)    Dizziness, mood swings    Current Outpatient Prescriptions on File Prior to Visit  Medication Sig Dispense Refill  . diazepam (VALIUM) 5 MG tablet Take 5 mg by mouth once. Mri ONE TIME ONLY      .  Ginkgo Biloba Extract 60 MG CAPS Take 2 capsules by mouth 2 (two) times daily.      . Melatonin 5 MG CAPS Take 1 capsule by mouth at bedtime.      . promethazine (PHENERGAN) 25 MG tablet Take 1 tablet (25 mg total) by mouth every 8 (eight) hours as needed for nausea or vomiting.  20 tablet  0  . SUMAtriptan (IMITREX) 100 MG tablet Take 1 tablet (100 mg total) by mouth every 2 (two) hours as needed for migraine or headache. May repeat in 2 hours if headache persists or recurs.  9 tablet  3   No current facility-administered medications on file prior to visit.    BP 90/60  Pulse 75  Temp(Src) 98.1 F (36.7 C) (Oral)  Resp 16  Ht 5' 5.75" (1.67 m)  Wt 143 lb 1.9 oz (64.919 kg)  BMI 23.28 kg/m2  SpO2 99%  LMP 08/31/2013    Objective:   Physical Exam  Constitutional: She is oriented to person, place, and time. She appears well-developed and well-nourished. No distress.  Neurological: She is alert and oriented to person, place, and time.  Psychiatric: Her behavior is normal. Judgment and thought content normal.  tearful          Assessment & Plan:

## 2013-09-25 NOTE — Assessment & Plan Note (Signed)
Anxiety uncontrolled. Depression is controlled. Will give trial of lexapro, continue work with therapist.  Lexapro- I instructed pt to start 1/2 tablet once daily for 1 week and then increase to a full tablet once daily on week two as tolerated.    Also discussed rare but serious side effect of suicide ideation.  She is instructed to discontinue medication go directly to ED if this occurs.  Pt verbalizes understanding.  Plan follow up in 1 month to evaluate progress.

## 2013-09-25 NOTE — Patient Instructions (Signed)
You will be contacted about your referral to psychiatry.  Please let us know if you have not heard back within 1 week about your referral. Start lexapro 1/2 tab by mouth once daily, then increase to a full tab once daily. Follow up in 1 month.

## 2013-09-25 NOTE — Progress Notes (Signed)
Pre visit review using our clinic review tool, if applicable. No additional management support is needed unless otherwise documented below in the visit note. 

## 2013-10-02 ENCOUNTER — Telehealth: Payer: Self-pay | Admitting: *Deleted

## 2013-10-02 MED ORDER — SUMATRIPTAN SUCCINATE 100 MG PO TABS: 100.0000 mg | ORAL_TABLET | ORAL | Status: DC | PRN

## 2013-10-02 NOTE — Telephone Encounter (Signed)
Rx printed and faxed to [facility]. Notified pt.

## 2013-10-02 NOTE — Telephone Encounter (Signed)
More likely due to migraine.  Refill sent of imitrex.  If no improvement with imitrex, hold lexapro and call me.

## 2013-10-02 NOTE — Telephone Encounter (Signed)
Pt called stating she started Lexapro last week. Is currently out of imitrex refills. Has had nausea, headache and dry heaves the last 2 days. Pt needs refill of imitrex and is wondering if this is side effect of Lexapro?  Please advise.

## 2013-10-05 ENCOUNTER — Ambulatory Visit (INDEPENDENT_AMBULATORY_CARE_PROVIDER_SITE_OTHER): Payer: BC Managed Care – PPO | Admitting: *Deleted

## 2013-10-05 ENCOUNTER — Other Ambulatory Visit: Payer: Self-pay | Admitting: *Deleted

## 2013-10-05 ENCOUNTER — Telehealth: Payer: Self-pay | Admitting: *Deleted

## 2013-10-05 ENCOUNTER — Telehealth: Payer: Self-pay | Admitting: Neurology

## 2013-10-05 DIAGNOSIS — R51 Headache: Secondary | ICD-10-CM

## 2013-10-05 MED ORDER — RIZATRIPTAN BENZOATE 10 MG PO TBDP: 10.0000 mg | ORAL_TABLET | ORAL | Status: DC | PRN

## 2013-10-05 MED ORDER — KETOROLAC TROMETHAMINE 60 MG/2ML IM SOLN
60.0000 mg | Freq: Once | INTRAMUSCULAR | Status: AC
  Administered 2013-10-05: 60 mg via INTRAMUSCULAR

## 2013-10-05 NOTE — Telephone Encounter (Signed)
Pt w/ migraines. Imitrex PO has not helped. Can we do any type of injection in office to help? Please call [phone] / Sherri S.

## 2013-10-05 NOTE — Telephone Encounter (Signed)
Please advise on below note 

## 2013-10-05 NOTE — Telephone Encounter (Signed)
Patient will come for injection today 10/05/13  3 pm

## 2013-10-05 NOTE — Telephone Encounter (Signed)
She can come in for Toradol 60mg  injection.  For abortive therapy, we can prescribe her Maxalt 10mg , to take at earliest onset of migraine and repeat in 2 hours if needed.

## 2013-10-09 NOTE — Telephone Encounter (Signed)
Pt called again at 12:08PM requesting to speak to [facility].  Please call her when you get a chance # [phone]

## 2013-10-26 ENCOUNTER — Ambulatory Visit (INDEPENDENT_AMBULATORY_CARE_PROVIDER_SITE_OTHER): Payer: BC Managed Care – PPO | Admitting: Family

## 2013-10-26 ENCOUNTER — Encounter: Payer: Self-pay | Admitting: Family

## 2013-10-26 VITALS — BP 80/60 | HR 67 | Temp 98.5°F | Resp 16 | Ht 65.75 in | Wt 140.1 lb

## 2013-10-26 DIAGNOSIS — F419 Anxiety disorder, unspecified: Principal | ICD-10-CM

## 2013-10-26 DIAGNOSIS — F341 Dysthymic disorder: Secondary | ICD-10-CM

## 2013-10-26 DIAGNOSIS — R110 Nausea: Secondary | ICD-10-CM | POA: Insufficient documentation

## 2013-10-26 DIAGNOSIS — F329 Major depressive disorder, single episode, unspecified: Secondary | ICD-10-CM

## 2013-10-26 DIAGNOSIS — F32A Depression, unspecified: Secondary | ICD-10-CM

## 2013-10-26 MED ORDER — ESCITALOPRAM OXALATE 10 MG PO TABS: ORAL_TABLET | ORAL | Status: DC

## 2013-10-26 MED ORDER — CLONAZEPAM 0.5 MG PO TABS: 0.5000 mg | ORAL_TABLET | Freq: Two times a day (BID) | ORAL | Status: DC

## 2013-10-26 NOTE — Assessment & Plan Note (Signed)
Improved on lexapro.  Continue lexapro and clonazepam.

## 2013-10-26 NOTE — Progress Notes (Signed)
Subjective:    Patient ID: Shannon [name], female    DOB: 09/08/1960, 53 y.o.   MRN: [id number]  HPI  Ms. Angello is a 53 yr old female who presents today for follow up.  1) anxiety and depression- Last visit she was placed on lexapro. Anxiety is better.  Reports depression is improved as well. She is no longer having crying spells. Wants to continue lexapro and is requesting refills.   2) Nausea- reports that since she started lexapro she is having daily nausea. She takes lexapro in the morning.  Denies abdominal pain.  Review of Systems See HPI  Past Medical History  Diagnosis Date  . Anemia   . Depression   . Eating disorder     anorexia nervosa in her 20's  . Fainting spell   . Kidney stones     passed on their own  . Hx of laminectomy 1994    L4-5  . Stomach disorder     due to complications with cholecystectomy "almost died"  . Anxiety   . Migraine     History   Social History  . Marital Status: Married    Spouse Name: N/A    Number of Children: 3  . Years of Education: N/A   Occupational History  .     Social History Main Topics  . Smoking status: Never Smoker   . Smokeless tobacco: Never Used  . Alcohol Use: No  . Drug Use: Yes  . Sexual Activity: Yes    Partners: Male    Birth Control/ Protection: Surgical   Other Topics Concern  . Not on file   Social History Narrative   Regular exercise:  Yes (swim instructor)   Caffeine Use:  1 daily   Married 3 children ages 70 son, 61 son, and 28 daughter   Associates degree                Past Surgical History  Procedure Laterality Date  . Cholecystectomy  08/2009    reports history of gallbladder polyps, she reports stents in duct of lushca   . Appendectomy  1976  . Tonsillectomy  1982  . Laminectomy  1994    L4-5  . Tubal ligation    . Hysteroscopy  07/2011    Family History  Problem Relation Age of Onset  . Hypertension Mother   . Diabetes Father   . Hypertension Father   .  Alcohol abuse Maternal Grandmother   . Cancer Paternal Grandfather     lung cancer  . Hashimoto's thyroiditis Sister     Allergies  Allergen Reactions  . Eszopiclone     Headaches  . Flexeril [Cyclobenzaprine Hcl] Other (See Comments)    Mood swings  . Gabapentin     sedation  . Morphine And Related Other (See Comments)    No relief  . Nsaids Other (See Comments)    GI bleed  . Paroxetine Nausea Only       . Shellfish Allergy Other (See Comments)    skin & mouth tingle  . Tizanidine Hcl Other (See Comments)    Dizziness, mood swings    Current Outpatient Prescriptions on File Prior to Visit  Medication Sig Dispense Refill  . diazepam (VALIUM) 5 MG tablet Take 5 mg by mouth once. Mri ONE TIME ONLY      . Melatonin 5 MG CAPS Take 1 capsule by mouth at bedtime.      . promethazine (PHENERGAN) 25 MG tablet Take  1 tablet (25 mg total) by mouth every 8 (eight) hours as needed for nausea or vomiting.  20 tablet  0  . rizatriptan (MAXALT-MLT) 10 MG disintegrating tablet Take 1 tablet (10 mg total) by mouth as needed for migraine. May repeat in 2 hours if needed  9 tablet  11   No current facility-administered medications on file prior to visit.    BP 80/60  Pulse 67  Temp(Src) 98.5 F (36.9 C) (Oral)  Resp 16  Ht 5' 5.75" (1.67 m)  Wt 140 lb 1.3 oz (63.54 kg)  BMI 22.78 kg/m2  SpO2 99%  LMP 10/21/2013       Objective:   Physical Exam  Constitutional: She is oriented to person, place, and time. She appears well-developed and well-nourished. No distress.  Cardiovascular: Normal rate and regular rhythm.   No murmur heard. Pulmonary/Chest: Effort normal and breath sounds normal. No respiratory distress. She has no wheezes. She has no rales. She exhibits no tenderness.  Abdominal: Soft. Bowel sounds are normal. She exhibits no distension. There is no tenderness.  Neurological: She is alert and oriented to person, place, and time.  Psychiatric: She has a normal mood and  affect. Her behavior is normal. Judgment and thought content normal.          Assessment & Plan:

## 2013-10-26 NOTE — Progress Notes (Signed)
Pre visit review using our clinic review tool, if applicable. No additional management support is needed unless otherwise documented below in the visit note. 

## 2013-10-26 NOTE — Assessment & Plan Note (Signed)
Very likely side effect of lexapro. She does not wish to stop lexapro. Advised pt to take lexapro at bedtime. Call if symptoms worsen or do not improve.

## 2013-10-26 NOTE — Patient Instructions (Signed)
Try taking lexapro at bedtime. Follow up in 3 months.  Call sooner if nausea worsens or does not improve.

## 2013-11-05 IMAGING — CT CT HEAD W/O CM
1 series · 16 of 30 positions shown, 20 images · non-contrast
Comparison: 07/16/2011

CLINICAL DATA: Altered mental status

CT HEAD WITHOUT CONTRAST
TECHNIQUE: Contiguous axial images were obtained from the base of
the skull through the vertex without contrast.

[Series 2: head 4.8 h37s · axial · 0.45mm/px · z∈[-211,-59]mm · 16 of 36 slices shown, 20 images]
[im 2/36  brain]
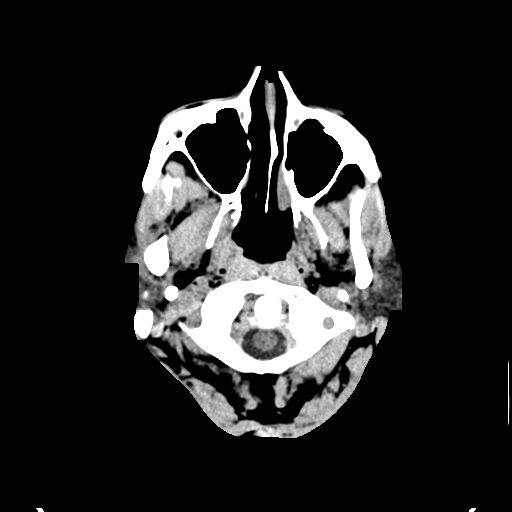
[im 2/36  bone]
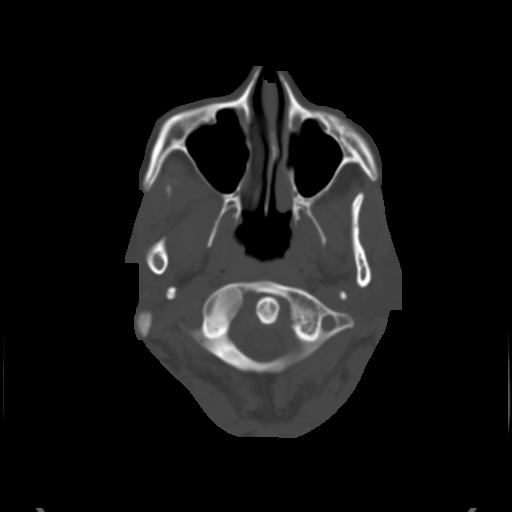
[im 4/36  brain]
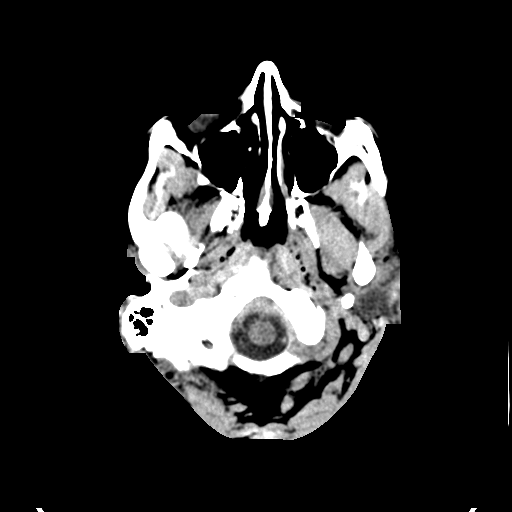
[im 7/36  brain]
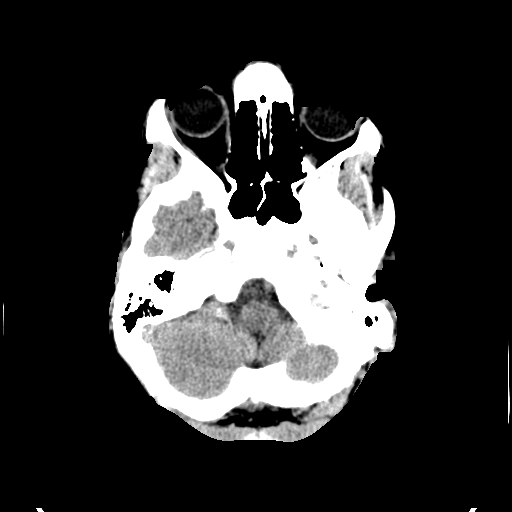
[im 9/36  brain]
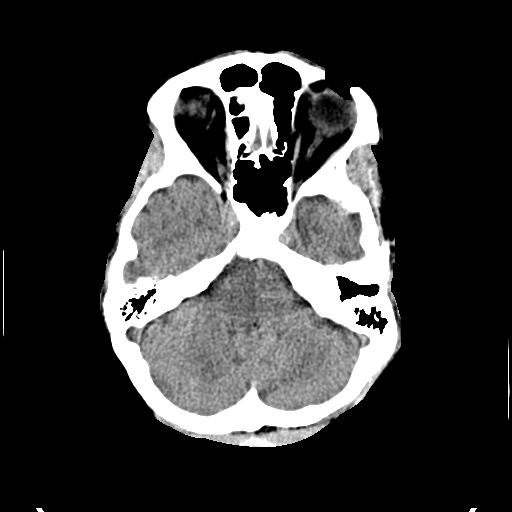
[im 10/36  brain]
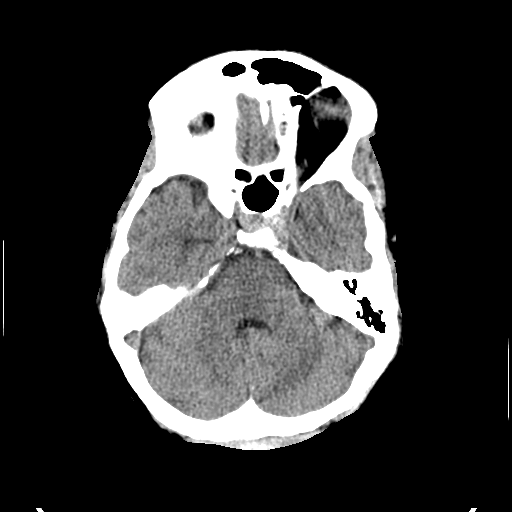
[im 10/36  bone]
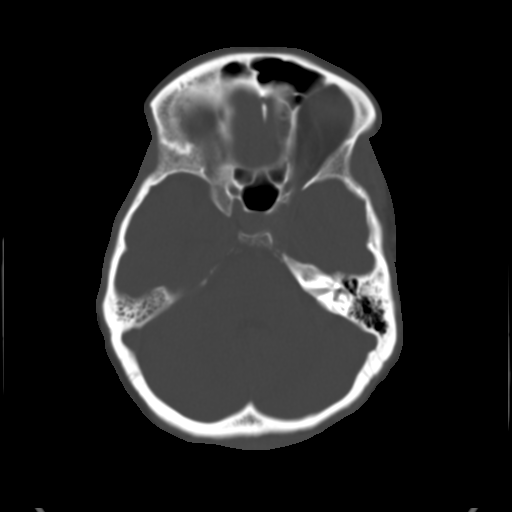
[im 13/36  brain]
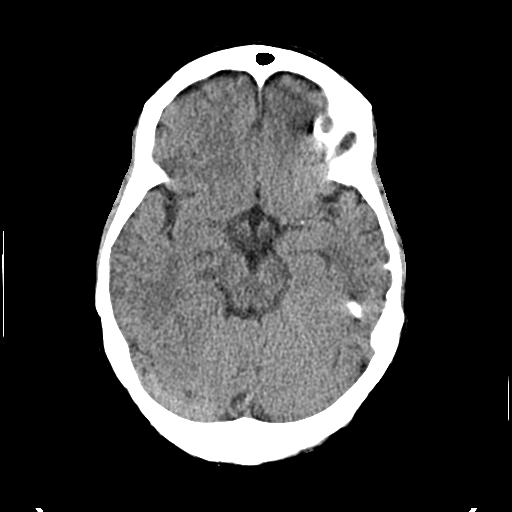
[im 15/36  brain]
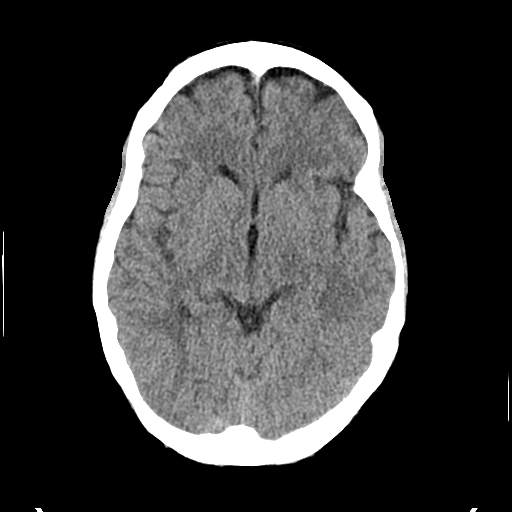
[im 17/36  brain]
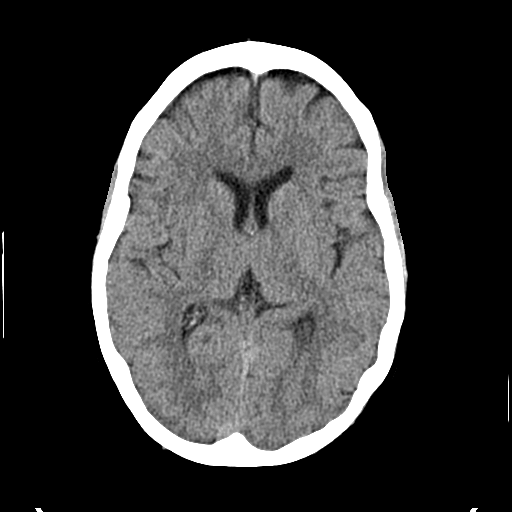
[im 19/36  brain]
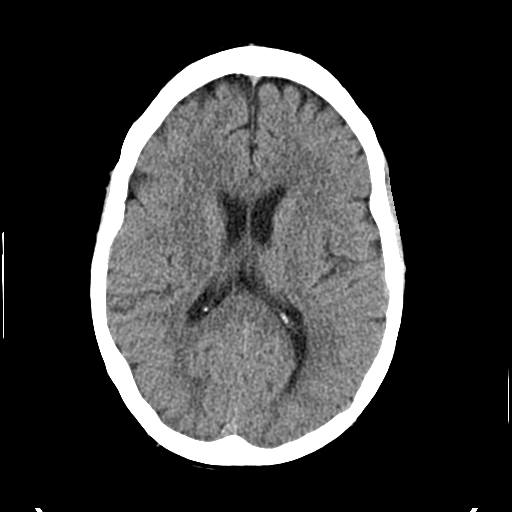
[im 19/36  bone]
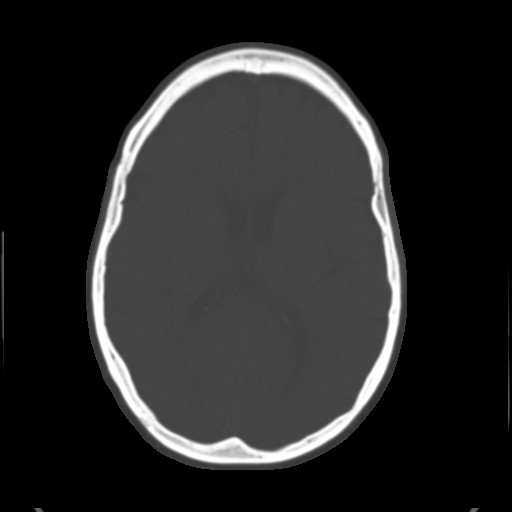
[im 21/36  brain]
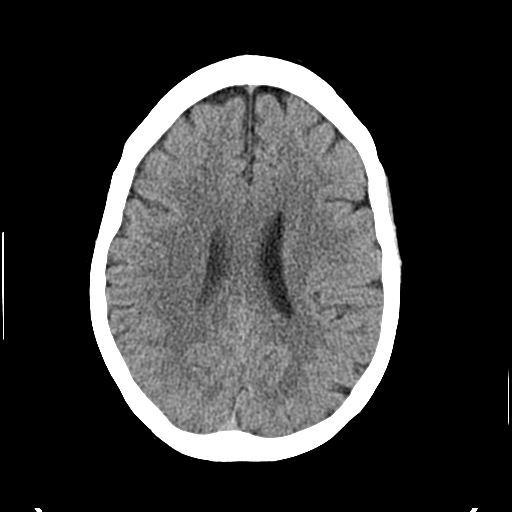
[im 23/36  brain]
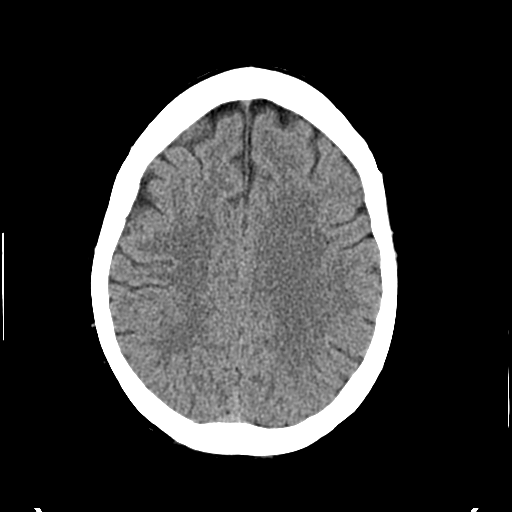
[im 26/36  brain]
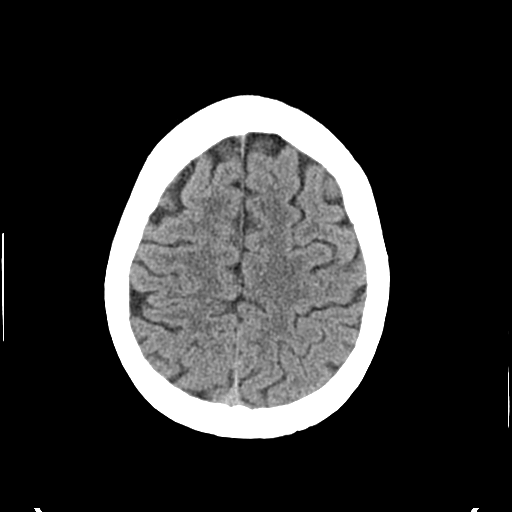
[im 27/36  brain]
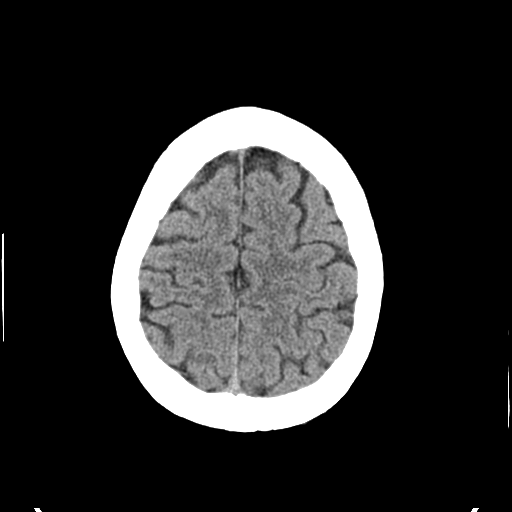
[im 27/36  bone]
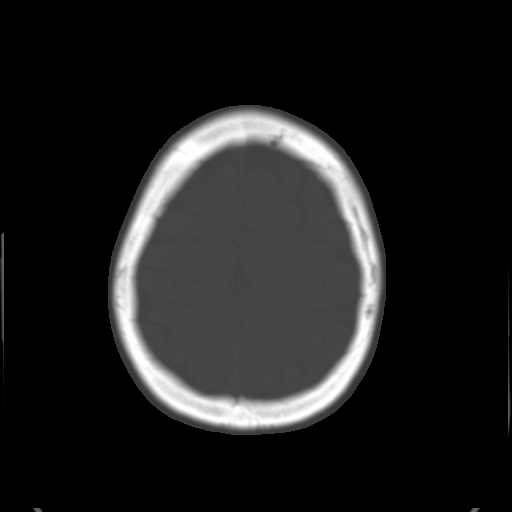
[im 29/36  brain]
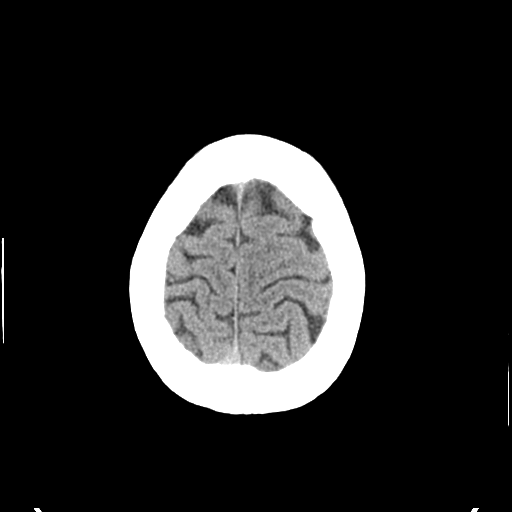
[im 32/36  brain]
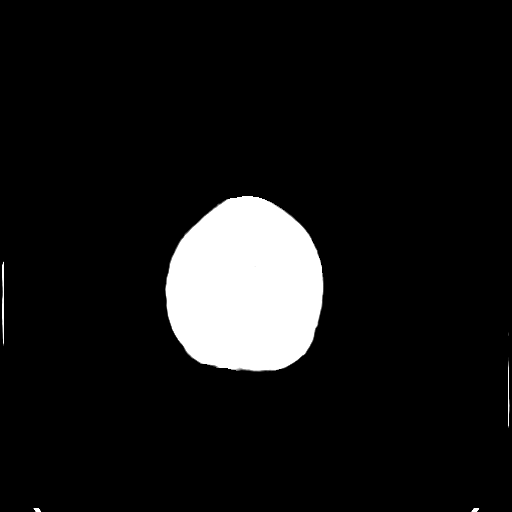
[im 34/36  brain]
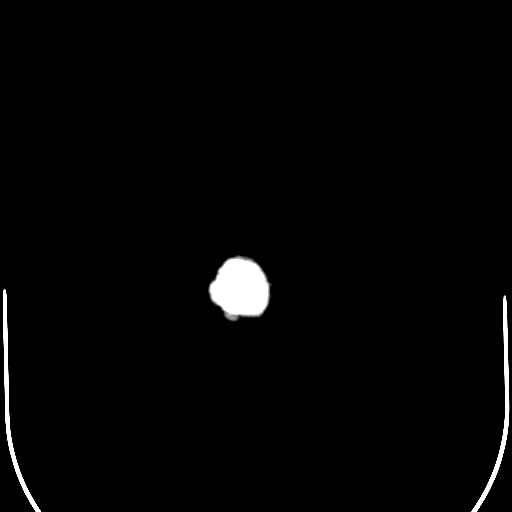

[16 of 30 positions shown; findings below may reference images not displayed]

FINDINGS: Ventricle size is normal.  Negative for acute infarct.
Negative for hemorrhage or mass.  No edema or midline shift.
Calvarium is normal.  Paranasal sinuses are clear.
IMPRESSION: Negative

## 2013-11-10 ENCOUNTER — Ambulatory Visit (HOSPITAL_BASED_OUTPATIENT_CLINIC_OR_DEPARTMENT_OTHER)
Admission: RE | Admit: 2013-11-10 | Discharge: 2013-11-10 | Disposition: A | Discharge status: Home / Self Care | Payer: BC Managed Care – PPO | Source: Ambulatory Visit | Attending: Neurology | Admitting: Neurology

## 2013-11-10 DIAGNOSIS — W19XXXA Unspecified fall, initial encounter: Secondary | ICD-10-CM | POA: Insufficient documentation

## 2013-11-10 DIAGNOSIS — S0990XA Unspecified injury of head, initial encounter: Secondary | ICD-10-CM | POA: Insufficient documentation

## 2013-11-10 DIAGNOSIS — R569 Unspecified convulsions: Secondary | ICD-10-CM | POA: Insufficient documentation

## 2013-11-10 MED ORDER — GADOBENATE DIMEGLUMINE 529 MG/ML IV SOLN: 13.0000 mL | Freq: Once | INTRAVENOUS | Status: AC | PRN

## 2013-11-12 ENCOUNTER — Telehealth: Payer: Self-pay | Admitting: *Deleted

## 2013-11-12 NOTE — Telephone Encounter (Signed)
Patient is aware of MRI results

## 2013-11-12 NOTE — Telephone Encounter (Signed)
Message copied by Claudie Revering on Mon Nov 12, 2013  8:03 AM ------      Message from: JAFFE, ADAM R      Created: Mon Nov 12, 2013  6:44 AM       Brain mri looks okay      ----- Message -----         From: Rad Results In Interface         Sent: 11/10/2013   6:41 PM           To: Dudley Major, DO                   ------

## 2013-11-13 ENCOUNTER — Telehealth: Payer: Self-pay | Admitting: Neurology

## 2013-11-13 NOTE — Telephone Encounter (Signed)
Pt called to cancel her 11/15/13 EMG appt. She stated that her daughter's college orientation was rescheduled to that day And she will call later to r/s the EMG.

## 2013-11-15 ENCOUNTER — Encounter: Payer: Self-pay | Admitting: Neurology

## 2013-11-22 ENCOUNTER — Telehealth: Payer: Self-pay | Admitting: Family

## 2013-11-22 NOTE — Telephone Encounter (Signed)
Refill- clonazepam  [facility] on [address]

## 2013-11-23 MED ORDER — CLONAZEPAM 0.5 MG PO TABS: 0.5000 mg | ORAL_TABLET | Freq: Two times a day (BID) | ORAL | Status: DC

## 2013-11-23 NOTE — Telephone Encounter (Signed)
Last Rx 10/26/13, #60. Pt last seen 10/27/14 and due for follow up in September. Rx printed and forwarded to Provider for signature.

## 2013-11-26 NOTE — Telephone Encounter (Signed)
Rx was faxed to pharmacy on 11/23/13.

## 2013-12-24 ENCOUNTER — Telehealth: Payer: Self-pay | Admitting: Family

## 2013-12-24 ENCOUNTER — Other Ambulatory Visit: Payer: Self-pay

## 2013-12-24 NOTE — Telephone Encounter (Signed)
Refill- clonazepam  [facility] on [address]

## 2013-12-24 NOTE — Telephone Encounter (Signed)
Lrd 11/24/2103 Lov 10/26/13 LDM

## 2013-12-24 NOTE — Telephone Encounter (Signed)
OK to send #60 tabs zero refills.  

## 2013-12-25 MED ORDER — CLONAZEPAM 0.5 MG PO TABS: 0.5000 mg | ORAL_TABLET | Freq: Two times a day (BID) | ORAL | Status: DC

## 2013-12-25 NOTE — Telephone Encounter (Signed)
See 12/24/13 refill note.

## 2013-12-25 NOTE — Telephone Encounter (Signed)
Received message from pt wanting to know if she would need follow up before medication was called in. Rx called to pharmacy voicemail as below. Left detailed message on pt's voicemail advising her that she will be due for 3 month follow up on 01/26/14 or after and to call back to arrange appt.

## 2014-01-11 ENCOUNTER — Encounter: Payer: Self-pay | Admitting: Family

## 2014-01-11 ENCOUNTER — Ambulatory Visit (INDEPENDENT_AMBULATORY_CARE_PROVIDER_SITE_OTHER): Payer: BC Managed Care – PPO | Admitting: Family

## 2014-01-11 VITALS — BP 100/70 | HR 67 | Temp 97.5°F | Resp 16 | Ht 65.75 in | Wt 136.4 lb

## 2014-01-11 DIAGNOSIS — F329 Major depressive disorder, single episode, unspecified: Secondary | ICD-10-CM

## 2014-01-11 DIAGNOSIS — F341 Dysthymic disorder: Secondary | ICD-10-CM

## 2014-01-11 DIAGNOSIS — F32A Depression, unspecified: Secondary | ICD-10-CM

## 2014-01-11 DIAGNOSIS — G43101 Migraine with aura, not intractable, with status migrainosus: Secondary | ICD-10-CM

## 2014-01-11 DIAGNOSIS — G43111 Migraine with aura, intractable, with status migrainosus: Secondary | ICD-10-CM

## 2014-01-11 DIAGNOSIS — F419 Anxiety disorder, unspecified: Secondary | ICD-10-CM

## 2014-01-11 MED ORDER — KETOROLAC TROMETHAMINE 60 MG/2ML IM SOLN
60.0000 mg | Freq: Once | INTRAMUSCULAR | Status: AC
  Administered 2014-01-11: 60 mg via INTRAMUSCULAR

## 2014-01-11 MED ORDER — CLONAZEPAM 0.5 MG PO TABS: 0.5000 mg | ORAL_TABLET | Freq: Two times a day (BID) | ORAL | Status: DC

## 2014-01-11 MED ORDER — ESCITALOPRAM OXALATE 10 MG PO TABS: 10.0000 mg | ORAL_TABLET | Freq: Every day | ORAL | Status: DC

## 2014-01-11 MED ORDER — PROMETHAZINE HCL 25 MG/ML IJ SOLN
25.0000 mg | Freq: Once | INTRAMUSCULAR | Status: AC
  Administered 2014-01-11: 25 mg via INTRAMUSCULAR

## 2014-01-11 NOTE — Progress Notes (Signed)
Subjective:    Patient ID: Shannon [name], female    DOB: [birth year], 53 y.o.   MRN: [id number]  HPI  Shannon [name] is a 53 yr old female who presents today for follow up.  1) anxiety/Depression-reports that she felt better on the 10 mg of lexapro but went down to 5mg  due to nausea. Nausea has resolved with the exception of nausea from her headache today.  Daughter went to college, she reports that she did well with her leaving. A little anxious in the middle of the night- about 2x a week she will wake up having a panic attack, wonders if she could take another klonipin if this occurs.  Crying has stopped.  She is lining up work for the fall to keep her busy.  2) Migraine- reports migraine which started yesterday. She reports that she is out of maxalt and cannot refill until 01/17/14. Reports that she has had improvement in the past with toradol and phenergan- has not had drowsiness wit phenergan in the past.  + nausea without vomiting, + photophobia, + phonophobia.     Review of Systems See HPI  Past Medical History  Diagnosis Date  . Anemia   . Depression   . Eating disorder     anorexia nervosa in her 20's  . Fainting spell   . Kidney stones     passed on their own  . Hx of laminectomy 1994    L4-5  . Stomach disorder     due to complications with cholecystectomy "almost died"  . Anxiety   . Migraine     History   Social History  . Marital Status: Married    Spouse Name: N/A    Number of Children: 3  . Years of Education: N/A   Occupational History  .     Social History Main Topics  . Smoking status: Never Smoker   . Smokeless tobacco: Never Used  . Alcohol Use: No  . Drug Use: Yes  . Sexual Activity: Yes    Partners: Male    Birth Control/ Protection: Surgical   Other Topics Concern  . Not on file   Social History Narrative   Regular exercise:  Yes (swim instructor)   Caffeine Use:  1 daily   Married 3 children ages 61 son, 47 son, and 63 daughter   Associates degree                Past Surgical History  Procedure Laterality Date  . Cholecystectomy  08/2009    reports history of gallbladder polyps, she reports stents in duct of lushca   . Appendectomy  1976  . Tonsillectomy  1982  . Laminectomy  1994    L4-5  . Tubal ligation    . Hysteroscopy  07/2011    Family History  Problem Relation Age of Onset  . Hypertension Mother   . Diabetes Father   . Hypertension Father   . Alcohol abuse Maternal Grandmother   . Cancer Paternal Grandfather     lung cancer  . Hashimoto's thyroiditis Sister     Allergies  Allergen Reactions  . Eszopiclone     Headaches  . Flexeril [Cyclobenzaprine Hcl] Other (See Comments)    Mood swings  . Gabapentin     sedation  . Morphine And Related Other (See Comments)    No relief  . Nsaids Other (See Comments)    GI bleed  . Paroxetine Nausea Only       .  Shellfish Allergy Other (See Comments)    skin & mouth tingle  . Tizanidine Hcl Other (See Comments)    Dizziness, mood swings    Current Outpatient Prescriptions on File Prior to Visit  Medication Sig Dispense Refill  . clonazePAM (KLONOPIN) 0.5 MG tablet Take 1 tablet (0.5 mg total) by mouth 2 (two) times daily.  60 tablet  0  . Melatonin 5 MG CAPS Take 1 capsule by mouth at bedtime.      . promethazine (PHENERGAN) 25 MG tablet Take 1 tablet (25 mg total) by mouth every 8 (eight) hours as needed for nausea or vomiting.  20 tablet  0  . rizatriptan (MAXALT-MLT) 10 MG disintegrating tablet Take 1 tablet (10 mg total) by mouth as needed for migraine. May repeat in 2 hours if needed  9 tablet  11   No current facility-administered medications on file prior to visit.    BP 100/70  Pulse 67  Temp(Src) 97.5 F (36.4 C) (Oral)  Resp 16  Ht 5' 5.75" (1.67 m)  Wt 136 lb 6.4 oz (61.871 kg)  BMI 22.18 kg/m2  SpO2 99%       Objective:   Physical Exam  Constitutional: She appears well-developed and well-nourished. No distress.    HENT:  Head: Normocephalic and atraumatic.  Eyes: Pupils are equal, round, and reactive to light.  Cardiovascular: Normal rate and regular rhythm.   No murmur heard. Pulmonary/Chest: Effort normal and breath sounds normal. No respiratory distress. She has no wheezes. She has no rales. She exhibits no tenderness.  Abdominal: Soft. Bowel sounds are normal. She exhibits no distension and no mass. There is no tenderness. There is no rebound and no guarding.  Musculoskeletal: She exhibits no edema.  Skin: Skin is warm and dry.  Psychiatric: She has a normal mood and affect. Her behavior is normal. Judgment and thought content normal.          Assessment & Plan:

## 2014-01-11 NOTE — Progress Notes (Signed)
Pre visit review using our clinic review tool, if applicable. No additional management support is needed unless otherwise documented below in the visit note. 

## 2014-01-11 NOTE — Assessment & Plan Note (Signed)
Pt given IM phenergan and toradol in office today. Advised her to cash pay for a few tabs or maxalt if necessary. Call if HA worsens or if HA does not improve.

## 2014-01-11 NOTE — Assessment & Plan Note (Signed)
Fair control.  Will attempt 10mg  of lexapro again now that she is tolerating the lower dose. OK to take klonopin for panic attack in middle of the night. A controlled substance contract was signed today.

## 2014-01-11 NOTE — Patient Instructions (Signed)
Increase lexapro to 10mg .  Follow up in 3 months.

## 2014-01-24 ENCOUNTER — Ambulatory Visit (INDEPENDENT_AMBULATORY_CARE_PROVIDER_SITE_OTHER): Payer: BC Managed Care – PPO | Admitting: Medical

## 2014-01-24 ENCOUNTER — Encounter: Payer: Self-pay | Admitting: Medical

## 2014-01-24 ENCOUNTER — Telehealth: Payer: Self-pay | Admitting: Medical

## 2014-01-24 ENCOUNTER — Telehealth: Payer: Self-pay | Admitting: Family

## 2014-01-24 VITALS — BP 110/76 | HR 74 | Temp 98.4°F | Ht 65.5 in | Wt 136.7 lb

## 2014-01-24 DIAGNOSIS — G43119 Migraine with aura, intractable, without status migrainosus: Secondary | ICD-10-CM

## 2014-01-24 MED ORDER — PROMETHAZINE HCL 25 MG/ML IJ SOLN
25.0000 mg | Freq: Once | INTRAMUSCULAR | Status: DC
Start: 2014-01-24 — End: 2014-03-05

## 2014-01-24 MED ORDER — KETOROLAC TROMETHAMINE 60 MG/2ML IM SOLN: 60.0000 mg | Freq: Once | INTRAMUSCULAR | Status: AC

## 2014-01-24 NOTE — Patient Instructions (Signed)
We are giving your toradol 60 mg im and phenergan 25 mg im in office today. Then resume your normal ha med regimen as neurologist has advised. If your HA does not improve or worsens then I recommend ED evaluation.  Follow up in 10-14 days pcp or prn.

## 2014-01-24 NOTE — Telephone Encounter (Signed)
I told pt to come back and check her 02 sat early am. Her o2 sat was 90%. Want to recheck. I need to talk with her. Front staff should call us when she comes in. Let me know when she is in.

## 2014-01-24 NOTE — Progress Notes (Signed)
Subjective:    Patient ID: Shannon [name], female    DOB: 1960/08/13, 53 y.o.   MRN: [id number]  HPI  Pt in with HA since this morning. Pt took migraine medication. Pt tried 1 tab maxalt in the am then another tablet 2 hour later. This did not help ha. She has ha all day.  She is sensitive to light and now sound sensitive. Slight pressure around rt eye but vision changes reported. Pt state MRI  about one month ago(MRI was negative and ordered by neurologist). Pt states severe HA but not worse in her life. She expresses doe not want to go to the ED. No gross motor or sensory function deficits reported.  Pt had near syncope 2 weeks ago. By pt description of last event does not sound like true syncope. She stood up real fast before that event. Pt last saw neurologist beginning of august. Pt states EEG was normal.  Pt has no chest pain today and no shortness of breath.  Pt husband is on her way so she can get toradol and phenergan today.    Review of Systems  Constitutional: Negative for fever, chills and fatigue.  Eyes: Negative for photophobia, pain, redness and visual disturbance.       But some rt eye periorbital pain.  Respiratory: Negative for cough, chest tightness, shortness of breath and wheezing.   Cardiovascular: Negative for chest pain and palpitations.  Gastrointestinal: Negative.   Endocrine: Negative for polydipsia, polyphagia and polyuria.  Musculoskeletal: Negative for arthralgias, back pain, joint swelling, neck pain and neck stiffness.       No pain reported in legs during interview.  Skin: Negative.   Neurological: Positive for headaches. Negative for dizziness, tremors, seizures, syncope, facial asymmetry, speech difficulty, weakness, light-headedness and numbness.       Light and sound sensitive HA.  Hematological: Negative for adenopathy. Does not bruise/bleed easily.  Psychiatric/Behavioral: Negative.        Objective:   Physical Exam  Constitutional: She  is oriented to person, place, and time. She appears well-developed and well-nourished. No distress.  HENT:  Head: Normocephalic and atraumatic.  Right Ear: External ear normal.  Left Ear: External ear normal.  Eyes: Conjunctivae and EOM are normal. Pupils are equal, round, and reactive to light.  Neck: Normal range of motion. Neck supple. No JVD present. No tracheal deviation present. No thyromegaly present.  No carotid bruits.  Cardiovascular: Normal rate, regular rhythm and normal heart sounds.  Exam reveals no gallop and no friction rub.   No murmur heard. Pulmonary/Chest: Effort normal and breath sounds normal. No stridor. No respiratory distress. She has no wheezes. She has no rales. She exhibits no tenderness.  Lymphadenopathy:    She has no cervical adenopathy.  Neurological: She is alert and oriented to person, place, and time. No cranial nerve deficit.  CN III-XII intact. Negative romberg, 5/5 equal and symmetric strength. No hand drift. Finger to nose intact. Symmetric smile. Heel to toe gait intact.Neg romberg.  Skin: She is not diaphoretic.  Psychiatric: She has a normal mood and affect. Her behavior is normal. Judgment and thought content normal.          Assessment & Plan:  Note that after pt left, I did review her chart and noticed her o2 sat was 90%. I called pt to ask her if she had any shortness of breath and she stated no shortness of breath at all. I asked her if she had any pain behind  her knees. She state maybe very faint behind rt knee. She states she would not noticed it until I asked. I explained to her that it is possible that staff did not keep o2 monitor on her finger long enough. But I do want to check tomorrow am. Also want to check if she has homans sign. If o2 sat truly low or positive homans signs may get lower ext doppler(explained what would want to rule out). Pt will com in tomorrow morning. i did advise pt that if at any point she does feel short of  breath, chest pain or leg pain that is obvious then I want her to go to ED for  evaluation. Pt expressed understanding. She will be in tomorrow. She will let front staff now I advised her to come in and I will talk with her.

## 2014-01-24 NOTE — Telephone Encounter (Signed)
Shannon [name] Self [phone] Lemmie Evens)  Rheannon called to check on her earlier call, I notice that Shannon [name] or Shannon [name] was not in today, so I talked with Shannon [name] and she advised me to ask Shannon [name] if he could work her in, he said yes so I scheduled an appointment for 4:45

## 2014-01-24 NOTE — Telephone Encounter (Signed)
Caller name: Naileah  Relation to pt: self Call back number: [phone]   Reason for call: pt migraine pill is not working, requesting to come in the office to receive the TRAMADOL SHOT

## 2014-01-24 NOTE — Assessment & Plan Note (Addendum)
Pt has migraine type features. Gave toradol im in office and pherngan im. Advised if ha worsens or perists then ED evaluation. Pt agreed. Her dad present as well. Continue current migraine med regimen as neurologist has recommended.

## 2014-01-25 NOTE — Telephone Encounter (Signed)
Patient in room. O2=100%

## 2014-01-25 NOTE — Progress Notes (Signed)
   Subjective:    Patient ID: Shannon [name], female    DOB: 06-15-1960, 53 y.o.   MRN: [id number]  HPI    Review of Systems     Objective:   Physical Exam        Assessment & Plan:  Pt did come back for check of her 02 sat. Today was 100% by lpn check. When I checked was 98%. She had no homans signs on exam. I believe yesterday reading was incorrect maybe o2 sat not left long enough on finger. Or maybe her Raynauds effected reading yesterday. Pt has no chest pain or sob. So yesterday reading appears not accurate.

## 2014-02-18 ENCOUNTER — Telehealth: Payer: Self-pay | Admitting: Family

## 2014-02-18 ENCOUNTER — Telehealth: Payer: Self-pay | Admitting: Neurology

## 2014-02-18 MED ORDER — CLONAZEPAM 0.5 MG PO TABS: 0.5000 mg | ORAL_TABLET | Freq: Three times a day (TID) | ORAL | Status: DC | PRN

## 2014-02-18 NOTE — Telephone Encounter (Signed)
Spoke with pt. She states that even though her quantity was increased to #70 last visit, directions still say twice a day and when she called for a refill her pharmacist told her she should still have 10 pills left. He states the quantity does not reflect the directions therefore pt is asking for directions to say three times a day or whatever is needed to reflect the quantity change.  Please advise.

## 2014-02-18 NOTE — Telephone Encounter (Signed)
OK to write TID PRN #70 on rx.

## 2014-02-18 NOTE — Telephone Encounter (Signed)
Rx printed and forwarded to Provider for signature.

## 2014-02-18 NOTE — Telephone Encounter (Signed)
Last rx given 01/11/14, #70 and directions are 1 tablet twice a day.  Please advise.

## 2014-02-18 NOTE — Telephone Encounter (Signed)
Caller name: Quintin Alto  Relation to pt: self  Call back number: [phone] [facility] [phone]   Reason for call: as per pt clonazePAM (KLONOPIN) 0.5 MG tablet needs to state 3x prn. Pt has 1 pill left

## 2014-02-18 NOTE — Telephone Encounter (Signed)
Ok to give #70 no refills.

## 2014-02-18 NOTE — Telephone Encounter (Signed)
Pt called requesting to speak to a nurse regarding her meds. C/B [phone]

## 2014-02-18 NOTE — Telephone Encounter (Signed)
Patient called stating she is having back leg and hip pain  Has been taking Motrin 800mg  daily without relief Please advise

## 2014-02-19 NOTE — Telephone Encounter (Signed)
Rx called to Vicente Males at [facility]. Notified pt.

## 2014-02-19 NOTE — Telephone Encounter (Signed)
Caller name: Reganne Relation to pt: Call back number: [phone] [facility]   Reason for call:Pt states calling to see if rx is ready since dose was changed. Pt wants to know if the rx is needed to be pick up or been sent to pharmacy. Pt is out of medication.  Please advise.

## 2014-03-05 ENCOUNTER — Ambulatory Visit (INDEPENDENT_AMBULATORY_CARE_PROVIDER_SITE_OTHER): Payer: BC Managed Care – PPO | Admitting: Family

## 2014-03-05 ENCOUNTER — Encounter: Payer: Self-pay | Admitting: Family

## 2014-03-05 VITALS — BP 100/50 | HR 64 | Temp 97.6°F | Resp 16 | Ht 65.75 in

## 2014-03-05 DIAGNOSIS — G43919 Migraine, unspecified, intractable, without status migrainosus: Secondary | ICD-10-CM

## 2014-03-05 MED ORDER — AMITRIPTYLINE HCL 25 MG PO TABS: 25.0000 mg | ORAL_TABLET | Freq: Every day | ORAL | Status: DC

## 2014-03-05 MED ORDER — KETOROLAC TROMETHAMINE 60 MG/2ML IM SOLN
60.0000 mg | Freq: Once | INTRAMUSCULAR | Status: AC
  Administered 2014-03-05: 60 mg via INTRAMUSCULAR

## 2014-03-05 MED ORDER — PROMETHAZINE HCL 25 MG/ML IJ SOLN
25.0000 mg | Freq: Once | INTRAMUSCULAR | Status: AC
  Administered 2014-03-05: 25 mg via INTRAMUSCULAR

## 2014-03-05 NOTE — Patient Instructions (Signed)
Start elavil at bedtime. You will be contacted about your referral to neurology. Call if headache worsens or if headache does not improve. Follow up in 2 months.

## 2014-03-05 NOTE — Assessment & Plan Note (Signed)
Uncontrolled. Add amytriptylline for migraine prophylaxis.  Refer to neurology. IM toradol and IM phenergan given today in office.  She is instructed to contact us if she has change in her depression status after starting elavil.

## 2014-03-05 NOTE — Progress Notes (Signed)
Subjective:    Patient ID: Shannon [name], female    DOB: 1961/03/31, 53 y.o.   MRN: [id number]  HPI   [name] is a 53 yr old female who presents today to discuss migraines. Reports that since Sunday 11/1 she has had HA.  Reports + associated photophobia.  Using maxalt without relief.  Has seen Dr. Tomi Likens in the past for back/neck pain.  Would like a referral to a new neurologist.  Seem to be worse right before and right after her period.     Review of Systems See HPI  Past Medical History  Diagnosis Date  . Anemia   . Depression   . Eating disorder     anorexia nervosa in her 20's  . Fainting spell   . Kidney stones     passed on their own  . Hx of laminectomy 1994    L4-5  . Stomach disorder     due to complications with cholecystectomy "almost died"  . Anxiety   . Migraine     History   Social History  . Marital Status: Married    Spouse Name: N/A    Number of Children: 3  . Years of Education: N/A   Occupational History  .     Social History Main Topics  . Smoking status: Never Smoker   . Smokeless tobacco: Never Used  . Alcohol Use: No  . Drug Use: Yes  . Sexual Activity:    Partners: Male    Patent examiner Protection: Surgical   Other Topics Concern  . Not on file   Social History Narrative   Regular exercise:  Yes (swim instructor)   Caffeine Use:  1 daily   Married 3 children ages 31 son, 67 son, and 60 daughter   Associates degree                Past Surgical History  Procedure Laterality Date  . Cholecystectomy  08/2009    reports history of gallbladder polyps, she reports stents in duct of lushca   . Appendectomy  1976  . Tonsillectomy  1982  . Laminectomy  1994    L4-5  . Tubal ligation    . Hysteroscopy  07/2011    Family History  Problem Relation Age of Onset  . Hypertension Mother   . Diabetes Father   . Hypertension Father   . Alcohol abuse Maternal Grandmother   . Cancer Paternal Grandfather     lung cancer  .  Hashimoto's thyroiditis Sister     Allergies  Allergen Reactions  . Eszopiclone     Headaches  . Flexeril [Cyclobenzaprine Hcl] Other (See Comments)    Mood swings  . Gabapentin     sedation  . Morphine And Related Other (See Comments)    No relief  . Nsaids Other (See Comments)    GI bleed  . Paroxetine Nausea Only       . Shellfish Allergy Other (See Comments)    skin & mouth tingle  . Tizanidine Hcl Other (See Comments)    Dizziness, mood swings    Current Outpatient Prescriptions on File Prior to Visit  Medication Sig Dispense Refill  . clonazePAM (KLONOPIN) 0.5 MG tablet Take 1 tablet (0.5 mg total) by mouth 3 (three) times daily as needed for anxiety. 70 tablet 0  . escitalopram (LEXAPRO) 10 MG tablet Take 1 tablet (10 mg total) by mouth daily. Pt takes 1/2 tablet by mouth daily 30 tablet 2  .  Melatonin 5 MG CAPS Take 1 capsule by mouth at bedtime.    . promethazine (PHENERGAN) 25 MG tablet Take 1 tablet (25 mg total) by mouth every 8 (eight) hours as needed for nausea or vomiting. 20 tablet 0  . rizatriptan (MAXALT-MLT) 10 MG disintegrating tablet Take 1 tablet (10 mg total) by mouth as needed for migraine. May repeat in 2 hours if needed 9 tablet 11   Current Facility-Administered Medications on File Prior to Visit  Medication Dose Route Frequency Provider Last Rate Last Dose  . promethazine (PHENERGAN) injection 25 mg  25 mg Intramuscular Once Edward M Saguier, PA-C        BP 100/50 mmHg  Pulse 64  Temp(Src) 97.6 F (36.4 C) (Oral)  Resp 16  Ht 5' 5.75" (1.67 m)  Wt   SpO2 99%       Objective:   Physical Exam  Constitutional: She is oriented to person, place, and time. She appears well-developed and well-nourished.  Appears uncomfortable in dark room  HENT:  Head: Normocephalic and atraumatic.  Eyes: EOM are normal. Pupils are equal, round, and reactive to light.  Cardiovascular: Normal rate and regular rhythm.   No murmur heard. Pulmonary/Chest:  Effort normal and breath sounds normal. No respiratory distress. She has no wheezes. She has no rales. She exhibits no tenderness.  Neurological: She is alert and oriented to person, place, and time.  Psychiatric: She has a normal mood and affect. Her behavior is normal. Judgment and thought content normal.          Assessment & Plan:

## 2014-03-05 NOTE — Progress Notes (Signed)
Pre visit review using our clinic review tool, if applicable. No additional management support is needed unless otherwise documented below in the visit note. 

## 2014-03-20 ENCOUNTER — Other Ambulatory Visit: Payer: Self-pay | Admitting: Family

## 2014-03-20 NOTE — Telephone Encounter (Signed)
Pt has f/u 04/19/14.  Please advise.  Medication name:  Name from pharmacy:  clonazePAM (KLONOPIN) 0.5 MG tablet CLONAZEPAM 0.5MG  TAB     Sig: TAKE 1 TABLET BY MOUTH THREE TIMES DAILY AS NEEDED ANXIETY    Dispense: 70 tablet   Start: 03/20/2014   Class: Normal    Notes to pharmacy: No refills available for controlled drug    Requested on: 02/19/2014    Originally ordered on: 09/14/2013 02/19/2014

## 2014-03-21 ENCOUNTER — Other Ambulatory Visit: Payer: Self-pay | Admitting: Family

## 2014-03-21 NOTE — Telephone Encounter (Signed)
Pt called to check the status of her clonazePAM (KLONOPIN) 0.5 MG tablet

## 2014-03-22 NOTE — Telephone Encounter (Signed)
Patient is requesting a callback from [place] when this has been sent. Best # [phone]

## 2014-03-22 NOTE — Telephone Encounter (Signed)
Rx printed and forwarded to [facility] for signature. 

## 2014-03-22 NOTE — Telephone Encounter (Signed)
Rx faxed to pharmacy  

## 2014-03-22 NOTE — Telephone Encounter (Signed)
Rx faxed to pharmacy. Pt notified.

## 2014-04-19 ENCOUNTER — Encounter: Payer: Self-pay | Admitting: Family

## 2014-04-19 ENCOUNTER — Ambulatory Visit (INDEPENDENT_AMBULATORY_CARE_PROVIDER_SITE_OTHER): Payer: BC Managed Care – PPO | Admitting: Family

## 2014-04-19 VITALS — BP 108/62 | HR 64 | Temp 97.8°F | Wt 138.5 lb

## 2014-04-19 DIAGNOSIS — F32A Depression, unspecified: Secondary | ICD-10-CM

## 2014-04-19 DIAGNOSIS — G8929 Other chronic pain: Secondary | ICD-10-CM

## 2014-04-19 DIAGNOSIS — G43119 Migraine with aura, intractable, without status migrainosus: Secondary | ICD-10-CM

## 2014-04-19 DIAGNOSIS — F329 Major depressive disorder, single episode, unspecified: Secondary | ICD-10-CM

## 2014-04-19 DIAGNOSIS — F418 Other specified anxiety disorders: Secondary | ICD-10-CM

## 2014-04-19 DIAGNOSIS — F419 Anxiety disorder, unspecified: Principal | ICD-10-CM

## 2014-04-19 MED ORDER — CLONAZEPAM 0.5 MG PO TABS: ORAL_TABLET | ORAL | Status: DC

## 2014-04-19 NOTE — Assessment & Plan Note (Signed)
Maintained on tramadol per pain management.

## 2014-04-19 NOTE — Progress Notes (Signed)
Subjective:    Patient ID: Shannon [name], female    DOB: [birth year], 53 y.o.   MRN: [id number]  HPI   Shannon [name] is a 53 yr old female who presents today for follow up.  1) Anxiety/Depression- reports depression is well controlled right now.  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  Dad has been sick/ had PPM placed.  This has mad her more anxious.  Takes klonopin bid.    2) Migraine- reports that since she started amytripiline she is no longer having headaches, did not see HA specialist since she is feeling better.   3) Chronic pain- saw pain management, was placed back on tramadol.  Will have some ESI injections.  Hopes to stop tramadol after injections.    Review of Systems    see HPI  Past Medical History  Diagnosis Date  . Anemia   . Depression   . Eating disorder     anorexia nervosa in her 20's  . Fainting spell   . Kidney stones     passed on their own  . Hx of laminectomy 1994    L4-5  . Stomach disorder     due to complications with cholecystectomy "almost died"  . Anxiety   . Migraine     History   Social History  . Marital Status: Married    Spouse Name: N/A    Number of Children: 3  . Years of Education: N/A   Occupational History  .     Social History Main Topics  . Smoking status: Never Smoker   . Smokeless tobacco: Never Used  . Alcohol Use: No  . Drug Use: Yes  . Sexual Activity:    Partners: Male    Patent examiner Protection: Surgical   Other Topics Concern  . Not on file   Social History Narrative   Regular exercise:  Yes (swim instructor)   Caffeine Use:  1 daily   Married 3 children ages 7 son, 91 son, and 91 daughter   Associates degree                Past Surgical History  Procedure Laterality Date  . Cholecystectomy  08/2009    reports history of gallbladder polyps, she reports stents in duct of lushca   . Appendectomy  1976  . Tonsillectomy  1982  . Laminectomy  1994    L4-5  . Tubal ligation    . Hysteroscopy   07/2011    Family History  Problem Relation Age of Onset  . Hypertension Mother   . Diabetes Father   . Hypertension Father   . Alcohol abuse Maternal Grandmother   . Cancer Paternal Grandfather     lung cancer  . Hashimoto's thyroiditis Sister     Allergies  Allergen Reactions  . Eszopiclone     Headaches  . Flexeril [Cyclobenzaprine Hcl] Other (See Comments)    Mood swings  . Gabapentin     sedation  . Morphine And Related Other (See Comments)    No relief  . Nsaids Other (See Comments)    GI bleed  . Paroxetine Nausea Only       . Shellfish Allergy Other (See Comments)    skin & mouth tingle  . Tizanidine Hcl Other (See Comments)    Dizziness, mood swings    Current Outpatient Prescriptions on File Prior to Visit  Medication Sig Dispense Refill  . amitriptyline (ELAVIL) 25 MG tablet Take 1 tablet (25 mg total) by  mouth at bedtime. 30 tablet 2  . clonazePAM (KLONOPIN) 0.5 MG tablet TAKE 1 TABLET BY MOUTH THREE TIMES DAILY AS NEEDED ANXIETY 70 tablet 0  . Melatonin 5 MG CAPS Take 1 capsule by mouth at bedtime.    . rizatriptan (MAXALT-MLT) 10 MG disintegrating tablet Take 1 tablet (10 mg total) by mouth as needed for migraine. May repeat in 2 hours if needed 9 tablet 11  . escitalopram (LEXAPRO) 10 MG tablet Take 1 tablet (10 mg total) by mouth daily. Pt takes 1/2 tablet by mouth daily (Patient not taking: Reported on 04/19/2014) 30 tablet 2  . promethazine (PHENERGAN) 25 MG tablet Take 1 tablet (25 mg total) by mouth every 8 (eight) hours as needed for nausea or vomiting. (Patient not taking: Reported on 04/19/2014) 20 tablet 0   No current facility-administered medications on file prior to visit.    BP 108/62 mmHg  Pulse 64  Temp(Src) 97.8 F (36.6 C) (Oral)  Wt 138 lb 8 oz (62.823 kg)  SpO2 98%    Objective:   Physical Exam  Constitutional: She is oriented to person, place, and time. She appears well-developed and well-nourished. No distress.    Cardiovascular: Normal rate and regular rhythm.   No murmur heard. Pulmonary/Chest: Effort normal and breath sounds normal. No respiratory distress. She has no wheezes. She has no rales. She exhibits no tenderness.  Neurological: She is alert and oriented to person, place, and time.  Skin: Skin is warm and dry.  Psychiatric: She has a normal mood and affect. Her behavior is normal. Judgment and thought content normal.          Assessment & Plan:

## 2014-04-19 NOTE — Progress Notes (Signed)
Pre visit review using our clinic review tool, if applicable. No additional management support is needed unless otherwise documented below in the visit note. 

## 2014-04-19 NOTE — Assessment & Plan Note (Signed)
Stable on current meds, continue same.  

## 2014-04-19 NOTE — Assessment & Plan Note (Signed)
Improved on amitriptyline, continue same.

## 2014-04-19 NOTE — Patient Instructions (Signed)
Please follow up in 6 months, sooner if problems/concerns.

## 2014-05-23 ENCOUNTER — Telehealth: Payer: Self-pay | Admitting: Family

## 2014-05-23 ENCOUNTER — Telehealth: Payer: Self-pay | Admitting: *Deleted

## 2014-05-23 MED ORDER — CLONAZEPAM 0.5 MG PO TABS: ORAL_TABLET | ORAL | Status: DC

## 2014-05-23 NOTE — Telephone Encounter (Signed)
Rx called to pharmacy voicemail. Pt notified. 

## 2014-05-23 NOTE — Telephone Encounter (Signed)
Caller name:Matsushima, Tyniesha Relation to JQ:BHAL Call back number:[phone] Pharmacy:[facility]  Reason for call: pt states she is completely out of the rx clonazePAM (KLONOPIN) 0.5 MG tablet, wanted me to make you aware because she states if possible she would like to get it today before the snow storm.

## 2014-05-23 NOTE — Telephone Encounter (Signed)
Ok

## 2014-05-23 NOTE — Telephone Encounter (Signed)
Per verbal from Provider, ok to call in. Refill left on pharmacy voicemail, #70 x no refills. Notified pt.

## 2014-05-23 NOTE — Telephone Encounter (Signed)
Received call from pt requesting refill of Clonazepam.  States pharmacy was supposed to have faxed [place] this morning. Do not have fax request at this time.  Last rx given 04/19/14, #70.  Please advise?

## 2014-06-03 ENCOUNTER — Other Ambulatory Visit: Payer: Self-pay | Admitting: Family

## 2014-06-18 NOTE — Telephone Encounter (Signed)
error 

## 2014-06-21 ENCOUNTER — Telehealth: Payer: Self-pay | Admitting: Family

## 2014-06-21 MED ORDER — CLONAZEPAM 0.5 MG PO TABS: ORAL_TABLET | ORAL | Status: DC

## 2014-06-21 NOTE — Telephone Encounter (Signed)
Last rx 05/23/14, #70.  Rx printed and forwarded to [facility] for signature.  Rx signed and faxed to pharmacy. Notified pt.

## 2014-06-21 NOTE — Telephone Encounter (Signed)
Caller name: Noreta Relation to pt: self Call back number: [phone] [facility] [address]  Reason for call:   Requesting a refill of clonazepam. She called pharmacy earlier in the week and will be out by the weekend.

## 2014-07-05 ENCOUNTER — Ambulatory Visit: Payer: Self-pay | Admitting: Family

## 2014-07-11 ENCOUNTER — Ambulatory Visit: Payer: BLUE CROSS/BLUE SHIELD | Admitting: Medical

## 2014-07-11 ENCOUNTER — Encounter: Payer: Self-pay | Admitting: Medical

## 2014-07-11 VITALS — BP 109/78 | HR 84 | Temp 98.1°F | Ht 65.75 in | Wt 130.0 lb

## 2014-07-11 DIAGNOSIS — G43009 Migraine without aura, not intractable, without status migrainosus: Secondary | ICD-10-CM

## 2014-07-11 MED ORDER — KETOROLAC TROMETHAMINE 60 MG/2ML IM SOLN
60.0000 mg | Freq: Once | INTRAMUSCULAR | Status: AC
  Administered 2014-07-11: 60 mg via INTRAMUSCULAR

## 2014-07-11 MED ORDER — PROMETHAZINE HCL 25 MG PO TABS: 25.0000 mg | ORAL_TABLET | Freq: Three times a day (TID) | ORAL | Status: DC | PRN

## 2014-07-11 NOTE — Assessment & Plan Note (Addendum)
Will give you toradol 60 im in office today. Rx of phenergan to use when you get home. If you ha is still high level despite this then would recommend ED evaluation.  If ha resolved but then comes back and not responding to your standard regimen then would consider neurology referral.  Follow up in 7 days any persisting symptoms or as needed.  Pt has used toradol before. Benefits and risk of med discussed.  Note not hx of allergic reaction. But she did have ulcer stomach with oral use of nsaids in the past. No current.

## 2014-07-11 NOTE — Progress Notes (Signed)
Pre visit review using our clinic review tool, if applicable. No additional management support is needed unless otherwise documented below in the visit note. 

## 2014-07-11 NOTE — Progress Notes (Signed)
Subjective:    Patient ID: Shannon [name], female    DOB: 02-14-1961, 54 y.o.   MRN: [id number]  HPI   Pt in with recent ha that started on Wednesday am. She has tried amitryptalene and imitrex. Lamonte Sakai is on left side mostly. Some nausea. Pt has some light sensitivity. HA is worse today than yesterday and Tuesday.   Pt ha level is 8/10 level ha.   Pt not reporting any gross motor or sensory function deficits.     Review of Systems  Constitutional: Negative for fever, chills and fatigue.  HENT: Negative for dental problem, facial swelling, nosebleeds, postnasal drip, rhinorrhea, sinus pressure, sore throat and trouble swallowing.   Respiratory: Negative for cough, shortness of breath and wheezing.   Cardiovascular: Negative for chest pain and palpitations.  Gastrointestinal: Positive for nausea. Negative for vomiting and abdominal pain.  Musculoskeletal: Positive for neck pain.  Skin: Positive for pallor.  Neurological: Positive for headaches. Negative for dizziness, tremors, seizures, syncope, facial asymmetry, weakness, light-headedness and numbness.   Past Medical History  Diagnosis Date  . Anemia   . Depression   . Eating disorder     anorexia nervosa in her 20's  . Fainting spell   . Kidney stones     passed on their own  . Hx of laminectomy 1994    L4-5  . Stomach disorder     due to complications with cholecystectomy "almost died"  . Anxiety   . Migraine     History   Social History  . Marital Status: Married    Spouse Name: N/A  . Number of Children: 3  . Years of Education: N/A   Occupational History  .     Social History Main Topics  . Smoking status: Never Smoker   . Smokeless tobacco: Never Used  . Alcohol Use: No  . Drug Use: Yes  . Sexual Activity:    Partners: Male    Patent examiner Protection: Surgical   Other Topics Concern  . Not on file   Social History Narrative   Regular exercise:  Yes (swim instructor)   Caffeine Use:  1 daily   Married 3 children ages 65 son, 62 son, and 61 daughter   Associates degree                Past Surgical History  Procedure Laterality Date  . Cholecystectomy  08/2009    reports history of gallbladder polyps, she reports stents in duct of lushca   . Appendectomy  1976  . Tonsillectomy  1982  . Laminectomy  1994    L4-5  . Tubal ligation    . Hysteroscopy  07/2011    Family History  Problem Relation Age of Onset  . Hypertension Mother   . Diabetes Father   . Hypertension Father   . Alcohol abuse Maternal Grandmother   . Cancer Paternal Grandfather     lung cancer  . Hashimoto's thyroiditis Sister     Allergies  Allergen Reactions  . Eszopiclone     Headaches  . Flexeril [Cyclobenzaprine Hcl] Other (See Comments)    Mood swings  . Gabapentin     sedation  . Morphine And Related Other (See Comments)    No relief  . Nsaids Other (See Comments)    GI bleed  . Paroxetine Nausea Only       . Shellfish Allergy Other (See Comments)    skin & mouth tingle  . Tizanidine Hcl Other (See Comments)  Dizziness, mood swings    Current Outpatient Prescriptions on File Prior to Visit  Medication Sig Dispense Refill  . amitriptyline (ELAVIL) 25 MG tablet TAKE 1 TABLET (25 MG TOTAL) BY MOUTH AT BEDTIME. 30 tablet 1  . clonazePAM (KLONOPIN) 0.5 MG tablet TAKE 1 TABLET BY MOUTH THREE TIMES DAILY AS NEEDED ANXIETY 70 tablet 0  . Melatonin 5 MG CAPS Take 1 capsule by mouth at bedtime.    . rizatriptan (MAXALT-MLT) 10 MG disintegrating tablet Take 1 tablet (10 mg total) by mouth as needed for migraine. May repeat in 2 hours if needed 9 tablet 11  . traMADol (ULTRAM) 50 MG tablet Take 50 mg by mouth every 6 (six) hours as needed.     No current facility-administered medications on file prior to visit.    BP 109/78 mmHg  Pulse 84  Temp(Src) 98.1 F (36.7 C) (Oral)  Ht 5' 5.75" (1.67 m)  Wt 130 lb (58.968 kg)  BMI 21.14 kg/m2  SpO2 100%  LMP 06/22/2014      Objective:     Physical Exam  General Mental Status- Alert. General Appearance- Not in acute distress.   Skin General: Color- Normal Color. Moisture- Normal Moisture. On exam of temporal area rt side no dilated vessels  Neck Carotid Arteries- Normal color. Moisture- Normal Moisture. No carotid bruits. No JVD. Rt trapezius tender to palpation.  Chest and Lung Exam Auscultation: Breath Sounds:-Normal. Even and unlabored.  Cardiovascular Auscultation:Rythm- Regular. Murmurs & Other Heart Sounds:Auscultation of the heart reveals- No Murmurs.    Neurologic Cranial Nerve exam:- CN III-XII intact(No nystagmus), symmetric smile. Drift Test:- No drift. Romberg Exam:- Negative.  Finger to Nose:- Normal/Intact Strength:- 5/5 equal and symmetric strength both upper and lower extremities.      Assessment & Plan:

## 2014-07-11 NOTE — Patient Instructions (Signed)
Migraine Will give you toradol 60 im in office today. Rx of phenergan to use when you get home. If you ha is still high level despite this then would recommend ED evaluation.  If ha resolved but then comes back and not responding to your standard regimen then would consider neurology referral.  Follow up in 7 days any persisting symptoms or as needed.

## 2014-07-16 ENCOUNTER — Other Ambulatory Visit: Payer: Self-pay | Admitting: Obstetrics and Gynecology

## 2014-07-17 LAB — CYTOLOGY - PAP

## 2014-07-22 ENCOUNTER — Telehealth: Payer: Self-pay | Admitting: Family

## 2014-07-22 MED ORDER — CLONAZEPAM 0.5 MG PO TABS: ORAL_TABLET | ORAL | Status: DC

## 2014-07-22 NOTE — Telephone Encounter (Signed)
Caller name:Merkel, Sundy Relation to NW:GNFA Call back number:[phone] Pharmacy:[facility]  Reason for call: pt states [facility] requested the rx   clonazePAM (KLONOPIN) 0.5 MG tablet     several times but has not gotten a response. Pt is completely out and needs rx .

## 2014-07-22 NOTE — Telephone Encounter (Signed)
Pt due for f/u in 10/2014. Last Clonazepam rx 06/21/14, #70. Rx printed and forwarded to Provider for signature.

## 2014-07-22 NOTE — Telephone Encounter (Signed)
Rx faxed to pharmacy and notified pt. 

## 2014-07-22 NOTE — Telephone Encounter (Signed)
Patient states that this is the third time that she has had problems getting this rx. Would like rx today.

## 2014-08-06 ENCOUNTER — Telehealth: Payer: Self-pay | Admitting: Family

## 2014-08-06 MED ORDER — AMITRIPTYLINE HCL 25 MG PO TABS: ORAL_TABLET | ORAL | Status: DC

## 2014-08-06 MED ORDER — PROMETHAZINE HCL 25 MG PO TABS: 25.0000 mg | ORAL_TABLET | Freq: Three times a day (TID) | ORAL | Status: DC | PRN

## 2014-08-06 NOTE — Telephone Encounter (Signed)
Refills sent, notified pt. 

## 2014-08-06 NOTE — Telephone Encounter (Signed)
Caller name: Kaileia Relation to pt: self Call back number: [phone] [facility] [address]  Reason for call:   Requesting amitriptyline  And promethazine refill

## 2014-08-22 ENCOUNTER — Telehealth: Payer: Self-pay | Admitting: Family

## 2014-08-22 NOTE — Telephone Encounter (Signed)
Caller name: Pepper Relation to pt: self Call back number: [phone] [facility] [address]  Reason for call:   Requesting clonazepam refill

## 2014-08-23 MED ORDER — CLONAZEPAM 0.5 MG PO TABS: ORAL_TABLET | ORAL | Status: DC

## 2014-08-23 NOTE — Telephone Encounter (Signed)
Faxed to pharmacy. Notified pt.

## 2014-08-23 NOTE — Telephone Encounter (Signed)
Rx printed and forwarded to Provider for signature.

## 2014-08-26 ENCOUNTER — Telehealth: Payer: Self-pay | Admitting: *Deleted

## 2014-08-26 NOTE — Telephone Encounter (Signed)
Received call from pt's therapist, Opal Sidles stating pt saw her today and has very high anxiety / stress. Son recently attempted suicide and therapist wants to know if we will increase pt's Elavil dose and see if that can help until pt sees [place] for f/u on 09/25/14.  Pt will continue to see therapist every other week as well.  Please advise?

## 2014-08-26 NOTE — Telephone Encounter (Signed)
Notified pt and scheduled f/u for tomorrow at 1:15pm.

## 2014-08-26 NOTE — Telephone Encounter (Signed)
I'd like to see her back in the office sooner to discuss meds/anxiety.

## 2014-08-27 ENCOUNTER — Ambulatory Visit (INDEPENDENT_AMBULATORY_CARE_PROVIDER_SITE_OTHER): Payer: BLUE CROSS/BLUE SHIELD | Admitting: Family

## 2014-08-27 ENCOUNTER — Encounter: Payer: Self-pay | Admitting: Family

## 2014-08-27 VITALS — BP 120/78 | HR 92 | Temp 98.3°F | Resp 16 | Ht 65.75 in | Wt 129.3 lb

## 2014-08-27 DIAGNOSIS — F411 Generalized anxiety disorder: Secondary | ICD-10-CM | POA: Diagnosis not present

## 2014-08-27 MED ORDER — AMITRIPTYLINE HCL 50 MG PO TABS: 50.0000 mg | ORAL_TABLET | Freq: Every day | ORAL | Status: DC

## 2014-08-27 NOTE — Assessment & Plan Note (Signed)
Deteriorated due to situational stress, will increase elavil. D/c tramadol due to drug interaction. Increase elavil from 25mg  to 50mg  to help with sleep and possibly help with anxiety. She is to continue klonopin. Does not wish to add a new medication at this time.  15 minutes spent with pt today.  >50% of this time was spent counseling pt on anxiety.

## 2014-08-27 NOTE — Patient Instructions (Signed)
Stop tramadol, increase elavil to 50mg .  Follow up in 1 month.

## 2014-08-27 NOTE — Progress Notes (Signed)
Pre visit review using our clinic review tool, if applicable. No additional management support is needed unless otherwise documented below in the visit note. 

## 2014-08-27 NOTE — Progress Notes (Signed)
Subjective:    Patient ID: Shannon [name], female    DOB: 1961-02-27, 54 y.o.   MRN: [id number]  HPI  Shannon [name] is a 54 yr old female who presents today to discuss anxiety.  Son was involuntarily committed for SI, hospitalized x 3 days.  She is using klonopin 3 times daily. Scared of his erratic behavior. Not sleeping well due to worry about her son. She has had a few panic attacks. Reports she has purposefully lost weight.   Wt Readings from Last 3 Encounters:  08/27/14 129 lb 4.8 oz (58.65 kg)  07/11/14 130 lb (58.968 kg)  04/19/14 138 lb 8 oz (62.823 kg)    Review of Systems See HPI  Past Medical History  Diagnosis Date  . Anemia   . Depression   . Eating disorder     anorexia nervosa in her 20's  . Fainting spell   . Kidney stones     passed on their own  . Hx of laminectomy 1994    L4-5  . Stomach disorder     due to complications with cholecystectomy "almost died"  . Anxiety   . Migraine     History   Social History  . Marital Status: Married    Spouse Name: N/A  . Number of Children: 3  . Years of Education: N/A   Occupational History  .     Social History Main Topics  . Smoking status: Never Smoker   . Smokeless tobacco: Never Used  . Alcohol Use: No  . Drug Use: Yes  . Sexual Activity:    Partners: Male    Patent examiner Protection: Surgical   Other Topics Concern  . Not on file   Social History Narrative   Regular exercise:  Yes (swim instructor)   Caffeine Use:  1 daily   Married 3 children ages 55 son, 29 son, and 63 daughter   Associates degree                Past Surgical History  Procedure Laterality Date  . Cholecystectomy  08/2009    reports history of gallbladder polyps, she reports stents in duct of lushca   . Appendectomy  1976  . Tonsillectomy  1982  . Laminectomy  1994    L4-5  . Tubal ligation    . Hysteroscopy  07/2011    Family History  Problem Relation Age of Onset  . Hypertension Mother   . Diabetes  Father   . Hypertension Father   . Alcohol abuse Maternal Grandmother   . Cancer Paternal Grandfather     lung cancer  . Hashimoto's thyroiditis Sister     Allergies  Allergen Reactions  . Eszopiclone     Headaches  . Flexeril [Cyclobenzaprine Hcl] Other (See Comments)    Mood swings  . Gabapentin     sedation  . Morphine And Related Other (See Comments)    No relief  . Nsaids Other (See Comments)    GI bleed  . Paroxetine Nausea Only       . Shellfish Allergy Other (See Comments)    skin & mouth tingle  . Tizanidine Hcl Other (See Comments)    Dizziness, mood swings    Current Outpatient Prescriptions on File Prior to Visit  Medication Sig Dispense Refill  . clonazePAM (KLONOPIN) 0.5 MG tablet TAKE 1 TABLET BY MOUTH THREE TIMES DAILY AS NEEDED ANXIETY 70 tablet 0  . Melatonin 5 MG CAPS Take 1 capsule by mouth  at bedtime.    . promethazine (PHENERGAN) 25 MG tablet Take 1 tablet (25 mg total) by mouth every 8 (eight) hours as needed for nausea or vomiting. 12 tablet 2  . rizatriptan (MAXALT-MLT) 10 MG disintegrating tablet Take 1 tablet (10 mg total) by mouth as needed for migraine. May repeat in 2 hours if needed 9 tablet 11   No current facility-administered medications on file prior to visit.    BP 120/78 mmHg  Pulse 92  Temp(Src) 98.3 F (36.8 C) (Oral)  Resp 16  Ht 5' 5.75" (1.67 m)  Wt 129 lb 4.8 oz (58.65 kg)  BMI 21.03 kg/m2  SpO2 99%  LMP 07/23/2014       Objective:   Physical Exam  Constitutional: She appears well-developed and well-nourished.  Psychiatric: She has a normal mood and affect. Her behavior is normal. Judgment and thought content normal.          Assessment & Plan:

## 2014-09-22 ENCOUNTER — Other Ambulatory Visit: Payer: Self-pay | Admitting: Family

## 2014-09-23 NOTE — Telephone Encounter (Signed)
Rx printed and forwarded to Provider for signature. [place] 01/2014, no UDS on file?. Pt has f/u 09/25/14.  Name from pharmacy:  clonazePAM (KLONOPIN) 0.5 MG tablet CLONAZEPAM 0.5MG  TAB     Sig: TAKE 1 TABLET BY MOUTH THREE TIMES DAILY AS NEEDED FOR ANXIETY    Dispense: 70 tablet   Start: 09/22/2014   Class: Normal    Notes to pharmacy: No refills available for controlled drug    Requested on: 08/23/2014    Originally ordered on: 09/14/2013 08/23/2014

## 2014-09-23 NOTE — Telephone Encounter (Signed)
Rx faxed to pharmacy  

## 2014-09-25 ENCOUNTER — Encounter: Payer: Self-pay | Admitting: Family

## 2014-09-25 ENCOUNTER — Ambulatory Visit (INDEPENDENT_AMBULATORY_CARE_PROVIDER_SITE_OTHER): Payer: BLUE CROSS/BLUE SHIELD | Admitting: Family

## 2014-09-25 VITALS — BP 104/70 | HR 70 | Temp 97.7°F | Resp 16 | Ht 65.75 in | Wt 127.0 lb

## 2014-09-25 DIAGNOSIS — F32A Depression, unspecified: Secondary | ICD-10-CM

## 2014-09-25 DIAGNOSIS — F418 Other specified anxiety disorders: Secondary | ICD-10-CM | POA: Diagnosis not present

## 2014-09-25 DIAGNOSIS — F329 Major depressive disorder, single episode, unspecified: Secondary | ICD-10-CM

## 2014-09-25 DIAGNOSIS — Z0000 Encounter for general adult medical examination without abnormal findings: Secondary | ICD-10-CM

## 2014-09-25 DIAGNOSIS — F419 Anxiety disorder, unspecified: Secondary | ICD-10-CM

## 2014-09-25 MED ORDER — AMITRIPTYLINE HCL 25 MG PO TABS: 25.0000 mg | ORAL_TABLET | Freq: Every day | ORAL | Status: DC

## 2014-09-25 NOTE — Assessment & Plan Note (Signed)
Improved. Decrease elavil to 25mg . We discussed risk of serotonin syndrome with use of elavil and tramadol.  She feels benefit outweighs risk, and wishes to continue.

## 2014-09-25 NOTE — Progress Notes (Signed)
Pre visit review using our clinic review tool, if applicable. No additional management support is needed unless otherwise documented below in the visit note.,  

## 2014-09-25 NOTE — Progress Notes (Addendum)
Subjective:    Patient ID: Shannon [name], female    DOB: 1960/11/10, 54 y.o.   MRN: [id number]  HPI  Shannon [name] is a 54 yr old female who presents today for follow up of anxiety and depression.  Last visit elavil was increased from 25mg  to 50mg  to help with sleep.  Reports feeling less anxious.  Pain is much worse since she came off the tramadol.  Reports sleeping better.  She continues to worry about her son's mental health, but reports that he is doing better overall and she is pleased.  She is sleeping better. Would like to decrease the elavil back to 25 and resume tramadol prn.    Review of Systems See HPI  Past Medical History  Diagnosis Date  . Anemia   . Depression   . Eating disorder     anorexia nervosa in her 20's  . Fainting spell   . Kidney stones     passed on their own  . Hx of laminectomy 1994    L4-5  . Stomach disorder     due to complications with cholecystectomy "almost died"  . Anxiety   . Migraine     History   Social History  . Marital Status: Married    Spouse Name: N/A  . Number of Children: 3  . Years of Education: N/A   Occupational History  .     Social History Main Topics  . Smoking status: Never Smoker   . Smokeless tobacco: Never Used  . Alcohol Use: No  . Drug Use: Yes  . Sexual Activity:    Partners: Male    Patent examiner Protection: Surgical   Other Topics Concern  . Not on file   Social History Narrative   Regular exercise:  Yes (swim instructor)   Caffeine Use:  1 daily   Married 3 children ages 30 son, 61 son, and 10 daughter   Associates degree                Past Surgical History  Procedure Laterality Date  . Cholecystectomy  08/2009    reports history of gallbladder polyps, she reports stents in duct of lushca   . Appendectomy  1976  . Tonsillectomy  1982  . Laminectomy  1994    L4-5  . Tubal ligation    . Hysteroscopy  07/2011    Family History  Problem Relation Age of Onset  . Hypertension  Mother   . Diabetes Father   . Hypertension Father   . Alcohol abuse Maternal Grandmother   . Cancer Paternal Grandfather     lung cancer  . Hashimoto's thyroiditis Sister     Allergies  Allergen Reactions  . Eszopiclone     Headaches  . Flexeril [Cyclobenzaprine Hcl] Other (See Comments)    Mood swings  . Gabapentin     sedation  . Morphine And Related Other (See Comments)    No relief  . Nsaids Other (See Comments)    GI bleed  . Paroxetine Nausea Only       . Shellfish Allergy Other (See Comments)    skin & mouth tingle  . Tizanidine Hcl Other (See Comments)    Dizziness, mood swings    Current Outpatient Prescriptions on File Prior to Visit  Medication Sig Dispense Refill  . clonazePAM (KLONOPIN) 0.5 MG tablet TAKE 1 TABLET BY MOUTH THREE TIMES DAILY AS NEEDED FOR ANXIETY 70 tablet 0  . Melatonin 5 MG CAPS Take 1  capsule by mouth at bedtime.    . metaxalone (SKELAXIN) 800 MG tablet Take 800 mg by mouth 2 (two) times daily as needed.  0  . promethazine (PHENERGAN) 25 MG tablet Take 1 tablet (25 mg total) by mouth every 8 (eight) hours as needed for nausea or vomiting. 12 tablet 2  . rizatriptan (MAXALT-MLT) 10 MG disintegrating tablet Take 1 tablet (10 mg total) by mouth as needed for migraine. May repeat in 2 hours if needed 9 tablet 11   No current facility-administered medications on file prior to visit.    BP 104/70 mmHg  Pulse 70  Temp(Src) 97.7 F (36.5 C) (Oral)  Resp 16  Ht 5' 5.75" (1.67 m)  Wt 127 lb (57.607 kg)  BMI 20.66 kg/m2  SpO2 99%  LMP 09/16/2014       Objective:   Physical Exam  Constitutional: She is oriented to person, place, and time. She appears well-developed and well-nourished. No distress.  Neurological: She is alert and oriented to person, place, and time.  Psychiatric: She has a normal mood and affect. Her behavior is normal. Judgment and thought content normal.          Assessment & Plan:

## 2014-09-25 NOTE — Patient Instructions (Signed)
Please schedule a follow up appointment in 6 months.   

## 2014-10-12 ENCOUNTER — Other Ambulatory Visit: Payer: Self-pay | Admitting: Family

## 2014-10-12 ENCOUNTER — Other Ambulatory Visit: Payer: Self-pay | Admitting: Neurology

## 2014-10-18 ENCOUNTER — Ambulatory Visit: Payer: Self-pay | Admitting: Family

## 2014-10-21 ENCOUNTER — Other Ambulatory Visit: Payer: Self-pay | Admitting: Family

## 2014-10-21 ENCOUNTER — Telehealth: Payer: Self-pay | Admitting: Family

## 2014-10-21 MED ORDER — CLONAZEPAM 0.5 MG PO TABS: 0.5000 mg | ORAL_TABLET | Freq: Three times a day (TID) | ORAL | Status: DC | PRN

## 2014-10-21 NOTE — Telephone Encounter (Signed)
Rx called to pharmacist and pt notified.

## 2014-10-21 NOTE — Telephone Encounter (Signed)
OK to send refill.  

## 2014-10-21 NOTE — Telephone Encounter (Signed)
Last Rx given 09/23/14, #70.  Please advise.

## 2014-10-21 NOTE — Telephone Encounter (Signed)
Caller name: Aleene Swanner Relationship to patient: self Can be reached: [phone] Pharmacy: [facility]  Reason for call: Pt calling for refill on clonazePAM (KLONOPIN) 0.5 MG tablet. She states she is out. She has taken a few extra bc she has been on 2 plane trips and her husband is having some heart issues.

## 2014-10-21 NOTE — Telephone Encounter (Signed)
I advised pt that she called in less than 3 hrs ago and that it has not been approved yet by provider.

## 2014-10-21 NOTE — Telephone Encounter (Signed)
Pt called and at [facility] pharmacy waiting for RX, states HT has refaxed request for clonazepam.

## 2014-10-25 ENCOUNTER — Encounter: Payer: Self-pay | Admitting: Physician Assistant

## 2014-10-25 ENCOUNTER — Ambulatory Visit (INDEPENDENT_AMBULATORY_CARE_PROVIDER_SITE_OTHER): Payer: BLUE CROSS/BLUE SHIELD | Admitting: Physician Assistant

## 2014-10-25 VITALS — BP 109/67 | HR 70 | Temp 97.9°F | Ht 65.75 in | Wt 126.2 lb

## 2014-10-25 DIAGNOSIS — L255 Unspecified contact dermatitis due to plants, except food: Secondary | ICD-10-CM | POA: Diagnosis not present

## 2014-10-25 MED ORDER — TRIAMCINOLONE ACETONIDE 0.1 % EX CREA: 1.0000 "application " | TOPICAL_CREAM | Freq: Two times a day (BID) | CUTANEOUS | Status: DC

## 2014-10-25 MED ORDER — METHYLPREDNISOLONE ACETATE 80 MG/ML IJ SUSP
80.0000 mg | Freq: Once | INTRAMUSCULAR | Status: AC
  Administered 2014-10-25: 80 mg via INTRAMUSCULAR

## 2014-10-25 MED ORDER — METHYLPREDNISOLONE 4 MG PO TBPK: ORAL_TABLET | ORAL | Status: DC

## 2014-10-25 NOTE — Assessment & Plan Note (Signed)
80 mg DepoMedrol given. Rx Medrol pack to begin tomorrow. Kenalog ointment for troublesome spots.  Supportive measures reviewed.  Follow-up if symptoms not resolving.

## 2014-10-25 NOTE — Progress Notes (Signed)
Patient presents to clinic today c/o pruritic rash of arms, legs and trunk starting this morning after being exposed to poison. ive last night when cleaning in yard. Denies shortness of breath or difficulty swallowing. Endorses being highly allergic to poison ivy.  Past Medical History  Diagnosis Date  . Anemia   . Depression   . Eating disorder     anorexia nervosa in her 20's  . Fainting spell   . Kidney stones     passed on their own  . Hx of laminectomy 1994    L4-5  . Stomach disorder     due to complications with cholecystectomy "almost died"  . Anxiety   . Migraine     Current Outpatient Prescriptions on File Prior to Visit  Medication Sig Dispense Refill  . amitriptyline (ELAVIL) 25 MG tablet Take 1 tablet (25 mg total) by mouth at bedtime. 30 tablet 5  . clonazePAM (KLONOPIN) 0.5 MG tablet Take 1 tablet (0.5 mg total) by mouth 3 (three) times daily as needed. for anxiety 70 tablet 0  . Melatonin 5 MG CAPS Take 1 capsule by mouth at bedtime.    . promethazine (PHENERGAN) 25 MG tablet TAKE 1 TABLET (25 MG TOTAL) BY MOUTH EVERY 8 (EIGHT) HOURS AS NEEDED FOR NAUSEA OR VOMITING. 12 tablet 2  . rizatriptan (MAXALT-MLT) 10 MG disintegrating tablet DISSOLVE 1 TABLET (10 MG TOTAL) IN  MOUTH AT ONSET OF MIGRAINE AS NEEDED -MAY REPEAT ONCE  IN 2 HOURS IF NEEDED 9 tablet 10  . traMADol (ULTRAM) 50 MG tablet May take 4 - 8 tablets daily (neuro)  0  . metaxalone (SKELAXIN) 800 MG tablet Take 800 mg by mouth 2 (two) times daily as needed.  0   No current facility-administered medications on file prior to visit.    Allergies  Allergen Reactions  . Eszopiclone     Headaches  . Flexeril [Cyclobenzaprine Hcl] Other (See Comments)    Mood swings  . Gabapentin     sedation  . Morphine And Related Other (See Comments)    No relief  . Nsaids Other (See Comments)    GI bleed  . Paroxetine Nausea Only       . Shellfish Allergy Other (See Comments)    skin & mouth tingle  .  Tizanidine Hcl Other (See Comments)    Dizziness, mood swings    Family History  Problem Relation Age of Onset  . Hypertension Mother   . Diabetes Father   . Hypertension Father   . Alcohol abuse Maternal Grandmother   . Cancer Paternal Grandfather     lung cancer  . Hashimoto's thyroiditis Sister     History   Social History  . Marital Status: Married    Spouse Name: N/A  . Number of Children: 3  . Years of Education: N/A   Occupational History  .     Social History Main Topics  . Smoking status: Never Smoker   . Smokeless tobacco: Never Used  . Alcohol Use: No  . Drug Use: Yes  . Sexual Activity:    Partners: Male    Patent examiner Protection: Surgical   Other Topics Concern  . None   Social History Narrative   Regular exercise:  Yes (Cabin crew)   Caffeine Use:  1 daily   Married 3 children ages 67 son, 61 son, and 83 daughter   Associates degree  Review of Systems - See HPI.  All other ROS are negative.  BP 109/67 mmHg  Pulse 70  Temp(Src) 97.9 F (36.6 C) (Oral)  Ht 5' 5.75" (1.67 m)  Wt 126 lb 3.2 oz (57.244 kg)  BMI 20.53 kg/m2  SpO2 100%  LMP 10/12/2014  Physical Exam  Constitutional: She is oriented to person, place, and time and well-developed, well-nourished, and in no distress.  HENT:  Head: Normocephalic and atraumatic.  Eyes: Conjunctivae are normal.  Cardiovascular: Normal rate, regular rhythm, normal heart sounds and intact distal pulses.   Pulmonary/Chest: Effort normal and breath sounds normal. No respiratory distress. She has no wheezes. She has no rales. She exhibits no tenderness.  Neurological: She is alert and oriented to person, place, and time.  Skin: Skin is warm and dry. Rash noted.    No results found for this or any previous visit (from the past 2160 hour(s)).  Assessment/Plan: Rhus dermatitis 80 mg DepoMedrol given. Rx Medrol pack to begin tomorrow. Kenalog ointment for troublesome spots.   Supportive measures reviewed.  Follow-up if symptoms not resolving.

## 2014-10-25 NOTE — Progress Notes (Signed)
Pre visit review using our clinic review tool, if applicable. No additional management support is needed unless otherwise documented below in the visit note. 

## 2014-10-25 NOTE — Patient Instructions (Signed)
Please start the steroid tablets tomorrow as you had an injection today. Use the Kenalog ointment to troublesome areas twice daily.  Sarna lotion and Benadryl at bedtime will also be beneficial. Follow-up if symptoms are not resolving.

## 2014-10-25 NOTE — Addendum Note (Signed)
Addended by: Harl Bowie on: 10/25/2014 03:41 PM   Modules accepted: Orders

## 2014-11-18 ENCOUNTER — Telehealth: Payer: Self-pay | Admitting: Family

## 2014-11-19 NOTE — Telephone Encounter (Signed)
Melissa-- I called in 1 month supply of Clonazepam for pt.  We do not have UDS on file. Do we need to collect one at next visit in November or next time she needs a refill?  Medication name:  Name from pharmacy:  clonazePAM (KLONOPIN) 0.5 MG tablet CLONAZEPAM 0.5MG  TAB     Sig: TAKE 1 TABLET BY MOUTH THREE TIMES DAILY AS NEEDED FOR ANXIETY    Dispense: 70 tablet   Refills: 0   Start: 11/18/2014   Class: Normal    Notes to pharmacy: No refills available for controlled drug    Requested on: 10/21/2014    Originally ordered on: 09/14/2013 10/21/2014

## 2014-11-19 NOTE — Telephone Encounter (Signed)
Refill was called to pharmacy voicemail.  Please advise re: next UDS or if UDS is needed?

## 2014-11-19 NOTE — Telephone Encounter (Signed)
Ok to send 70 tabs zero refills.

## 2014-11-19 NOTE — Telephone Encounter (Signed)
Patient needs refill of klonopin 0.5 mg tablet  To Engineer, drilling

## 2014-11-19 NOTE — Telephone Encounter (Signed)
Needs UDS pls

## 2014-11-20 NOTE — Telephone Encounter (Signed)
Left detailed message for pt that we will need to update CSC and obtain UDS when next clonopin refill is needed in August and to call if any questions.

## 2014-12-19 ENCOUNTER — Other Ambulatory Visit: Payer: Self-pay | Admitting: Family

## 2014-12-20 NOTE — Telephone Encounter (Signed)
Last Rx, #70 on 11/19/14.  Golden on file 01/2014. Have never completed UDS on pt. Rx printed and forwarded to PCP for signature.  Notified pt that Rx will be ready for pick up today and will need to leave UDS at time of pick up. Pt voices understanding.

## 2015-01-20 ENCOUNTER — Telehealth: Payer: Self-pay | Admitting: Family

## 2015-01-20 ENCOUNTER — Other Ambulatory Visit: Payer: Self-pay | Admitting: Family

## 2015-01-20 MED ORDER — CLONAZEPAM 0.5 MG PO TABS: ORAL_TABLET | ORAL | Status: DC

## 2015-01-20 NOTE — Telephone Encounter (Signed)
Last Rx 12/20/14.  UDS 12/24/14.  Rx printed and forwarded to PCP for signature.

## 2015-01-20 NOTE — Telephone Encounter (Signed)
Relation to [id number] Call back [place]: [place], [place] [address] [phone] (Phone) [phone] (Fax)         Reason for call:  Parent requesting a refill clonazePAM (KLONOPIN) 0.5 MG tablet,. Patient states she is completely out of medication and will be going out of town tomorrow inquiring if UDS is needed,.

## 2015-01-20 NOTE — Telephone Encounter (Signed)
Rx faxed to pharmacy and pt notified UDS not needed this time. 12/24/14 UDS received and fowarded to PCP for review.

## 2015-02-13 ENCOUNTER — Other Ambulatory Visit: Payer: Self-pay | Admitting: Family

## 2015-02-13 NOTE — Telephone Encounter (Signed)
Last filled:  10/14/14 Amt: 12, 2 Last OV: 09/25/14 Contract on file UDS: MODERATE risk  Please advise

## 2015-02-18 ENCOUNTER — Telehealth: Payer: Self-pay | Admitting: *Deleted

## 2015-02-18 MED ORDER — CLONAZEPAM 0.5 MG PO TABS: ORAL_TABLET | ORAL | Status: DC

## 2015-02-18 NOTE — Telephone Encounter (Signed)
Received fax from Kristopher Oppenheim requesting refill of clonazepam 0.5mg  tabs, three times daily.  Last Rx 01/20/15, #70. Next UDS due 03/25/15.  Rx printed and forwarded to PCP for signature.

## 2015-02-19 ENCOUNTER — Other Ambulatory Visit: Payer: Self-pay | Admitting: Obstetrics and Gynecology

## 2015-02-19 DIAGNOSIS — R928 Other abnormal and inconclusive findings on diagnostic imaging of breast: Secondary | ICD-10-CM

## 2015-02-19 NOTE — Telephone Encounter (Signed)
Rx faxed on 02/18/15 at 3pm.

## 2015-02-25 ENCOUNTER — Ambulatory Visit
Admission: RE | Admit: 2015-02-25 | Discharge: 2015-02-25 | Disposition: A | Discharge status: Home / Self Care | Payer: 59 | Source: Ambulatory Visit | Attending: Obstetrics and Gynecology | Admitting: Obstetrics and Gynecology

## 2015-02-25 DIAGNOSIS — R928 Other abnormal and inconclusive findings on diagnostic imaging of breast: Secondary | ICD-10-CM

## 2015-03-12 ENCOUNTER — Encounter: Payer: Self-pay | Admitting: Behavioral Health

## 2015-03-12 ENCOUNTER — Telehealth: Payer: Self-pay | Admitting: Behavioral Health

## 2015-03-12 NOTE — Telephone Encounter (Signed)
Pre-Visit Call completed with patient and chart updated.   Pre-Visit Info documented in Specialty Comments under SnapShot.    

## 2015-03-13 ENCOUNTER — Encounter: Payer: Self-pay | Admitting: Family

## 2015-03-13 ENCOUNTER — Ambulatory Visit (INDEPENDENT_AMBULATORY_CARE_PROVIDER_SITE_OTHER): Payer: 59 | Admitting: Family

## 2015-03-13 VITALS — BP 120/71 | HR 65 | Temp 98.1°F | Resp 16 | Ht 66.0 in | Wt 128.0 lb

## 2015-03-13 DIAGNOSIS — Z0000 Encounter for general adult medical examination without abnormal findings: Secondary | ICD-10-CM

## 2015-03-13 DIAGNOSIS — R634 Abnormal weight loss: Secondary | ICD-10-CM

## 2015-03-13 DIAGNOSIS — M545 Low back pain: Secondary | ICD-10-CM | POA: Diagnosis not present

## 2015-03-13 DIAGNOSIS — Z23 Encounter for immunization: Secondary | ICD-10-CM

## 2015-03-13 LAB — CBC WITH DIFFERENTIAL/PLATELET
Basophils Absolute: 0 10*3/uL (ref 0.0–0.1)
Basophils Relative: 0.5 % (ref 0.0–3.0)
EOS PCT: 2.8 % (ref 0.0–5.0)
Eosinophils Absolute: 0.1 10*3/uL (ref 0.0–0.7)
HCT: 42 % (ref 36.0–46.0)
Hemoglobin: 13.8 g/dL (ref 12.0–15.0)
LYMPHS ABS: 1.8 10*3/uL (ref 0.7–4.0)
Lymphocytes Relative: 34.5 % (ref 12.0–46.0)
MCHC: 32.9 g/dL (ref 30.0–36.0)
MCV: 94.5 fl (ref 78.0–100.0)
MONO ABS: 0.6 10*3/uL (ref 0.1–1.0)
Monocytes Relative: 10.3 % (ref 3.0–12.0)
NEUTROS ABS: 2.8 10*3/uL (ref 1.4–7.7)
NEUTROS PCT: 51.9 % (ref 43.0–77.0)
PLATELETS: 320 10*3/uL (ref 150.0–400.0)
RBC: 4.44 Mil/uL (ref 3.87–5.11)
RDW: 12.7 % (ref 11.5–15.5)
WBC: 5.4 10*3/uL (ref 4.0–10.5)

## 2015-03-13 LAB — HEPATIC FUNCTION PANEL
ALBUMIN: 4.1 g/dL (ref 3.5–5.2)
ALT: 11 U/L (ref 0–35)
AST: 16 U/L (ref 0–37)
Alkaline Phosphatase: 54 U/L (ref 39–117)
Bilirubin, Direct: 0.1 mg/dL (ref 0.0–0.3)
TOTAL PROTEIN: 6.8 g/dL (ref 6.0–8.3)
Total Bilirubin: 0.6 mg/dL (ref 0.2–1.2)

## 2015-03-13 LAB — LIPID PANEL
CHOL/HDL RATIO: 3
CHOLESTEROL: 171 mg/dL (ref 0–200)
HDL: 58.4 mg/dL (ref >39.00)
LDL CALC: 100 mg/dL — AB (ref 0–99)
NonHDL: 112.98
TRIGLYCERIDES: 66 mg/dL (ref 0.0–149.0)
VLDL: 13.2 mg/dL (ref 0.0–40.0)

## 2015-03-13 LAB — T3, FREE: T3, Free: 2.8 pg/mL (ref 2.3–4.2)

## 2015-03-13 LAB — URINALYSIS, ROUTINE W REFLEX MICROSCOPIC
Bilirubin Urine: NEGATIVE
Ketones, ur: NEGATIVE
NITRITE: NEGATIVE
PH: 8.5 — AB (ref 5.0–8.0)
SPECIFIC GRAVITY, URINE: 1.015 (ref 1.000–1.030)
Total Protein, Urine: NEGATIVE
Urine Glucose: NEGATIVE
Urobilinogen, UA: 0.2 (ref 0.0–1.0)

## 2015-03-13 LAB — BASIC METABOLIC PANEL
BUN: 9 mg/dL (ref 6–23)
CHLORIDE: 103 meq/L (ref 96–112)
CO2: 31 mEq/L (ref 19–32)
Calcium: 9.2 mg/dL (ref 8.4–10.5)
Creatinine, Ser: 0.8 mg/dL (ref 0.40–1.20)
GFR: 79.55 mL/min (ref >60.00)
GLUCOSE: 91 mg/dL (ref 70–99)
POTASSIUM: 4.2 meq/L (ref 3.5–5.1)
Sodium: 139 mEq/L (ref 135–145)

## 2015-03-13 LAB — T4, FREE: Free T4: 0.82 ng/dL (ref 0.60–1.60)

## 2015-03-13 LAB — TSH: TSH: 0.73 u[IU]/mL (ref 0.35–4.50)

## 2015-03-13 NOTE — Progress Notes (Signed)
Subjective:    Patient ID: Shannon [name], female    DOB: 12-01-1960, 54 y.o.   MRN: [id number]  HPI  Patient presents today for complete physical.  Immunizations:  Due for Tdap, declines flu shot Diet: not eating healthy. Could eat a lot better.  Hx of anorexia.   Exercise: walking/swimming Colonoscopy: 2014-  Dexa: 2004- still getting periods, no hot flashes Pap Smear: 3/16- normal Mammogram: 2 weeks ag   Wt Readings from Last 3 Encounters:  03/13/15 128 lb (58.06 kg)  10/25/14 126 lb 3.2 oz (57.244 kg)  09/25/14 127 lb (57.607 kg)    Review of Systems  Constitutional: Negative for unexpected weight change.  HENT: Negative for rhinorrhea.        Notes "flashers" on occasion and she has spoken to her eye doctor about this  Eyes: Negative for visual disturbance.  Respiratory: Negative for cough.   Cardiovascular: Negative for leg swelling.       Occasional  Palpitations prior to period  Gastrointestinal: Positive for constipation.  Genitourinary: Negative for dysuria and frequency.  Musculoskeletal: Positive for back pain. Negative for myalgias and arthralgias.  Skin: Negative for rash.       Hair loss, began in august  Neurological:       HA's are improved  Hematological: Negative for adenopathy.  Psychiatric/Behavioral:       Reports depression is well controlled.    Past Medical History  Diagnosis Date  . Anemia   . Depression   . Eating disorder     anorexia nervosa in her 20's  . Fainting spell   . Kidney stones     passed on their own  . Hx of laminectomy 1994    L4-5  . Stomach disorder     due to complications with cholecystectomy "almost died"  . Anxiety   . Migraine     Social History   Social History  . Marital Status: Married    Spouse Name: N/A  . Number of Children: 3  . Years of Education: N/A   Occupational History  .     Social History Main Topics  . Smoking status: Never Smoker   . Smokeless tobacco: Never Used  . Alcohol  Use: No  . Drug Use: Yes  . Sexual Activity:    Partners: Male    Patent examiner Protection: Surgical   Other Topics Concern  . Not on file   Social History Narrative   Regular exercise:  Yes (swim instructor)   Caffeine Use:  1 daily   Married 3 children ages 60 son, 79 son, and 18 daughter   Associates degree                Past Surgical History  Procedure Laterality Date  . Cholecystectomy  08/2009    reports history of gallbladder polyps, she reports stents in duct of lushca   . Appendectomy  1976  . Tonsillectomy  1982  . Laminectomy  1994    L4-5  . Tubal ligation    . Hysteroscopy  07/2011    Family History  Problem Relation Age of Onset  . Hypertension Mother   . Diabetes Father   . Hypertension Father   . Hyperthyroidism Father   . Alcohol abuse Maternal Grandmother   . Cancer Paternal Grandfather     lung cancer  . Hashimoto's thyroiditis Sister   . Hyperthyroidism Sister   . Hypothyroidism Other     Allergies  Allergen Reactions  .  Eszopiclone     Headaches  . Flexeril [Cyclobenzaprine Hcl] Other (See Comments)    Mood swings  . Gabapentin     sedation  . Morphine And Related Other (See Comments)    No relief  . Nsaids Other (See Comments)    GI bleed  . Paroxetine Nausea Only       . Shellfish Allergy Other (See Comments)    skin & mouth tingle  . Tizanidine Hcl Other (See Comments)    Dizziness, mood swings    Current Outpatient Prescriptions on File Prior to Visit  Medication Sig Dispense Refill  . clonazePAM (KLONOPIN) 0.5 MG tablet TAKE 1 TABLET THREE TIMES DAILY AS NEEDED FOR ANXIETY 70 tablet 0  . traMADol (ULTRAM) 50 MG tablet May take 4 - 8 tablets daily (neuro)  0   No current facility-administered medications on file prior to visit.    BP 120/71 mmHg  Pulse 65  Temp(Src) 98.1 F (36.7 C) (Oral)  Resp 16  Ht _0  (1.676 m)  Wt 128 lb (58.06 kg)  BMI 20.67 kg/m2  SpO2 100%  LMP 03/10/2015        Objective:     Physical Exam Physical Exam  Constitutional: She is oriented to person, place, and time. She appears well-developed and well-nourished. No distress.  HENT:  Head: Normocephalic and atraumatic.  Right Ear: Tympanic membrane and ear canal normal.  Left Ear: Tympanic membrane and ear canal normal.  Mouth/Throat: Oropharynx is clear and moist.  Eyes: Pupils are equal, round, and reactive to light. No scleral icterus.  Neck: Normal range of motion. No thyromegaly present.  Cardiovascular: Normal rate and regular rhythm.   No murmur heard. Pulmonary/Chest: Effort normal and breath sounds normal. No respiratory distress. He has no wheezes. She has no rales. She exhibits no tenderness.  Abdominal: Soft. Bowel sounds are normal. He exhibits no distension and no mass. There is no tenderness. There is no rebound and no guarding.  Musculoskeletal: She exhibits no edema.  Lymphadenopathy:    She has no cervical adenopathy.  Neurological: She is alert and oriented to person, place, and time. She has normal reflexes. She exhibits normal muscle tone. Coordination normal.  Skin: Skin is warm and dry.  Psychiatric: She has a normal mood and affect. Her behavior is normal. Judgment and thought content normal.  Breasts: Examined lying Right: Without masses, retractions, discharge or axillary adenopathy.  Left: Without masses, retractions, discharge or axillary adenopathy.  Pelvic: deferred to gyn      Assessment & Plan:          Assessment & Plan:  Weight loss- will obtain TFT's.  Encouraged healthy diet.  Pt has no family hx of breast CA in first degree relatives, denies [place] jewish decent.  Discussed that brca 1 and 2 mutuation is unlikely. She wishes to hold off on testing and continue annual screening mammograms  EKG tracing is personally reviewed.  EKG notes sinus NSR.  No acute changes. Appears unchanged when compared to previous

## 2015-03-13 NOTE — Patient Instructions (Signed)
Please complete lab work prior to leaving. You will be contacted about your referral to pain management. Follow up in 6 months.

## 2015-03-13 NOTE — Progress Notes (Signed)
Pre visit review using our clinic review tool, if applicable. No additional management support is needed unless otherwise documented below in the visit note. 

## 2015-03-13 NOTE — Assessment & Plan Note (Signed)
Discussed healthy diet, exercise, obtain routine labs.  Pap, mammo colo, up to date. Tdap today

## 2015-03-19 ENCOUNTER — Telehealth: Payer: Self-pay | Admitting: Family

## 2015-03-19 MED ORDER — CLONAZEPAM 0.5 MG PO TABS: ORAL_TABLET | ORAL | Status: DC

## 2015-03-19 NOTE — Telephone Encounter (Signed)
Rx faxed to pharmacy  

## 2015-03-19 NOTE — Telephone Encounter (Signed)
Relation to [id number] Call back [place], [address]  Reason for call:   Patient requesting a refill clonazePAM (KLONOPIN) 0.5 MG tablet

## 2015-03-19 NOTE — Telephone Encounter (Signed)
Last Rx 02/18/15. Next UDS due 03/25/15. Rx printed and forwarded to PCP for signature.

## 2015-03-31 ENCOUNTER — Ambulatory Visit: Payer: Self-pay | Admitting: Family

## 2015-04-17 ENCOUNTER — Telehealth: Payer: Self-pay | Admitting: Family

## 2015-04-17 NOTE — Telephone Encounter (Signed)
Caller name: Self  Can be reached: [phone]  Pharmacy:  [place], [place] [address] [phone] (Phone) [phone] (Fax)         Reason for call:Request refill on clonazePAM (KLONOPIN) 0.5 MG tablet [id number]

## 2015-04-18 MED ORDER — CLONAZEPAM 0.5 MG PO TABS: ORAL_TABLET | ORAL | Status: DC

## 2015-04-18 NOTE — Telephone Encounter (Signed)
Pt due for UDS now. Last Rx printed 03/19/15.  Rx printed and forwarded to PCP for signature. Left detailed message on pt's cell# that Rx will be ready for pick up today after 2pm and that we will need to collect UDS at time of Rx pick up.

## 2015-05-02 ENCOUNTER — Telehealth: Payer: Self-pay | Admitting: Family

## 2015-05-02 NOTE — Telephone Encounter (Signed)
error:[id number] ° °

## 2015-05-06 ENCOUNTER — Telehealth: Payer: Self-pay | Admitting: *Deleted

## 2015-05-06 NOTE — Telephone Encounter (Signed)
Attempted to reach patient and left message for pt to return my call

## 2015-05-06 NOTE — Telephone Encounter (Signed)
-----   Message from Quintin Alto sent at 05/02/2015  8:54 AM EST ----- Regarding: NON URGENT Contact: [phone] NON-URGENT: Patient called and wanted to leave a message for Gilmore Laroche to call her when she is back in the office next week. States she wants to be referred to another Pain Specialist.

## 2015-05-07 NOTE — Telephone Encounter (Signed)
Spoke with pt, she states she was told that Dr Letta Pate is booking out 4 months and is hoping to get in with him by March. She is going to see her current pain management group this month and until she can get in with new group. She still wants to see a new group as she feels her current group is trying to force treatments on her that she is not ready for.  Also states that her headaches have returned and she is having them every day. Has been undergoing accupuncture 1-2 x a week and headaches are staying chronic, "not going to full blown migraines". Pt is requesting to go back on amitriptyline for headaches and her mood.  Please advise?

## 2015-05-08 NOTE — Telephone Encounter (Signed)
I would not recommend adding elavil with tramadol due to risk of siezures as drug interaction.  We could add propranolol (beta blocker) for migraine prevention if she wants.

## 2015-05-08 NOTE — Telephone Encounter (Signed)
Left a message for call back.  

## 2015-05-08 NOTE — Telephone Encounter (Signed)
Pt states she was on elavil and tramadol before, but was on a "low,low dose of elavil."  She says that she's having chronic headaches and not migraines.  She does not want to start a new medication at this time.  She says that she will give it a couple of weeks and then call back.

## 2015-05-16 ENCOUNTER — Telehealth: Payer: Self-pay | Admitting: Family

## 2015-05-16 MED ORDER — CLONAZEPAM 0.5 MG PO TABS: ORAL_TABLET | ORAL | Status: DC

## 2015-05-16 NOTE — Telephone Encounter (Signed)
Caller name: Saachi   Relationship to patient: Self   Can be reached:[phone]  Pharmacy: [place] [address]  Reason for call: pt is requesting a refill on her clonazePAM Rx.

## 2015-05-16 NOTE — Telephone Encounter (Signed)
Last Rx:  04/18/15 Last UDS 04/2015, next due 07/2015 F/u:  09/2015  Rx printed and forwarded to PCP for signature.

## 2015-05-16 NOTE — Telephone Encounter (Signed)
Rx faxed to pharmacy. Left detailed message on pt's cell# re: Rx completion.

## 2015-05-29 ENCOUNTER — Telehealth: Payer: Self-pay | Admitting: Family

## 2015-05-29 NOTE — Telephone Encounter (Signed)
Caller name: Kellijo  Relationship to patient: Self  Can be reached: [phone]  Reason for call: Pt says that she need a referral to a neurosurgeon for a second opinion. She says that she fell about a week ago. She says that she would like to go see Dr. Ellene Route.   Please advise further.    Thanks.

## 2015-05-31 NOTE — Telephone Encounter (Signed)
Please ask patient if she is requesting this referral due to neck pain or back pain or both.

## 2015-06-02 NOTE — Telephone Encounter (Signed)
Spoke with pt. She states she fell 2 weeks ago at her daughter's dorm and injured her hip and back. Pt states she got in to see orthopedics after calling us. She has 3 discs that "are gone" and ortho recommended that she see neurosurgeon. States she has talked with Ortho and they are handling referral and getting xrays / films to Dr Ellene Route for review / appt. Nothing further needed from [place] at this time.

## 2015-06-10 ENCOUNTER — Telehealth: Payer: Self-pay | Admitting: Family

## 2015-06-10 NOTE — Telephone Encounter (Signed)
LVM inquiring if patient received flu shot  °

## 2015-06-13 ENCOUNTER — Telehealth: Payer: Self-pay | Admitting: Family

## 2015-06-13 MED ORDER — CLONAZEPAM 0.5 MG PO TABS: ORAL_TABLET | ORAL | Status: DC

## 2015-06-13 NOTE — Telephone Encounter (Signed)
Rx faxed to pharmacy  

## 2015-06-13 NOTE — Telephone Encounter (Signed)
Pharmacy: [place], [place] [address]  Reason for call: Pt called for refill on klonopin. She has 4 left and takes up to 3/day.

## 2015-06-13 NOTE — Telephone Encounter (Signed)
Last Rx 05/16/15, #70. Next UDS due 07/2015. Next f/u with PCP 09/2015.  Rx printed and forwarded to PCP for signature.

## 2015-06-13 NOTE — Telephone Encounter (Signed)
Notified pt. 

## 2015-06-19 ENCOUNTER — Encounter: Payer: Self-pay | Admitting: Physical Medicine & Rehabilitation

## 2015-06-19 ENCOUNTER — Encounter: Payer: 59 | Attending: Physical Medicine & Rehabilitation

## 2015-06-19 ENCOUNTER — Ambulatory Visit (HOSPITAL_BASED_OUTPATIENT_CLINIC_OR_DEPARTMENT_OTHER): Payer: 59 | Admitting: Physical Medicine & Rehabilitation

## 2015-06-19 VITALS — BP 122/61 | HR 77 | Resp 14

## 2015-06-19 DIAGNOSIS — M5136 Other intervertebral disc degeneration, lumbar region: Secondary | ICD-10-CM | POA: Diagnosis not present

## 2015-06-19 DIAGNOSIS — G8929 Other chronic pain: Secondary | ICD-10-CM | POA: Diagnosis not present

## 2015-06-19 DIAGNOSIS — M542 Cervicalgia: Secondary | ICD-10-CM | POA: Diagnosis not present

## 2015-06-19 DIAGNOSIS — Z5181 Encounter for therapeutic drug level monitoring: Secondary | ICD-10-CM | POA: Insufficient documentation

## 2015-06-19 DIAGNOSIS — M5416 Radiculopathy, lumbar region: Secondary | ICD-10-CM

## 2015-06-19 DIAGNOSIS — M961 Postlaminectomy syndrome, not elsewhere classified: Secondary | ICD-10-CM | POA: Insufficient documentation

## 2015-06-19 DIAGNOSIS — Z79899 Other long term (current) drug therapy: Secondary | ICD-10-CM

## 2015-06-19 DIAGNOSIS — S336XXA Sprain of sacroiliac joint, initial encounter: Secondary | ICD-10-CM | POA: Diagnosis not present

## 2015-06-19 DIAGNOSIS — M533 Sacrococcygeal disorders, not elsewhere classified: Secondary | ICD-10-CM

## 2015-06-19 DIAGNOSIS — G894 Chronic pain syndrome: Secondary | ICD-10-CM | POA: Insufficient documentation

## 2015-06-19 HISTORY — DX: Other chronic pain: G89.29

## 2015-06-19 HISTORY — DX: Sacrococcygeal disorders, not elsewhere classified: M53.3

## 2015-06-19 HISTORY — DX: Postlaminectomy syndrome, not elsewhere classified: M96.1

## 2015-06-19 HISTORY — DX: Radiculopathy, lumbar region: M54.16

## 2015-06-19 MED ORDER — TRAMADOL HCL 50 MG PO TABS: 100.0000 mg | ORAL_TABLET | Freq: Three times a day (TID) | ORAL | Status: DC
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
## 2015-06-19 NOTE — Patient Instructions (Signed)
Several things may cause pain in the region that you are having symptoms. Certainly I do think the disc is part of the equation however the sacroiliac joint and perhaps the lumbar facet joints may be contributing as well. I would recommend an MRI and I will order this. There may be some insurance issues that could delay getting this. In addition I would recommend sacroiliac injection without cortisone as a diagnostic tool. In terms of pain medication I would recommend tramadol 50 mg 2 tablets 3 times per day

## 2015-06-27 ENCOUNTER — Encounter: Payer: Self-pay | Admitting: Physical Medicine & Rehabilitation

## 2015-06-27 ENCOUNTER — Ambulatory Visit (HOSPITAL_BASED_OUTPATIENT_CLINIC_OR_DEPARTMENT_OTHER): Payer: 59 | Admitting: Physical Medicine & Rehabilitation

## 2015-06-27 VITALS — BP 112/63 | HR 70

## 2015-06-27 DIAGNOSIS — M533 Sacrococcygeal disorders, not elsewhere classified: Secondary | ICD-10-CM | POA: Diagnosis not present

## 2015-06-27 DIAGNOSIS — G894 Chronic pain syndrome: Secondary | ICD-10-CM | POA: Diagnosis not present

## 2015-06-27 NOTE — Progress Notes (Signed)
  PROCEDURE RECORD [facility]   Name: Shannon [name] DOB:1960/09/06 MRN: [id number]  Date:06/27/2015  Physician: Alysia Penna, MD    Nurse/CMA: Calyse Murcia RN  Allergies:  Allergies  Allergen Reactions  . Eszopiclone     Headaches  . Flexeril [Cyclobenzaprine Hcl] Other (See Comments)    Mood swings  . Gabapentin     sedation  . Morphine And Related Other (See Comments)    No relief  . Nsaids Other (See Comments)    GI bleed  . Paroxetine Nausea Only       . Shellfish Allergy Other (See Comments)    skin & mouth tingle  . Tizanidine Hcl Other (See Comments)    Dizziness, mood swings    Consent Signed: Yes.    Is patient diabetic? No.  CBG today?   Pregnant: No. LMP: [name] was 06/21/2015. (age 17-55)  Anticoagulants: no Anti-inflammatory: yes (2 baby asa (81 mg ea)) Antibiotics: no  Procedure: right sacroilaic injection with 0.5% marcaine, no steroid Position: Prone Start Time: 9:31 End Time: 9:34 Fluoro Time: 1 sec  RN/CMA Biomedical engineer    Time 9:20 9:38    BP 112/63 108/64    Pulse 70 71    Respirations 14 14    O2 Sat 99 99    S/S 6 6    Pain Level 7/10 0/10     D/C home withhusband, patient A & O X 3, D/C instructions reviewed, and sits independently.

## 2015-06-27 NOTE — Addendum Note (Signed)
Addended by: Geryl Rankins D on: 06/27/2015 09:11 AM   Modules accepted: Orders

## 2015-06-27 NOTE — Progress Notes (Signed)
Right sacroiliac injection under fluoroscopic guidance  Indication: Right Low back and buttocks pain not relieved by medication management and other conservative care.  Informed consent was obtained after describing risks and benefits of the procedure with the patient, this includes bleeding, bruising, infection, paralysis and medication side effects. The patient wishes to proceed and has given written consent. The patient was placed in a prone position. The lumbar and sacral area was marked and prepped with Betadine. A 25-gauge 1-1/2 inch needle was inserted into the skin and subcutaneous tissue and 1 mL of 1% lidocaine was injected. Then a 25-gauge 3 inch spinal needle was inserted under fluoroscopic guidance into the Right sacroiliac joint. AP and lateral images were utilized. Omnipaque 180x0.5 mL under live fluoroscopy demonstrated no intravascular uptake. Then 0.5% marcaine was injected x 1.85ml. Patient tolerated the procedure well. Post procedure instructions were given. Please see post procedure form.

## 2015-06-27 NOTE — Patient Instructions (Signed)
No driving today may resume full activities tomorrow. Monitor your pain over the next day or 2 and pain attention to right-sided low back and buttocks pain . MRI will look for a pinched nerve I'm suspicious of right L4 or right L5 nerve  We'll recheck in 2 weeks

## 2015-07-11 ENCOUNTER — Telehealth: Payer: Self-pay | Admitting: Family

## 2015-07-11 ENCOUNTER — Ambulatory Visit: Payer: 59 | Admitting: Physical Medicine & Rehabilitation

## 2015-07-11 MED ORDER — CLONAZEPAM 0.5 MG PO TABS: ORAL_TABLET | ORAL | Status: DC

## 2015-07-11 NOTE — Telephone Encounter (Signed)
Caller name: Self  Can be reached: [phone] Pharmacy:  [facility], [address] [phone] (Phone) [phone] (Fax)         Reason for call: Refill clonazePAM (KLONOPIN) 0.5 MG tablet [id number]

## 2015-07-11 NOTE — Telephone Encounter (Signed)
See rx. 

## 2015-07-11 NOTE — Telephone Encounter (Signed)
Pt is requesting refill on Clonazepam.  Last OV: 03/13/2015 Last Fill: 06/13/2015 #70 and 0RF Pt sig: 1 tablet tid PRN UDS: 04/18/2015 Moderate risk  Please advise.

## 2015-07-11 NOTE — Telephone Encounter (Signed)
Rx faxed to pharmacy at 1:55pm, notified pt. Pt states she has an MRI tomorrow and thinks she will need to take 3 of her clonazepam and wants to know if chewing the tablets would get it in her system quicker. Advised pt per verbal from PCP not to chew tablets and only take 2 tablets prior to her MRI. Pt voices understanding.

## 2015-07-12 ENCOUNTER — Ambulatory Visit (HOSPITAL_BASED_OUTPATIENT_CLINIC_OR_DEPARTMENT_OTHER)
Admission: RE | Admit: 2015-07-12 | Discharge: 2015-07-12 | Disposition: A | Discharge status: Home / Self Care | Payer: 59 | Source: Ambulatory Visit | Attending: Physical Medicine & Rehabilitation | Admitting: Physical Medicine & Rehabilitation

## 2015-07-12 DIAGNOSIS — M4807 Spinal stenosis, lumbosacral region: Secondary | ICD-10-CM | POA: Diagnosis not present

## 2015-07-12 DIAGNOSIS — M4806 Spinal stenosis, lumbar region: Secondary | ICD-10-CM | POA: Diagnosis not present

## 2015-07-12 DIAGNOSIS — M5126 Other intervertebral disc displacement, lumbar region: Secondary | ICD-10-CM | POA: Insufficient documentation

## 2015-07-12 DIAGNOSIS — M5416 Radiculopathy, lumbar region: Secondary | ICD-10-CM | POA: Insufficient documentation

## 2015-07-12 DIAGNOSIS — Z9889 Other specified postprocedural states: Secondary | ICD-10-CM | POA: Insufficient documentation

## 2015-07-12 DIAGNOSIS — M5136 Other intervertebral disc degeneration, lumbar region: Secondary | ICD-10-CM | POA: Insufficient documentation

## 2015-07-12 DIAGNOSIS — M961 Postlaminectomy syndrome, not elsewhere classified: Secondary | ICD-10-CM | POA: Diagnosis not present

## 2015-07-12 MED ORDER — GADOBENATE DIMEGLUMINE 529 MG/ML IV SOLN: 11.0000 mL | Freq: Once | INTRAVENOUS | Status: DC | PRN

## 2015-07-15 ENCOUNTER — Encounter: Payer: 59 | Attending: Physical Medicine & Rehabilitation

## 2015-07-15 ENCOUNTER — Ambulatory Visit (HOSPITAL_BASED_OUTPATIENT_CLINIC_OR_DEPARTMENT_OTHER): Payer: 59 | Admitting: Physical Medicine & Rehabilitation

## 2015-07-15 ENCOUNTER — Encounter: Payer: Self-pay | Admitting: Physical Medicine & Rehabilitation

## 2015-07-15 VITALS — BP 111/59 | HR 68

## 2015-07-15 DIAGNOSIS — G8929 Other chronic pain: Secondary | ICD-10-CM | POA: Insufficient documentation

## 2015-07-15 DIAGNOSIS — S336XXA Sprain of sacroiliac joint, initial encounter: Secondary | ICD-10-CM | POA: Diagnosis not present

## 2015-07-15 DIAGNOSIS — M961 Postlaminectomy syndrome, not elsewhere classified: Secondary | ICD-10-CM

## 2015-07-15 DIAGNOSIS — M5416 Radiculopathy, lumbar region: Secondary | ICD-10-CM | POA: Insufficient documentation

## 2015-07-15 DIAGNOSIS — Z79899 Other long term (current) drug therapy: Secondary | ICD-10-CM | POA: Diagnosis not present

## 2015-07-15 DIAGNOSIS — M5136 Other intervertebral disc degeneration, lumbar region: Secondary | ICD-10-CM | POA: Diagnosis not present

## 2015-07-15 DIAGNOSIS — M47816 Spondylosis without myelopathy or radiculopathy, lumbar region: Secondary | ICD-10-CM | POA: Diagnosis not present

## 2015-07-15 DIAGNOSIS — Z5181 Encounter for therapeutic drug level monitoring: Secondary | ICD-10-CM | POA: Diagnosis not present

## 2015-07-15 DIAGNOSIS — M542 Cervicalgia: Secondary | ICD-10-CM | POA: Diagnosis not present

## 2015-07-15 DIAGNOSIS — G894 Chronic pain syndrome: Secondary | ICD-10-CM | POA: Insufficient documentation

## 2015-07-15 HISTORY — DX: Spondylosis without myelopathy or radiculopathy, lumbar region: M47.816

## 2015-07-15 NOTE — Patient Instructions (Signed)
Right lumbar medial branch block to anesthetize the lower lumbar facet joints

## 2015-07-15 NOTE — Progress Notes (Signed)
Subjective:    Patient ID: Shannon [name], female    DOB: [birth year], 55 y.o.   MRN: [id number]  HPI Sacroiliac Injection not helpful, However immediately after the procedure she went from 7 out of 10 pain to 0 out of 10 pain. Tramadol increased To 2 tablets 3 times per day which has been helpful for the patient She is wondering about her MRI results, she continues of low back pain mainly right-sided hip and buttock pain. Pain goes down posterior thigh to the knee but not beyond  She has an appointment with Dr. Ellene Route from neurosurgery tomorrow Patient states her symptoms have not worsened or changed since last visit. 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 She has difficulty doing the breaststroke as well as butterfly because of back pain. Generally speaking she does well when she is in the water but later after work she complains of soreness in the back area Pain Inventory Average Pain 7 Pain Right Now 6 My pain is na  In the last 24 hours, has pain interfered with the following? General activity 0 Relation with others 2 Enjoyment of life 2 What TIME of day is your pain at its worst? morning daytime Sleep (in general) Poor  Pain is worse with: walking, bending, sitting and some activites Pain improves with: heat/ice and medication Relief from Meds: 6  Mobility walk without assistance ability to climb steps?  yes do you drive?  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  no  Neuro/Psych weakness numbness tingling trouble walking anxiety  Prior Studies Any changes since last visit?  no Post-laminectomy syndrome. Right lumbar radicular pain. Fall out of bed 05/21/2015. Pain in the right low back and right hip. Numbness and tingling down the right leg and inside the groin. Right hip injection 2 weeks ago without relief. Lumbar injection 6 months ago without relief. Right foot drop.  EXAM: MRI LUMBAR SPINE WITHOUT AND  WITH CONTRAST  TECHNIQUE: Multiplanar and multiecho pulse sequences of the lumbar spine were obtained without and with intravenous contrast.  CONTRAST: 11 mL MultiHance  COMPARISON: 06/25/2011  FINDINGS: Vertebral alignment is unchanged and without significant listhesis. Vertebral body heights are preserved. There is diffuse lumbar disc desiccation. At L4-5, there is moderate to severe disc space height loss with mildly progressive, predominantly type 2 degenerative endplate changes. Mild disc space narrowing at L1-2 is slightly progressed, with associated mild type 1 and type 2 degenerative endplate changes. There is diffuse lumbar disc desiccation with relative exception of L3-4. Conus medullaris is normal in signal and terminates at L1. Paraspinal soft tissues are unremarkable.  L1-2: Mild disc bulging without spinal stenosis, not significantly changed.  L2-3: Stable to minimally increased disc bulging with a small central annular fissure again seen. No significant stenosis.  L3-4: Broad left subarticular/foraminal/ extraforaminal disc protrusion and mild facet hypertrophy result in mild to moderate left lateral recess stenosis, at most minimally progressed from prior. No spinal canal or neural foraminal stenosis.  L4-5: Prior laminectomies are again noted. Disc bulging asymmetric to the left, disc space height loss, and facet hypertrophy result in mild-to-moderate bilateral lateral recess stenosis and mild left neural foraminal stenosis, unchanged. No residual spinal stenosis.  L5-S1: Mild disc bulging, shallow left foraminal disc protrusion with annular fissure, and mild facet hypertrophy result in minimal left neural foraminal narrowing and mild bilateral lateral recess narrowing without spinal stenosis, unchanged.  IMPRESSION: 1. Postsurgical changes at L4-5 with  progressive degenerative endplate changes. Mild-to-moderate residual bilateral lateral  recess stenosis and mild left neural foraminal stenosis are unchanged. 2. Left-sided L3-4 disc protrusion resulting in mild-to-moderate left lateral recess stenosis, at most minimally progressed from prior. 3. Unchanged, mild bilateral lateral recess narrowing at L5-S1.   Electronically Signed  By: Logan Bores M.D.  On: 07/13/2015 08:45  Physicians involved in your care Any changes since last visit?  no   Family History  Problem Relation Age of Onset  . Hypertension Mother   . Diabetes Father   . Hypertension Father   . Hypothyroidism Father   . Alcohol abuse Maternal Grandmother   . Cancer Paternal Grandfather     lung cancer  . Hashimoto's thyroiditis Sister   . Hypothyroidism Sister   . Hypothyroidism Other    Social History   Social History  . Marital Status: Married    Spouse Name: N/A  . Number of Children: 3  . Years of Education: N/A   Occupational History  .     Social History Main Topics  . Smoking status: Never Smoker   . Smokeless tobacco: Never Used  . Alcohol Use: No  . Drug Use: Yes  . Sexual Activity:    Partners: Male    Patent examiner Protection: Surgical   Other Topics Concern  . None   Social History Narrative   Regular exercise:  Yes (Cabin crew)   Caffeine Use:  1 daily   Married 3 children ages 17 son, 5 son, and 86 daughter   Associates degree               Past Surgical History  Procedure Laterality Date  . Cholecystectomy  08/2009    reports history of gallbladder polyps, she reports stents in duct of lushca   . Appendectomy  1976  . Tonsillectomy  1982  . Laminectomy  1994    L4-5  . Tubal ligation    . Hysteroscopy  07/2011   Past Medical History  Diagnosis Date  . Anemia   . Depression   . Eating disorder     anorexia nervosa in her 20's  . Fainting spell   . Kidney stones     passed on their own  . Hx of laminectomy 1994    L4-5  . Stomach disorder     due to complications with  cholecystectomy "almost died"  . Anxiety   . Migraine    BP 111/59 mmHg  Pulse 68  SpO2 100%  LMP 06/21/2015  Opioid Risk Score:   Fall Risk Score:  `1  Depression screen PHQ 2/9  Depression screen [facility] 2/9 07/15/2015 06/27/2015 06/19/2015  Decreased Interest 1 1 1   Down, Depressed, Hopeless 0 0 0  PHQ - 2 Score 1 1 1      Review of Systems  Gastrointestinal: Positive for nausea and constipation.  All other systems reviewed and are negative.      Objective:   Physical Exam  Constitutional: She is oriented to person, place, and time. She appears well-developed and well-nourished. No distress.  HENT:  Head: Normocephalic and atraumatic.  Eyes: Conjunctivae and EOM are normal. Pupils are equal, round, and reactive to light.  Neck: Normal range of motion.  Neurological: She is alert and oriented to person, place, and time. She has normal strength. She displays no atrophy. No sensory deficit. She exhibits normal muscle tone. Coordination and gait normal.  Motor strength is 5/5 bilateral deltoid, biceps, triceps, grip, hip flexor, knee extensor, ankle  dorsiflexor and plantar flexor  Psychiatric: She has a normal mood and affect.  Nursing note and vitals reviewed.  Has tenderness to palpation over the right PSIS area but also tenderness along the paraspinals from L4 through lumbosacral junction. This is more towards right side. She also has tenderness over the right greater trochanter as well as gluteus medius area. Negative straight leg raising test       Assessment & Plan:  1. Chronic low back pain has a history of L4-5 disc with degeneration but no sign of nerve root compression affecting any of the lumbar spinal nerves on the right side. She does have evidence of lumbar facet arthropathy particularly at bilateral L5-S1. She is not inclined towards surgery. She does have an appointment with neurosurgery tomorrow however. This was previously scheduled.  In addition patient has  short-term relief for sacroiliac injection. This may be repeated to confirm this. However given her symptomatology as well as imaging studies she may benefit from bilateral L3 L4 L5 medial branch blocks under fluoroscopic guidance  We'll continue tramadol 100 mg 3 times a day  Return in 2 weeks for the injections.  If the medial branch blocks are not helpful, would consider right hip trochanter bursa injection  We have reviewed the MRI results including the actual images Over half of the 25 min visit was spent counseling and coordinating care.

## 2015-08-01 ENCOUNTER — Ambulatory Visit (HOSPITAL_BASED_OUTPATIENT_CLINIC_OR_DEPARTMENT_OTHER): Payer: 59 | Admitting: Physical Medicine & Rehabilitation

## 2015-08-01 ENCOUNTER — Encounter: Payer: Self-pay | Admitting: Physical Medicine & Rehabilitation

## 2015-08-01 VITALS — BP 112/72 | HR 71 | Resp 14

## 2015-08-01 DIAGNOSIS — M47816 Spondylosis without myelopathy or radiculopathy, lumbar region: Secondary | ICD-10-CM

## 2015-08-01 DIAGNOSIS — G894 Chronic pain syndrome: Secondary | ICD-10-CM | POA: Diagnosis not present

## 2015-08-01 MED ORDER — TRAMADOL HCL 50 MG PO TABS: 100.0000 mg | ORAL_TABLET | Freq: Three times a day (TID) | ORAL | Status: DC

## 2015-08-01 NOTE — Patient Instructions (Signed)

## 2015-08-01 NOTE — Progress Notes (Signed)
Right lumbar L3, L4 medial branch blocks and L5 dorsal ramus injection under fluoroscopic guidance  Indication: Right Lumbar pain which is not relieved by medication management or other conservative care and interfering with self-care and mobility.  Informed consent was obtained after describing risks and benefits of the procedure with the patient, this includes bleeding, bruising, infection, paralysis and medication side effects. The patient wishes to proceed and has given written consent. The patient was placed in a prone position. The lumbar area was marked and prepped with Betadine. One ML of 1% lidocaine was injected into each of 3 areas into the skin and subcutaneous tissue. Then a 22-gauge 3.5in spinal needle was inserted targeting the junction of the Right S1 superior articular process and sacral ala junction. Needle was advanced under fluoroscopic guidance. Bone contact was made. Omnipaque 180 was injected x0.5 mL demonstrating no intravascular uptake. Then a solution containing one ML of 4 mg per mL dexamethasone and 3 mL of 2% MPF lidocaine was injected x0.5 mL. Then the Right L5 superior articular process in transverse process junction was targeted. Bone contact was made. Omnipaque 180 was injected x0.5 mL demonstrating no intravascular uptake. Then a solution containing one ML of 4 mg per mL dexamethasone and 3 mL of 2% MPF lidocaine was injected x0.5 mL. Then the Right L4 superior articular process in transverse process junction was targeted. Bone contact was made. Omnipaque 180 was injected x0.5 mL demonstrating no intravascular uptake. Then a solution containing one ML of 4 mg per mL dexamethasone and 3 mL of 2% MPF lidocaine was injected x0.5 mL Patient tolerated procedure well. Post procedure instructions were given. Please refer to post procedure form.  Preinjection pain 8.5/10 Post injection pain 2/10

## 2015-08-01 NOTE — Progress Notes (Signed)
  PROCEDURE RECORD [facility]   Name: Shannon [name] DOB:04-12-1961 MRN: [id number]  Date:08/01/2015  Physician: Alysia Penna, MD    Nurse/CMA: Gara Kroner  Allergies:  Allergies  Allergen Reactions  . Eszopiclone     Headaches  . Flexeril [Cyclobenzaprine Hcl] Other (See Comments)    Mood swings  . Gabapentin     sedation  . Morphine And Related Other (See Comments)    No relief  . Nsaids Other (See Comments)    GI bleed  . Paroxetine Nausea Only       . Shellfish Allergy Other (See Comments)    skin & mouth tingle  . Tizanidine Hcl Other (See Comments)    Dizziness, mood swings    Consent Signed: Yes.    Is patient diabetic? No.  CBG today? NA  Pregnant: No. LMP: [name] was 07/19/2015. (age 57-55)  Anticoagulants: no Anti-inflammatory: no Antibiotics: no  Procedure:  Medial Branch Block Position: Prone Start Time: 1306 End Time: 1312 Fluoro Time: 23  RN/CMA Yavonne Kiss [facility]	[place]    Time 1245 1317    BP 112/72 121/69    Pulse 71 72    Respirations 14 14    O2 Sat 100 99    S/S 6 6    Pain Level 8.5/10 2/10     D/C home with son, patient A & O X 3, D/C instructions reviewed, and sits independently.

## 2015-08-05 ENCOUNTER — Telehealth: Payer: Self-pay | Admitting: Family

## 2015-08-05 MED ORDER — CLONAZEPAM 0.5 MG PO TABS: ORAL_TABLET | ORAL | Status: DC

## 2015-08-05 NOTE — Telephone Encounter (Signed)
Rx faxed to pharmacy and notified pt. 

## 2015-08-05 NOTE — Telephone Encounter (Signed)
Melissa-- please advise request.  Last rx on 07/11/15, #70.

## 2015-08-05 NOTE — Telephone Encounter (Signed)
Caller name: Shannon [name] Relation to pt: self Call back number: [phone] [facility] [address]  Reason for call: Pt states needs rx for clonazePAM (KLONOPIN) 0.5 MG tablet, Pt mentioned her father had a heart attack and that she is going threw anxiety, she only has 4 pills left. Pt knows is two days earlier for her rx, but since she is going threw a lot needs rx to keep her stable. Please advise.

## 2015-08-05 NOTE — Telephone Encounter (Signed)
See rx. 

## 2015-08-26 ENCOUNTER — Ambulatory Visit: Payer: Self-pay | Admitting: Physical Medicine & Rehabilitation

## 2015-09-05 ENCOUNTER — Telehealth: Payer: Self-pay | Admitting: Family

## 2015-09-05 MED ORDER — CLONAZEPAM 0.5 MG PO TABS: ORAL_TABLET | ORAL | Status: DC

## 2015-09-05 NOTE — Telephone Encounter (Signed)
Rx faxed to pharmacy. Left detailed message on pt's voicemail re: Rx completion.

## 2015-09-05 NOTE — Telephone Encounter (Signed)
Last rx 08/05/15, #70. Next UDS is due now and will collect at upcoming appt on 09/24/15.  Rx printed and forwarded to PCP for signature.

## 2015-09-05 NOTE — Telephone Encounter (Signed)
Relationship to patient: self Can be reached: [address]  Reason for call: pt needing refill on clonazepam. Has 5 left. Takes up to 2/day.

## 2015-09-08 ENCOUNTER — Ambulatory Visit: Payer: Self-pay | Admitting: Physical Medicine & Rehabilitation

## 2015-09-16 ENCOUNTER — Ambulatory Visit: Payer: 59 | Admitting: Physical Medicine & Rehabilitation

## 2015-09-22 ENCOUNTER — Encounter: Payer: 59 | Attending: Physical Medicine & Rehabilitation

## 2015-09-22 ENCOUNTER — Encounter: Payer: Self-pay | Admitting: Physical Medicine & Rehabilitation

## 2015-09-22 ENCOUNTER — Ambulatory Visit (HOSPITAL_BASED_OUTPATIENT_CLINIC_OR_DEPARTMENT_OTHER): Payer: 59 | Admitting: Physical Medicine & Rehabilitation

## 2015-09-22 VITALS — BP 104/60 | HR 65 | Resp 14

## 2015-09-22 DIAGNOSIS — Z5181 Encounter for therapeutic drug level monitoring: Secondary | ICD-10-CM | POA: Insufficient documentation

## 2015-09-22 DIAGNOSIS — M5136 Other intervertebral disc degeneration, lumbar region: Secondary | ICD-10-CM | POA: Diagnosis not present

## 2015-09-22 DIAGNOSIS — M47816 Spondylosis without myelopathy or radiculopathy, lumbar region: Secondary | ICD-10-CM

## 2015-09-22 DIAGNOSIS — M542 Cervicalgia: Secondary | ICD-10-CM | POA: Insufficient documentation

## 2015-09-22 DIAGNOSIS — S336XXD Sprain of sacroiliac joint, subsequent encounter: Secondary | ICD-10-CM

## 2015-09-22 DIAGNOSIS — M961 Postlaminectomy syndrome, not elsewhere classified: Secondary | ICD-10-CM | POA: Diagnosis present

## 2015-09-22 DIAGNOSIS — G894 Chronic pain syndrome: Secondary | ICD-10-CM | POA: Insufficient documentation

## 2015-09-22 DIAGNOSIS — Z79899 Other long term (current) drug therapy: Secondary | ICD-10-CM | POA: Insufficient documentation

## 2015-09-22 DIAGNOSIS — S336XXA Sprain of sacroiliac joint, initial encounter: Secondary | ICD-10-CM | POA: Insufficient documentation

## 2015-09-22 DIAGNOSIS — M5416 Radiculopathy, lumbar region: Secondary | ICD-10-CM | POA: Diagnosis present

## 2015-09-22 DIAGNOSIS — G8929 Other chronic pain: Secondary | ICD-10-CM | POA: Diagnosis not present

## 2015-09-22 NOTE — Patient Instructions (Signed)
Procedure is repeat medial branch blocks at L3 L4 L5. I do think some of this pain is radiating into the buttock area. If you have a good temporary relief with this repeat injection the next step would be radiofrequency ablation

## 2015-09-22 NOTE — Progress Notes (Signed)
Subjective:    Patient ID: Shannon [name], female    DOB: 04/30/1961, 55 y.o.   MRN: [id number]  HPI Right-sided L3 L4 L5 medial branch blocks performed 08/01/2015 resulted in a reduction of pain from 8.5/10-2/10 which persisted for about 2 weeks. The pain that usually bothered her at night which occurred in the right side of the buttocks also subsided during that time. Previously she had a good relief of low back pain after a right sacroiliac injection performed on 06/27/2015.  Patient has questions regarding what her next step should be. She was hoping to have a longer duration of response from the injections however we did discuss that the positive response is favorable and that the duration of response is highly variable and could be anywhere between Hours and months. 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 General activity 3 Relation with others 0 Enjoyment of life 2 What TIME of day is your pain at its worst? morning, night Sleep (in general) Fair  Pain is worse with: bending, sitting and some activites Pain improves with: rest, therapy/exercise, medication and TENS Relief from Meds: 7  Mobility walk without assistance  Function employed # of hrs/week 60  Neuro/Psych anxiety  Prior Studies Any changes since last visit?  no  Physicians involved in your care Any changes since last visit?  no   Family History  Problem Relation Age of Onset  . Hypertension Mother   . Diabetes Father   . Hypertension Father   . Hypothyroidism Father   . Alcohol abuse Maternal Grandmother   . Cancer Paternal Grandfather     lung cancer  . Hashimoto's thyroiditis Sister   . Hypothyroidism Sister   . Hypothyroidism Other    Social History   Social History  . Marital Status: Married   Spouse Name: N/A  . Number of Children: 3  . Years of Education: N/A   Occupational History  .     Social History Main Topics  . Smoking status: Never Smoker   . Smokeless tobacco: Never Used  . Alcohol Use: No  . Drug Use: Yes  . Sexual Activity:    Partners: Male    Patent examiner Protection: Surgical   Other Topics Concern  . None   Social History Narrative   Regular exercise:  Yes (Cabin crew)   Caffeine Use:  1 daily   Married 3 children ages 50 son, 81 son, and 37 daughter   Associates degree               Past Surgical History  Procedure Laterality Date  . Cholecystectomy  08/2009    reports history of gallbladder polyps, she reports stents in duct of lushca   . Appendectomy  1976  . Tonsillectomy  1982  . Laminectomy  1994    L4-5  . Tubal ligation    . Hysteroscopy  07/2011   Past Medical History  Diagnosis Date  . Anemia   . Depression   . Eating disorder     anorexia nervosa in her 20's  . Fainting spell   . Kidney stones     passed on their own  . Hx of laminectomy 1994    L4-5  . Stomach disorder     due to complications with cholecystectomy "almost  died"  . Anxiety   . Migraine    BP 104/60 mmHg  Pulse 65  Resp 14  SpO2 99%  Opioid Risk Score:   Fall Risk Score:  `1  Depression screen PHQ 2/9  Depression screen [facility] 2/9 07/15/2015 06/27/2015 06/19/2015  Decreased Interest 1 1 1   Down, Depressed, Hopeless 0 0 0  PHQ - 2 Score 1 1 1      Review of Systems  Gastrointestinal: Positive for nausea.  All other systems reviewed and are negative.      Objective:   Physical Exam  Constitutional: She is oriented to person, place, and time. She appears well-developed and well-nourished.  HENT:  Head: Normocephalic and atraumatic.  Eyes: Conjunctivae and EOM are normal. Pupils are equal, round, and reactive to light.  Neck: Normal range of motion.  Musculoskeletal: Normal range of motion.       Lumbar back: She exhibits  tenderness. She exhibits normal range of motion and no deformity.  Tenderness along the lumbosacral paraspinal muscles.  Neurological: She is alert and oriented to person, place, and time. She has normal strength.  Normal strength in both lower limbs  Negative straight leg raising  Psychiatric: She has a normal mood and affect.  Nursing note and vitals reviewed.         Assessment & Plan:  1. Chronic low back pain she has had positive response to both sacroiliac injection as well as right L3 L4 L5 medial branch blocks. Her buttocks pain was also relieved after the medial branch blocks but not with the sacroiliac injection. We discussed next steps. Given that her low back pain during the day as well as her nighttime buttocks pain was relieved with her medial branch blocks I would recommend repeating these and if a similar temporary but greater than 50% relief is achieved, would proceed to radiofrequency neurotomy.  We also discussed that since she has some pain emanating from the sacroiliac injection that we may have to treat that as a separate issue once the primary issue is addressed.  Patient agrees with the above plan Repeat right-sided L3 L4 L5 medial branch blocks in 2 weeks

## 2015-09-24 ENCOUNTER — Encounter: Payer: Self-pay | Admitting: Family

## 2015-09-24 ENCOUNTER — Ambulatory Visit (INDEPENDENT_AMBULATORY_CARE_PROVIDER_SITE_OTHER): Payer: 59 | Admitting: Family

## 2015-09-24 VITALS — BP 113/67 | HR 70 | Temp 98.0°F | Resp 20 | Ht 66.0 in | Wt 125.4 lb

## 2015-09-24 DIAGNOSIS — F411 Generalized anxiety disorder: Secondary | ICD-10-CM | POA: Diagnosis not present

## 2015-09-24 MED ORDER — BUSPIRONE HCL 7.5 MG PO TABS: 7.5000 mg | ORAL_TABLET | Freq: Two times a day (BID) | ORAL | Status: DC

## 2015-09-24 NOTE — Progress Notes (Signed)
Pre visit review using our clinic review tool, if applicable. No additional management support is needed unless otherwise documented below in the visit note. 

## 2015-09-24 NOTE — Assessment & Plan Note (Signed)
Anxiety is deteriorated. Trial of buspar.  Had weight gain with SSRI's and increased risk of serotonin syndrome with SNRI's and concomitant use of tramadol.

## 2015-09-24 NOTE — Progress Notes (Signed)
Subjective:    Patient ID: Shannon [name], female    DOB: 1960-12-18, 55 y.o.   MRN: [id number]  HPI  Shannon [name] is a 55 yr old female who presents today to discuss anxiety.  She reports increased anxiety and family stress recently.  Reports that her panic attacks are worse right before her menstrual cycle.   Sister has been sick- chronic inflammatory demyelinating peripheral neuropathy from lyme disease.  She has been flying up to help her sister.  Dad has been sick, MI.  She has been worried about both of their health. Her son recently announced that his girlfriend is pregnant and he is planning to get married. She denies depression but finds herself needing to use klonopin more frequently.    Review of Systems See HPI  Past Medical History  Diagnosis Date  . Anemia   . Depression   . Eating disorder     anorexia nervosa in her 20's  . Fainting spell   . Kidney stones     passed on their own  . Hx of laminectomy 1994    L4-5  . Stomach disorder     due to complications with cholecystectomy "almost died"  . Anxiety   . Migraine      Social History   Social History  . Marital Status: Married    Spouse Name: N/A  . Number of Children: 3  . Years of Education: N/A   Occupational History  .     Social History Main Topics  . Smoking status: Never Smoker   . Smokeless tobacco: Never Used  . Alcohol Use: No  . Drug Use: Yes  . Sexual Activity:    Partners: Male    Patent examiner Protection: Surgical   Other Topics Concern  . Not on file   Social History Narrative   Regular exercise:  Yes (swim instructor)   Caffeine Use:  1 daily   Married 3 children ages 11 son, 54 son, and 25 daughter   Associates degree                Past Surgical History  Procedure Laterality Date  . Cholecystectomy  08/2009    reports history of gallbladder polyps, she reports stents in duct of lushca   . Appendectomy  1976  . Tonsillectomy  1982  . Laminectomy  1994   L4-5  . Tubal ligation    . Hysteroscopy  07/2011    Family History  Problem Relation Age of Onset  . Hypertension Mother   . Diabetes Father   . Hypertension Father   . Hypothyroidism Father   . Alcohol abuse Maternal Grandmother   . Cancer Paternal Grandfather     lung cancer  . Hashimoto's thyroiditis Sister   . Hypothyroidism Sister   . Hypothyroidism Other     Allergies  Allergen Reactions  . Eszopiclone     Headaches  . Flexeril [Cyclobenzaprine Hcl] Other (See Comments)    Mood swings  . Gabapentin     sedation  . Morphine And Related Other (See Comments)    No relief  . Nsaids Other (See Comments)    GI bleed  . Paroxetine Nausea Only       . Shellfish Allergy Other (See Comments)    skin & mouth tingle  . Tizanidine Hcl Other (See Comments)    Dizziness, mood swings    Current Outpatient Prescriptions on File Prior to Visit  Medication Sig Dispense Refill  . clonazePAM (  KLONOPIN) 0.5 MG tablet TAKE 1 TABLET THREE TIMES DAILY AS NEEDED FOR ANXIETY 70 tablet 0  . traMADol (ULTRAM) 50 MG tablet Take 2 tablets (100 mg total) by mouth 3 (three) times daily. 180 tablet 5   No current facility-administered medications on file prior to visit.    BP 113/67 mmHg  Pulse 70  Temp(Src) 98 F (36.7 C) (Oral)  Resp 20  Ht 5\' 6"  (1.676 m)  Wt 125 lb 6.4 oz (56.881 kg)  BMI 20.25 kg/m2  SpO2 100%  LMP 09/17/2015        Objective:   Physical Exam  Constitutional: She is oriented to person, place, and time. She appears well-developed and well-nourished. No distress.  Neurological: She is alert and oriented to person, place, and time.  Psychiatric: Her behavior is normal. Judgment and thought content normal.  tearful          Assessment & Plan:  25 minutes spent with pt.  >50% of this time was spent counseling pt on anxiety and treatment.

## 2015-09-24 NOTE — Patient Instructions (Signed)
Start buspar twice daily. Follow up in 1 months.

## 2015-10-06 ENCOUNTER — Telehealth: Payer: Self-pay | Admitting: Family

## 2015-10-06 MED ORDER — CLONAZEPAM 0.5 MG PO TABS: ORAL_TABLET | ORAL | Status: DC

## 2015-10-06 NOTE — Telephone Encounter (Signed)
Last Clonazepam refill:  09/05/15, #70 Last UDS: 04/2015 and due for repeat  Will obtain UDS at f/u in June.  Rx printed and forwarded to PCP for signature.

## 2015-10-06 NOTE — Telephone Encounter (Signed)
[facility], notified pt.

## 2015-10-06 NOTE — Telephone Encounter (Signed)
Caller name: Taye Relation to pt: self Call back number: [phone] Pharmacy: [id number] - [place], [place]  Reason for call: Pt requesting refill clonazePAM (KLONOPIN) 0.5 MG. Please advise.

## 2015-10-06 NOTE — Telephone Encounter (Signed)
The correct pharmacy to have it sent to is : [facility], [address].

## 2015-10-14 ENCOUNTER — Ambulatory Visit (HOSPITAL_BASED_OUTPATIENT_CLINIC_OR_DEPARTMENT_OTHER): Payer: 59 | Admitting: Physical Medicine & Rehabilitation

## 2015-10-14 ENCOUNTER — Encounter: Payer: 59 | Attending: Physical Medicine & Rehabilitation

## 2015-10-14 ENCOUNTER — Encounter: Payer: Self-pay | Admitting: Physical Medicine & Rehabilitation

## 2015-10-14 VITALS — BP 92/61 | HR 73 | Resp 14

## 2015-10-14 DIAGNOSIS — Z5181 Encounter for therapeutic drug level monitoring: Secondary | ICD-10-CM | POA: Insufficient documentation

## 2015-10-14 DIAGNOSIS — M961 Postlaminectomy syndrome, not elsewhere classified: Secondary | ICD-10-CM | POA: Diagnosis present

## 2015-10-14 DIAGNOSIS — S336XXA Sprain of sacroiliac joint, initial encounter: Secondary | ICD-10-CM | POA: Insufficient documentation

## 2015-10-14 DIAGNOSIS — M542 Cervicalgia: Secondary | ICD-10-CM | POA: Diagnosis not present

## 2015-10-14 DIAGNOSIS — M5416 Radiculopathy, lumbar region: Secondary | ICD-10-CM | POA: Insufficient documentation

## 2015-10-14 DIAGNOSIS — Z79899 Other long term (current) drug therapy: Secondary | ICD-10-CM | POA: Insufficient documentation

## 2015-10-14 DIAGNOSIS — G8929 Other chronic pain: Secondary | ICD-10-CM | POA: Insufficient documentation

## 2015-10-14 DIAGNOSIS — M5136 Other intervertebral disc degeneration, lumbar region: Secondary | ICD-10-CM | POA: Diagnosis not present

## 2015-10-14 DIAGNOSIS — G894 Chronic pain syndrome: Secondary | ICD-10-CM | POA: Diagnosis not present

## 2015-10-14 DIAGNOSIS — M47817 Spondylosis without myelopathy or radiculopathy, lumbosacral region: Secondary | ICD-10-CM | POA: Diagnosis not present

## 2015-10-14 DIAGNOSIS — M47816 Spondylosis without myelopathy or radiculopathy, lumbar region: Secondary | ICD-10-CM

## 2015-10-14 NOTE — Patient Instructions (Signed)

## 2015-10-14 NOTE — Progress Notes (Signed)
Right lumbar L3, L4 medial branch blocks and L5 dorsal ramus injection under fluoroscopic guidance  Indication: Right Lumbar pain which is not relieved by medication management or other conservative care and interfering with self-care and mobility.  Informed consent was obtained after describing risks and benefits of the procedure with the patient, this includes bleeding, bruising, infection, paralysis and medication side effects. The patient wishes to proceed and has given written consent. The patient was placed in a prone position. The lumbar area was marked and prepped with Betadine. One ML of 1% lidocaine was injected into each of 3 areas into the skin and subcutaneous tissue. Then a 22-gauge 3.5in spinal needle was inserted targeting the junction of the Right S1 superior articular process and sacral ala junction. Needle was advanced under fluoroscopic guidance. Bone contact was made. Omnipaque 180 was injected x0.5 mL demonstrating no intravascular uptake. Then a solution containing one ML of 4 mg per mL dexamethasone and 3 mL of 2% MPF lidocaine was injected x0.5 mL. Then the Right L5 superior articular process in transverse process junction was targeted. Bone contact was made. Omnipaque 180 was injected x0.5 mL demonstrating no intravascular uptake. Then a solution containing one ML of 4 mg per mL dexamethasone and 3 mL of 2% MPF lidocaine was injected x0.5 mL. Then the Right L4 superior articular process in transverse process junction was targeted. Bone contact was made. Omnipaque 180 was injected x0.5 mL demonstrating no intravascular uptake. Then a solution containing one ML of 4 mg per mL dexamethasone and 3 mL of 2% MPF lidocaine was injected x0.5 mL Patient tolerated procedure well. Post procedure instructions were given. Please refer to post procedure form.  Preinjection pain 9 /10 Post injection pain 4.5/10

## 2015-10-14 NOTE — Progress Notes (Signed)
  PROCEDURE RECORD [facility]   Name: Shannon [name] DOB:1960/11/03 MRN: [id number]  Date:10/14/2015  Physician: Alysia Penna, MD    Nurse/CMA: Bernadette Armijo, CMA  Allergies:  Allergies  Allergen Reactions  . Eszopiclone     Headaches  . Flexeril [Cyclobenzaprine Hcl] Other (See Comments)    Mood swings  . Gabapentin     sedation  . Morphine And Related Other (See Comments)    No relief  . Nsaids Other (See Comments)    GI bleed  . Paroxetine Nausea Only       . Shellfish Allergy Other (See Comments)    skin & mouth tingle  . Tizanidine Hcl Other (See Comments)    Dizziness, mood swings     Consent Signed: Yes.    Is patient diabetic? No.  CBG today? N/A  Pregnant: No. LMP: [name] was 09/17/2015. (age 30-55)  Anticoagulants: no Anti-inflammatory: no Antibiotics: no  Procedure: Medial Branch Block  Position: Prone Start Time: 2:30pm  End Time: 2:35pm  Fluoro Time: 23  RN/CMA Gaige Fussner, CMA Etter Royall, CMA    Time 2:10 pm 2:41pm    BP 92/61 107/71    Pulse 73 71    Respirations 14 14    O2 Sat 99 99    S/S 6 6    Pain Level 9/10 4/10     D/C home with husband, patient A & O X 3, D/C instructions reviewed, and sits independently.

## 2015-10-16 ENCOUNTER — Encounter: Payer: Self-pay | Admitting: Physical Medicine & Rehabilitation

## 2015-10-27 ENCOUNTER — Ambulatory Visit: Payer: Self-pay | Admitting: Family

## 2015-11-03 ENCOUNTER — Ambulatory Visit (INDEPENDENT_AMBULATORY_CARE_PROVIDER_SITE_OTHER): Payer: 59 | Admitting: Family

## 2015-11-03 ENCOUNTER — Other Ambulatory Visit: Payer: Self-pay | Admitting: Family Medicine

## 2015-11-03 ENCOUNTER — Encounter: Payer: Self-pay | Admitting: Family

## 2015-11-03 VITALS — BP 90/70 | HR 65 | Temp 98.0°F | Resp 16 | Ht 66.0 in | Wt 125.0 lb

## 2015-11-03 DIAGNOSIS — F411 Generalized anxiety disorder: Secondary | ICD-10-CM

## 2015-11-03 NOTE — Telephone Encounter (Signed)
Rx faxed to pharmacy at [place]. Notified pt.

## 2015-11-03 NOTE — Progress Notes (Signed)
Pre visit review using our clinic review tool, if applicable. No additional management support is needed unless otherwise documented below in the visit note. 

## 2015-11-03 NOTE — Assessment & Plan Note (Signed)
Stable on klonopin only.  Will monitor.  UDS collected today.

## 2015-11-03 NOTE — Telephone Encounter (Signed)
Clonazepam Rx printed and forwarded to PCP for signature.

## 2015-11-03 NOTE — Patient Instructions (Addendum)
Please remain off of Buspar. Please follow up in 6 months, sooner if problems/concerns.

## 2015-11-03 NOTE — Progress Notes (Signed)
Subjective:    Patient ID: Shannon [name], female    DOB: 04-Sep-1960, 55 y.o.   MRN: [id number]  HPI  Shannon [name] is a 55 yr old female who presents today for follow up of her anxiety. Reports that she had constipation on buspar and had to stop the medication. Reports that her anxiety is currently well controlled on current dose of klonopin. 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 This is making her feel less stressed and anxious. Mood is good. Does not wish to take any further anti-anxiety medications at this time.   Review of Systems See HPI  Past Medical History  Diagnosis Date  . Anemia   . Depression   . Eating disorder     anorexia nervosa in her 20's  . Fainting spell   . Kidney stones     passed on their own  . Hx of laminectomy 1994    L4-5  . Stomach disorder     due to complications with cholecystectomy "almost died"  . Anxiety   . Migraine      Social History   Social History  . Marital Status: Married    Spouse Name: N/A  . Number of Children: 3  . Years of Education: N/A   Occupational History  .     Social History Main Topics  . Smoking status: Never Smoker   . Smokeless tobacco: Never Used  . Alcohol Use: No  . Drug Use: Yes  . Sexual Activity:    Partners: Male    Patent examiner Protection: Surgical   Other Topics Concern  . Not on file   Social History Narrative   Regular exercise:  Yes (swim instructor)   Caffeine Use:  1 daily   Married 3 children ages 48 son, 18 son, and 64 daughter   Associates degree                Past Surgical History  Procedure Laterality Date  . Cholecystectomy  08/2009    reports history of gallbladder polyps, she reports stents in duct of lushca   . Appendectomy  1976  . Tonsillectomy  1982  . Laminectomy  1994    L4-5  . Tubal ligation    . Hysteroscopy  07/2011    Family History  Problem Relation Age of Onset  . Hypertension Mother   . Diabetes Father     . Hypertension Father   . Hypothyroidism Father   . Alcohol abuse Maternal Grandmother   . Cancer Paternal Grandfather     lung cancer  . Hashimoto's thyroiditis Sister   . Hypothyroidism Sister   . Hypothyroidism Other   . Neuropathy Sister     chronic inflammatory demyelinating peripheral neuropathy from lyme disease  . CAD Father     Allergies  Allergen Reactions  . Buspar [Buspirone]     constipation  . Eszopiclone     Headaches  . Flexeril [Cyclobenzaprine Hcl] Other (See Comments)    Mood swings  . Gabapentin     sedation  . Morphine And Related Other (See Comments)    No relief  . Nsaids Other (See Comments)    GI bleed  . Paroxetine Nausea Only       . Shellfish Allergy Other (See Comments)    skin & mouth tingle  . Tizanidine Hcl Other (See Comments)    Dizziness, mood swings    Current Outpatient Prescriptions on File Prior to Visit  Medication Sig Dispense Refill  . clonazePAM (KLONOPIN) 0.5 MG tablet TAKE 1 TABLET THREE TIMES DAILY AS NEEDED FOR ANXIETY 70 tablet 0  . traMADol (ULTRAM) 50 MG tablet Take 2 tablets (100 mg total) by mouth 3 (three) times daily. 180 tablet 5   No current facility-administered medications on file prior to visit.    BP 90/70 mmHg  Pulse 65  Temp(Src) 98 F (36.7 C) (Oral)  Resp 16  Ht 5\' 6"  (1.676 m)  Wt 125 lb (56.7 kg)  BMI 20.19 kg/m2  SpO2 98%  LMP 10/19/2015       Objective:   Physical Exam  Constitutional: She is oriented to person, place, and time. She appears well-developed and well-nourished.  Cardiovascular: Normal rate, regular rhythm and normal heart sounds.   No murmur heard. Pulmonary/Chest: Effort normal and breath sounds normal. No respiratory distress. She has no wheezes.  Musculoskeletal: She exhibits no edema.  Neurological: She is alert and oriented to person, place, and time.  Skin: Skin is warm and dry.  Psychiatric: She has a normal mood and affect. Her behavior is normal. Judgment and  thought content normal.          Assessment & Plan:

## 2015-11-06 ENCOUNTER — Telehealth: Payer: Self-pay

## 2015-11-06 NOTE — Telephone Encounter (Signed)
Pt states that she is rescheduling her nerve block because she is feeling good from her last injection and Tramadol. FYI.

## 2015-11-11 ENCOUNTER — Ambulatory Visit: Payer: Self-pay

## 2015-11-11 ENCOUNTER — Ambulatory Visit: Payer: Self-pay | Admitting: Physical Medicine & Rehabilitation

## 2015-11-19 ENCOUNTER — Encounter: Payer: Self-pay | Admitting: Family

## 2015-12-03 ENCOUNTER — Telehealth: Payer: Self-pay | Admitting: Family

## 2015-12-03 MED ORDER — CLONAZEPAM 0.5 MG PO TABS: ORAL_TABLET | ORAL | 0 refills | Status: DC

## 2015-12-03 NOTE — Telephone Encounter (Signed)
Pt request a refill on her clonazePAM   Pharmacy: [facility], [address]

## 2015-12-03 NOTE — Addendum Note (Signed)
Addended by: Kelle Darting A on: 12/03/2015 03:27 PM   Modules accepted: Orders

## 2015-12-03 NOTE — Telephone Encounter (Signed)
Next OV: 05/05/16 Last RX: 11/03/15, #70 UDS: 11/03/15  Rx printed and forwarded to PCP for signature.

## 2015-12-03 NOTE — Telephone Encounter (Signed)
Rx faxed to pharmacy. Left detailed message on pt's cell# and to call if any questions.

## 2016-01-01 ENCOUNTER — Telehealth: Payer: Self-pay | Admitting: Family

## 2016-01-01 NOTE — Telephone Encounter (Signed)
Pt is requesting refill on Clonazepam.  Last OV: 11/03/2015 Last Fill: 12/03/2015 #70 and 0RF UDS: 04/18/2015 Moderate risk  Please advise.

## 2016-01-01 NOTE — Telephone Encounter (Signed)
°  Relation to [id number] Call back [place]  Reason for call: PT IS NEEDING RX clonazePAM (KLONOPIN) 0.5 MG tablet, PLEASE CALL TO LET PT KNOW WHEN AVAILABLE FOR PICK UP

## 2016-01-01 NOTE — Telephone Encounter (Signed)
Ok to send 70 tabs zero refill. Due for UDS please.

## 2016-01-02 MED ORDER — CLONAZEPAM 0.5 MG PO TABS: ORAL_TABLET | ORAL | 0 refills | Status: DC

## 2016-01-02 NOTE — Addendum Note (Signed)
Addended by: Kelle Darting A on: 01/02/2016 02:03 PM   Modules accepted: Orders

## 2016-01-02 NOTE — Telephone Encounter (Signed)
Rx faxed to pharmacy  

## 2016-01-02 NOTE — Telephone Encounter (Signed)
Rx printed and forwarded to PCP for signature. 

## 2016-01-02 NOTE — Telephone Encounter (Signed)
Left message on pt's voicemail that Rx has been faxed.

## 2016-01-13 ENCOUNTER — Other Ambulatory Visit: Payer: Self-pay | Admitting: Obstetrics and Gynecology

## 2016-01-13 DIAGNOSIS — N6310 Unspecified lump in the right breast, unspecified quadrant: Secondary | ICD-10-CM

## 2016-01-14 ENCOUNTER — Ambulatory Visit
Admission: RE | Admit: 2016-01-14 | Discharge: 2016-01-14 | Disposition: A | Discharge status: Home / Self Care | Payer: 59 | Source: Ambulatory Visit | Attending: Obstetrics and Gynecology | Admitting: Obstetrics and Gynecology

## 2016-01-14 DIAGNOSIS — N6310 Unspecified lump in the right breast, unspecified quadrant: Secondary | ICD-10-CM

## 2016-01-20 ENCOUNTER — Ambulatory Visit
Admission: RE | Admit: 2016-01-20 | Discharge: 2016-01-20 | Disposition: A | Discharge status: Home / Self Care | Payer: 59 | Source: Ambulatory Visit | Attending: Obstetrics and Gynecology | Admitting: Obstetrics and Gynecology

## 2016-01-20 ENCOUNTER — Other Ambulatory Visit: Payer: Self-pay | Admitting: Obstetrics and Gynecology

## 2016-01-20 DIAGNOSIS — N6310 Unspecified lump in the right breast, unspecified quadrant: Secondary | ICD-10-CM

## 2016-01-30 ENCOUNTER — Telehealth: Payer: Self-pay | Admitting: Family

## 2016-01-30 MED ORDER — CLONAZEPAM 0.5 MG PO TABS: ORAL_TABLET | ORAL | 0 refills | Status: DC

## 2016-01-30 NOTE — Telephone Encounter (Signed)
Patient is requesting a refill of clonazePAM (KLONOPIN) 0.5 MG tablet   Patient Relation: Self Patient phone: [phone] Pharmacy:  [facility], [address]

## 2016-01-30 NOTE — Telephone Encounter (Signed)
Last Rx:  01/02/16,  #70 UDS:  11/03/15 Next OV: 05/05/16  Rx printed and forwarded to PCP for signature.

## 2016-01-30 NOTE — Telephone Encounter (Signed)
Notified pt. 

## 2016-01-30 NOTE — Telephone Encounter (Signed)
Rx faxed to the pharmacy.

## 2016-02-12 ENCOUNTER — Encounter: Payer: 59 | Attending: Physical Medicine & Rehabilitation

## 2016-02-12 ENCOUNTER — Encounter: Payer: Self-pay | Admitting: Physical Medicine & Rehabilitation

## 2016-02-12 ENCOUNTER — Ambulatory Visit (HOSPITAL_BASED_OUTPATIENT_CLINIC_OR_DEPARTMENT_OTHER): Payer: 59 | Admitting: Physical Medicine & Rehabilitation

## 2016-02-12 VITALS — BP 115/79 | HR 63 | Resp 14

## 2016-02-12 DIAGNOSIS — Z833 Family history of diabetes mellitus: Secondary | ICD-10-CM | POA: Insufficient documentation

## 2016-02-12 DIAGNOSIS — Z87442 Personal history of urinary calculi: Secondary | ICD-10-CM | POA: Diagnosis not present

## 2016-02-12 DIAGNOSIS — M47816 Spondylosis without myelopathy or radiculopathy, lumbar region: Secondary | ICD-10-CM | POA: Diagnosis not present

## 2016-02-12 DIAGNOSIS — G43909 Migraine, unspecified, not intractable, without status migrainosus: Secondary | ICD-10-CM | POA: Insufficient documentation

## 2016-02-12 DIAGNOSIS — M47812 Spondylosis without myelopathy or radiculopathy, cervical region: Secondary | ICD-10-CM

## 2016-02-12 DIAGNOSIS — Z9851 Tubal ligation status: Secondary | ICD-10-CM | POA: Diagnosis not present

## 2016-02-12 DIAGNOSIS — Z9049 Acquired absence of other specified parts of digestive tract: Secondary | ICD-10-CM | POA: Insufficient documentation

## 2016-02-12 DIAGNOSIS — Z801 Family history of malignant neoplasm of trachea, bronchus and lung: Secondary | ICD-10-CM | POA: Diagnosis not present

## 2016-02-12 DIAGNOSIS — Z9889 Other specified postprocedural states: Secondary | ICD-10-CM | POA: Diagnosis not present

## 2016-02-12 DIAGNOSIS — Z8249 Family history of ischemic heart disease and other diseases of the circulatory system: Secondary | ICD-10-CM | POA: Insufficient documentation

## 2016-02-12 HISTORY — DX: Spondylosis without myelopathy or radiculopathy, cervical region: M47.812

## 2016-02-12 MED ORDER — TRAMADOL HCL 50 MG PO TABS: 100.0000 mg | ORAL_TABLET | Freq: Three times a day (TID) | ORAL | 5 refills | Status: DC

## 2016-02-12 NOTE — Progress Notes (Signed)
Subjective:    Patient ID: Shannon [name], female    DOB: March 06, 1961, 54 y.o.   MRN: [id number] Pain Inventory S/p L3,4,5 R MBB 10/14/15  Some increased neck pain which triggered migraine Has Right C4-5, C5-6 spondylosis  Average Pain 5 Pain Right Now 5 My pain is intermittent, sharp, burning and stabbing  In the last 24 hours, has pain interfered with the following? General activity 0 Relation with others 0 Enjoyment of life 0 What TIME of day is your pain at its worst? Daytime, night Sleep (in general) Fair  Pain is worse with: sitting, inactivity and some activites Pain improves with: therapy/exercise Relief from Meds: 7  Mobility walk without assistance ability to climb steps?  yes do you drive?  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  no  Physicians involved in your care Any changes since last visit?  no   Family History  Problem Relation Age of Onset  . Hypertension Mother   . Diabetes Father   . Hypertension Father   . Hypothyroidism Father   . Alcohol abuse Maternal Grandmother   . Cancer Paternal Grandfather     lung cancer  . Hashimoto's thyroiditis Sister   . Hypothyroidism Sister   . Hypothyroidism Other   . Neuropathy Sister     chronic inflammatory demyelinating peripheral neuropathy from lyme disease  . CAD Father    Social History   Social History  . Marital status: Married    Spouse name: N/A  . Number of children: 3  . Years of education: N/A   Occupational History  .  Self Employed   Social History Main Topics  . Smoking status: Never Smoker  . Smokeless tobacco: Never Used  . Alcohol use No  . Drug use:   . Sexual activity: Yes    Partners: Male    Birth control/ protection: Surgical   Other Topics Concern  . Not on file   Social History Narrative   Regular exercise:  Yes (swim instructor)   Caffeine Use:  1  daily   Married 3 children ages 25 son, 84 son, and 13 daughter   Associates degree               Past Surgical History:  Procedure Laterality Date  . APPENDECTOMY  1976  . CHOLECYSTECTOMY  08/2009   reports history of gallbladder polyps, she reports stents in duct of lushca   . HYSTEROSCOPY  07/2011  . LAMINECTOMY  1994   L4-5  . TONSILLECTOMY  1982  . TUBAL LIGATION     Past Medical History:  Diagnosis Date  . Anemia   . Anxiety   . Depression   . Eating disorder    anorexia nervosa in her 20's  . Fainting spell   . Hx of laminectomy 1994   L4-5  . Kidney stones    passed on their own  . Migraine   . Stomach disorder    due to complications with cholecystectomy "almost died"   There were no vitals taken for this visit.  Opioid Risk Score:   Fall Risk Score:  `1  Depression screen PHQ 2/9  Depression screen [facility] 2/9 07/15/2015 06/27/2015 06/19/2015  Decreased Interest 1 1 1   Down, Depressed, Hopeless 0 0 0  PHQ - 2 Score 1 1 1    HPI    Review of Systems  All other systems reviewed and  are negative.      Objective:   Physical Exam  Constitutional: She is oriented to person, place, and time. She appears well-developed and well-nourished.  HENT:  Head: Normocephalic and atraumatic.  Eyes: Conjunctivae and EOM are normal. Pupils are equal, round, and reactive to light.  Neck: Normal range of motion.  Musculoskeletal: Normal range of motion.  Neurological: She is alert and oriented to person, place, and time.  Reflex Scores:      Tricep reflexes are 2+ on the right side and 2+ on the left side.      Bicep reflexes are 2+ on the right side and 2+ on the left side.      Brachioradialis reflexes are 2+ on the right side and 2+ on the left side.      Patellar reflexes are 2+ on the right side and 2+ on the left side.      Achilles reflexes are 2+ on the right side and 2+ on the left side. Psychiatric: She has a normal mood and affect.  Nursing note and  vitals reviewed.  Patient has pain with lumbar extension as well as lumbar lateral bending toward the right but not with the left. Negative straight leg raising. Motor strength is 5/5 bilateral deltoid, biceps, triceps, grip, hip flexors, knee extensors, ankle flexion, plantar flexion She has some decrease in cervical extension as well as lateral bending. Cervical flexion is full. She does have positive Spurling's radiating to the right shoulder when tilting her head toward the right side  Sensation intact bilateral upper and lower limbs to light touch       Assessment & Plan:  1. Lumbar spondylosis improvements with lumbar medial branch blocks on the right side. She had around 4 months of relief. Pain is now returning. Would schedule for repeat injection in about 2 weeks  2. Cervical spondylosis without myelopathy. She does have some intermittent right C5 radiculopathy. When she compresses, right foramen. At this point, the neck pain is the main issue. She has no other neurologic signs. Would recommend C4, C5, C6 medial branch blocks on the right side. These will be done in around 6 weeks  Discussed with the patient agrees with plan. We'll continue tramadol current dosing

## 2016-02-12 NOTE — Patient Instructions (Signed)
Right L3,4,5 MBB repeat next visit

## 2016-03-01 ENCOUNTER — Other Ambulatory Visit: Payer: Self-pay | Admitting: Family

## 2016-03-01 MED ORDER — CLONAZEPAM 0.5 MG PO TABS: ORAL_TABLET | ORAL | 0 refills | Status: DC

## 2016-03-01 NOTE — Telephone Encounter (Signed)
Last Rx: 01/30/16, #70 UDS:  Moderate, 11/03/15 Next OV: 05/2016  Rx printed and forwarded to Dr Lorelei Pont (covering Provider) for signature.

## 2016-03-01 NOTE — Telephone Encounter (Signed)
Caller name: Relationship to patient: Self Can be reached: [phone]  Pharmacy:  [facility], [address] [phone] (Phone) [phone] (Fax)     Reason for call: Refill on clonazePAM (KLONOPIN) 0.5 MG tablet [id number]

## 2016-03-01 NOTE — Telephone Encounter (Signed)
Called in rx

## 2016-03-04 ENCOUNTER — Ambulatory Visit (HOSPITAL_BASED_OUTPATIENT_CLINIC_OR_DEPARTMENT_OTHER): Payer: 59 | Admitting: Physical Medicine & Rehabilitation

## 2016-03-04 ENCOUNTER — Encounter: Payer: 59 | Attending: Physical Medicine & Rehabilitation

## 2016-03-04 ENCOUNTER — Encounter: Payer: Self-pay | Admitting: Physical Medicine & Rehabilitation

## 2016-03-04 VITALS — BP 107/74 | HR 64 | Resp 14

## 2016-03-04 DIAGNOSIS — M47816 Spondylosis without myelopathy or radiculopathy, lumbar region: Secondary | ICD-10-CM | POA: Insufficient documentation

## 2016-03-04 DIAGNOSIS — M47812 Spondylosis without myelopathy or radiculopathy, cervical region: Secondary | ICD-10-CM | POA: Insufficient documentation

## 2016-03-04 DIAGNOSIS — Z801 Family history of malignant neoplasm of trachea, bronchus and lung: Secondary | ICD-10-CM | POA: Diagnosis not present

## 2016-03-04 DIAGNOSIS — Z87442 Personal history of urinary calculi: Secondary | ICD-10-CM | POA: Diagnosis not present

## 2016-03-04 DIAGNOSIS — Z8249 Family history of ischemic heart disease and other diseases of the circulatory system: Secondary | ICD-10-CM | POA: Diagnosis not present

## 2016-03-04 DIAGNOSIS — Z9049 Acquired absence of other specified parts of digestive tract: Secondary | ICD-10-CM | POA: Diagnosis not present

## 2016-03-04 DIAGNOSIS — G43909 Migraine, unspecified, not intractable, without status migrainosus: Secondary | ICD-10-CM | POA: Insufficient documentation

## 2016-03-04 DIAGNOSIS — Z833 Family history of diabetes mellitus: Secondary | ICD-10-CM | POA: Insufficient documentation

## 2016-03-04 DIAGNOSIS — Z9889 Other specified postprocedural states: Secondary | ICD-10-CM | POA: Insufficient documentation

## 2016-03-04 DIAGNOSIS — Z9851 Tubal ligation status: Secondary | ICD-10-CM | POA: Insufficient documentation

## 2016-03-04 NOTE — Progress Notes (Signed)
  PROCEDURE RECORD [facility]   Name: Shannon [name] DOB:[birth year] MRN: [id number]  Date:03/04/2016  Physician: Alysia Penna, MD    Nurse/CMA: Dacen Frayre, CMA  Allergies:  Allergies  Allergen Reactions  . Buspar [Buspirone]     constipation  . Eszopiclone     Headaches  . Flexeril [Cyclobenzaprine Hcl] Other (See Comments)    Mood swings  . Gabapentin     sedation  . Morphine And Related Other (See Comments)    No relief  . Nsaids Other (See Comments)    GI bleed  . Paroxetine Nausea Only       . Shellfish Allergy Other (See Comments)    skin & mouth tingle  . Tizanidine Hcl Other (See Comments)    Dizziness, mood swings    Consent Signed: Yes.    Is patient diabetic? No.  CBG today? N/A  Pregnant: No. LMP: No LMP recorded. (age 26-55)  Anticoagulants: no Anti-inflammatory: no Antibiotics: no  Procedure: right L3,4,5 medial branch block   Position: Prone Start Time: 1:00pm  End Time: 1:08         Fluoro Time: 20  RN/CMA Lyriq Jarchow, CMA Kabir Brannock, CMA    Time 12:50pm 1:11pm    BP 107/74 106/73    Pulse 63 56    Respirations 14 14    O2 Sat 98 98    S/S 6 6    Pain Level 7/10 7/10     D/C home with husband patient A & O X 3, D/C instructions reviewed, and sits independently.

## 2016-03-04 NOTE — Patient Instructions (Signed)

## 2016-03-05 ENCOUNTER — Ambulatory Visit: Payer: Self-pay | Admitting: Physical Medicine & Rehabilitation

## 2016-03-22 ENCOUNTER — Encounter: Payer: Self-pay | Admitting: Physical Medicine & Rehabilitation

## 2016-03-22 ENCOUNTER — Ambulatory Visit (HOSPITAL_BASED_OUTPATIENT_CLINIC_OR_DEPARTMENT_OTHER): Payer: 59 | Admitting: Physical Medicine & Rehabilitation

## 2016-03-22 VITALS — BP 115/74 | HR 66 | Resp 14

## 2016-03-22 DIAGNOSIS — M47812 Spondylosis without myelopathy or radiculopathy, cervical region: Secondary | ICD-10-CM

## 2016-03-22 DIAGNOSIS — M47816 Spondylosis without myelopathy or radiculopathy, lumbar region: Secondary | ICD-10-CM | POA: Diagnosis not present

## 2016-03-22 NOTE — Progress Notes (Signed)
Right C4,5,6 Cervical medial branch blocks under fluoroscopic guidance  Indication: Cervical axial pain without radiculitis. Pain is unresponsive to conservative care and interferes with activities of daily living.  Informed consent was obtained after describing risks and benefits of the procedure with the patient this includes bleeding bruising and infection temporary or permanent paralysis. Patient elected to proceed and has given written consent. Patient placed in a lateral decubitus position. Fluoroscopic was suggested to insure adequate view of the articular pillars in a lateral view. Area was marked and prepped with Betadine. 1/2 cc of 1% lidocaine injected into the skin and subcutaneous tissue using a 27-gauge 0.5 inch needle. Then a 25-gauge 1.5 inch needle was inserted under fluoroscopic guidance first targeting the C4 articular pillar center. Bone contact made and confirmed under AP imaging. Then 0.5 mL of 1% MPF lidocaine injected. This same procedure was repeated at C5 and C6 using same technique. Patient tolerated the procedure well. Post procedure instructions given. Adequate postoperative monitoring was performed. See flowsheet

## 2016-03-22 NOTE — Progress Notes (Signed)
  [facility]   Name: Peter Liverman DOB:03/18/1961 MRN: [id number]  Date:03/22/2016  Physician: Alysia Penna, MD    Nurse/CMA: Yolanda Bonine  Allergies:  Allergies  Allergen Reactions  . Buspar [Buspirone]     constipation  . Eszopiclone     Headaches  . Flexeril [Cyclobenzaprine Hcl] Other (See Comments)    Mood swings  . Gabapentin     sedation  . Morphine And Related Other (See Comments)    No relief  . Nsaids Other (See Comments)    GI bleed  . Paroxetine Nausea Only       . Shellfish Allergy Other (See Comments)    skin & mouth tingle  . Tizanidine Hcl Other (See Comments)    Dizziness, mood swings    Consent Signed: Yes.    Is patient diabetic? No.  CBG today?   Pregnant: No. LMP: No LMP recorded. (age 19-55)  Anticoagulants: no Anti-inflammatory: no Antibiotics: no  Procedure: Right medial branch block cervical  Position: Prone Start Time: 926 End Time:936  Fluoro Time:  RN/CMA Janet Berlin    Time 0912 941    BP 115/74 124/68    Pulse 66 63    Respirations 14 14    O2 Sat 98 99    S/S 6 6    Pain Level 8 5     D/C home with Husband, patient A & O X 3, D/C instructions reviewed, and sits independently.

## 2016-03-22 NOTE — Patient Instructions (Addendum)
You received right cervical medial branch blocks at C4, C5, C6. This is for neck pain on the right side. May have some numbness in the neck or right arm, which should be temporary.  Please call if any weakness develops.   Trochanteric Bursitis Rehab Ask your health care provider which exercises are safe for you. Do exercises exactly as told by your health care provider and adjust them as directed. It is normal to feel mild stretching, pulling, tightness, or discomfort as you do these exercises, but you should stop right away if you feel sudden pain or your pain gets worse.Do not begin these exercises until told by your health care provider. Stretching exercises These exercises warm up your muscles and joints and improve the movement and flexibility of your hip. These exercises also help to relieve pain and stiffness. Exercise A: Iliotibial band stretch 1. Lie on your side with your left / right leg in the top position. 2. Bend your left / right knee and grab your ankle. 3. Slowly bring your knee back so your thigh is behind your body. 4. Slowly lower your knee toward the floor until you feel a gentle stretch on the outside of your left / right thigh. If you do not feel a stretch and your knee will not fall farther, place the heel of your other foot on top of your outer knee and pull your thigh down farther. 5. Hold this position for __________ seconds. 6. Slowly return to the starting position. Repeat __________ times. Complete this exercise __________ times a day. Strengthening exercises These exercises build strength and endurance in your hip and pelvis. Endurance is the ability to use your muscles for a long time, even after they get tired. Exercise B: Bridge (hip extensors) 1. Lie on your back on a firm surface with your knees bent and your feet flat on the floor. 2. Tighten your buttocks muscles and lift your buttocks off the floor until your trunk is level with your thighs. You should feel  the muscles working in your buttocks and the back of your thighs. If this exercise is too easy, try doing it with your arms crossed over your chest. 3. Hold this position for __________ seconds. 4. Slowly return to the starting position. 5. Let your muscles relax completely between repetitions. Repeat __________ times. Complete this exercise __________ times a day. Exercise C: Squats (knee extensors and  quadriceps) 1. Stand in front of a table, with your feet and knees pointing straight ahead. You may rest your hands on the table for balance but not for support. 2. Slowly bend your knees and lower your hips like you are going to sit in a chair.  Keep your weight over your heels, not over your toes.  Keep your lower legs upright so they are parallel with the table legs.  Do not let your hips go lower than your knees.  Do not bend lower than told by your health care provider.  If your hip pain increases, do not bend as low. 3. Hold this position for __________ seconds. 4. Slowly push with your legs to return to standing. Do not use your hands to pull yourself to standing. Repeat __________ times. Complete this exercise __________ times a day. Exercise D: Hip hike 1. Stand sideways on a bottom step. Stand on your left / right leg with your other foot unsupported next to the step. You can hold onto the railing or wall if needed for balance. 2. Keeping your knees  straight and your torso square, lift your left / right hip up toward the ceiling. 3. Hold this position for __________ seconds. 4. Slowly let your left / right hip lower toward the floor, past the starting position. Your foot should get closer to the floor. Do not lean or bend your knees. Repeat __________ times. Complete this exercise __________ times a day. Exercise E: Single leg stand 1. Stand near a counter or door frame that you can hold onto for balance as needed. It is helpful to stand in front of a mirror for this exercise so  you can watch your hip. 2. Squeeze your left / right buttock muscles then lift up your other foot. Do not let your left / right hip push out to the side. 3. Hold this position for __________ seconds. Repeat __________ times. Complete this exercise __________ times a day. This information is not intended to replace advice given to you by your health care provider. Make sure you discuss any questions you have with your health care provider. Document Released: 05/27/2004 Document Revised: 12/25/2015 Document Reviewed: 04/04/2015 Elsevier Interactive Patient Education  2017 [employer].

## 2016-03-31 ENCOUNTER — Telehealth: Payer: Self-pay | Admitting: Family

## 2016-03-31 MED ORDER — CLONAZEPAM 0.5 MG PO TABS: ORAL_TABLET | ORAL | 0 refills | Status: DC

## 2016-03-31 NOTE — Telephone Encounter (Signed)
Relation to [id number]  Call back [facility]: [address], [address]  Reason for call:  Patient requesting a refill clonazePAM (KLONOPIN) 0.5 MG tablet

## 2016-03-31 NOTE — Telephone Encounter (Signed)
Last RX: 03/01/16, #70 Last OV: 11/03/15 Next OV: 05/05/16 UDS: 11/03/15  Rx printed and forwarded to PCP for signature. Notified pt that PCP is out of the office this afternoon and tomorrow and will be Friday before Rx can be signed. Pt voices understanding.

## 2016-04-02 NOTE — Telephone Encounter (Signed)
Left message re: Rx completion on pt's voicemail.

## 2016-04-02 NOTE — Telephone Encounter (Signed)
Faxed to pharmacy at 8:14am.

## 2016-04-29 ENCOUNTER — Telehealth: Payer: Self-pay | Admitting: Family

## 2016-04-29 NOTE — Telephone Encounter (Signed)
Patient is requesting a refill of clonazePAM (KLONOPIN) 0.5 MG tablet Please advise   Pharmacy: [facility] [place], [address]

## 2016-04-30 MED ORDER — CLONAZEPAM 0.5 MG PO TABS: ORAL_TABLET | ORAL | 0 refills | Status: DC

## 2016-04-30 NOTE — Telephone Encounter (Signed)
OK to phone in.

## 2016-04-30 NOTE — Telephone Encounter (Signed)
Phoned in to [facility] Clonazepam #70 with 0 refills.

## 2016-04-30 NOTE — Addendum Note (Signed)
Addended by: Sharon Seller B on: 04/30/2016 04:31 PM   Modules accepted: Orders

## 2016-04-30 NOTE — Telephone Encounter (Signed)
Requesting:  clonazepam Contract   Signed on 12/23/2014 UDS   Moderate--next one is due 07/17/2015 Last OV    11/03/2015 Last Refill    #70 with 0 refills on 11/03/2015  Please Advise

## 2016-05-04 ENCOUNTER — Ambulatory Visit: Payer: 59 | Admitting: Physical Medicine & Rehabilitation

## 2016-05-05 ENCOUNTER — Ambulatory Visit (INDEPENDENT_AMBULATORY_CARE_PROVIDER_SITE_OTHER): Payer: 59 | Admitting: Family

## 2016-05-05 VITALS — BP 102/65 | Temp 98.3°F | Resp 16 | Ht 66.0 in | Wt 125.6 lb

## 2016-05-05 DIAGNOSIS — F419 Anxiety disorder, unspecified: Secondary | ICD-10-CM

## 2016-05-05 DIAGNOSIS — R001 Bradycardia, unspecified: Secondary | ICD-10-CM | POA: Diagnosis not present

## 2016-05-05 DIAGNOSIS — F418 Other specified anxiety disorders: Secondary | ICD-10-CM | POA: Diagnosis not present

## 2016-05-05 DIAGNOSIS — F32A Depression, unspecified: Secondary | ICD-10-CM

## 2016-05-05 DIAGNOSIS — F329 Major depressive disorder, single episode, unspecified: Secondary | ICD-10-CM

## 2016-05-05 LAB — BASIC METABOLIC PANEL
BUN: 12 mg/dL (ref 6–23)
CALCIUM: 8.7 mg/dL (ref 8.4–10.5)
CHLORIDE: 102 meq/L (ref 96–112)
CO2: 31 mEq/L (ref 19–32)
CREATININE: 0.82 mg/dL (ref 0.40–1.20)
GFR: 76.99 mL/min (ref >60.00)
Glucose, Bld: 98 mg/dL (ref 70–99)
Potassium: 3.8 mEq/L (ref 3.5–5.1)
Sodium: 138 mEq/L (ref 135–145)

## 2016-05-05 LAB — TSH: TSH: 1.33 u[IU]/mL (ref 0.35–4.50)

## 2016-05-05 NOTE — Progress Notes (Signed)
Subjective:    Patient ID: Shannon [name], female    DOB: [birth year], 56 y.o.   MRN: [id number]  HPI  Shannon [name] is a 56 yr old female who presents today for follow up.  1) Depression/anxiety-  Patient is maintained on prn klonopin.  She reports mood is good.  Has had a bit more anxiety this month. Had one episode when she was in a movie theater and experienced claustrophobia.   2) bradycardia-  Has an Sport and exercise psychologist.  Notes that she developed dizziness at church-  Heart rate was 43. Has ha a few times when she had HR down into the mid 30's- felt tired but did not have SOB or dizziness. Of note, pt had a neg nuclear stress back in 2013 for chest pain and abnormal EKG.     Review of Systems See HPI  Past Medical History:  Diagnosis Date  . Anemia   . Anxiety   . Depression   . Eating disorder    anorexia nervosa in her 20's  . Fainting spell   . Hx of laminectomy 1994   L4-5  . Kidney stones    passed on their own  . Migraine   . Stomach disorder    due to complications with cholecystectomy "almost died"     Social History   Social History  . Marital status: Married    Spouse name: N/A  . Number of children: 3  . Years of education: N/A   Occupational History  .  Self Employed   Social History Main Topics  . Smoking status: Never Smoker  . Smokeless tobacco: Never Used  . Alcohol use No  . Drug use:   . Sexual activity: Yes    Partners: Male    Birth control/ protection: Surgical   Other Topics Concern  . Not on file   Social History Narrative   Regular exercise:  Yes (swim instructor)   Caffeine Use:  1 daily   Married 3 children ages 38 son, 51 son, and 85 daughter   Associates degree                Past Surgical History:  Procedure Laterality Date  . APPENDECTOMY  1976  . CHOLECYSTECTOMY  08/2009   reports history of gallbladder polyps, she reports stents in duct of lushca   . HYSTEROSCOPY  07/2011  . LAMINECTOMY  1994   L4-5  . TONSILLECTOMY   1982  . TUBAL LIGATION      Family History  Problem Relation Age of Onset  . Hypertension Mother   . Diabetes Father   . Hypertension Father   . Hypothyroidism Father   . CAD Father   . Alcohol abuse Maternal Grandmother   . Cancer Paternal Grandfather     lung cancer  . Hypothyroidism Other   . Hashimoto's thyroiditis Sister   . Hypothyroidism Sister   . Neuropathy Sister     chronic inflammatory demyelinating peripheral neuropathy from lyme disease    Allergies  Allergen Reactions  . Buspar [Buspirone]     constipation  . Eszopiclone     Headaches  . Flexeril [Cyclobenzaprine Hcl] Other (See Comments)    Mood swings  . Gabapentin     sedation  . Morphine And Related Other (See Comments)    No relief  . Nsaids Other (See Comments)    GI bleed  . Paroxetine Nausea Only       . Shellfish Allergy Other (See Comments)  skin & mouth tingle  . Tizanidine Hcl Other (See Comments)    Dizziness, mood swings    Current Outpatient Prescriptions on File Prior to Visit  Medication Sig Dispense Refill  . clonazePAM (KLONOPIN) 0.5 MG tablet TAKE ONE TABLET THREE TIMES A DAY AS NEEDED FOR ANXIETY 70 tablet 0  . traMADol (ULTRAM) 50 MG tablet Take 2 tablets (100 mg total) by mouth 3 (three) times daily. 180 tablet 5   No current facility-administered medications on file prior to visit.     BP 102/65 (BP Location: Right Arm, Cuff Size: Normal)   Temp 98.3 F (36.8 C) (Oral)   Resp 16   Ht 5\' 6"  (1.676 m)   Wt 125 lb 9.6 oz (57 kg)   LMP 05/04/2016   SpO2 99% Comment: room air  BMI 20.27 kg/m       Objective:   Physical Exam  Constitutional: She is oriented to person, place, and time. She appears well-developed and well-nourished.  Cardiovascular: Normal rate, regular rhythm and normal heart sounds.   No murmur heard. Pulmonary/Chest: Effort normal and breath sounds normal. No respiratory distress. She has no wheezes.  Musculoskeletal: She exhibits no edema.    Neurological: She is alert and oriented to person, place, and time.  Psychiatric: She has a normal mood and affect. Her behavior is normal. Judgment and thought content normal.          Assessment & Plan:  Bradycardia- she is an avid swimmer and has been asymptomatic except for one episode of brief dizziness.  I suspect that her bradycardia is secondary to her cardiovascular fitness.  We did discuss referral to cardiology, but she declines at this time- she is agreeable to holter monitor.  EKG is performed today in the office and personally examined.  Notes neg precordial T waves. This is compared to EKG back in 2013 and appears unchanged. She had neg stress at that time.

## 2016-05-05 NOTE — Progress Notes (Signed)
Pre visit review using our clinic review tool, if applicable. No additional management support is needed unless otherwise documented below in the visit note. 

## 2016-05-05 NOTE — Assessment & Plan Note (Signed)
Stable with use of prn klonopin, continue same.

## 2016-05-05 NOTE — Patient Instructions (Signed)
Please complete lab work prior to leaving. You will be contacted about your heart monitor. Call if you develop recurrent low heart rate with symptoms such as dizziness/shortness of breath.

## 2016-05-06 ENCOUNTER — Encounter: Payer: Self-pay | Admitting: Family

## 2016-05-14 ENCOUNTER — Encounter: Payer: Self-pay | Admitting: Family

## 2016-05-14 DIAGNOSIS — Z79899 Other long term (current) drug therapy: Secondary | ICD-10-CM | POA: Diagnosis not present

## 2016-05-18 DIAGNOSIS — H2513 Age-related nuclear cataract, bilateral: Secondary | ICD-10-CM | POA: Diagnosis not present

## 2016-05-18 DIAGNOSIS — H43313 Vitreous membranes and strands, bilateral: Secondary | ICD-10-CM | POA: Diagnosis not present

## 2016-05-18 DIAGNOSIS — H20012 Primary iridocyclitis, left eye: Secondary | ICD-10-CM | POA: Diagnosis not present

## 2016-05-31 ENCOUNTER — Telehealth: Payer: Self-pay | Admitting: Family

## 2016-05-31 MED ORDER — CLONAZEPAM 0.5 MG PO TABS: ORAL_TABLET | ORAL | 0 refills | Status: DC

## 2016-05-31 NOTE — Telephone Encounter (Signed)
Last OV: 05/05/16 Next OV: due 08/03/16 UDS: 05/13/16, moderate risk.  Rx printed and forwarded to PCP for signature.

## 2016-05-31 NOTE — Telephone Encounter (Signed)
°

## 2016-05-31 NOTE — Telephone Encounter (Signed)
Rx faxed to pharmacy and pt has been notified.

## 2016-06-07 ENCOUNTER — Telehealth: Payer: Self-pay | Admitting: Family

## 2016-06-07 DIAGNOSIS — R001 Bradycardia, unspecified: Secondary | ICD-10-CM

## 2016-06-07 NOTE — Telephone Encounter (Signed)
Caller name: Relationship to patient: Self Can be reached: [phone]  Pharmacy:  Reason for call: Patient request to have Holter Monitor done by Dr. Carlis Stable w/[facility]. Request order be faxed to [phone]

## 2016-06-07 NOTE — Telephone Encounter (Signed)
Order and office notes faxed, awaiting appt

## 2016-06-07 NOTE — Telephone Encounter (Signed)
Anderson Malta, can you please arrange? Thank you.

## 2016-06-07 NOTE — Telephone Encounter (Signed)
Please let pt know that she will need to see cardiology for a consult first. If she completes in [place] she can just complete the test without additional consult prior. Please let me know her preference.

## 2016-06-07 NOTE — Telephone Encounter (Signed)
Will need referral placed for Cardiology consults per [facility]

## 2016-06-07 NOTE — Telephone Encounter (Signed)
Spoke with pt. She states Dr Edd Arbour is her spouse's cardiologist and he has an office at [facility] in [place]. She states she has already spoken with them and they told her we just need to fax them an order. Spoke with [place] in the [place] at [facility] [phone]). She states the office at [facility] is not fully operational and they do not place holter monitors. She did confirm that we can fax the order to [phone] and they will contact pt to schedule holter placement. Notified pt and she still wants to proceed with [facility].  Please advise?

## 2016-06-24 ENCOUNTER — Ambulatory Visit (HOSPITAL_BASED_OUTPATIENT_CLINIC_OR_DEPARTMENT_OTHER): Payer: 59 | Admitting: Physical Medicine & Rehabilitation

## 2016-06-24 ENCOUNTER — Encounter: Payer: 59 | Attending: Physical Medicine & Rehabilitation

## 2016-06-24 ENCOUNTER — Encounter: Payer: Self-pay | Admitting: Physical Medicine & Rehabilitation

## 2016-06-24 VITALS — BP 100/70 | HR 79 | Resp 14

## 2016-06-24 DIAGNOSIS — M47816 Spondylosis without myelopathy or radiculopathy, lumbar region: Secondary | ICD-10-CM

## 2016-06-24 NOTE — Patient Instructions (Signed)

## 2016-06-24 NOTE — Progress Notes (Signed)
Right lumbar L3, L4 medial branch blocks and L5 dorsal ramus injection under fluoroscopic guidance  Indication: Right Lumbar pain which is not relieved by medication management or other conservative care and interfering with self-care and mobility.  Informed consent was obtained after describing risks and benefits of the procedure with the patient, this includes bleeding, bruising, infection, paralysis and medication side effects. The patient wishes to proceed and has given written consent. The patient was placed in a prone position. The lumbar area was marked and prepped with Betadine. One ML of 1% lidocaine was injected into each of 3 areas into the skin and subcutaneous tissue. Then a 22-gauge 3.5 in spinal needle was inserted targeting the junction of the Right S1 superior articular process and sacral ala junction. Needle was advanced under fluoroscopic guidance. Bone contact was made.Isovue 200 was injected x0.5 mL demonstrating no intravascular uptake. Then a solution containing 2% MPF lidocaine was injected x0.5 mL. Then the Right L5 superior articular process in transverse process junction was targeted. Bone contact was made.Isovue 200 was injected x0.5 mL demonstrating no intravascular uptake. Then a solution containing 2% MPF lidocaine was injected x0.5 mL. Then the Right L4 superior articular process in transverse process junction was targeted. Bone contact was made. Isovue 200 was injected x0.5 mL demonstrating no intravascular uptake. Then a solution containing2% MPF lidocaine was injected x0.5 mL Patient tolerated procedure well. Post procedure instructions were given. Please refer to post procedure form. 

## 2016-06-24 NOTE — Progress Notes (Signed)
  PROCEDURE RECORD [facility]   Name: Afiyah Rachford DOB:[birth year] MRN: [id number]  Date:06/24/2016  Physician: Alysia Penna, MD    Nurse/CMA: Masiah Woody, CMA  Allergies:  Allergies  Allergen Reactions  . Buspar [Buspirone]     constipation  . Eszopiclone     Headaches  . Flexeril [Cyclobenzaprine Hcl] Other (See Comments)    Mood swings  . Gabapentin     sedation  . Morphine And Related Other (See Comments)    No relief  . Nsaids Other (See Comments)    GI bleed  . Paroxetine Nausea Only       . Shellfish Allergy Other (See Comments)    skin & mouth tingle  . Tizanidine Hcl Other (See Comments)    Dizziness, mood swings    Consent Signed: Yes.    Is patient diabetic? No.  CBG today?   Pregnant: No. LMP: No LMP recorded. (age 55-55)  Anticoagulants: no Anti-inflammatory: no Antibiotics: no  Procedure: right L3, 4, 5 medial branch block  Position: Prone Start Time: 11:09 am  End Time: 11:17am  Fluoro Time: 28  RN/CMA Ahmadou Bolz, CMA Marranda Arakelian, CMA    Time 10:50am 11:20 am    BP 100/70 110/74    Pulse 79 86    Respirations 14 14    O2 Sat 98 98    S/S 6 6    Pain Level 8/10 6/10     D/C home with husband, patient A & O X 3, D/C instructions reviewed, and sits independently.

## 2016-06-30 ENCOUNTER — Other Ambulatory Visit: Payer: Self-pay | Admitting: Family

## 2016-06-30 NOTE — Telephone Encounter (Signed)
Last RX: 06/01/15, #70 Last OV: 05/05/16 Next OV: due 08/03/16 UDS: 05/13/16 moderate, due 08/11/16.  Please advise?

## 2016-06-30 NOTE — Telephone Encounter (Signed)
Caller name: Relationship to patient:Self Can be reached: [phone]  Pharmacy:  [place], [address] [phone] (Phone) [phone] (Fax     Reason for call: Refill clonazePAM (KLONOPIN) 0.5 MG tablet

## 2016-06-30 NOTE — Telephone Encounter (Signed)
Ok to send in #70 no refills.

## 2016-07-01 MED ORDER — CLONAZEPAM 0.5 MG PO TABS: ORAL_TABLET | ORAL | 0 refills | Status: DC

## 2016-07-01 NOTE — Telephone Encounter (Signed)
Rx phoned in to [facility].

## 2016-07-08 ENCOUNTER — Telehealth: Payer: Self-pay | Admitting: Physical Medicine & Rehabilitation

## 2016-07-08 DIAGNOSIS — M25551 Pain in right hip: Secondary | ICD-10-CM

## 2016-07-08 NOTE — Telephone Encounter (Signed)
Contacted pt, left a message to have her call us back for more detail regarding injury

## 2016-07-08 NOTE — Telephone Encounter (Signed)
Patient calls and states she fell on pool deck pain up and down leg tender - wants to know if [facility] should see her or should he give referral to ortho - to clinical in basket

## 2016-07-09 ENCOUNTER — Ambulatory Visit: Payer: Self-pay | Admitting: Family

## 2016-07-09 NOTE — Telephone Encounter (Signed)
Patient had a hard fall on her pool deck.  She landed on her right hip and has significant bruising on the right hip and back.  The pain is described as constant and throbbing with a pain scale rating of 8. She cannot bear weight.  She is asking  for a referral to ortho or imaging to see  what damage may have occurred. Should she come see Dr. Letta Pate first?

## 2016-07-09 NOTE — Telephone Encounter (Signed)
Consulted Dr. Letta Pate,  Hip x-ray ordered, patient notified

## 2016-07-12 ENCOUNTER — Ambulatory Visit (HOSPITAL_BASED_OUTPATIENT_CLINIC_OR_DEPARTMENT_OTHER)
Admission: RE | Admit: 2016-07-12 | Discharge: 2016-07-12 | Disposition: A | Discharge status: Home / Self Care | Payer: 59 | Source: Ambulatory Visit | Attending: Physical Medicine & Rehabilitation | Admitting: Physical Medicine & Rehabilitation

## 2016-07-12 ENCOUNTER — Telehealth: Payer: Self-pay | Admitting: Physical Medicine & Rehabilitation

## 2016-07-12 DIAGNOSIS — M47896 Other spondylosis, lumbar region: Secondary | ICD-10-CM | POA: Diagnosis not present

## 2016-07-12 DIAGNOSIS — M25551 Pain in right hip: Secondary | ICD-10-CM | POA: Insufficient documentation

## 2016-07-12 DIAGNOSIS — S79911A Unspecified injury of right hip, initial encounter: Secondary | ICD-10-CM | POA: Diagnosis not present

## 2016-07-12 NOTE — Telephone Encounter (Signed)
There is no evidence of fracture and only mild arthritis. We can do an injection in the office if she wishes. No urgent ortho consult is needed  If she prefers to follow up with orthopedics may recommend Alphonzo Severance from [facility]

## 2016-07-12 NOTE — Telephone Encounter (Signed)
Patient called had xray this morning - would like for you to review results and then recommend Orthopedic - she does not have one

## 2016-07-13 ENCOUNTER — Encounter (INDEPENDENT_AMBULATORY_CARE_PROVIDER_SITE_OTHER): Payer: Self-pay | Admitting: Orthopaedic Surgery

## 2016-07-13 ENCOUNTER — Ambulatory Visit (INDEPENDENT_AMBULATORY_CARE_PROVIDER_SITE_OTHER): Payer: 59 | Admitting: Orthopaedic Surgery

## 2016-07-13 ENCOUNTER — Ambulatory Visit (INDEPENDENT_AMBULATORY_CARE_PROVIDER_SITE_OTHER): Payer: 59

## 2016-07-13 DIAGNOSIS — M25561 Pain in right knee: Secondary | ICD-10-CM

## 2016-07-13 DIAGNOSIS — M79671 Pain in right foot: Secondary | ICD-10-CM | POA: Diagnosis not present

## 2016-07-13 MED ORDER — HYDROCODONE-ACETAMINOPHEN 5-325 MG PO TABS: 1.0000 | ORAL_TABLET | Freq: Four times a day (QID) | ORAL | 0 refills | Status: DC | PRN

## 2016-07-13 MED ORDER — DIAZEPAM 5 MG PO TABS: 5.0000 mg | ORAL_TABLET | Freq: Four times a day (QID) | ORAL | 0 refills | Status: DC | PRN

## 2016-07-13 NOTE — Progress Notes (Signed)
Office Visit Note   Patient: Shannon [name]           Date of Birth: 11-07-1960           MRN: [id number] Visit Date: 07/13/2016              Requested by: [name], NP [address] [place], [place] [address] PCP: [name]., NP   Assessment & Plan: Visit Diagnoses:  1. Acute pain of right knee   2. Pain in right foot     Plan: Impression is that patient has exacerbation of her lumbar radiculopathy which she is had issues with in the past. I recommend Valium as a muscle relaxer and Norco. She has lots of allergies and adverse reactions to medicines including prednisone and Medrol Dosepak. If not better should follow up with Dr. Dianna Limbo for her lumbar radiculopathy.  Follow-Up Instructions: Return if symptoms worsen or fail to improve.   Orders:  Orders Placed This Encounter  Procedures  . XR KNEE 3 VIEW RIGHT  . XR Foot Complete Right   Meds ordered this encounter  Medications  . diazepam (VALIUM) 5 MG tablet    Sig: Take 1-2 tablets (5-10 mg total) by mouth every 6 (six) hours as needed for muscle spasms.    Dispense:  60 tablet    Refill:  0  . HYDROcodone-acetaminophen (NORCO) 5-325 MG tablet    Sig: Take 1-2 tablets by mouth every 6 (six) hours as needed.    Dispense:  90 tablet    Refill:  0      Procedures: No procedures performed   Clinical Data: No additional findings.   Subjective: Chief Complaint  Patient presents with  . Right Hip - Pain  . Right Knee - Pain  . Right Foot - Pain    Patient is a 56 year old female who comes in with right low back hip thigh knee and calf ankle and foot pain status post syncopal fall a week ago. Hip x-rays were taken yesterday. She takes tramadol. She has lots of allergies. She endorses pain that radiates from her lower back down to her foot. She endorses occasional numbness but denies focal weakness. She denies any incontinence.    Review of Systems  Constitutional:  Negative.   HENT: Negative.   Eyes: Negative.   Respiratory: Negative.   Cardiovascular: Negative.   Endocrine: Negative.   Musculoskeletal: Negative.   Neurological: Negative.   Hematological: Negative.   Psychiatric/Behavioral: Negative.   All other systems reviewed and are negative.    Objective: Vital Signs: LMP 06/18/2016   Physical Exam  Constitutional: She is oriented to person, place, and time. She appears well-developed and well-nourished.  HENT:  Head: Normocephalic and atraumatic.  Eyes: EOM are normal.  Neck: Neck supple.  Pulmonary/Chest: Effort normal.  Abdominal: Soft.  Neurological: She is alert and oriented to person, place, and time.  Skin: Skin is warm. Capillary refill takes less than 2 seconds.  Psychiatric: She has a normal mood and affect. Her behavior is normal. Judgment and thought content normal.  Nursing note and vitals reviewed.   Ortho Exam Exam of the right lower extremity shows tenderness throughout buttock and hip and thigh and knee and ankle. I do not appreciate any worrisome features. Her reflexes are slightly hyperreflexic bilaterally. No beats of clonus. No numbness. Specialty Comments:  No specialty comments available.  Imaging: Xr Foot Complete Right  Result Date: 07/13/2016 Bunion deformity without acute findings  Xr  Knee 3 View Right  Result Date: 07/13/2016 No acute findings    PMFS History: Patient Active Problem List   Diagnosis Date Noted  . Cervical spondylosis without myelopathy 02/12/2016  . Spondylosis of lumbar region without myelopathy or radiculopathy 07/15/2015  . Chronic midline posterior neck pain 06/19/2015  . Sacroiliac (ligament) sprain 06/19/2015  . Lumbar radicular pain 06/19/2015  . Postlaminectomy syndrome, lumbar region 06/19/2015  . Rhus dermatitis 10/25/2014  . Raynaud's disease 03/23/2013  . Memory loss 02/19/2013  . Major depression 12/23/2012  . Anxiety state 12/23/2012  . Unspecified  constipation 08/23/2012  . Migraine 02/23/2012  . Routine general medical examination at a health care facility 02/15/2012  . Abnormal EKG 02/15/2012  . Chronic pain 02/17/2011  . ASCUS on Pap smear 02/16/2011  . Anxiety and depression 12/30/2010   Past Medical History:  Diagnosis Date  . Anemia   . Anxiety   . Depression   . Eating disorder    anorexia nervosa in her 20's  . Fainting spell   . Hx of laminectomy 1994   L4-5  . Kidney stones    passed on their own  . Migraine   . Stomach disorder    due to complications with cholecystectomy "almost died"    Family History  Problem Relation Age of Onset  . Hypertension Mother   . Diabetes Father   . Hypertension Father   . Hypothyroidism Father   . CAD Father   . Alcohol abuse Maternal Grandmother   . Cancer Paternal Grandfather     lung cancer  . Hypothyroidism Other   . Hashimoto's thyroiditis Sister   . Hypothyroidism Sister   . Neuropathy Sister     chronic inflammatory demyelinating peripheral neuropathy from lyme disease    Past Surgical History:  Procedure Laterality Date  . APPENDECTOMY  1976  . CHOLECYSTECTOMY  08/2009   reports history of gallbladder polyps, she reports stents in duct of lushca   . HYSTEROSCOPY  07/2011  . LAMINECTOMY  1994   L4-5  . TONSILLECTOMY  1982  . TUBAL LIGATION     Social History   Occupational History  .  Self Employed   Social History Main Topics  . Smoking status: Never Smoker  . Smokeless tobacco: Never Used  . Alcohol use No  . Drug use: Yes  . Sexual activity: Yes    Partners: Male    Birth control/ protection: Surgical

## 2016-07-13 NOTE — Telephone Encounter (Signed)
Left message to call office to discuss Dr Letta Pate reply.

## 2016-07-14 ENCOUNTER — Telehealth: Payer: Self-pay | Admitting: *Deleted

## 2016-07-14 ENCOUNTER — Encounter: Payer: Self-pay | Admitting: Family

## 2016-07-14 NOTE — Telephone Encounter (Signed)
I need to see patient in the office for evaluation first.  I will assess her and make formal referral to cardiology at that time and then cardiology can order appropriate cardiac testing.

## 2016-07-14 NOTE — Telephone Encounter (Signed)
error:[id number] ° °

## 2016-07-14 NOTE — Telephone Encounter (Signed)
Notified pt of below and scheduled her an appointment with PCP on Friday at 11:15am.

## 2016-07-14 NOTE — Telephone Encounter (Signed)
Received call from pt she was never hooked up with the holter monitor that PCP had recommended. Had a syncopal episode recently on the pool deck where she fell but doesn't remember how she fell. Remembers feeling dizzy prior to that. Spoke with her spouse's cardiologist, Dr Edd Arbour at [facility] and he is recommending a ZIO patch. Pt has not established with Dr Edd Arbour yet and he recommended that PCP send them an order for ZIO patch. Monitor 0-14 days then have pt schedule appt with him in 3 weeks. ZIO patch will monitor heart rate for 14 days and is very discreet, no bulky monitor to wear.  I spoke with [facility], [phone], she stated we could place order in [facility] but I was unable to find order in [facility] using her order code ([id number]) or by name. She states we cannot fax written order as the order must be [facility] under a Provider's name and she cannot place order in [facility] as it will be under her name. States we can send referral for pt to establish with Dr Edd Arbour and once she sees him then pt can be set up with the ZIO patch. Please advise?

## 2016-07-14 NOTE — Telephone Encounter (Signed)
She was in so much pain yesterday she went to [facility] and saw Dr Erlinda Hong.  He did xrays of knee as well. It all looked ok.  He thinks it may be originating in her back.  She has canceled her appt for Friday. She didn't get any relief from last injection.  She will call back and reschedule if she decides to try again.

## 2016-07-16 ENCOUNTER — Ambulatory Visit: Payer: Self-pay | Admitting: Physical Medicine & Rehabilitation

## 2016-07-16 ENCOUNTER — Ambulatory Visit (INDEPENDENT_AMBULATORY_CARE_PROVIDER_SITE_OTHER): Payer: 59 | Admitting: Family

## 2016-08-03 ENCOUNTER — Telehealth: Payer: Self-pay | Admitting: Medical

## 2016-08-03 ENCOUNTER — Other Ambulatory Visit: Payer: Self-pay | Admitting: Family

## 2016-08-03 MED ORDER — CLONAZEPAM 0.5 MG PO TABS: ORAL_TABLET | ORAL | 0 refills | Status: DC

## 2016-08-03 NOTE — Telephone Encounter (Signed)
Caller name: Relationship to patient: Self Can be reached: [phone]  Pharmacy:  [place], [address]. [address] [phone] (Phone) [phone] (Fax)     Reason for call: Request refill on clonazePAM (KLONOPIN) 0.5 MG tablet

## 2016-08-03 NOTE — Telephone Encounter (Signed)
Rx faxed to pharmacy at 12:05pm.

## 2016-08-03 NOTE — Telephone Encounter (Signed)
Refill Clonazepam   Last RX:07/01/16 Last OV:05/05/16 Next LD:KCCQ  UDS:05/13/16 CSC:12/23/14

## 2016-08-26 DIAGNOSIS — Z6820 Body mass index (BMI) 20.0-20.9, adult: Secondary | ICD-10-CM | POA: Diagnosis not present

## 2016-08-26 DIAGNOSIS — Z01419 Encounter for gynecological examination (general) (routine) without abnormal findings: Secondary | ICD-10-CM | POA: Diagnosis not present

## 2016-08-30 ENCOUNTER — Encounter: Payer: Self-pay | Admitting: Physical Medicine & Rehabilitation

## 2016-08-30 ENCOUNTER — Encounter: Payer: 59 | Attending: Physical Medicine & Rehabilitation

## 2016-08-30 ENCOUNTER — Ambulatory Visit (HOSPITAL_BASED_OUTPATIENT_CLINIC_OR_DEPARTMENT_OTHER): Payer: 59 | Admitting: Physical Medicine & Rehabilitation

## 2016-08-30 VITALS — BP 109/76 | HR 64 | Resp 14

## 2016-08-30 DIAGNOSIS — L570 Actinic keratosis: Secondary | ICD-10-CM | POA: Diagnosis not present

## 2016-08-30 DIAGNOSIS — L820 Inflamed seborrheic keratosis: Secondary | ICD-10-CM | POA: Diagnosis not present

## 2016-08-30 DIAGNOSIS — M533 Sacrococcygeal disorders, not elsewhere classified: Secondary | ICD-10-CM | POA: Diagnosis not present

## 2016-08-30 DIAGNOSIS — M47816 Spondylosis without myelopathy or radiculopathy, lumbar region: Secondary | ICD-10-CM | POA: Diagnosis not present

## 2016-08-30 MED ORDER — TRAMADOL HCL 50 MG PO TABS: 100.0000 mg | ORAL_TABLET | Freq: Three times a day (TID) | ORAL | 5 refills | Status: DC

## 2016-08-30 NOTE — Progress Notes (Signed)

## 2016-08-30 NOTE — Progress Notes (Signed)
  PROCEDURE RECORD [facility]   Name: Jamell Opfer DOB:1961/04/01 MRN: [id number]  Date:08/30/2016  Physician: Alysia Penna, MD    Nurse/CMA: Cheyna Retana, CMA  Allergies:  Allergies  Allergen Reactions  . Buspar [Buspirone]     constipation  . Eszopiclone     Headaches  . Flexeril [Cyclobenzaprine Hcl] Other (See Comments)    Mood swings  . Gabapentin     sedation  . Morphine And Related Other (See Comments)    No relief  . Nsaids Other (See Comments)    GI bleed  . Paroxetine Nausea Only       . Shellfish Allergy Other (See Comments)    skin & mouth tingle  . Tizanidine Hcl Other (See Comments)    Dizziness, mood swings    Consent Signed: Yes.    Is patient diabetic? No.  CBG today?   Pregnant: No. LMP: No LMP recorded. (age 23-55)  Anticoagulants: no Anti-inflammatory: no Antibiotics: no  Procedure: right sacroiliac steroid injection  Position: Prone Start Time:  3:28pm     End Time: 3:31pm  Fluoro Time: 12  RN/CMA Latron Ribas, CMA Tiffanny Lamarche, CMA    Time 3:10pm 3:35pm    BP 109/76 130/89    Pulse 64 64    Respirations 14 14    O2 Sat 99 99    S/S 6 6    Pain Level 8/10 2/10     D/C home with husband, patient A & O X 3, D/C instructions reviewed, and sits independently.

## 2016-09-03 ENCOUNTER — Telehealth: Payer: Self-pay | Admitting: Family

## 2016-09-03 NOTE — Telephone Encounter (Signed)
Self.  Refill on [place]: [facility] [place], [address]

## 2016-09-06 ENCOUNTER — Other Ambulatory Visit: Payer: Self-pay | Admitting: Medical

## 2016-09-06 MED ORDER — CLONAZEPAM 0.5 MG PO TABS: ORAL_TABLET | ORAL | 0 refills | Status: DC

## 2016-09-06 NOTE — Telephone Encounter (Signed)
Lets print rx and have her leave UDS when she comes to pick up rx please.

## 2016-09-06 NOTE — Telephone Encounter (Signed)
Patient checking on the status of Rx mentioned below, patient would like to speak with CMA, please advise

## 2016-09-06 NOTE — Telephone Encounter (Signed)
Last clonazepam RX: 08/03/16, #70 Last OV: 05/05/16 Next OV:  None scheduled UDS:  05/13/16, moderate. Due 08/11/16.  Please advise request and follow up?

## 2016-09-06 NOTE — Telephone Encounter (Signed)
Rx printed and forwarded to PCP for signature. 

## 2016-09-06 NOTE — Telephone Encounter (Signed)
Rx placed at front desk and pt has been notified. Will need to provide UDS at time of pick up.

## 2016-09-09 ENCOUNTER — Encounter: Payer: Self-pay | Admitting: Family

## 2016-10-01 ENCOUNTER — Ambulatory Visit: Payer: Self-pay | Admitting: Physical Medicine & Rehabilitation

## 2016-10-04 ENCOUNTER — Encounter: Payer: Self-pay | Admitting: Physical Medicine & Rehabilitation

## 2016-10-04 ENCOUNTER — Ambulatory Visit (HOSPITAL_BASED_OUTPATIENT_CLINIC_OR_DEPARTMENT_OTHER): Payer: 59 | Admitting: Physical Medicine & Rehabilitation

## 2016-10-04 ENCOUNTER — Encounter: Payer: 59 | Attending: Physical Medicine & Rehabilitation

## 2016-10-04 VITALS — BP 112/69 | HR 73

## 2016-10-04 DIAGNOSIS — M47816 Spondylosis without myelopathy or radiculopathy, lumbar region: Secondary | ICD-10-CM

## 2016-10-04 NOTE — Patient Instructions (Signed)

## 2016-10-04 NOTE — Progress Notes (Signed)
  PROCEDURE RECORD [name]   Name: Shannon [name] DOB:12-Aug-1960 MRN: [id number]  Date:10/04/2016  Physician: Alysia Penna, MD    Nurse/CMA: Bright CMA  Allergies:  Allergies  Allergen Reactions  . Buspar [Buspirone]     constipation  . Eszopiclone     Headaches  . Flexeril [Cyclobenzaprine Hcl] Other (See Comments)    Mood swings  . Gabapentin     sedation  . Morphine And Related Other (See Comments)    No relief  . Nsaids Other (See Comments)    GI bleed  . Paroxetine Nausea Only       . Shellfish Allergy Other (See Comments)    skin & mouth tingle  . Tizanidine Hcl Other (See Comments)    Dizziness, mood swings    Consent Signed: Yes.    Is patient diabetic? No.  CBG today?   Pregnant: No. LMP: No LMP recorded. (age 16-55)  Anticoagulants: no Anti-inflammatory: no Antibiotics: no  Procedure: MBB L3-5 Position: Prone   Start Time: 324PM End Time: 336PM  Fluoro Time: 55S  RN/CMA Bright CMA Bright CMA    Time 255pm 340PM    BP 112/69 133/75    Pulse 73 78    Respirations 16 16    O2 Sat 99 99    S/S 6 6    Pain Level 8/10 2/10     D/C home with Husband Shannon [name], patient A & O X 3, D/C instructions reviewed, and sits independently.

## 2016-10-04 NOTE — Progress Notes (Signed)
Bilateral Lumbar L3, L4  medial branch blocks and L 5 dorsal ramus injection under fluoroscopic guidance  Indication: Lumbar pain which is not relieved by medication management or other conservative care and interfering with self-care and mobility.  Informed consent was obtained after describing risks and benefits of the procedure with the patient, this includes bleeding, infection, paralysis and medication side effects.  The patient wishes to proceed and has given written consent.  The patient was placed in prone position.  The lumbar area was marked and prepped with Betadine.  One mL of 1% lidocaine was injected into each of 6 areas into the skin and subcutaneous tissue.  Then a 22-gauge 3.5in spinal needle was inserted targeting the junction of the left S1 superior articular process and sacral ala junction. Needle was advanced under fluoroscopic guidance.  Bone contact was made.  Isovue 200 was injected x 0.5 mL demonstrating no intravascular uptake.  Then a solution  of 2% MPF lidocaine was injected x 0.5 mL.  Then the left L5 superior articular process in transverse process junction was targeted.  Bone contact was made.  Isovue 200 was injected x 0.5 mL demonstrating no intravascular uptake. Then a solution containing  2% MPF lidocaine was injected x 0.5 mL.  Then the left L4 superior articular process in transverse process junction was targeted.  Bone contact was made.  Isovue 200 was injected x 0.5 mL demonstrating no intravascular uptake.  Then a solution containing2% MPF lidocaine was injected x 0.5 mL.  This same procedure was performed on the right side using the same needle, technique and injectate.  Patient tolerated procedure well.  Post procedure instructions were given.  

## 2016-10-07 ENCOUNTER — Telehealth: Payer: Self-pay | Admitting: Family

## 2016-10-07 NOTE — Telephone Encounter (Signed)
°

## 2016-10-08 MED ORDER — CLONAZEPAM 0.5 MG PO TABS: ORAL_TABLET | ORAL | 0 refills | Status: DC

## 2016-10-08 NOTE — Telephone Encounter (Signed)
Rx faxed to pharmacy and notified pt.

## 2016-10-08 NOTE — Telephone Encounter (Signed)
Last RX:  09/06/16, #30 Last OV:  06/01/16 Next OV: was due 08/03/16 and is past due UDS:  09/06/16 moderate.  Rx printed and forwarded to PCP for signature.

## 2016-11-01 ENCOUNTER — Ambulatory Visit (INDEPENDENT_AMBULATORY_CARE_PROVIDER_SITE_OTHER): Payer: 59 | Admitting: Family

## 2016-11-01 VITALS — Ht 66.0 in

## 2016-11-01 DIAGNOSIS — F419 Anxiety disorder, unspecified: Secondary | ICD-10-CM

## 2016-11-01 DIAGNOSIS — J019 Acute sinusitis, unspecified: Secondary | ICD-10-CM

## 2016-11-01 MED ORDER — AMOXICILLIN-POT CLAVULANATE 875-125 MG PO TABS: 1.0000 | ORAL_TABLET | Freq: Two times a day (BID) | ORAL | 0 refills | Status: DC

## 2016-11-01 MED ORDER — CLONAZEPAM 0.5 MG PO TABS: ORAL_TABLET | ORAL | 0 refills | Status: DC

## 2016-11-01 NOTE — Progress Notes (Signed)
Subjective:    Patient ID: Jazline Cumbee, female    DOB: 08-Feb-1961, 56 y.o.   MRN: [id number]  HPI  Ms. Bawa is a 56 yr old female who presents today with chief complaint of right sided facial pressure.  This pressure is associated with pressure in her teeth. She has been taking sudafed and benadryl without significant improvement in her symptoms. Symptoms began 10 days ago.  Mild drainage.  Had a sore throat and laryngitis about 2 weeks ago which resolved.    Anxiety- She is maintained on klonopin prn.  Her daughter will be going on a mission trip to [place] and this makes her very nervous.    Review of Systems See HPI  Past Medical History:  Diagnosis Date  . Anemia   . Anxiety   . Depression   . Eating disorder    anorexia nervosa in her 20's  . Fainting spell   . Hx of laminectomy 1994   L4-5  . Kidney stones    passed on their own  . Migraine   . Stomach disorder    due to complications with cholecystectomy "almost died"     Social History   Social History  . Marital status: Married    Spouse name: N/A  . Number of children: 3  . Years of education: N/A   Occupational History  .  Self Employed   Social History Main Topics  . Smoking status: Never Smoker  . Smokeless tobacco: Never Used  . Alcohol use No  . Drug use: Yes  . Sexual activity: Yes    Partners: Male    Birth control/ protection: Surgical   Other Topics Concern  . Not on file   Social History Narrative   Regular exercise:  Yes (swim instructor)   Caffeine Use:  1 daily   Married 3 children ages 12 son, 73 son, and 26 daughter   Associates degree                Past Surgical History:  Procedure Laterality Date  . APPENDECTOMY  1976  . CHOLECYSTECTOMY  08/2009   reports history of gallbladder polyps, she reports stents in duct of lushca   . HYSTEROSCOPY  07/2011  . LAMINECTOMY  1994   L4-5  . TONSILLECTOMY  1982  . TUBAL LIGATION      Family History  Problem Relation  Age of Onset  . Hypertension Mother   . Diabetes Father   . Hypertension Father   . Hypothyroidism Father   . CAD Father   . Alcohol abuse Maternal Grandmother   . Cancer Paternal Grandfather        lung cancer  . Hypothyroidism Other   . Hashimoto's thyroiditis Sister   . Hypothyroidism Sister   . Neuropathy Sister        chronic inflammatory demyelinating peripheral neuropathy from lyme disease    Allergies  Allergen Reactions  . Buspar [Buspirone]     constipation  . Eszopiclone     Headaches  . Flexeril [Cyclobenzaprine Hcl] Other (See Comments)    Mood swings  . Gabapentin     sedation  . Morphine And Related Other (See Comments)    No relief  . Nsaids Other (See Comments)    GI bleed  . Paroxetine Nausea Only       . Shellfish Allergy Other (See Comments)    skin & mouth tingle  . Tizanidine Hcl Other (See Comments)    Dizziness, mood  swings    Current Outpatient Prescriptions on File Prior to Visit  Medication Sig Dispense Refill  . clonazePAM (KLONOPIN) 0.5 MG tablet TAKE ONE TABLET THREE TIMES A DAY AS NEEDED FOR ANXIETY 70 tablet 0  . traMADol (ULTRAM) 50 MG tablet Take 2 tablets (100 mg total) by mouth 3 (three) times daily. 180 tablet 5   No current facility-administered medications on file prior to visit.     Ht 5\' 6"  (1.676 m)       Objective:   Physical Exam  Constitutional: She is oriented to person, place, and time. She appears well-developed and well-nourished.  HENT:  Head: Normocephalic and atraumatic.  Right Ear: Tympanic membrane and ear canal normal.  Left Ear: Tympanic membrane and ear canal normal.  Nose: Right sinus exhibits maxillary sinus tenderness and frontal sinus tenderness. Left sinus exhibits no maxillary sinus tenderness and no frontal sinus tenderness.  Mouth/Throat: No oropharyngeal exudate, posterior oropharyngeal edema or posterior oropharyngeal erythema.  Cardiovascular: Normal rate, regular rhythm and normal heart  sounds.   No murmur heard. Pulmonary/Chest: Effort normal and breath sounds normal. No respiratory distress. She has no wheezes.  Musculoskeletal: She exhibits no edema.  Neurological: She is alert and oriented to person, place, and time.  Skin: Skin is warm.  Psychiatric: She has a normal mood and affect. Her behavior is normal. Judgment and thought content normal.          Assessment & Plan:  Sinusitis- new. rx with augmentin. She is advised to call if new/worsening symptoms or if symptoms are not improved in 3 days.   Anxiety- having some situational anxiety. Refill provided on her klonopin- not to be filled prior to 11/05/16. She is up to date on her UDS.

## 2016-11-01 NOTE — Patient Instructions (Signed)
Please begin augmentin.  Let us know if new/worsening symptoms or if you are not improved in 3 days.    Sinusitis, Adult Sinusitis is soreness and inflammation of your sinuses. Sinuses are hollow spaces in the bones around your face. Your sinuses are located:  Around your eyes.  In the middle of your forehead.  Behind your nose.  In your cheekbones.  Your sinuses and nasal passages are lined with a stringy fluid (mucus). Mucus normally drains out of your sinuses. When your nasal tissues become inflamed or swollen, the mucus can become trapped or blocked so air cannot flow through your sinuses. This allows bacteria, viruses, and funguses to grow, which leads to infection. Sinusitis can develop quickly and last for 7?10 days (acute) or for more than 12 weeks (chronic). Sinusitis often develops after a cold. What are the causes? This condition is caused by anything that creates swelling in the sinuses or stops mucus from draining, including:  Allergies.  Asthma.  Bacterial or viral infection.  Abnormally shaped bones between the nasal passages.  Nasal growths that contain mucus (nasal polyps).  Narrow sinus openings.  Pollutants, such as chemicals or irritants in the air.  A foreign object stuck in the nose.  A fungal infection. This is rare.  What increases the risk? The following factors may make you more likely to develop this condition:  Having allergies or asthma.  Having had a recent cold or respiratory tract infection.  Having structural deformities or blockages in your nose or sinuses.  Having a weak immune system.  Doing a lot of swimming or diving.  Overusing nasal sprays.  Smoking.  What are the signs or symptoms? The main symptoms of this condition are pain and a feeling of pressure around the affected sinuses. Other symptoms include:  Upper toothache.  Earache.  Headache.  Bad breath.  Decreased sense of smell and taste.  A cough that may  get worse at night.  Fatigue.  Fever.  Thick drainage from your nose. The drainage is often green and it may contain pus (purulent).  Stuffy nose or congestion.  Postnasal drip. This is when extra mucus collects in the throat or back of the nose.  Swelling and warmth over the affected sinuses.  Sore throat.  Sensitivity to light.  How is this diagnosed? This condition is diagnosed based on symptoms, a medical history, and a physical exam. To find out if your condition is acute or chronic, your health care provider may:  Look in your nose for signs of nasal polyps.  Tap over the affected sinus to check for signs of infection.  View the inside of your sinuses using an imaging device that has a light attached (endoscope).  If your health care provider suspects that you have chronic sinusitis, you may also:  Be tested for allergies.  Have a sample of mucus taken from your nose (nasal culture) and checked for bacteria.  Have a mucus sample examined to see if your sinusitis is related to an allergy.  If your sinusitis does not respond to treatment and it lasts longer than 8 weeks, you may have an MRI or CT scan to check your sinuses. These scans also help to determine how severe your infection is. In rare cases, a bone biopsy may be done to rule out more serious types of fungal sinus disease. How is this treated? Treatment for sinusitis depends on the cause and whether your condition is chronic or acute. If a virus is causing  your sinusitis, your symptoms will go away on their own within 10 days. You may be given medicines to relieve your symptoms, including:  Topical nasal decongestants. They shrink swollen nasal passages and let mucus drain from your sinuses.  Antihistamines. These drugs block inflammation that is triggered by allergies. This can help to ease swelling in your nose and sinuses.  Topical nasal corticosteroids. These are nasal sprays that ease inflammation and  swelling in your nose and sinuses.  Nasal saline washes. These rinses can help to get rid of thick mucus in your nose.  If your condition is caused by bacteria, you will be given an antibiotic medicine. If your condition is caused by a fungus, you will be given an antifungal medicine. Surgery may be needed to correct underlying conditions, such as narrow nasal passages. Surgery may also be needed to remove polyps. Follow these instructions at home: Medicines  Take, use, or apply over-the-counter and prescription medicines only as told by your health care provider. These may include nasal sprays.  If you were prescribed an antibiotic medicine, take it as told by your health care provider. Do not stop taking the antibiotic even if you start to feel better. Hydrate and Humidify  Drink enough water to keep your urine clear or pale yellow. Staying hydrated will help to thin your mucus.  Use a cool mist humidifier to keep the humidity level in your home above 50%.  Inhale steam for 10-15 minutes, 3-4 times a day or as told by your health care provider. You can do this in the bathroom while a hot shower is running.  Limit your exposure to cool or dry air. Rest  Rest as much as possible.  Sleep with your head raised (elevated).  Make sure to get enough sleep each night. General instructions  Apply a warm, moist washcloth to your face 3-4 times a day or as told by your health care provider. This will help with discomfort.  Wash your hands often with soap and water to reduce your exposure to viruses and other germs. If soap and water are not available, use hand sanitizer.  Do not smoke. Avoid being around people who are smoking (secondhand smoke).  Keep all follow-up visits as told by your health care provider. This is important. Contact a health care provider if:  You have a fever.  Your symptoms get worse.  Your symptoms do not improve within 10 days. Get help right away  if:  You have a severe headache.  You have persistent vomiting.  You have pain or swelling around your face or eyes.  You have vision problems.  You develop confusion.  Your neck is stiff.  You have trouble breathing. This information is not intended to replace advice given to you by your health care provider. Make sure you discuss any questions you have with your health care provider. Document Released: 04/19/2005 Document Revised: 12/14/2015 Document Reviewed: 02/12/2015 Elsevier Interactive Patient Education  2017 [employer].

## 2016-11-15 ENCOUNTER — Ambulatory Visit: Payer: Self-pay | Admitting: Physical Medicine & Rehabilitation

## 2016-12-05 ENCOUNTER — Other Ambulatory Visit: Payer: Self-pay | Admitting: Family Medicine

## 2016-12-06 NOTE — Telephone Encounter (Signed)
Left detailed message on pt's voicemail that rx is ready for pick up and UDS will be due at that time. Rx placed at front desk.

## 2016-12-06 NOTE — Telephone Encounter (Signed)
Last clonazepam RX: 11/05/16, #70 Last OV: 11/01/16 Next OV:  None scheduled UDS: 09/06/16 and due 12/07/16. Pt will need to pick up Rx and provide UDS at that time.  Rx printed and forwarded to PCP for signature.

## 2016-12-07 ENCOUNTER — Encounter: Payer: Self-pay | Admitting: Family

## 2016-12-07 DIAGNOSIS — Z79899 Other long term (current) drug therapy: Secondary | ICD-10-CM | POA: Diagnosis not present

## 2016-12-20 ENCOUNTER — Ambulatory Visit: Payer: Self-pay | Admitting: Physical Medicine & Rehabilitation

## 2017-01-07 ENCOUNTER — Telehealth: Payer: Self-pay | Admitting: Family

## 2017-01-07 MED ORDER — CLONAZEPAM 0.5 MG PO TABS: ORAL_TABLET | ORAL | 0 refills | Status: DC

## 2017-01-07 NOTE — Telephone Encounter (Signed)
°  Relation to DB:ZMCE Call back number: [phone] Pharmacy: [facility], [place] [address] [phone] (Phone) [phone] (Fax)     Reason for call:  Patient requesting a refill clonazePAM (KLONOPIN) 0.5 MG tablet

## 2017-01-07 NOTE — Telephone Encounter (Signed)
Last RX: 12/06/16, #70 Last OV: 11/05/16 Next OV: none scheduled UDS: 12/07/16 moderate and due 03/09/17.  Rx printed and forwarded to PCP for signature.  Please advise when pt is due for follow up in the future?

## 2017-01-07 NOTE — Telephone Encounter (Signed)
Will need 6 month follow-up please.

## 2017-01-07 NOTE — Telephone Encounter (Signed)
Rx was faxed to pharmacy at 1:55pm today. Please call pt to schedule appt as recommended below. Thanks!

## 2017-01-10 NOTE — Telephone Encounter (Signed)
Pt has been scheduled.  °

## 2017-01-24 ENCOUNTER — Ambulatory Visit (INDEPENDENT_AMBULATORY_CARE_PROVIDER_SITE_OTHER): Payer: 59 | Admitting: Family

## 2017-01-24 ENCOUNTER — Encounter: Payer: Self-pay | Admitting: Family

## 2017-01-24 DIAGNOSIS — F419 Anxiety disorder, unspecified: Secondary | ICD-10-CM | POA: Diagnosis not present

## 2017-01-24 DIAGNOSIS — F329 Major depressive disorder, single episode, unspecified: Secondary | ICD-10-CM | POA: Diagnosis not present

## 2017-01-24 DIAGNOSIS — F32A Depression, unspecified: Secondary | ICD-10-CM

## 2017-01-24 MED ORDER — CLONAZEPAM 0.5 MG PO TABS: ORAL_TABLET | ORAL | 0 refills | Status: DC

## 2017-01-24 NOTE — Assessment & Plan Note (Signed)
Uncontrolled. We discussed possibility of adding some medication. She declines at this time. She states she may be open to starting amitriptyline in a few months if things are not better. We discussed getting back in with her therapist and she is agreeable to do so. She will call to schedule follow-up appointment with her therapist. Will obtain a urine drug screen today. Refill provided for Klonopin.   A total of 15 minutes were spent face-to-face with the patient during this encounter and over half of that time was spent on counseling and coordination of care. The patient was counseled on anxiety/depression.

## 2017-01-24 NOTE — Patient Instructions (Signed)
Please complete lab work prior to leaving. Schedule follow up with your therapist.

## 2017-01-24 NOTE — Progress Notes (Signed)
Subjective:    Patient ID: Shannon [name], female    DOB: January 05, 1961, 56 y.o.   MRN: [id number]  HPI  Shannon [name] is a 56 yr old female who presents today for follow up of her anxiety/depression.     She reports that she uses 2-3 klonopin a day.  Still getting her period "like clockwork."  She is more emotional before her period. She was working with Estrella Deeds at [place] [facility].  She stopped seeing him because she was feeling better. She notes recently that she's had increased worry about her son. Her son is living in [place] with his girlfriend. He has not spoken to her since May. She misses her granddaughter who is 63 months old who lives down there. Finds herself worrying about the worst possible outcome in various situations. For example her puppy was sick and she immediately thought she was going to die. She wishes not to start any medication at this time.  Wt Readings from Last 3 Encounters:  01/24/17 124 lb 6.4 oz (56.4 kg)  05/05/16 125 lb 9.6 oz (57 kg)  11/03/15 125 lb (56.7 kg)    Review of Systems    see HPI  Past Medical History:  Diagnosis Date  . Anemia   . Anxiety   . Depression   . Eating disorder    anorexia nervosa in her 20's  . Fainting spell   . Hx of laminectomy 1994   L4-5  . Kidney stones    passed on their own  . Migraine   . Stomach disorder    due to complications with cholecystectomy "almost died"     Social History   Social History  . Marital status: Married    Spouse name: N/A  . Number of children: 3  . Years of education: N/A   Occupational History  .  Self Employed   Social History Main Topics  . Smoking status: Never Smoker  . Smokeless tobacco: Never Used  . Alcohol use No  . Drug use: Yes  . Sexual activity: Yes    Partners: Male    Birth control/ protection: Surgical   Other Topics Concern  . Not on file   Social History Narrative   Regular exercise:  Yes (swim instructor)   Caffeine Use:  1 daily   Married  3 children ages 59 son, 65 son, and 50 daughter   Associates degree                Past Surgical History:  Procedure Laterality Date  . APPENDECTOMY  1976  . CHOLECYSTECTOMY  08/2009   reports history of gallbladder polyps, she reports stents in duct of lushca   . HYSTEROSCOPY  07/2011  . LAMINECTOMY  1994   L4-5  . TONSILLECTOMY  1982  . TUBAL LIGATION      Family History  Problem Relation Age of Onset  . Hypertension Mother   . Diabetes Father   . Hypertension Father   . Hypothyroidism Father   . CAD Father   . Alcohol abuse Maternal Grandmother   . Cancer Paternal Grandfather        lung cancer  . Hypothyroidism Other   . Hashimoto's thyroiditis Sister   . Hypothyroidism Sister   . Neuropathy Sister        chronic inflammatory demyelinating peripheral neuropathy from lyme disease    Allergies  Allergen Reactions  . Buspar [Buspirone]     constipation  . Eszopiclone     Headaches  .  Flexeril [Cyclobenzaprine Hcl] Other (See Comments)    Mood swings  . Gabapentin     sedation  . Morphine And Related Other (See Comments)    No relief  . Nsaids Other (See Comments)    GI bleed  . Paroxetine Nausea Only       . Shellfish Allergy Other (See Comments)    skin & mouth tingle  . Tizanidine Hcl Other (See Comments)    Dizziness, mood swings    Current Outpatient Prescriptions on File Prior to Visit  Medication Sig Dispense Refill  . clonazePAM (KLONOPIN) 0.5 MG tablet TAKE ONE TABLET THREE TIMES A DAY AS NEEDED FOR ANXIETY 70 tablet 0  . traMADol (ULTRAM) 50 MG tablet Take 2 tablets (100 mg total) by mouth 3 (three) times daily. 180 tablet 5   No current facility-administered medications on file prior to visit.     BP 119/76 (BP Location: Right Arm, Cuff Size: Normal)   Pulse 76   Temp 98.3 F (36.8 C) (Oral)   Resp 16   Ht 5\' 6"  (1.676 m)   Wt 124 lb 6.4 oz (56.4 kg)   LMP 01/07/2017   SpO2 100%   BMI 20.08 kg/m    Objective:   Physical Exam    Constitutional: She appears well-developed and well-nourished. No distress.  Psychiatric: Judgment and thought content normal.  Tearful but pleasant          Assessment & Plan:

## 2017-01-31 DIAGNOSIS — M7501 Adhesive capsulitis of right shoulder: Secondary | ICD-10-CM | POA: Diagnosis not present

## 2017-02-10 ENCOUNTER — Encounter: Payer: 59 | Attending: Physical Medicine & Rehabilitation

## 2017-02-10 ENCOUNTER — Ambulatory Visit (HOSPITAL_BASED_OUTPATIENT_CLINIC_OR_DEPARTMENT_OTHER): Payer: 59 | Admitting: Physical Medicine & Rehabilitation

## 2017-02-10 ENCOUNTER — Encounter: Payer: Self-pay | Admitting: Physical Medicine & Rehabilitation

## 2017-02-10 VITALS — BP 111/71 | HR 64 | Resp 16

## 2017-02-10 DIAGNOSIS — M533 Sacrococcygeal disorders, not elsewhere classified: Secondary | ICD-10-CM | POA: Diagnosis not present

## 2017-02-10 DIAGNOSIS — F419 Anxiety disorder, unspecified: Secondary | ICD-10-CM | POA: Insufficient documentation

## 2017-02-10 DIAGNOSIS — F329 Major depressive disorder, single episode, unspecified: Secondary | ICD-10-CM | POA: Diagnosis not present

## 2017-02-10 DIAGNOSIS — M47816 Spondylosis without myelopathy or radiculopathy, lumbar region: Secondary | ICD-10-CM

## 2017-02-10 NOTE — Progress Notes (Signed)
Subjective:    Patient ID: Shannon [name], female    DOB: October 11, 1960, 56 y.o.   MRN: [id number]  HPI  Chief complaint is low back pain, bilateral. This pain radiates into the groin area as well as the hip area and then down bilateral anterior thighs and occasionally into the calf. Right side is worse than left side. Pain is fairly constant and can be sharp and burning at times as well as stabbing. Twisting and turning seem to make this worse, as well as sitting and inactivity. She has had some Tylenol with Codeine which helps when she couldn't sleep. She continues to work Librarian, academic.  No numbness or tingling in the lower limbs. No weakness in the lower limbs. No bowel or bladder dysfunction   Pain Inventory Average Pain 8 Pain Right Now 8 My pain is constant, sharp, burning, stabbing and aching  In the last 24 hours, has pain interfered with the following? General activity 7 Relation with others 5 Enjoyment of life 5 What TIME of day is your pain at its worst? all Sleep (in general) Poor  Pain is worse with: sitting, inactivity and some activites Pain improves with: medication Relief from Meds: 6  Mobility walk without assistance ability to climb steps?  yes do you drive?  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  no CLINICAL DATA:  Post-laminectomy syndrome. Right lumbar radicular pain. Fall out of bed 05/21/2015. Pain in the right low back and right hip. Numbness and tingling down the right leg and inside the groin. Right hip injection 2 weeks ago without relief. Lumbar injection 6 months ago without relief. Right foot drop.   EXAM: MRI LUMBAR SPINE WITHOUT AND WITH CONTRAST   TECHNIQUE: Multiplanar and multiecho pulse sequences of the lumbar spine were obtained without and with intravenous contrast.   CONTRAST:  11 mL MultiHance   COMPARISON:   06/25/2011   FINDINGS: Vertebral alignment is unchanged and without significant listhesis. Vertebral body heights are preserved. There is diffuse lumbar disc desiccation. At L4-5, there is moderate to severe disc space height loss with mildly progressive, predominantly type 2 degenerative endplate changes. Mild disc space narrowing at L1-2 is slightly progressed, with associated mild type 1 and type 2 degenerative endplate changes. There is diffuse lumbar disc desiccation with relative exception of L3-4. Conus medullaris is normal in signal and terminates at L1. Paraspinal soft tissues are unremarkable.   L1-2: Mild disc bulging without spinal stenosis, not significantly changed.   L2-3: Stable to minimally increased disc bulging with a small central annular fissure again seen. No significant stenosis.   L3-4: Broad left subarticular/foraminal/ extraforaminal disc protrusion and mild facet hypertrophy result in mild to moderate left lateral recess stenosis, at most minimally progressed from prior. No spinal canal or neural foraminal stenosis.   L4-5: Prior laminectomies are again noted. Disc bulging asymmetric to the left, disc space height loss, and facet hypertrophy result in mild-to-moderate bilateral lateral recess stenosis and mild left neural foraminal stenosis, unchanged. No residual spinal stenosis.   L5-S1: Mild disc bulging, shallow left foraminal disc protrusion with annular fissure, and mild facet hypertrophy result in minimal left neural foraminal narrowing and mild bilateral lateral recess narrowing without spinal stenosis, unchanged.   IMPRESSION: 1. Postsurgical changes at L4-5 with progressive degenerative endplate changes. Mild-to-moderate residual bilateral lateral recess stenosis and mild left neural foraminal stenosis are unchanged. 2. Left-sided  L3-4 disc protrusion resulting in mild-to-moderate left lateral recess stenosis, at most minimally progressed  from prior. 3. Unchanged, mild bilateral lateral recess narrowing at L5-S1.     Electronically Signed   By: Logan Bores M.D.   On: 07/13/2015 08:45  Physicians involved in your care Any changes since last visit?  no   Family History  Problem Relation Age of Onset  . Hypertension Mother   . Diabetes Father   . Hypertension Father   . Hypothyroidism Father   . CAD Father   . Alcohol abuse Maternal Grandmother   . Cancer Paternal Grandfather        lung cancer  . Hypothyroidism Other   . Hashimoto's thyroiditis Sister   . Hypothyroidism Sister   . Neuropathy Sister        chronic inflammatory demyelinating peripheral neuropathy from lyme disease   Social History   Social History  . Marital status: Married    Spouse name: N/A  . Number of children: 3  . Years of education: N/A   Occupational History  .  Self Employed   Social History Main Topics  . Smoking status: Never Smoker  . Smokeless tobacco: Never Used  . Alcohol use No  . Drug use: Yes  . Sexual activity: Yes    Partners: Male    Birth control/ protection: Surgical   Other Topics Concern  . None   Social History Narrative   Regular exercise:  Yes (Cabin crew)   Caffeine Use:  1 daily   Married 3 children ages 43 son, 49 son, and 24 daughter   Associates degree               Past Surgical History:  Procedure Laterality Date  . APPENDECTOMY  1976  . CHOLECYSTECTOMY  08/2009   reports history of gallbladder polyps, she reports stents in duct of lushca   . HYSTEROSCOPY  07/2011  . LAMINECTOMY  1994   L4-5  . TONSILLECTOMY  1982  . TUBAL LIGATION     Past Medical History:  Diagnosis Date  . Anemia   . Anxiety   . Depression   . Eating disorder    anorexia nervosa in her 20's  . Fainting spell   . Hx of laminectomy 1994   L4-5  . Kidney stones    passed on their own  . Migraine   . Stomach disorder    due to complications with cholecystectomy "almost died"   BP 111/71    Pulse 64   Resp 16   SpO2 98%   Opioid Risk Score:   Fall Risk Score:  `1  Depression screen PHQ 2/9  Depression screen [facility] 2/9 01/24/2017 07/15/2015 06/27/2015 06/19/2015  Decreased Interest 1 1 1 1   Down, Depressed, Hopeless 2 0 0 0  PHQ - 2 Score 3 1 1 1   Altered sleeping 3 - - -  Tired, decreased energy 2 - - -  Change in appetite 3 - - -  Feeling bad or failure about yourself  1 - - -  Trouble concentrating 3 - - -  Moving slowly or fidgety/restless 1 - - -  Suicidal thoughts 0 - - -  PHQ-9 Score 16 - - -    Review of Systems  Constitutional: Negative.   HENT: Negative.   Eyes: Negative.   Respiratory: Negative.   Cardiovascular: Negative.   Gastrointestinal: Negative.   Endocrine: Negative.   Genitourinary: Negative.   Musculoskeletal: Positive for back pain and  gait problem.       Spasms  Skin: Negative.   Allergic/Immunologic: Negative.   Hematological: Negative.   Psychiatric/Behavioral: The patient is nervous/anxious.   All other systems reviewed and are negative.      Objective:   Physical Exam  Constitutional: She is oriented to person, place, and time. She appears well-developed and well-nourished.  HENT:  Head: Normocephalic and atraumatic.  Eyes: Pupils are equal, round, and reactive to light.  Musculoskeletal:  There is tenderness along the lumbar paravertebral facet area, bilaterally, greatest at L5 levels.  There is pain with extension of the lumbar spine with limitation of range of motion, lumbar flexion is full, but the patient needs to get up slowly and bend knees when arising  Hips show no pain with range of motion, full internal and external rotation, positive Faber's on the right in the PSIS area  Neurological: She is alert and oriented to person, place, and time. No sensory deficit. Gait normal.  Reflex Scores:      Patellar reflexes are 2+ on the right side and 2+ on the left side.      Achilles reflexes are 2+ on the right side and 2+ on  the left side. Sensation normal. Bilateral lower limbs.  Negative straight leg raise bilaterally   Psychiatric: She has a normal mood and affect.  Nursing note and vitals reviewed.         Assessment & Plan:  1. Lumbar spondylosis with greater than 75% pain relief after lumbar medial branch blocks, bilateral L3, L4 and L5 dorsal ramus. Patient had very good relief for 2 and a half months in partial relief for a total of 3 and a half months, but over the last couple weeks has had more severe pain, which is not relieved by her medication management as well as doing her physical therapy exercises, icing, and heating pads as well as Biofreeze gel  Recommend repeat procedure, bilateral L3-4 medial branch and L5 dorsal ramus injections. This will be done under fluoroscopic guidance.  She may be a good candidate for radiofrequency ablation of L3, L4 medial branch and L5 dorsal ramus. This may give her a longer duration of response. She has had greater than 50% pain relief on 2 occasions with the medial branch blocks  No radicular component. No need for imaging studies  2. Also has examined evidence of right sacroiliac pain. Has had some relief with sacroiliac injection in the past as well  Continue tramadol 100 mg 3 times a day, may take 500 mg of Tylenol with each dose if needed

## 2017-02-10 NOTE — Patient Instructions (Signed)
Would recommend repeat L3, L4 medial branch, L5 dorsal ramus injections under fluoroscopic guidance.

## 2017-02-16 DIAGNOSIS — M7501 Adhesive capsulitis of right shoulder: Secondary | ICD-10-CM | POA: Diagnosis not present

## 2017-02-17 ENCOUNTER — Ambulatory Visit (HOSPITAL_BASED_OUTPATIENT_CLINIC_OR_DEPARTMENT_OTHER): Payer: 59 | Admitting: Physical Medicine & Rehabilitation

## 2017-02-17 ENCOUNTER — Encounter: Payer: Self-pay | Admitting: Physical Medicine & Rehabilitation

## 2017-02-17 VITALS — BP 115/79 | HR 66 | Resp 16

## 2017-02-17 DIAGNOSIS — M47816 Spondylosis without myelopathy or radiculopathy, lumbar region: Secondary | ICD-10-CM

## 2017-02-17 NOTE — Progress Notes (Signed)
  PROCEDURE RECORD [facility]   Name: Eleana Tocco DOB:06/14/1960 MRN: [id number]  Date:02/17/2017  Physician: Alysia Penna, MD    Nurse/CMA: Londa Mackowski, CMA  Allergies:  Allergies  Allergen Reactions  . Buspar [Buspirone]     constipation  . Eszopiclone     Headaches  . Flexeril [Cyclobenzaprine Hcl] Other (See Comments)    Mood swings  . Gabapentin     sedation  . Morphine And Related Other (See Comments)    No relief  . Nsaids Other (See Comments)    GI bleed  . Paroxetine Nausea Only       . Shellfish Allergy Other (See Comments)    skin & mouth tingle  . Tizanidine Hcl Other (See Comments)    Dizziness, mood swings    Consent Signed: Yes.    Is patient diabetic? No.  CBG today? n/a  Pregnant: No. LMP: No LMP recorded. (age 47-55)  Anticoagulants: no Anti-inflammatory: no Antibiotics: no  Procedure: bilateral L3,4,5 medial branch block Position: Prone Start Time: 10:46am  End Time: 10:58am  Fluoro Time: 50  RN/CMA Quanna Wittke, CMA Hermela Hardt, CMA    Time 10:30am 11:00am    BP 115/79 124/84    Pulse 66 70    Respirations 16 16    O2 Sat 97 98    S/S 6 6    Pain Level 8/10 6124/84/10     D/C home with husband, patient A & O X 3, D/C instructions reviewed, and sits independently.

## 2017-02-17 NOTE — Progress Notes (Deleted)
Bilateral Lumbar L3, L4  medial branch blocks and L 5 dorsal ramus injection under fluoroscopic guidance  Indication: Lumbar pain which is not relieved by medication management or other conservative care and interfering with self-care and mobility.  Informed consent was obtained after describing risks and benefits of the procedure with the patient, this includes bleeding, infection, paralysis and medication side effects.  The patient wishes to proceed and has given written consent.  The patient was placed in prone position.  The lumbar area was marked and prepped with Betadine.  One m   Subjective:    Patient ID: Shannon [name], female    DOB: [birth year], 56 y.o.   MRN: [id number]  HPI  Chief complaint is low back pain, bilateral. This pain radiates into the groin area as well as the hip area and then down bilateral anterior thighs and occasionally into the calf. Right side is worse than left side. Pain is fairly constant and can be sharp and burning at times as well as stabbing. Twisting and turning seem to make this worse, as well as sitting and inactivity. She has had some Tylenol with Codeine which helps when she couldn't sleep. She continues to work Librarian, academic.  No numbness or tingling in the lower limbs. No weakness in the lower limbs. No bowel or bladder dysfunction   Pain Inventory Average Pain 8 Pain Right Now 8 My pain is constant, sharp, burning, stabbing and aching  In the last 24 hours, has pain interfered with the following? General activity 7 Relation with others 5 Enjoyment of life 5 What TIME of day is your pain at its worst? all Sleep (in general) Poor  Pain is worse with: sitting, inactivity and some activites Pain improves with: medication Relief from Meds: 6  Mobility walk without assistance ability to climb steps?  yes do you drive?  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  no CLINICAL DATA:  Post-laminectomy syndrome. Right lumbar radicular pain. Fall out of bed 05/21/2015. Pain in the right low back and right hip. Numbness and tingling down the right leg and inside the groin. Right hip injection 2 weeks ago without relief. Lumbar injection 6 months ago without relief. Right foot drop.   EXAM: MRI LUMBAR SPINE WITHOUT AND WITH CONTRAST   TECHNIQUE: Multiplanar and multiecho pulse sequences of the lumbar spine were obtained without and with intravenous contrast.   CONTRAST:  11 mL MultiHance   COMPARISON:  06/25/2011   FINDINGS: Vertebral alignment is unchanged and without significant listhesis. Vertebral body heights are preserved. There is diffuse lumbar disc desiccation. At L4-5, there is moderate to severe disc space height loss with mildly progressive, predominantly type 2 degenerative endplate changes. Mild disc space narrowing at L1-2 is slightly progressed, with associated mild type 1 and type 2 degenerative endplate changes. There is diffuse lumbar disc desiccation with relative exception of L3-4. Conus medullaris is normal in signal and terminates at L1. Paraspinal soft tissues are unremarkable.   L1-2: Mild disc bulging without spinal stenosis, not significantly changed.   L2-3: Stable to minimally increased disc bulging with a small central annular fissure again seen. No significant stenosis.   L3-4: Broad left subarticular/foraminal/ extraforaminal disc protrusion and mild facet hypertrophy result in mild to moderate left lateral recess stenosis, at most minimally progressed from prior. No spinal canal or neural foraminal stenosis.   L4-5: Prior laminectomies are again  noted. Disc bulging asymmetric to the left, disc space height loss, and facet hypertrophy result in mild-to-moderate bilateral lateral recess stenosis and mild left neural foraminal stenosis, unchanged. No residual spinal  stenosis.   L5-S1: Mild disc bulging, shallow left foraminal disc protrusion with annular fissure, and mild facet hypertrophy result in minimal left neural foraminal narrowing and mild bilateral lateral recess narrowing without spinal stenosis, unchanged.   IMPRESSION: 1. Postsurgical changes at L4-5 with progressive degenerative endplate changes. Mild-to-moderate residual bilateral lateral recess stenosis and mild left neural foraminal stenosis are unchanged. 2. Left-sided L3-4 disc protrusion resulting in mild-to-moderate left lateral recess stenosis, at most minimally progressed from prior. 3. Unchanged, mild bilateral lateral recess narrowing at L5-S1.     Electronically Signed   By: Logan Bores M.D.   On: 07/13/2015 08:45  Physicians involved in your care Any changes since last visit?  no   Family History  Problem Relation Age of Onset  . Hypertension Mother   . Diabetes Father   . Hypertension Father   . Hypothyroidism Father   . CAD Father   . Alcohol abuse Maternal Grandmother   . Cancer Paternal Grandfather        lung cancer  . Hypothyroidism Other   . Hashimoto's thyroiditis Sister   . Hypothyroidism Sister   . Neuropathy Sister        chronic inflammatory demyelinating peripheral neuropathy from lyme disease   Social History   Social History  . Marital status: Married    Spouse name: N/A  . Number of children: 3  . Years of education: N/A   Occupational History  .  Self Employed   Social History Main Topics  . Smoking status: Never Smoker  . Smokeless tobacco: Never Used  . Alcohol use No  . Drug use: Yes  . Sexual activity: Yes    Partners: Male    Birth control/ protection: Surgical   Other Topics Concern  . None   Social History Narrative   Regular exercise:  Yes (Cabin crew)   Caffeine Use:  1 daily   Married 3 children ages 65 son, 58 son, and 40 daughter   Associates degree               Past Surgical History:    Procedure Laterality Date  . APPENDECTOMY  1976  . CHOLECYSTECTOMY  08/2009   reports history of gallbladder polyps, she reports stents in duct of lushca   . HYSTEROSCOPY  07/2011  . LAMINECTOMY  1994   L4-5  . TONSILLECTOMY  1982  . TUBAL LIGATION     Past Medical History:  Diagnosis Date  . Anemia   . Anxiety   . Depression   . Eating disorder    anorexia nervosa in her 20's  . Fainting spell   . Hx of laminectomy 1994   L4-5  . Kidney stones    passed on their own  . Migraine   . Stomach disorder    due to complications with cholecystectomy "almost died"   BP 115/79 (BP Location: Right Arm, Patient Position: Sitting, Cuff Size: Normal)   Pulse 66   Resp 16   SpO2 97%   Opioid Risk Score:   Fall Risk Score:  `1  Depression screen PHQ 2/9  Depression screen [facility] 2/9 01/24/2017 07/15/2015 06/27/2015 06/19/2015  Decreased Interest 1 1 1 1   Down, Depressed, Hopeless 2 0 0 0  PHQ - 2 Score 3 1 1 1   Altered sleeping  3 - - -  Tired, decreased energy 2 - - -  Change in appetite 3 - - -  Feeling bad or failure about yourself  1 - - -  Trouble concentrating 3 - - -  Moving slowly or fidgety/restless 1 - - -  Suicidal thoughts 0 - - -  PHQ-9 Score 16 - - -    Review of Systems  Constitutional: Negative.   HENT: Negative.   Eyes: Negative.   Respiratory: Negative.   Cardiovascular: Negative.   Gastrointestinal: Negative.   Endocrine: Negative.   Genitourinary: Negative.   Musculoskeletal: Positive for back pain and gait problem.       Spasms  Skin: Negative.   Allergic/Immunologic: Negative.   Hematological: Negative.   Psychiatric/Behavioral: The patient is nervous/anxious.   All other systems reviewed and are negative.      Objective:   Physical Exam  Constitutional: She is oriented to person, place, and time. She appears well-developed and well-nourished.  HENT:  Head: Normocephalic and atraumatic.  Eyes: Pupils are equal, round, and reactive to light.   Musculoskeletal:  There is tenderness along the lumbar paravertebral facet area, bilaterally, greatest at L5 levels.  There is pain with extension of the lumbar spine with limitation of range of motion, lumbar flexion is full, but the patient needs to get up slowly and bend knees when arising  Hips show no pain with range of motion, full internal and external rotation, positive Faber's on the right in the PSIS area  Neurological: She is alert and oriented to person, place, and time. No sensory deficit. Gait normal.  Reflex Scores:      Patellar reflexes are 2+ on the right side and 2+ on the left side.      Achilles reflexes are 2+ on the right side and 2+ on the left side. Sensation normal. Bilateral lower limbs.  Negative straight leg raise bilaterally   Psychiatric: She has a normal mood and affect.  Nursing note and vitals reviewed.         Assessment & Plan:  1. Lumbar spondylosis with greater than 75% pain relief after lumbar medial branch blocks, bilateral L3, L4 and L5 dorsal ramus. Patient had very good relief for 2 and a half months in partial relief for a total of 3 and a half months, but over the last couple weeks has had more severe pain, which is not relieved by her medication management as well as doing her physical therapy exercises, icing, and heating pads as well as Biofreeze gel  Recommend repeat procedure, bilateral L3-4 medial branch and L5 dorsal ramus injections. This will be done under fluoroscopic guidance.  She may be a good candidate for radiofrequency ablation of L3, L4 medial branch and L5 dorsal ramus. This may give her a longer duration of response. She has had greater than 50% pain relief on 2 occasions with the medial branch blocks  No radicular component. No need for imaging studies  2. Also has examined evidence of right sacroiliac pain. Has had some relief with sacroiliac injection in the past as well  Continue tramadol 100 mg 3 times a day, may  take 500 mg of Tylenol with each dose if neededL of 1% lidocaine was injected into each of 6 areas into the skin and subcutaneous tissue.  Then a 22-gauge 3.5 in spinal needle was inserted targeting the junction of the left S1 superior articular process and sacral ala junction. Needle was advanced under fluoroscopic guidance.  Bone  contact was made.  Isovue 200 was injected x 0.5 mL demonstrating no intravascular uptake.  Then a solution  of 2% MPF lidocaine was injected x 0.5 mL.  Then the left L5 superior articular process in transverse process junction was targeted.  Bone contact was made.  Isovue 200 was injected x 0.5 mL demonstrating no intravascular uptake. Then a solution containing  2% MPF lidocaine was injected x 0.5 mL.  Then the left L4 superior articular process in transverse process junction was targeted.  Bone contact was made.  Isovue 200 was injected x 0.5 mL demonstrating no intravascular uptake.  Then a solution containing2% MPF lidocaine was injected x 0.5 mL.  This same procedure was performed on the right side using the same needle, technique and injectate.  Patient tolerated procedure well.  Post procedure instructions were given.

## 2017-02-17 NOTE — Patient Instructions (Signed)

## 2017-02-17 NOTE — Progress Notes (Signed)
Bilateral Lumbar L3, L4  medial branch blocks and L 5 dorsal ramus injection under fluoroscopic guidance  Indication: Lumbar pain which is not relieved by medication management or other conservative care and interfering with self-care and mobility.  Informed consent was obtained after describing risks and benefits of the procedure with the patient, this includes bleeding, infection, paralysis and medication side effects.  The patient wishes to proceed and has given written consent.  The patient was placed in prone position.  The lumbar area was marked and prepped with Betadine.  One mL of 1% lidocaine was injected into each of 6 areas into the skin and subcutaneous tissue.  Then a 22-gauge 3.5in spinal needle was inserted targeting the junction of the left S1 superior articular process and sacral ala junction. Needle was advanced under fluoroscopic guidance.  Bone contact was made.  Isovue 200 was injected x 0.5 mL demonstrating no intravascular uptake.  Then a solution  of 2% MPF lidocaine was injected x 0.5 mL.  Then the left L5 superior articular process in transverse process junction was targeted.  Bone contact was made.  Isovue 200 was injected x 0.5 mL demonstrating no intravascular uptake. Then a solution containing  2% MPF lidocaine was injected x 0.5 mL.  Then the left L4 superior articular process in transverse process junction was targeted.  Bone contact was made.  Isovue 200 was injected x 0.5 mL demonstrating no intravascular uptake.  Then a solution containing2% MPF lidocaine was injected x 0.5 mL.  This same procedure was performed on the right side using the same needle, technique and injectate.  Patient tolerated procedure well.  Post procedure instructions were given.  

## 2017-03-08 ENCOUNTER — Telehealth: Payer: Self-pay | Admitting: Physical Medicine & Rehabilitation

## 2017-03-08 ENCOUNTER — Telehealth: Payer: Self-pay | Admitting: Family

## 2017-03-08 DIAGNOSIS — F419 Anxiety disorder, unspecified: Principal | ICD-10-CM

## 2017-03-08 DIAGNOSIS — F329 Major depressive disorder, single episode, unspecified: Secondary | ICD-10-CM

## 2017-03-08 DIAGNOSIS — F32A Depression, unspecified: Secondary | ICD-10-CM

## 2017-03-08 MED ORDER — CLONAZEPAM 0.5 MG PO TABS: ORAL_TABLET | ORAL | 0 refills | Status: DC

## 2017-03-08 NOTE — Telephone Encounter (Signed)
Right sacroiliac injection is already planned

## 2017-03-08 NOTE — Telephone Encounter (Signed)
Patient called and stts that she needs a refill on her Klonopin, Please call patient if any questions @ [phone]. [facility] Oppen[facility] is OK to sent RX to.

## 2017-03-08 NOTE — Telephone Encounter (Signed)
Pt phoned and stated her right lower back and right lower hip is worse. Pt stated she has no relief from the injection last time and the Tramadol was no relief. Pt is requesting an injection on her right hip for her upcoming procedure. She requests any other form of treatment for her back. Please advise.

## 2017-03-08 NOTE — Telephone Encounter (Signed)
Last RX: 01/24/17, #[id number] Last OV: 01/24/17 Next OV: 05/04/17 UDS: 12/2016 and due now. Also needs updated CSC. Will place rx and contract at front desk to pick up and collect UDS at that time.   Rx printed and forwarded to PCP for signature.

## 2017-03-09 DIAGNOSIS — F3341 Major depressive disorder, recurrent, in partial remission: Secondary | ICD-10-CM | POA: Insufficient documentation

## 2017-03-09 DIAGNOSIS — F422 Mixed obsessional thoughts and acts: Secondary | ICD-10-CM | POA: Insufficient documentation

## 2017-03-09 HISTORY — DX: Mixed obsessional thoughts and acts: F42.2

## 2017-03-09 HISTORY — DX: Major depressive disorder, recurrent, in partial remission: F33.41

## 2017-03-09 NOTE — Telephone Encounter (Signed)
Notified pt and she voices understanding. States she is currently at [facility], re-establishing with Dr Geronimo Running. Rx and contract placed at front desk for pick up.

## 2017-03-09 NOTE — Telephone Encounter (Signed)
Contacted patient and left message stating upcoming appointment

## 2017-03-10 ENCOUNTER — Other Ambulatory Visit: Payer: 59

## 2017-03-10 ENCOUNTER — Encounter: Payer: Self-pay | Admitting: Family

## 2017-03-10 DIAGNOSIS — F419 Anxiety disorder, unspecified: Principal | ICD-10-CM

## 2017-03-10 DIAGNOSIS — F329 Major depressive disorder, single episode, unspecified: Secondary | ICD-10-CM

## 2017-03-10 DIAGNOSIS — F32A Depression, unspecified: Secondary | ICD-10-CM

## 2017-03-10 NOTE — Telephone Encounter (Signed)
Patient arrived at 6:40pm to pick up Rx mentioned below, contracted signed, lab closed therefore covering physician Dr. Lorelei Pont approved patient coming back to drop of UDS, patient states she will come in today. Contracted forwarded to the lab.

## 2017-03-10 NOTE — Telephone Encounter (Signed)
error:[id number] ° °

## 2017-03-14 ENCOUNTER — Encounter: Payer: Self-pay | Admitting: Family

## 2017-03-14 LAB — PAIN MGMT, PROFILE 8 W/CONF, U
6 Acetylmorphine: NEGATIVE ng/mL (ref <10)
ALCOHOL METABOLITES: POSITIVE ng/mL — AB (ref <500)
ALPHAHYDROXYMIDAZOLAM: NEGATIVE ng/mL (ref <50)
ALPHAHYDROXYTRIAZOLAM: NEGATIVE ng/mL (ref <50)
AMPHETAMINES: NEGATIVE ng/mL (ref <500)
Alphahydroxyalprazolam: NEGATIVE ng/mL (ref <25)
Aminoclonazepam: 267 ng/mL — ABNORMAL HIGH (ref <25)
BUPRENORPHINE, URINE: NEGATIVE ng/mL (ref <5)
Benzodiazepines: POSITIVE ng/mL — AB (ref <100)
COCAINE METABOLITE: NEGATIVE ng/mL (ref <150)
CREATININE: 140.6 mg/dL
ETHYL GLUCURONIDE (ETG): 12391 ng/mL — AB (ref <500)
ETHYL SULFATE (ETS): 3805 ng/mL — AB (ref <100)
Hydroxyethylflurazepam: NEGATIVE ng/mL (ref <50)
Lorazepam: NEGATIVE ng/mL (ref <50)
MDMA: NEGATIVE ng/mL (ref <500)
Marijuana Metabolite: NEGATIVE ng/mL (ref <20)
Nordiazepam: NEGATIVE ng/mL (ref <50)
OXAZEPAM: NEGATIVE ng/mL (ref <50)
OXYCODONE: NEGATIVE ng/mL (ref <100)
Opiates: NEGATIVE ng/mL (ref <100)
Oxidant: NEGATIVE ug/mL (ref <200)
PH: 6.85 (ref 4.5–9.0)
TEMAZEPAM: NEGATIVE ng/mL (ref <50)

## 2017-03-15 ENCOUNTER — Ambulatory Visit: Payer: 59 | Admitting: Physical Medicine & Rehabilitation

## 2017-03-15 ENCOUNTER — Encounter: Payer: 59 | Attending: Physical Medicine & Rehabilitation

## 2017-03-15 ENCOUNTER — Encounter: Payer: Self-pay | Admitting: Physical Medicine & Rehabilitation

## 2017-03-15 VITALS — BP 124/81 | HR 71

## 2017-03-15 DIAGNOSIS — F419 Anxiety disorder, unspecified: Secondary | ICD-10-CM | POA: Insufficient documentation

## 2017-03-15 DIAGNOSIS — M47816 Spondylosis without myelopathy or radiculopathy, lumbar region: Secondary | ICD-10-CM | POA: Diagnosis present

## 2017-03-15 DIAGNOSIS — M533 Sacrococcygeal disorders, not elsewhere classified: Secondary | ICD-10-CM

## 2017-03-15 DIAGNOSIS — F329 Major depressive disorder, single episode, unspecified: Secondary | ICD-10-CM | POA: Diagnosis not present

## 2017-03-15 NOTE — Progress Notes (Signed)
Right sacroiliac injection under fluoroscopic guidance  Indication: Right Low back and buttocks pain not relieved by medication management and other conservative care.  Informed consent was obtained after describing risks and benefits of the procedure with the patient, this includes bleeding, bruising, infection, paralysis and medication side effects. The patient wishes to proceed and has given written consent. The patient was placed in a prone position. The lumbar and sacral area was marked and prepped with Betadine. A 25-gauge 1-1/2 inch needle was inserted into the skin and subcutaneous tissue and 1 mL of 1% lidocaine was injected. Then a 25-gauge 3 inch spinal needle was inserted under fluoroscopic guidance into the Right sacroiliac joint. AP and lateral images were utilized. Omnipaque 180x0.5 mL under live fluoroscopy demonstrated no intravascular uptake. Then a solution containing one ML of 6 mgper ml betamethasone and 2 ML of 1% lidocaine MPF was injected x1.5 mL. Patient tolerated the procedure well. Post procedure instructions were given. Please see post procedure form.  Preinjection pain 8/10 Post inj pain 3/10

## 2017-03-15 NOTE — Progress Notes (Signed)
  PROCEDURE RECORD [facility]   Name: Shannon [name] DOB:1960/05/24 MRN: [id number]  Date:03/15/2017  Physician: Alysia Penna, MD    Nurse/CMA: Malayia Spizzirri CMA / Deon Pilling CMA Allergies:  Allergies  Allergen Reactions  . Buspar [Buspirone]     constipation  . Eszopiclone     Headaches  . Flexeril [Cyclobenzaprine Hcl] Other (See Comments)    Mood swings  . Gabapentin     sedation  . Morphine And Related Other (See Comments)    No relief  . Nsaids Other (See Comments)    GI bleed  . Paroxetine Nausea Only       . Shellfish Allergy Other (See Comments)    skin & mouth tingle  . Tizanidine Hcl Other (See Comments)    Dizziness, mood swings    Consent Signed: Yes.    Is patient diabetic? No.  CBG today? NA  Pregnant: No. LMP: No LMP recorded. (age 55-55)  Anticoagulants: no Anti-inflammatory: no Antibiotics: no  Procedure: Right Sacroiliac injections Position: Prone   Start Time: 12:05pm End Time: 1208pm  Fluoro Time: 10s  RN/CMA Jaylan Duggar CMA     Time 1147am 12:13    BP 124/81 117/71    Pulse 71 66    Respirations 16 16    O2 Sat 96 97    S/S 6 6    Pain Level 8/10 3/10     D/C home with Husband Lennette Bihari, patient A & O X 3, D/C instructions reviewed, and sits independently.

## 2017-03-15 NOTE — Patient Instructions (Signed)
May benefit from [facility] patch or over-the-counter lidocaine patch to the right buttocks area

## 2017-03-17 ENCOUNTER — Ambulatory Visit: Payer: Self-pay | Admitting: Physical Medicine & Rehabilitation

## 2017-03-20 ENCOUNTER — Other Ambulatory Visit: Payer: Self-pay | Admitting: Physical Medicine & Rehabilitation

## 2017-03-21 ENCOUNTER — Other Ambulatory Visit: Payer: Self-pay

## 2017-03-21 ENCOUNTER — Telehealth: Payer: Self-pay | Admitting: Family

## 2017-03-21 NOTE — Telephone Encounter (Signed)
Spoke with pt and answered questions. Pt voices understanding.

## 2017-03-21 NOTE — Telephone Encounter (Signed)
Pt called in to request a call back. Pt says that this is the first time that she received her actual UDS results and she have a question. Pt says that it's not urgent, just whenever CMA have a chance to call back.   CB: [phone]

## 2017-03-21 NOTE — Telephone Encounter (Signed)
Pharmacy had already sent request and was refilled. Mrs Cerullo notified.

## 2017-03-21 NOTE — Telephone Encounter (Signed)
Patient called requesting refill on tramadol.

## 2017-03-31 ENCOUNTER — Telehealth: Payer: Self-pay

## 2017-03-31 NOTE — Telephone Encounter (Signed)
Copied from [place] [id number]. Topic: Inquiry >> Mar 30, 2017  5:27 PM Shannon [name] wrote: Reason for CRM: pt would like to pursue being put on an antidepressant, please contact pt when needed

## 2017-04-04 ENCOUNTER — Encounter: Payer: Self-pay | Admitting: Family

## 2017-04-04 ENCOUNTER — Ambulatory Visit: Payer: 59 | Admitting: Family

## 2017-04-04 VITALS — BP 105/53 | HR 65 | Temp 97.5°F | Resp 16 | Ht 66.0 in | Wt 121.8 lb

## 2017-04-04 DIAGNOSIS — F418 Other specified anxiety disorders: Secondary | ICD-10-CM

## 2017-04-04 DIAGNOSIS — G43911 Migraine, unspecified, intractable, with status migrainosus: Secondary | ICD-10-CM

## 2017-04-04 MED ORDER — CLONAZEPAM 0.5 MG PO TABS: ORAL_TABLET | ORAL | 0 refills | Status: DC

## 2017-04-04 MED ORDER — BUPROPION HCL 75 MG PO TABS: ORAL_TABLET | ORAL | 1 refills | Status: DC

## 2017-04-04 MED ORDER — KETOROLAC TROMETHAMINE 60 MG/2ML IM SOLN
60.0000 mg | Freq: Once | INTRAMUSCULAR | Status: AC
  Administered 2017-04-04: 60 mg via INTRAMUSCULAR

## 2017-04-04 NOTE — Patient Instructions (Signed)
Please begin wellbutrin. Continue counseling with Dr. Lynnell Catalan.

## 2017-04-04 NOTE — Progress Notes (Signed)
Subjective:    Patient ID: Shannon [name], female    DOB: 1961-03-12, 56 y.o.   MRN: [id number]  HPI  Shannon [name] is a 56 yr old female who presents today for routine follow up.  1) Depression/anxiety- last visit she described anxiety related to worries about her son and granddaughter. She is back seeing Dr Mariel Craft (psychology).  Has been on amitryptiline in her past.  Reports feeling sad/numb/weepy. Denies thoughts of hurting herself.  Having "lots of panic attacks." Has had "lots of headaches."  Has had migraine since Friday (light sensitivity/noise).  Daughter is not speaking to her.  Son is now speaking with her. Using klonopin tid most days. This does help.  Had hand tremor on effexor. Declines SSRI due to potential for weight gain.   2) Migraine- reports + migraine today.   Review of Systems    see HPI  Past Medical History:  Diagnosis Date  . Anemia   . Anxiety   . Depression   . Eating disorder    anorexia nervosa in her 20's  . Fainting spell   . Hx of laminectomy 1994   L4-5  . Kidney stones    passed on their own  . Migraine   . Stomach disorder    due to complications with cholecystectomy "almost died"     Social History   Socioeconomic History  . Marital status: Married    Spouse name: Not on file  . Number of children: 3  . Years of education: Not on file  . Highest education level: Not on file  Social Needs  . Financial resource strain: Not on file  . Food insecurity - worry: Not on file  . Food insecurity - inability: Not on file  . Transportation needs - medical: Not on file  . Transportation needs - non-medical: Not on file  Occupational History    Employer: SELF EMPLOYED  Tobacco Use  . Smoking status: Never Smoker  . Smokeless tobacco: Never Used  Substance and Sexual Activity  . Alcohol use: No  . Drug use: Yes  . Sexual activity: Yes    Partners: Male    Birth control/protection: Surgical  Other Topics Concern  . Not on file    Social History Narrative   Regular exercise:  Yes (swim instructor)   Caffeine Use:  1 daily   Married 3 children ages 71 son, 85 son, and 23 daughter   Associates degree                Past Surgical History:  Procedure Laterality Date  . APPENDECTOMY  1976  . CHOLECYSTECTOMY  08/2009   reports history of gallbladder polyps, she reports stents in duct of lushca   . HYSTEROSCOPY  07/2011  . LAMINECTOMY  1994   L4-5  . TONSILLECTOMY  1982  . TUBAL LIGATION      Family History  Problem Relation Age of Onset  . Hypertension Mother   . Diabetes Father   . Hypertension Father   . Hypothyroidism Father   . CAD Father   . Alcohol abuse Maternal Grandmother   . Cancer Paternal Grandfather        lung cancer  . Hypothyroidism Other   . Hashimoto's thyroiditis Sister   . Hypothyroidism Sister   . Neuropathy Sister        chronic inflammatory demyelinating peripheral neuropathy from lyme disease    Allergies  Allergen Reactions  . Buspar [Buspirone]     constipation  .  Eszopiclone     Headaches  . Flexeril [Cyclobenzaprine Hcl] Other (See Comments)    Mood swings  . Gabapentin     sedation  . Morphine And Related Other (See Comments)    No relief  . Nsaids Other (See Comments)    GI bleed  . Paroxetine Nausea Only       . Shellfish Allergy Other (See Comments)    skin & mouth tingle  . Tizanidine Hcl Other (See Comments)    Dizziness, mood swings    Current Outpatient Medications on File Prior to Visit  Medication Sig Dispense Refill  . clonazePAM (KLONOPIN) 0.5 MG tablet TAKE ONE TABLET THREE TIMES A DAY AS NEEDED FOR ANXIETY 70 tablet 0  . traMADol (ULTRAM) 50 MG tablet TAKE TWO TABLETS BY MOUTH THREE TIMES A DAY 180 tablet 4   No current facility-administered medications on file prior to visit.     BP (!) 105/53 (BP Location: Right Arm, Cuff Size: Normal)   Pulse 65   Temp (!) 97.5 F (36.4 C) (Oral)   Resp 16   Ht 5\' 6"  (1.676 m)   Wt 121 lb 12.8  oz (55.2 kg)   LMP 03/20/2017   SpO2 100%   BMI 19.66 kg/m    Objective:   Physical Exam  Constitutional: She is oriented to person, place, and time. She appears well-developed and well-nourished.  Cardiovascular: Normal rate, regular rhythm and normal heart sounds.  No murmur heard. Pulmonary/Chest: Effort normal and breath sounds normal. No respiratory distress. She has no wheezes.  Neurological: She is alert and oriented to person, place, and time.  Psychiatric: Her behavior is normal. Judgment and thought content normal.  Tearful, anxious appearing          Assessment & Plan:  Depression/anxiety- uncontrolled.  We discussed various options to treat her depression and anxiety.  She declines SSRI class due to weight gain.  She has tried Effexor however this caused her a tremor.  She did not feel well on Cymbalta.  She has used amitriptyline in the past however this has a drug interaction with her tramadol of potential increased risk of seizures and serotonin syndrome so this is not a good option at this time.  We discussed Wellbutrin for her depression.  I think this is her best option, however I am concerned that it may impact her insomnia.  She will try to take her second dose a little bit earlier in the evening and continue melatonin.  We will continue her clonazepam for anxiety and she is to continue her counseling.  Plan to bring her back in 1 month.  Migraine-IM toradol given today.

## 2017-04-05 ENCOUNTER — Ambulatory Visit: Payer: 59 | Admitting: Physical Medicine & Rehabilitation

## 2017-04-21 ENCOUNTER — Telehealth: Payer: Self-pay | Admitting: Family

## 2017-04-21 NOTE — Telephone Encounter (Signed)
Copied from [place]. Topic: Quick Communication - See Telephone Encounter >> Apr 21, 2017  3:26 PM Oneta Rack wrote: CRM for notification. See Telephone encounter for:   04/21/17.  Relation to pt: self  Call back [facility]:    [place] [phone] (Phone) [phone] (Fax)     Reason for call:  Patient requesting MG increase buPROPion (WELLBUTRIN) 75 MG tablet, patient states medication not working, please advise

## 2017-04-22 NOTE — Telephone Encounter (Signed)
Attempted to reach pt again. Left detailed message on voicemail that we need additional information regarding symptoms that make her feel that dose needs to be changed and if she is having any thoughts of harming herself or others that she should seek help right away in the ER. Awaiting return call.

## 2017-04-22 NOTE — Telephone Encounter (Signed)
Pt called back. Denies SI/HI.  States she feels numb, very sad and weepy. Doesn't want to be around anyone or leave the house. States family issues have "come to a head over the last week and most of her children will not be coming to see her over the Holidays".  States at last visit she and PCP were trying to see the lowest dose of Effexor she could take but is now wanting to know if dose can be increased again?  Has a therapist that she saw last week and was on the call list for this week but he had no cancellations and he is out of the office next week. Please advise?

## 2017-04-22 NOTE — Telephone Encounter (Signed)
Attempted to reach pt for further information. Please find out what symptoms pt is having that makes her feel like medication is not working and to see if she needs office evaluation today.

## 2017-04-23 MED ORDER — BUPROPION HCL ER (XL) 300 MG PO TB24: 300.0000 mg | ORAL_TABLET | Freq: Every day | ORAL | 2 refills | Status: DC

## 2017-04-23 NOTE — Telephone Encounter (Signed)
I spoke personally with the patient last night.  She is not actively suicidal.  She is just very sad about the fact that she will be with all of her children at Christmas time.  Feels that this is very situational.  She is requesting that we increase her Wellbutrin.  She has not had any side effects from Wellbutrin.  I advised her that I will increase her to 300 mg extended release once daily.  She has follow-up with me in early January and understands to go to the emergency department if she develops thoughts of hurting herself or others.

## 2017-05-04 ENCOUNTER — Ambulatory Visit: Payer: 59 | Admitting: Family

## 2017-05-04 ENCOUNTER — Encounter: Payer: Self-pay | Admitting: Family

## 2017-05-04 VITALS — BP 115/78 | HR 79 | Temp 97.6°F | Resp 16 | Ht 66.0 in | Wt 121.2 lb

## 2017-05-04 DIAGNOSIS — F418 Other specified anxiety disorders: Secondary | ICD-10-CM

## 2017-05-04 DIAGNOSIS — J069 Acute upper respiratory infection, unspecified: Secondary | ICD-10-CM

## 2017-05-04 MED ORDER — CLONAZEPAM 0.5 MG PO TABS: ORAL_TABLET | ORAL | 0 refills | Status: DC

## 2017-05-04 NOTE — Patient Instructions (Signed)
Continue current dose of wellbutrin.  Let me know if your viral symptoms do not continue to improve.

## 2017-05-04 NOTE — Progress Notes (Signed)
Subjective:    Patient ID: Shannon [name], female    DOB: 07-Nov-1960, 57 y.o.   MRN: [id number]  HPI  Shannon [name] is a 57 yr old female who presents today for follow up of her depression.  Last visit she reported frequent panic attacks.  She also describes worsening depression symptoms.  She was continued on clonazepam and advised to continue counseling.  She was started on Wellbutrin.  She noted increased sadness prior to Christmas.  At that time we increased her Wellbutrin to 300 mg extended release.No longer crying, feeling much better from a depressino standpoint.   Reports fatigue/cough, head congestion, ear fullness and sore throat since christmas.  Reports that she has mostly been in the bed x 10 days.  Bilateral tinnitus. Overall feeling "100% better than I was." Did have associated myalgias which have since resolved but no fever.    Review of Systems    see HPI  Past Medical History:  Diagnosis Date  . Anemia   . Anxiety   . Depression   . Eating disorder    anorexia nervosa in her 20's  . Fainting spell   . Hx of laminectomy 1994   L4-5  . Kidney stones    passed on their own  . Migraine   . Stomach disorder    due to complications with cholecystectomy "almost died"     Social History   Socioeconomic History  . Marital status: Married    Spouse name: Not on file  . Number of children: 3  . Years of education: Not on file  . Highest education level: Not on file  Social Needs  . Financial resource strain: Not on file  . Food insecurity - worry: Not on file  . Food insecurity - inability: Not on file  . Transportation needs - medical: Not on file  . Transportation needs - non-medical: Not on file  Occupational History    Employer: SELF EMPLOYED  Tobacco Use  . Smoking status: Never Smoker  . Smokeless tobacco: Never Used  Substance and Sexual Activity  . Alcohol use: No  . Drug use: Yes  . Sexual activity: Yes    Partners: Male    Birth  control/protection: Surgical  Other Topics Concern  . Not on file  Social History Narrative   Regular exercise:  Yes (swim instructor)   Caffeine Use:  1 daily   Married 3 children ages 37 son, 80 son, and 43 daughter   Associates degree                Past Surgical History:  Procedure Laterality Date  . APPENDECTOMY  1976  . CHOLECYSTECTOMY  08/2009   reports history of gallbladder polyps, she reports stents in duct of lushca   . HYSTEROSCOPY  07/2011  . LAMINECTOMY  1994   L4-5  . TONSILLECTOMY  1982  . TUBAL LIGATION      Family History  Problem Relation Age of Onset  . Hypertension Mother   . Diabetes Father   . Hypertension Father   . Hypothyroidism Father   . CAD Father   . Alcohol abuse Maternal Grandmother   . Cancer Paternal Grandfather        lung cancer  . Hypothyroidism Other   . Hashimoto's thyroiditis Sister   . Hypothyroidism Sister   . Neuropathy Sister        chronic inflammatory demyelinating peripheral neuropathy from lyme disease    Allergies  Allergen Reactions  .  Buspar [Buspirone]     constipation  . Eszopiclone     Headaches  . Flexeril [Cyclobenzaprine Hcl] Other (See Comments)    Mood swings  . Gabapentin     sedation  . Morphine And Related Other (See Comments)    No relief  . Nsaids Other (See Comments)    GI bleed  . Paroxetine Nausea Only       . Shellfish Allergy Other (See Comments)    skin & mouth tingle  . Tizanidine Hcl Other (See Comments)    Dizziness, mood swings    Current Outpatient Medications on File Prior to Visit  Medication Sig Dispense Refill  . buPROPion (WELLBUTRIN XL) 300 MG 24 hr tablet Take 1 tablet (300 mg total) by mouth daily. 30 tablet 2  . clonazePAM (KLONOPIN) 0.5 MG tablet TAKE ONE TABLET THREE TIMES A DAY AS NEEDED FOR ANXIETY 80 tablet 0  . traMADol (ULTRAM) 50 MG tablet TAKE TWO TABLETS BY MOUTH THREE TIMES A DAY 180 tablet 4   No current facility-administered medications on file prior  to visit.     BP 115/78 (BP Location: Right Arm, Cuff Size: Normal)   Pulse 79   Temp 97.6 F (36.4 C) (Oral)   Resp 16   Ht 5\' 6"  (1.676 m)   Wt 121 lb 3.2 oz (55 kg)   LMP 03/19/2017   SpO2 100%   BMI 19.56 kg/m    Objective:   Physical Exam  Constitutional: She is oriented to person, place, and time. She appears well-developed and well-nourished.  HENT:  Head: Normocephalic and atraumatic.  Right Ear: Tympanic membrane and ear canal normal.  Left Ear: Tympanic membrane and ear canal normal.  Mouth/Throat: No oropharyngeal exudate, posterior oropharyngeal edema or posterior oropharyngeal erythema.  Eyes: No scleral icterus.  Cardiovascular: Normal rate, regular rhythm and normal heart sounds.  No murmur heard. Pulmonary/Chest: Effort normal and breath sounds normal. No respiratory distress. She has no wheezes.  Musculoskeletal: She exhibits no edema.  Lymphadenopathy:    She has no cervical adenopathy.  Neurological: She is alert and oriented to person, place, and time.  Skin: Skin is warm and dry.  Psychiatric: She has a normal mood and affect. Her behavior is normal. Judgment and thought content normal.          Assessment & Plan:  Depression/anxiety- improved. Will continue current dose of wellbutrin. She has an appointment with her counselor today.    Viral URI-  Sounds like she might have had the flu. Symptoms are improving. I have advised her to let me know if symptoms do not continue to improve.

## 2017-05-06 ENCOUNTER — Ambulatory Visit: Payer: 59 | Admitting: Physical Medicine & Rehabilitation

## 2017-05-09 ENCOUNTER — Telehealth: Payer: Self-pay | Admitting: *Deleted

## 2017-05-09 MED ORDER — AMOXICILLIN-POT CLAVULANATE 875-125 MG PO TABS: 1.0000 | ORAL_TABLET | Freq: Two times a day (BID) | ORAL | 0 refills | Status: DC

## 2017-05-09 NOTE — Telephone Encounter (Signed)
rx sent for augmentin. Please advise pt that if  symptoms worsen or if not improved in 3-4 days please schedule OV.

## 2017-05-09 NOTE — Telephone Encounter (Signed)
Copied from [place]. Topic: General - Other >> May 09, 2017  8:41 AM Carolyn Stare wrote:  Pt call to say the doctor told her to call back if her symptons changed and she call today to say she think she has a sinus infection and is asking if a antibiotic can be called in   [place] at [employer]

## 2017-05-10 ENCOUNTER — Ambulatory Visit: Payer: 59 | Admitting: Physical Medicine & Rehabilitation

## 2017-05-10 ENCOUNTER — Encounter: Payer: 59 | Attending: Physical Medicine & Rehabilitation

## 2017-05-10 ENCOUNTER — Encounter: Payer: Self-pay | Admitting: Physical Medicine & Rehabilitation

## 2017-05-10 VITALS — BP 131/84 | HR 83 | Resp 16

## 2017-05-10 DIAGNOSIS — F329 Major depressive disorder, single episode, unspecified: Secondary | ICD-10-CM | POA: Insufficient documentation

## 2017-05-10 DIAGNOSIS — F419 Anxiety disorder, unspecified: Secondary | ICD-10-CM | POA: Diagnosis not present

## 2017-05-10 DIAGNOSIS — M533 Sacrococcygeal disorders, not elsewhere classified: Secondary | ICD-10-CM | POA: Diagnosis not present

## 2017-05-10 DIAGNOSIS — M47816 Spondylosis without myelopathy or radiculopathy, lumbar region: Secondary | ICD-10-CM

## 2017-05-10 DIAGNOSIS — Z1231 Encounter for screening mammogram for malignant neoplasm of breast: Secondary | ICD-10-CM | POA: Diagnosis not present

## 2017-05-10 NOTE — Progress Notes (Signed)
Subjective:    Patient ID: Shannon [name], female    DOB: [birth year], 57 y.o.   MRN: [id number]  HPI  Right sacroiliac injection 11/13 helpful, still working Stretching and doing inversion table  Wellbutrin started by PCP, patient is seeing a psychologist for depression. Sleep is better new mattress that can be elevated  Has occasional mid back pain but this is not severe.  Patient has had bilateral L3-L4-L5 medial branch blocks that were not helpful for her pain in October however it appears that the sacroiliac injection helped this pain which was more in the buttocks area.  She had previous medial branch blocks on the right side L3-L4 as well as right L5 dorsal ramus injection which was helpful in reducing the mid lumbar pain. Pain Inventory Average Pain 3 Pain Right Now 3 My pain is dull  In the last 24 hours, has pain interfered with the following? General activity 1 Relation with others 1 Enjoyment of life 1 What TIME of day is your pain at its worst? morning, night Sleep (in general) Good  Pain is worse with: sitting, inactivity and some activites Pain improves with: medication and injections Relief from Meds: 8  Mobility walk without assistance ability to climb steps?  yes do you drive?  yes Do you have any goals in this area?  no  Function Do you have any goals in this area?  no  Neuro/Psych depression anxiety  Prior Studies Any changes since last visit?  no  Physicians involved in your care Any changes since last visit?  no   Family History  Problem Relation Age of Onset  . Hypertension Mother   . Diabetes Father   . Hypertension Father   . Hypothyroidism Father   . CAD Father   . Alcohol abuse Maternal Grandmother   . Cancer Paternal Grandfather        lung cancer  . Hypothyroidism Other   . Hashimoto's thyroiditis Sister   . Hypothyroidism Sister   . Neuropathy Sister        chronic inflammatory demyelinating peripheral neuropathy from lyme  disease   Social History   Socioeconomic History  . Marital status: Married    Spouse name: None  . Number of children: 3  . Years of education: None  . Highest education level: None  Social Needs  . Financial resource strain: None  . Food insecurity - worry: None  . Food insecurity - inability: None  . Transportation needs - medical: None  . Transportation needs - non-medical: None  Occupational History    Employer: SELF EMPLOYED  Tobacco Use  . Smoking status: Never Smoker  . Smokeless tobacco: Never Used  Substance and Sexual Activity  . Alcohol use: No  . Drug use: Yes  . Sexual activity: Yes    Partners: Male    Birth control/protection: Surgical  Other Topics Concern  . None  Social History Narrative   Regular exercise:  Yes (Cabin crew)   Caffeine Use:  1 daily   Married 3 children ages 72 son, 36 son, and 53 daughter   Associates degree               Past Surgical History:  Procedure Laterality Date  . APPENDECTOMY  1976  . CHOLECYSTECTOMY  08/2009   reports history of gallbladder polyps, she reports stents in duct of lushca   . HYSTEROSCOPY  07/2011  . LAMINECTOMY  1994   L4-5  . TONSILLECTOMY  1982  . TUBAL  LIGATION     Past Medical History:  Diagnosis Date  . Anemia   . Anxiety   . Depression   . Eating disorder    anorexia nervosa in her 20's  . Fainting spell   . Hx of laminectomy 1994   L4-5  . Kidney stones    passed on their own  . Migraine   . Stomach disorder    due to complications with cholecystectomy "almost died"   BP 131/84 (BP Location: Left Arm, Patient Position: Sitting, Cuff Size: Normal)   Pulse 83   Resp 16   SpO2 97%   Opioid Risk Score:   Fall Risk Score:  `1  Depression screen PHQ 2/9  Depression screen [facility] 2/9 04/04/2017 01/24/2017 07/15/2015 06/27/2015 06/19/2015  Decreased Interest 2 1 1 1 1   Down, Depressed, Hopeless 2 2 0 0 0  PHQ - 2 Score 4 3 1 1 1   Altered sleeping 3 3 - - -  Tired, decreased  energy 1 2 - - -  Change in appetite 3 3 - - -  Feeling bad or failure about yourself  2 1 - - -  Trouble concentrating 2 3 - - -  Moving slowly or fidgety/restless 0 1 - - -  Suicidal thoughts 0 0 - - -  PHQ-9 Score 15 16 - - -  Difficult doing work/chores Somewhat difficult - - - -    Review of Systems  Constitutional: Negative.   HENT: Negative.   Eyes: Negative.   Respiratory: Negative.   Cardiovascular: Negative.   Gastrointestinal: Negative.   Endocrine: Negative.   Genitourinary: Negative.   Musculoskeletal: Positive for arthralgias and back pain.  Skin: Negative.   Neurological: Negative.   Hematological: Negative.   Psychiatric/Behavioral: Positive for dysphoric mood. The patient is nervous/anxious.   All other systems reviewed and are negative.      Objective:   Physical Exam  Constitutional: She is oriented to person, place, and time. She appears well-developed and well-nourished. No distress.  HENT:  Head: Normocephalic and atraumatic.  Eyes: Conjunctivae and EOM are normal. Pupils are equal, round, and reactive to light.  Neurological: She is alert and oriented to person, place, and time.  Skin: She is not diaphoretic.  Psychiatric: She has a normal mood and affect.  Nursing note and vitals reviewed.  Motor strength is 5/5 bilateral hip flexor knee extensor ankle dorsiflexor Lumbar range of motion is full with lection extension is limited to 75% accompanied by pain in the right mid to lower lumbar area She has 50% lateral bending toward the right and 75% to the left Patient has mild tenderness palpation over the bilateral greater trochanters but negative pain over the lumbar paraspinals or at the PSIS area Gait without evidence of foot drag or knee instability       Assessment & Plan:  1.  Chronic right-sided buttock pain which has improved after sacroiliac injection.  Duration thus far as 2 months.  We discussed that as long as she gets 2-3 months relief  with this procedure it would be reasonable to continue this every 3 months as needed. We discussed that radiofrequency neurotomy may be an option if duration of fact falls 2 weeks rather than months. 2.  Right L4-5 L5-S1 spondylosis improvements after L3-L4 medial branch as well as L5 dorsal ramus injections.  If this pain recurs we can repeat medial branch blocks.  Once again discussed possibility of radiofrequency to the same nerves for more durable  effect.  At this point she does not have constant pain in this area. We discussed resuming her swimming workouts which she will start doing in the next couple weeks We will need to see the patient back in June if she does not have a injection prior to that time.

## 2017-05-10 NOTE — Patient Instructions (Signed)
If center of back is jurting call for L3-4-5 Medial branch blocks If buttocks is hurting call for R sacroiliac injection

## 2017-05-13 ENCOUNTER — Other Ambulatory Visit: Payer: Self-pay

## 2017-05-13 ENCOUNTER — Emergency Department (HOSPITAL_BASED_OUTPATIENT_CLINIC_OR_DEPARTMENT_OTHER): Payer: 59

## 2017-05-13 ENCOUNTER — Encounter (HOSPITAL_BASED_OUTPATIENT_CLINIC_OR_DEPARTMENT_OTHER): Payer: Self-pay | Admitting: Emergency Medicine

## 2017-05-13 ENCOUNTER — Emergency Department (HOSPITAL_BASED_OUTPATIENT_CLINIC_OR_DEPARTMENT_OTHER)
Admission: EM | Admit: 2017-05-13 | Discharge: 2017-05-13 | Disposition: A | Discharge status: Home / Self Care | Payer: 59 | Attending: Emergency Medicine | Admitting: Emergency Medicine

## 2017-05-13 ENCOUNTER — Ambulatory Visit: Payer: Self-pay | Admitting: *Deleted

## 2017-05-13 DIAGNOSIS — S0990XA Unspecified injury of head, initial encounter: Secondary | ICD-10-CM | POA: Diagnosis not present

## 2017-05-13 DIAGNOSIS — Z79899 Other long term (current) drug therapy: Secondary | ICD-10-CM | POA: Insufficient documentation

## 2017-05-13 DIAGNOSIS — W1789XA Other fall from one level to another, initial encounter: Secondary | ICD-10-CM | POA: Insufficient documentation

## 2017-05-13 DIAGNOSIS — Y999 Unspecified external cause status: Secondary | ICD-10-CM | POA: Insufficient documentation

## 2017-05-13 DIAGNOSIS — R42 Dizziness and giddiness: Secondary | ICD-10-CM | POA: Diagnosis not present

## 2017-05-13 DIAGNOSIS — Y929 Unspecified place or not applicable: Secondary | ICD-10-CM | POA: Diagnosis not present

## 2017-05-13 DIAGNOSIS — R51 Headache: Secondary | ICD-10-CM | POA: Insufficient documentation

## 2017-05-13 DIAGNOSIS — Y939 Activity, unspecified: Secondary | ICD-10-CM | POA: Insufficient documentation

## 2017-05-13 MED ORDER — ONDANSETRON 4 MG PO TBDP: 4.0000 mg | ORAL_TABLET | Freq: Three times a day (TID) | ORAL | 0 refills | Status: DC | PRN

## 2017-05-13 MED ORDER — MECLIZINE HCL 25 MG PO TABS: 25.0000 mg | ORAL_TABLET | Freq: Three times a day (TID) | ORAL | 0 refills | Status: DC | PRN

## 2017-05-13 MED ORDER — ONDANSETRON 4 MG PO TBDP
4.0000 mg | ORAL_TABLET | Freq: Once | ORAL | Status: AC
  Administered 2017-05-13: 4 mg via ORAL
  Filled 2017-05-13: qty 1

## 2017-05-13 MED FILL — MECLIZINE 25 MG TABLET: 25 | 10 days supply | Qty: 30 | Fill #0

## 2017-05-13 MED FILL — ONDANSETRON ODT 4 MG TABLET: 4 | 6 days supply | Qty: 20 | Fill #0

## 2017-05-13 NOTE — Telephone Encounter (Signed)
Noted and agree. 

## 2017-05-13 NOTE — ED Triage Notes (Signed)
Pt reports falling backwards on Wednesday when stepping over a baby gate. Pt hit head on corner of [place] cabinet. Pt states she felt fine until yesterday when she developed dizziness, nausea, seeing spots, and headache. Pt reports dizziness is worse with movement.

## 2017-05-13 NOTE — ED Notes (Signed)
Patient transported to CT 

## 2017-05-13 NOTE — Discharge Instructions (Signed)
See [name] for recheck in 3-4 days for recheck.

## 2017-05-13 NOTE — ED Provider Notes (Signed)
[place] EMERGENCY DEPARTMENT Provider Note   CSN: [id number] Arrival date & time: 05/13/17  8413     History   Chief Complaint Chief Complaint  Patient presents with  . Head Injury    HPI Shannon [name] is a 57 y.o. female.  The history is provided by the patient. No language interpreter was used.  Head Injury   The incident occurred 2 days ago. She came to the ER via walk-in. The injury mechanism was a direct blow. There was no loss of consciousness. There was no blood loss. The quality of the pain is described as dull. The pain is moderate. The pain has been constant since the injury. She was found conscious by EMS personnel. She has tried nothing for the symptoms. The treatment provided no relief.  Pt complains of hitting her head on a [place] cabinet.  Pt reports she tripped over a baby gate. Pt complains of feeling like the room is spinning.  Pt has had vertigo in the past that felt similar however she was concerned because of timing of symptoms in relation to hitting her head   Past Medical History:  Diagnosis Date  . Anemia   . Anxiety   . Depression   . Eating disorder    anorexia nervosa in her 20's  . Fainting spell   . Hx of laminectomy 1994   L4-5  . Kidney stones    passed on their own  . Migraine   . Stomach disorder    due to complications with cholecystectomy "almost died"    Patient Active Problem List   Diagnosis Date Noted  . Cervical spondylosis without myelopathy 02/12/2016  . Spondylosis of lumbar region without myelopathy or radiculopathy 07/15/2015  . Chronic midline posterior neck pain 06/19/2015  . Sacroiliac dysfunction 06/19/2015  . Lumbar radicular pain 06/19/2015  . Postlaminectomy syndrome, lumbar region 06/19/2015  . Rhus dermatitis 10/25/2014  . Raynaud's disease 03/23/2013  . Memory loss 02/19/2013  . Major depression 12/23/2012  . Anxiety state 12/23/2012  . Unspecified constipation 08/23/2012  . Migraine  02/23/2012  . Routine general medical examination at a health care facility 02/15/2012  . Abnormal EKG 02/15/2012  . Chronic pain 02/17/2011  . ASCUS on Pap smear 02/16/2011  . Anxiety and depression 12/30/2010    Past Surgical History:  Procedure Laterality Date  . APPENDECTOMY  1976  . CHOLECYSTECTOMY  08/2009   reports history of gallbladder polyps, she reports stents in duct of lushca   . HYSTEROSCOPY  07/2011  . LAMINECTOMY  1994   L4-5  . TONSILLECTOMY  1982  . TUBAL LIGATION      OB History    No data available       Home Medications    Prior to Admission medications   Medication Sig Start Date End Date Taking? Authorizing Provider  amoxicillin-clavulanate (AUGMENTIN) 875-125 MG tablet Take 1 tablet by mouth 2 (two) times daily. 05/09/17   [name], NP  buPROPion (WELLBUTRIN XL) 300 MG 24 hr tablet Take 1 tablet (300 mg total) by mouth daily. 04/23/17   [name], NP  clonazePAM (KLONOPIN) 0.5 MG tablet TAKE ONE TABLET THREE TIMES A DAY AS NEEDED FOR ANXIETY 05/04/17   [name], NP  traMADol Veatrice Bourbon) 50 MG tablet TAKE TWO TABLETS BY MOUTH THREE TIMES A DAY 03/21/17   Kirsteins, Luanna Salk, MD    Family History Family History  Problem Relation Age of Onset  . Hypertension Mother   . Diabetes  Father   . Hypertension Father   . Hypothyroidism Father   . CAD Father   . Alcohol abuse Maternal Grandmother   . Cancer Paternal Grandfather        lung cancer  . Hypothyroidism Other   . Hashimoto's thyroiditis Sister   . Hypothyroidism Sister   . Neuropathy Sister        chronic inflammatory demyelinating peripheral neuropathy from lyme disease    Social History Social History   Tobacco Use  . Smoking status: Never Smoker  . Smokeless tobacco: Never Used  Substance Use Topics  . Alcohol use: No  . Drug use: Yes     Allergies   Buspar [buspirone]; Eszopiclone; Flexeril [cyclobenzaprine hcl]; Gabapentin; Morphine and related;  Nsaids; Paroxetine; Shellfish allergy; and Tizanidine hcl   Review of Systems Review of Systems  All other systems reviewed and are negative.    Physical Exam Updated Vital Signs BP (!) 125/95 (BP Location: Left Arm)   Pulse 88   Temp 97.8 F (36.6 C) (Oral)   Resp 16   Ht 5\' 6"  (1.676 m)   Wt 54.9 kg (121 lb)   LMP 03/19/2017   SpO2 100%   BMI 19.53 kg/m   Physical Exam  Constitutional: She appears well-developed and well-nourished. No distress.  HENT:  Head: Normocephalic and atraumatic.  Right Ear: External ear normal.  Left Ear: External ear normal.  Eyes: Conjunctivae and EOM are normal. Pupils are equal, round, and reactive to light.  Neck: Neck supple.  Cardiovascular: Normal rate and regular rhythm.  No murmur heard. Pulmonary/Chest: Effort normal and breath sounds normal. No respiratory distress.  Abdominal: Soft. There is no tenderness.  Musculoskeletal: She exhibits no edema.  Neurological: She is alert.  Skin: Skin is warm and dry.  Psychiatric: She has a normal mood and affect.  Nursing note and vitals reviewed.    ED Treatments / Results  Labs (all labs ordered are listed, but only abnormal results are displayed) Labs Reviewed - No data to display  EKG  EKG Interpretation None       Radiology Ct Head Wo Contrast  Result Date: 05/13/2017 CLINICAL DATA:  Headache. Light sensitivity. Nausea. Dizziness. Symptoms after falling backwards and hitting left occipital scalp yesterday. EXAM: CT HEAD WITHOUT CONTRAST TECHNIQUE: Contiguous axial images were obtained from the base of the skull through the vertex without intravenous contrast. COMPARISON:  None. FINDINGS: Brain: No acute infarct, hemorrhage, or mass lesion is present. The ventricles are of normal size. No significant extraaxial fluid collection is present. Vascular: No hyperdense vessel or unexpected calcification. Skull: Calvarium is intact. No focal lytic or blastic lesions are present. There  is soft tissue swelling in the occipital scalp, just to the left of midline. There is no underlying fracture. Sinuses/Orbits: The paranasal sinuses and mastoid air cells are clear. Globes and orbits are within normal limits. IMPRESSION: 1. Normal CT appearance of the brain. 2. Mild soft tissue swelling in the occipital scalp, extending to the left. No underlying fracture. Electronically Signed   By: San Morelle M.D.   On: 05/13/2017 10:02    Procedures Procedures (including critical care time)  Medications Ordered in ED Medications - No data to display   Initial Impression / Assessment and Plan / ED Course  I have reviewed the triage vital signs and the nursing notes.  Pertinent labs & imaging results that were available during my care of the patient were reviewed by me and considered in my medical decision  making (see chart for details).     MDM  Ct scan normal  Symptoms most consistent with vertigo.  Pt given RX for antivert.  She is advised to follow up with [facility] for recheck next week.   Final Clinical Impressions(s) / ED Diagnoses   Final diagnoses:  Minor head injury, initial encounter  Vertigo    ED Discharge Orders        Ordered    meclizine (ANTIVERT) 25 MG tablet  3 times daily PRN     05/13/17 1023    An After Visit Summary was printed and given to the patient.    Sidney Ace 05/13/17 1025    Tanna Furry, MD 05/17/17 [phone]

## 2017-05-13 NOTE — Telephone Encounter (Signed)
Called in c/o dizziness, seeing spots, and nausea that started last night.   On Wednesday she was stepping over a dog gate while holding her granddaughter (75 mo old) and her foot got caught.   She fell straight backwards hitting the back left side of her head on the corner of the dinette table.   No open wounds.  A small tender lump only.  This morning she is experiencing spinning of the room.   As long as she lays flat on her back she is not dizzy or spinning.   She is unsteady walking around.   Her husband is with her.   Her daughter in law is on the way now to take her to [facility] ED. I instructed her to call 911 if her symptoms changed or became worse before her daughter in law got there.   She verbalized understanding. I routed a not to Dr. Inda Castle making her aware of the situation. Reason for Disposition . Sounds like a serious injury to the triager  Answer Assessment - Initial Assessment Questions 1. MECHANISM: "How did the injury happen?" For falls, ask: "What height did you fall from?" and "What surface did you fall against?"     On Wed. I fell straight backwards and hit my head on the corner of the dinette table.   I hit the left side of my head on the corner.  I felt dark right after it happened but didn't pass oiut. 2. ONSET: "When did the injury happen?" (Minutes or hours ago)      Wednesday 3. NEUROLOGIC SYMPTOMS: "Was there any loss of consciousness?" "Are there any other neurological symptoms?"      I went dark right after it happened but I didn't pass out.   My daughter in law was with me. 4. MENTAL STATUS: "Does the person know who he is, who you are, and where he is?"      Dizzy, seeing spots, nausea started last night.   This morning aboiut 4:45 I rolled over in bed and everything was spinning.   I don't experience the spinning when laying flat on my back.   I have a headache. 5. LOCATION: "What part of the head was hit?"      Back of left side of my head.   I was  trying to step over a dog gate.   My foot got caught and I fell straight backwards. 6. SCALP APPEARANCE: "What does the scalp look like? Is it bleeding now?" If so, ask: "Is it difficult to stop?"      No blood.   Small tender bump.   Still hurts now 7. SIZE: For cuts, bruises, or swelling, ask: "How large is it?" (e.g., inches or centimeters)      None 8. PAIN: "Is there any pain?" If so, ask: "How bad is it?"  (e.g., Scale 1-10; or mild, moderate, severe)     Headache.   I also get migraines and I'm getting over a sinus infection too. 9. TETANUS: For any breaks in the skin, ask: "When was the last tetanus booster?"     No breaks. 10. OTHER SYMPTOMS: "Do you have any other symptoms?" (e.g., neck pain, vomiting)      My neck is a little sore but nothing with my back. 11. PREGNANCY: "Is there any chance you are pregnant?" "When was your last menstrual period?"       No  Protocols used: HEAD INJURY-A-AH

## 2017-05-16 ENCOUNTER — Other Ambulatory Visit: Payer: Self-pay | Admitting: Obstetrics and Gynecology

## 2017-05-16 DIAGNOSIS — R928 Other abnormal and inconclusive findings on diagnostic imaging of breast: Secondary | ICD-10-CM

## 2017-05-18 ENCOUNTER — Ambulatory Visit
Admission: RE | Admit: 2017-05-18 | Discharge: 2017-05-18 | Disposition: A | Discharge status: Home / Self Care | Payer: 59 | Source: Ambulatory Visit | Attending: Obstetrics and Gynecology | Admitting: Obstetrics and Gynecology

## 2017-05-18 ENCOUNTER — Ambulatory Visit: Payer: 59 | Admitting: Family

## 2017-05-18 ENCOUNTER — Encounter: Payer: Self-pay | Admitting: Family

## 2017-05-18 VITALS — BP 120/71 | HR 76 | Temp 98.1°F | Resp 16 | Ht 66.0 in | Wt 128.0 lb

## 2017-05-18 DIAGNOSIS — S060X0A Concussion without loss of consciousness, initial encounter: Secondary | ICD-10-CM | POA: Diagnosis not present

## 2017-05-18 DIAGNOSIS — R922 Inconclusive mammogram: Secondary | ICD-10-CM | POA: Diagnosis not present

## 2017-05-18 DIAGNOSIS — R928 Other abnormal and inconclusive findings on diagnostic imaging of breast: Secondary | ICD-10-CM

## 2017-05-18 DIAGNOSIS — N6489 Other specified disorders of breast: Secondary | ICD-10-CM | POA: Diagnosis not present

## 2017-05-18 NOTE — Patient Instructions (Signed)
Continue meclizine as needed for dizziness and odansetron (zofran) as needed for nausea. Call if new/worsening symptoms or if symptoms are not at least some better in 2 weeks.

## 2017-05-18 NOTE — Progress Notes (Signed)
Subjective:    Patient ID: Shannon [name], female    DOB: [birth year], 57 y.o.   MRN: [id number]  HPI   Patient is a 57 yr old female who presents today following a fall/head injury.   Fall happened last Wednesday on 05/11/17.  She tripped over a baby gate holding her granddaughter and fell backwards striking her head on the heard floor.  Reports that she woke the next day with severe vertigo.  She presents to the emergency department on May 13, 2017.  ER record is reviewed.  She underwent a CT of the head during that visit which was unremarkable.  She was given a prescription for meclizine for her dizziness and ondansetron as needed for nausea.  Still has tenderness on the left posterior head along with headache.  She reports ongoing throbbing headache.  Mild assoviated photophobia.  + nausea.  Dizziness is improved by use of PRN meclizine.  She reports that she stopped meclizine for 24 hours and her dizziness symptoms returned.   Review of Systems See HPI  Past Medical History:  Diagnosis Date  . Anemia   . Anxiety   . Depression   . Eating disorder    anorexia nervosa in her 20's  . Fainting spell   . Hx of laminectomy 1994   L4-5  . Kidney stones    passed on their own  . Migraine   . Stomach disorder    due to complications with cholecystectomy "almost died"     Social History   Socioeconomic History  . Marital status: Married    Spouse name: Not on file  . Number of children: 3  . Years of education: Not on file  . Highest education level: Not on file  Social Needs  . Financial resource strain: Not on file  . Food insecurity - worry: Not on file  . Food insecurity - inability: Not on file  . Transportation needs - medical: Not on file  . Transportation needs - non-medical: Not on file  Occupational History    Employer: SELF EMPLOYED  Tobacco Use  . Smoking status: Never Smoker  . Smokeless tobacco: Never Used  Substance and Sexual Activity  . Alcohol  use: No  . Drug use: Yes  . Sexual activity: Yes    Partners: Male    Birth control/protection: Surgical  Other Topics Concern  . Not on file  Social History Narrative   Regular exercise:  Yes (swim instructor)   Caffeine Use:  1 daily   Married 3 children ages 70 son, 67 son, and 17 daughter   Associates degree                Past Surgical History:  Procedure Laterality Date  . APPENDECTOMY  1976  . BREAST BIOPSY Right   . CHOLECYSTECTOMY  08/2009   reports history of gallbladder polyps, she reports stents in duct of lushca   . HYSTEROSCOPY  07/2011  . LAMINECTOMY  1994   L4-5  . TONSILLECTOMY  1982  . TUBAL LIGATION      Family History  Problem Relation Age of Onset  . Hypertension Mother   . Diabetes Father   . Hypertension Father   . Hypothyroidism Father   . CAD Father   . Alcohol abuse Maternal Grandmother   . Cancer Paternal Grandfather        lung cancer  . Hypothyroidism Other   . Hashimoto's thyroiditis Sister   . Hypothyroidism Sister   .  Neuropathy Sister        chronic inflammatory demyelinating peripheral neuropathy from lyme disease    Allergies  Allergen Reactions  . Buspar [Buspirone]     constipation  . Eszopiclone     Headaches  . Flexeril [Cyclobenzaprine Hcl] Other (See Comments)    Mood swings  . Gabapentin     sedation  . Morphine And Related Other (See Comments)    No relief  . Nsaids Other (See Comments)    GI bleed  . Paroxetine Nausea Only       . Shellfish Allergy Other (See Comments)    skin & mouth tingle  . Tizanidine Hcl Other (See Comments)    Dizziness, mood swings    Current Outpatient Medications on File Prior to Visit  Medication Sig Dispense Refill  . buPROPion (WELLBUTRIN XL) 300 MG 24 hr tablet Take 1 tablet (300 mg total) by mouth daily. 30 tablet 2  . clonazePAM (KLONOPIN) 0.5 MG tablet TAKE ONE TABLET THREE TIMES A DAY AS NEEDED FOR ANXIETY 80 tablet 0  . meclizine (ANTIVERT) 25 MG tablet Take 1  tablet (25 mg total) by mouth 3 (three) times daily as needed for dizziness. 30 tablet 0  . ondansetron (ZOFRAN ODT) 4 MG disintegrating tablet Take 1 tablet (4 mg total) by mouth every 8 (eight) hours as needed for nausea or vomiting. 20 tablet 0  . traMADol (ULTRAM) 50 MG tablet TAKE TWO TABLETS BY MOUTH THREE TIMES A DAY 180 tablet 4   No current facility-administered medications on file prior to visit.     BP 120/71 (BP Location: Left Arm, Patient Position: Sitting, Cuff Size: Small)   Pulse 76   Temp 98.1 F (36.7 C) (Oral)   Resp 16   Ht 5\' 6"  (1.676 m)   Wt 128 lb (58.1 kg)   SpO2 100%   BMI 20.66 kg/m       Objective:   Physical Exam  Constitutional: She is oriented to person, place, and time. She appears well-developed and well-nourished.  HENT:  Right Ear: Tympanic membrane and ear canal normal.  Left Ear: Tympanic membrane and ear canal normal.  Eyes: EOM are normal. Pupils are equal, round, and reactive to light.  Cardiovascular:  No murmur heard. Musculoskeletal: She exhibits no edema.  Neurological: She is alert and oriented to person, place, and time. She exhibits normal muscle tone.  Speech is clear, positive facial symmetry is noted  Psychiatric: She has a normal mood and affect. Her behavior is normal. Judgment and thought content normal.          Assessment & Plan:  Concussion-we discussed that her symptoms are consistent with recent concussion.  She has no neurological changes on exam today.  I have advised her to continue the meclizine as needed as well as the ondansetron as needed.  I have advised her let me know if new or worsening symptoms or if symptoms are not improved in 2 weeks.  Could consider referral to sports medicine/concussion clinic if symptoms fail to improve.  Patient verbalizes understanding.

## 2017-06-02 ENCOUNTER — Other Ambulatory Visit: Payer: Self-pay | Admitting: Family

## 2017-06-02 DIAGNOSIS — S060X9D Concussion with loss of consciousness of unspecified duration, subsequent encounter: Secondary | ICD-10-CM

## 2017-06-02 DIAGNOSIS — G43909 Migraine, unspecified, not intractable, without status migrainosus: Secondary | ICD-10-CM

## 2017-06-02 NOTE — Telephone Encounter (Signed)
Copied from [place]. Topic: Quick Communication - See Telephone Encounter >> Jun 02, 2017  9:15 AM Synthia Innocent wrote: CRM for notification. See Telephone encounter for:  Reeves Forth for a concussion, still is experiencing problems, would like to know the name of the concussion clinic that she mentioned at office visit. Needing refill on clonazePAM (KLONOPIN) 0.5 MG tablet, Kristopher Oppenheim on [facility] 06/02/17.

## 2017-06-02 NOTE — Telephone Encounter (Signed)
Ref to sports medicine placed for Pt to be seen by concussion clinic. Pt called an notified. I asked Pt to call and let us know if she hasn't heard anything back within a week's time frame. Pt stated that she would and call was concluded.

## 2017-06-03 MED ORDER — CLONAZEPAM 0.5 MG PO TABS: ORAL_TABLET | ORAL | 0 refills | Status: DC

## 2017-06-03 NOTE — Telephone Encounter (Signed)
Left message on voice mail that refill has been sent to pharmacy.

## 2017-06-03 NOTE — Telephone Encounter (Signed)
Reviewed [facility] controlled substance registry.  Refill sent.  Order signed for concussion clinic.

## 2017-06-03 NOTE — Telephone Encounter (Signed)
Pt checking status on refill of Klonopin. Shannon [name] on [address] [facility].

## 2017-06-09 ENCOUNTER — Ambulatory Visit: Payer: Self-pay | Admitting: Family Medicine

## 2017-06-16 ENCOUNTER — Encounter: Payer: Self-pay | Admitting: Family Medicine

## 2017-06-16 ENCOUNTER — Telehealth: Payer: Self-pay | Admitting: Family

## 2017-06-16 ENCOUNTER — Ambulatory Visit: Payer: 59 | Admitting: Family Medicine

## 2017-06-16 VITALS — BP 126/86 | HR 75 | Ht 66.0 in | Wt 123.0 lb

## 2017-06-16 DIAGNOSIS — S060X0A Concussion without loss of consciousness, initial encounter: Secondary | ICD-10-CM

## 2017-06-16 DIAGNOSIS — F0781 Postconcussional syndrome: Secondary | ICD-10-CM | POA: Diagnosis not present

## 2017-06-16 MED ORDER — MECLIZINE HCL 25 MG PO TABS: 25.0000 mg | ORAL_TABLET | Freq: Three times a day (TID) | ORAL | 0 refills | Status: DC | PRN

## 2017-06-16 MED ORDER — PROPRANOLOL HCL 40 MG PO TABS: 40.0000 mg | ORAL_TABLET | Freq: Three times a day (TID) | ORAL | 1 refills | Status: DC

## 2017-06-16 NOTE — Telephone Encounter (Signed)
Pt has appt later this afternoon w/ Dr. Barbaraann Barthel for concussion sx's.

## 2017-06-16 NOTE — Telephone Encounter (Signed)
rx sent to [facility]. If numbness/weakness/facial droop needs to go to the ER.

## 2017-06-16 NOTE — Telephone Encounter (Signed)
Pt came in to ask about getting a vertigo medication refill. Pt stated that she had an appt with Dr.  Shirlean Schlein, but got the time wrong for the appt. Pt states that she is unable to walk unassisted without it.

## 2017-06-16 NOTE — Telephone Encounter (Signed)
Please advise 

## 2017-06-16 NOTE — Patient Instructions (Addendum)
You have postconcussion syndrome. Start vestibular/oculomotor therapy and physical therapy (for neck). Do home exercises on days you don't go to therapy. You can start the oculomotor (two pictures, looking back and forth, up and down) and vestibular exercises now (double leg, single leg, one foot in front of the other) as we discussed. Try propranolol three times a day - you can start with 1/2 tablet 3 times a day for a few days then increase to full tablet 3 times a day - call me if you have a problem with this. Make sure you get up slowly when you're starting this. Ok to take tylenol if needed for headache. Follow up with me in about 3 weeks (as early as 2 weeks if you've started at least a couple visits of therapy).

## 2017-06-18 ENCOUNTER — Encounter: Payer: Self-pay | Admitting: Family Medicine

## 2017-06-18 DIAGNOSIS — S060X0A Concussion without loss of consciousness, initial encounter: Secondary | ICD-10-CM | POA: Insufficient documentation

## 2017-06-18 HISTORY — DX: Concussion without loss of consciousness, initial encounter: S06.0X0A

## 2017-06-18 NOTE — Assessment & Plan Note (Signed)
symptoms present for more than 5 weeks, consistent with postconcussion syndrome.  Complicated by history of prior concussions, history of migraines, anxiety, and depression.  Latter conditions not affected by her concussion however.  Start vestibular and oculomotor therapy.  Start physical therapy for her cervical strain, chronic pain which is contributing to her symptoms.  Shown home exercises to start.  Try propranolol for her headaches at a low dose.  Tylenol if needed.  F/u in 3 weeks.  Total visit time 45 minutes - > 50% of which spent on counseling, answering questions, reviewing treatment options.

## 2017-06-18 NOTE — Progress Notes (Signed)
PCP and consultation requested by: [name], NP  Subjective:   HPI: Patient is a 57 y.o. female here for concussion.  Patient reports on 1/9 she fell backwards and struck the back of her head on the corner of a [place] cabinet. She had headache, nausea, dizziness, photophobia, difficulty concentrating and remembering, balance problems immediately following this. She has had 3 concussions including the current one. She's had a head CT in the ED following current injury that was negative for hemorrhage. She is right handed. She has a history of migraines, depression, anxiety. She takes klonopoin and wellbutrin xl as well as tramadol for chronic pain. Current symptoms are worse in the morning, improve as day goes on. Symptoms worse with walking and swimming. Current headache is more frontal - with her usual migraines they start from occiput and radiate to the front. SCAT performed - 16/22 symptoms with severity 59/132  Past Medical History:  Diagnosis Date  . Anemia   . Anxiety   . Depression   . Eating disorder    anorexia nervosa in her 20's  . Fainting spell   . Hx of laminectomy 1994   L4-5  . Kidney stones    passed on their own  . Migraine   . Stomach disorder    due to complications with cholecystectomy "almost died"    Current Outpatient Medications on File Prior to Visit  Medication Sig Dispense Refill  . buPROPion (WELLBUTRIN XL) 300 MG 24 hr tablet Take 1 tablet (300 mg total) by mouth daily. 30 tablet 2  . clonazePAM (KLONOPIN) 0.5 MG tablet TAKE ONE TABLET THREE TIMES A DAY AS NEEDED FOR ANXIETY 80 tablet 0  . ondansetron (ZOFRAN ODT) 4 MG disintegrating tablet Take 1 tablet (4 mg total) by mouth every 8 (eight) hours as needed for nausea or vomiting. 20 tablet 0  . traMADol (ULTRAM) 50 MG tablet TAKE TWO TABLETS BY MOUTH THREE TIMES A DAY 180 tablet 4   No current facility-administered medications on file prior to visit.     Past Surgical History:   Procedure Laterality Date  . APPENDECTOMY  1976  . BREAST BIOPSY Right   . CHOLECYSTECTOMY  08/2009   reports history of gallbladder polyps, she reports stents in duct of lushca   . HYSTEROSCOPY  07/2011  . LAMINECTOMY  1994   L4-5  . TONSILLECTOMY  1982  . TUBAL LIGATION      Allergies  Allergen Reactions  . Buspar [Buspirone]     constipation  . Eszopiclone     Headaches  . Flexeril [Cyclobenzaprine Hcl] Other (See Comments)    Mood swings  . Gabapentin     sedation  . Morphine And Related Other (See Comments)    No relief  . Nsaids Other (See Comments)    GI bleed  . Paroxetine Nausea Only       . Shellfish Allergy Other (See Comments)    skin & mouth tingle  . Tizanidine Hcl Other (See Comments)    Dizziness, mood swings    Social History   Socioeconomic History  . Marital status: Married    Spouse name: Not on file  . Number of children: 3  . Years of education: Not on file  . Highest education level: Not on file  Social Needs  . Financial resource strain: Not on file  . Food insecurity - worry: Not on file  . Food insecurity - inability: Not on file  . Transportation needs - medical: Not  on file  . Transportation needs - non-medical: Not on file  Occupational History    Employer: SELF EMPLOYED  Tobacco Use  . Smoking status: Never Smoker  . Smokeless tobacco: Never Used  Substance and Sexual Activity  . Alcohol use: No  . Drug use: Yes  . Sexual activity: Yes    Partners: Male    Birth control/protection: Surgical  Other Topics Concern  . Not on file  Social History Narrative   Regular exercise:  Yes (swim instructor)   Caffeine Use:  1 daily   Married 3 children ages 43 son, 16 son, and 18 daughter   Associates degree                Family History  Problem Relation Age of Onset  . Hypertension Mother   . Diabetes Father   . Hypertension Father   . Hypothyroidism Father   . CAD Father   . Alcohol abuse Maternal Grandmother   .  Cancer Paternal Grandfather        lung cancer  . Hypothyroidism Other   . Hashimoto's thyroiditis Sister   . Hypothyroidism Sister   . Neuropathy Sister        chronic inflammatory demyelinating peripheral neuropathy from lyme disease    BP 126/86   Pulse 75   Ht 5\' 6"  (1.676 m)   Wt 123 lb (55.8 kg)   BMI 19.85 kg/m   Review of Systems: See HPI above.     Objective:  Physical Exam:  Gen: NAD, comfortable in exam room  Neuro: Alert, oriented 5/5 CN 2-12 grossly intact. Strength 5/5 all extremities. Sensation intact to light touch all extremities. No dysdiadochokinesia. Finger to nose error on right - coordination 0/1 Immediate memory 13/15 Concentration 4/5 Neck FROM with tenderness bilateral cervical paraspinal region.  No radicular symptoms. Balance 0 errors double leg, unsteady with single leg and tandem stance. Delayed recall 1/5 Horizontal saccades 30 trials; only completed 10 trials vertical saccades before aborting due to symptoms and nystagmus.  Fixed gaze with head rotation deferred.   Assessment & Plan:  1. Concussion without loss of consciousness - symptoms present for more than 5 weeks, consistent with postconcussion syndrome.  Complicated by history of prior concussions, history of migraines, anxiety, and depression.  Latter conditions not affected by her concussion however.  Start vestibular and oculomotor therapy.  Start physical therapy for her cervical strain, chronic pain which is contributing to her symptoms.  Shown home exercises to start.  Try propranolol for her headaches at a low dose.  Tylenol if needed.  F/u in 3 weeks.  Total visit time 45 minutes - > 50% of which spent on counseling, answering questions, reviewing treatment options.

## 2017-06-21 DIAGNOSIS — H43313 Vitreous membranes and strands, bilateral: Secondary | ICD-10-CM | POA: Diagnosis not present

## 2017-06-21 DIAGNOSIS — H2513 Age-related nuclear cataract, bilateral: Secondary | ICD-10-CM | POA: Diagnosis not present

## 2017-06-21 DIAGNOSIS — H53143 Visual discomfort, bilateral: Secondary | ICD-10-CM | POA: Diagnosis not present

## 2017-06-23 ENCOUNTER — Telehealth: Payer: Self-pay | Admitting: Family Medicine

## 2017-06-23 NOTE — Telephone Encounter (Signed)
Patient calling regarding propranolol Rx. States she is not having any adverse reactions but the medicine is not helping much with her headaches.   Patient said she is starting therapy next week

## 2017-06-23 NOTE — Telephone Encounter (Signed)
Ok she's still on half of the max dose so it's not surprising if she's not noticing a benefit yet.  Please ask her to take 1.5 tablets (of her 40mg  tabs) three times a day for 1 week then if she still does well I'd ask her to take 2 tablets three times a day.  When she runs out I can send 80mg  tablets in.  Thanks!

## 2017-06-23 NOTE — Telephone Encounter (Signed)
Left message for patient to return call.

## 2017-06-24 NOTE — Telephone Encounter (Signed)
Patient was informed and stated understanding. Will start the 1.5 tablets three times a day for 1 week.

## 2017-06-28 ENCOUNTER — Other Ambulatory Visit: Payer: Self-pay

## 2017-06-28 ENCOUNTER — Ambulatory Visit: Payer: 59 | Attending: Family Medicine | Admitting: Physical Therapy

## 2017-06-28 ENCOUNTER — Encounter: Payer: Self-pay | Admitting: Physical Therapy

## 2017-06-28 DIAGNOSIS — M542 Cervicalgia: Secondary | ICD-10-CM

## 2017-06-28 DIAGNOSIS — R42 Dizziness and giddiness: Secondary | ICD-10-CM | POA: Diagnosis not present

## 2017-06-28 DIAGNOSIS — R293 Abnormal posture: Secondary | ICD-10-CM | POA: Diagnosis present

## 2017-06-28 NOTE — Patient Instructions (Signed)
Gaze Stabilization: Sitting   Keeping eyes on target arms length away, tilt head down 15-30 and move head side to side for __15-30__ seconds. Repeat while moving head up and down for __15-30__ seconds. Do __3__ sessions per day.  Flexibility: Upper Trapezius Stretch   Gently grasp right side of head while reaching behind back with other hand. Tilt head away until a gentle stretch is felt. Hold __30__ seconds. Repeat __3__ times per set.  Levator Scapula Stretch, Sitting   Sit, one hand tucked under hip on side to be stretched, other hand over top of head. Turn head toward other side and look down. Use hand on head to gently stretch neck in that position. Hold _30__ seconds. Repeat _3__ times per session.  Scapular Retraction (Standing)   With arms at sides, pinch shoulder blades together. Repeat __15__ times per set. Do __2__ sets per session.

## 2017-06-29 NOTE — Therapy (Signed)
[facility] [address]  [place], [place], [address] Phone: [phone]   Fax:  [phone]  Physical Therapy Evaluation  Patient Details  Name: Shannon [name] MRN: [id number] Date of Birth: 05-04-1960 Referring Provider: Dr. Karlton Lemon   Encounter Date: 06/28/2017  PT End of Session - 06/28/17 1805    Visit Number  1    Number of Visits  12    Date for PT Re-Evaluation  08/09/17    PT Start Time  1701    PT Stop Time  1746    PT Time Calculation (min)  45 min    Activity Tolerance  Patient tolerated treatment well    Behavior During Therapy  [facility] for tasks assessed/performed       Past Medical History:  Diagnosis Date  . Anemia   . Anxiety   . Depression   . Eating disorder    anorexia nervosa in her 20's  . Fainting spell   . Hx of laminectomy 1994   L4-5  . Kidney stones    passed on their own  . Migraine   . Stomach disorder    due to complications with cholecystectomy "almost died"    Past Surgical History:  Procedure Laterality Date  . APPENDECTOMY  1976  . BREAST BIOPSY Right   . CHOLECYSTECTOMY  08/2009   reports history of gallbladder polyps, she reports stents in duct of lushca   . HYSTEROSCOPY  07/2011  . LAMINECTOMY  1994   L4-5  . TONSILLECTOMY  1982  . TUBAL LIGATION      There were no vitals filed for this visit.   Subjective Assessment - 06/28/17 1702    Subjective  Saw Dr. Barbaraann Barthel 1 week ago - had a fall in January with a concussion - was stepping over baby gate and fell backwards and hit back L head. Reports "blacked out" but did not lose consc. Next day had some dizziness, headache, nausea. 2 days later had vertigo type symptoms - rolling to both sides. Went to [place] - CT scan negative. Did not drive for 2 weeks. 2 weeks ago, was getting out of bed and had another bout of instability. Now having most symptoms in the morning, some nausea as well as daily headache.      Pertinent History  back surgery 22 years ago, medial branch blocks L3-5, DDD    Diagnostic tests  CT scan - negative    Currently in Pain?  Yes    Pain Score  6     Pain Location  Neck    Pain Orientation  Right    Pain Descriptors / Indicators  Aching;Constant    Pain Type  Chronic pain         [facility] PT Assessment - 06/28/17 1757      Assessment   Medical Diagnosis  Post consussion syndrome    Referring Provider  Dr. Karlton Lemon    Onset Date/Surgical Date  -- January 2019    Prior Therapy  no      Precautions   Precautions  None      Restrictions   Weight Bearing Restrictions  No      Balance Screen   Has the patient fallen in the past 6 months  Yes    How many times?  1    Has the patient had a decrease in activity level because of a fear of falling?   No  Is the patient reluctant to leave their home because of a fear of falling?   No      Prior Function   Level of Independence  Independent    Leisure  teaches swimming lessons      Cognition   Overall Cognitive Status  Within Functional Limits for tasks assessed         Vestibular Assessment - 06/28/17 1758      Vestibular Assessment   General Observation  well appearing 57 y/o - no signs of distress      Symptom Behavior   Frequency of Dizziness  -- mornings    Duration of Dizziness  -- inconsistent    Aggravating Factors  Supine to sit;Rolling to right;Rolling to left    Relieving Factors  Head stationary;Lying supine;Dark room;Closing eyes;Avoiding busy/distracting areas      Occulomotor Exam   Occulomotor Alignment  Normal    Spontaneous  Absent    Smooth Pursuits  Intact    Saccades  Intact headache with up/down      Positional Testing   Dix-Hallpike  Dix-Hallpike Right;Dix-Hallpike Left      Dix-Hallpike Right   Dix-Hallpike Right Symptoms  No nystagmus      Dix-Hallpike Left   Dix-Hallpike Left Symptoms  No nystagmus      Cognition   Cognition Orientation Level  Oriented x 4          Objective measurements completed on examination: See above findings.      [facility] Adult PT Treatment/Exercise - 06/28/17 0001      Exercises   Exercises  Neck      Neck Exercises: Seated   Other Seated Exercise  scap retraction x 5 reps      Neck Exercises: Stretches   Upper Trapezius Stretch  Right;2 reps;30 seconds    Levator Stretch  Right;2 reps;30 seconds      Vestibular Treatment/Exercise - 06/28/17 0001      Vestibular Treatment/Exercise   Gaze Exercises  X1 Viewing Horizontal;X1 Viewing Vertical      X1 Viewing Horizontal   Comments  seated x 15 sec      X1 Viewing Vertical   Comments  seated x 15 sec            PT Education - 06/28/17 1804    Education provided  Yes    Education Details  exam findings, POC, HEP    Person(s) Educated  Patient    Methods  Explanation;Demonstration;Handout    Comprehension  Verbalized understanding;Returned demonstration          PT Long Term Goals - 06/29/17 0750      PT LONG TERM GOAL #1   Title  patient to be independent with advanced HEP    Status  New    Target Date  08/10/17      PT LONG TERM GOAL #2   Title  patient to maintain B tandem stance and B SLS for >/= 30 seconds without LOB    Status  New    Target Date  08/10/17      PT LONG TERM GOAL #3   Title  patient to report reduction in headache/dizziness frequency, duration, intensity by >/= 50%    Status  New    Target Date  08/10/17      PT LONG TERM GOAL #4   Title  patient to report ability to return to swimming without limitations    Status  New    Target Date  08/10/17             Plan - 06/28/17 1806    Clinical Impression Statement  Shannon [name] is a pleasant 57 y/o female presenting to [place] today s/p concussion in January due to fall. Patient with primary complaints of daily headache, symptom provocation getting out of bed, intermittent dizziness, reduced balance, and neck pain. Patient today with normal eye movements with  smooth pursuits and saccades, however increased headache with vertical saccades. Negative Dix-Hallpike testing, but with noted constriction/dilation of pupils with some "off-balance" feelings. Patient with a tendency to have reduced cervial rotation to the R due to pain - likely some component of cervicogenic dizziness exacerbating post-concussion symptoms. Patient given gentle stretching for C-spine and gaze stabilization activities today with limited tolerance due to nausea, headache, and neck pain. Will benefit from skilled PT to address the above listed deficits/complaints to allow for improved QOL and return to swimming/daily activities wihtout limitations.     Clinical Presentation  Evolving    Clinical Decision Making  Moderate    Rehab Potential  Good    PT Frequency  2x / week    PT Duration  6 weeks    PT Treatment/Interventions  ADLs/Self Care Home Management;Canalith Repostioning;Cryotherapy;Electrical Stimulation;Moist Heat;Traction;Therapeutic exercise;Therapeutic activities;Balance training;Neuromuscular re-education;Patient/family education;Manual techniques;Taping;Dry needling;Passive range of motion;Visual/perceptual remediation/compensation    Consulted and Agree with Plan of Care  Patient       Patient will benefit from skilled therapeutic intervention in order to improve the following deficits and impairments:  Pain, Decreased balance, Dizziness, Impaired vision/preception  Visit Diagnosis: Dizziness and giddiness - Plan: PT plan of care cert/re-cert  Cervicalgia - Plan: PT plan of care cert/re-cert  Abnormal posture - Plan: PT plan of care cert/re-cert     Problem List Patient Active Problem List   Diagnosis Date Noted  . Concussion without loss of consciousness 06/18/2017  . Cervical spondylosis without myelopathy 02/12/2016  . Spondylosis of lumbar region without myelopathy or radiculopathy 07/15/2015  . Chronic midline posterior neck pain 06/19/2015  .  Sacroiliac dysfunction 06/19/2015  . Lumbar radicular pain 06/19/2015  . Postlaminectomy syndrome, lumbar region 06/19/2015  . Rhus dermatitis 10/25/2014  . Raynaud's disease 03/23/2013  . Memory loss 02/19/2013  . Major depression 12/23/2012  . Unspecified constipation 08/23/2012  . Migraine 02/23/2012  . Routine general medical examination at a health care facility 02/15/2012  . Chronic pain 02/17/2011  . ASCUS on Pap smear 02/16/2011  . Anxiety and depression 12/30/2010     Lanney Gins, PT, DPT 06/29/17 7:53 AM   [facility] [address]  [place], [place], [address] Phone: [phone]   Fax:  [phone]  Name: Avryl Roehm MRN: [id number] Date of Birth: April 30, 1961

## 2017-06-30 ENCOUNTER — Ambulatory Visit: Payer: 59 | Admitting: Physical Therapy

## 2017-07-04 ENCOUNTER — Telehealth: Payer: Self-pay

## 2017-07-04 ENCOUNTER — Encounter: Payer: Self-pay | Admitting: Physical Therapy

## 2017-07-04 ENCOUNTER — Ambulatory Visit: Payer: 59 | Attending: Family Medicine | Admitting: Physical Therapy

## 2017-07-04 DIAGNOSIS — R42 Dizziness and giddiness: Secondary | ICD-10-CM

## 2017-07-04 DIAGNOSIS — M542 Cervicalgia: Secondary | ICD-10-CM | POA: Diagnosis present

## 2017-07-04 DIAGNOSIS — R293 Abnormal posture: Secondary | ICD-10-CM | POA: Diagnosis present

## 2017-07-04 MED ORDER — CLONAZEPAM 0.5 MG PO TABS: ORAL_TABLET | ORAL | 0 refills | Status: DC

## 2017-07-04 NOTE — Telephone Encounter (Signed)
Last clonazepam RX: 06/03/17, #80 Last OV: 05/18/17 Next OV: 08/02/17 UDS: 03/10/17 CSC: 03/10/17 CSR: No discrepancies identified

## 2017-07-04 NOTE — Therapy (Signed)
[facility] [address]  [place], [place], [address] Phone: [phone]   Fax:  [phone]  Physical Therapy Treatment  Patient Details  Name: Shannon [name] MRN: [id number] Date of Birth: 1960-08-25 Referring Provider: Dr. Karlton Lemon   Encounter Date: 07/04/2017  PT End of Session - 07/04/17 0802    Visit Number  2    Number of Visits  12    Date for PT Re-Evaluation  08/09/17    PT Start Time  0802    PT Stop Time  0848    PT Time Calculation (min)  46 min    Activity Tolerance  Patient tolerated treatment well    Behavior During Therapy  [facility] for tasks assessed/performed       Past Medical History:  Diagnosis Date  . Anemia   . Anxiety   . Depression   . Eating disorder    anorexia nervosa in her 20's  . Fainting spell   . Hx of laminectomy 1994   L4-5  . Kidney stones    passed on their own  . Migraine   . Stomach disorder    due to complications with cholecystectomy "almost died"    Past Surgical History:  Procedure Laterality Date  . APPENDECTOMY  1976  . BREAST BIOPSY Right   . CHOLECYSTECTOMY  08/2009   reports history of gallbladder polyps, she reports stents in duct of lushca   . HYSTEROSCOPY  07/2011  . LAMINECTOMY  1994   L4-5  . TONSILLECTOMY  1982  . TUBAL LIGATION      There were no vitals filed for this visit.  Subjective Assessment - 07/04/17 0806    Subjective  Pt reporting improvement with side to side visual exercises, but still feels limited with verical motions.    Pertinent History  back surgery 22 years ago, medial branch blocks L3-5, DDD    Diagnostic tests  CT scan - negative    Currently in Pain?  Yes    Pain Score  6     Pain Location  Neck    Pain Orientation  Right    Pain Descriptors / Indicators  Constant;Aching;Stabbing    Pain Type  Chronic pain    Multiple Pain Sites  Yes    Pain Score  4    Pain Location  Head    Pain Orientation  Posterior     Pain Descriptors / Indicators  Aching "early migraine"    Pain Type  Chronic pain                      [facility] Adult PT Treatment/Exercise - 07/04/17 0802      Manual Therapy   Manual Therapy  Joint mobilization;Soft tissue mobilization;Myofascial release;Passive ROM;Manual Traction    Manual therapy comments  prone & hooklying    Joint Mobilization  CPAs to C3-6 - pt reporting increased tenderness with this    Soft tissue mobilization  B cervical paraspinals, UT & LS (R>L)    Myofascial Release  TPR to R UT & LS    Passive ROM  manual stretch/PROM for B UT & LS and into neck flexion    Manual Traction  gentle cervical distraction 5x30" - pt reporting good relief with this       Trigger Point Dry Needling - 07/04/17 0802    Consent Given?  Yes    Education Handout Provided  Yes    Muscles  Treated Upper Body  Upper trapezius;Levator scapulae Cervical multifidi    Upper Trapezius Response  Twitch reponse elicited;Palpable increased muscle length    Levator Scapulae Response  Twitch response elicited;Palpable increased muscle length                PT Long Term Goals - 06/29/17 0750      PT LONG TERM GOAL #1   Title  patient to be independent with advanced HEP    Status  New    Target Date  08/10/17      PT LONG TERM GOAL #2   Title  patient to maintain B tandem stance and B SLS for >/= 30 seconds without LOB    Status  New    Target Date  08/10/17      PT LONG TERM GOAL #3   Title  patient to report reduction in headache/dizziness frequency, duration, intensity by >/= 50%    Status  New    Target Date  08/10/17      PT LONG TERM GOAL #4   Title  patient to report ability to return to swimming without limitations    Status  New    Target Date  08/10/17            Plan - 07/04/17 0845    Clinical Impression Statement  Pt reporting continued moderate neck pain and early signs of migraine headache today, therefore focused on manual therapy  including DN to cervical musculature after education on DN provided and verbal consent for treatment received. Positive twitch response elicited with DN along with decreased muscle tension and decrease in active trigger points noted following DN and manual therapy. Only discomfort noted with attempts at CPAs, but overall pain reduced by end of session.    Rehab Potential  Good    PT Treatment/Interventions  ADLs/Self Care Home Management;Canalith Repostioning;Cryotherapy;Electrical Stimulation;Moist Heat;Traction;Therapeutic exercise;Therapeutic activities;Balance training;Neuromuscular re-education;Patient/family education;Manual techniques;Taping;Dry needling;Passive range of motion;Visual/perceptual remediation/compensation    Consulted and Agree with Plan of Care  Patient       Patient will benefit from skilled therapeutic intervention in order to improve the following deficits and impairments:  Pain, Decreased balance, Dizziness, Impaired vision/preception  Visit Diagnosis: Dizziness and giddiness  Cervicalgia  Abnormal posture     Problem List Patient Active Problem List   Diagnosis Date Noted  . Concussion without loss of consciousness 06/18/2017  . Cervical spondylosis without myelopathy 02/12/2016  . Spondylosis of lumbar region without myelopathy or radiculopathy 07/15/2015  . Chronic midline posterior neck pain 06/19/2015  . Sacroiliac dysfunction 06/19/2015  . Lumbar radicular pain 06/19/2015  . Postlaminectomy syndrome, lumbar region 06/19/2015  . Rhus dermatitis 10/25/2014  . Raynaud's disease 03/23/2013  . Memory loss 02/19/2013  . Major depression 12/23/2012  . Unspecified constipation 08/23/2012  . Migraine 02/23/2012  . Routine general medical examination at a health care facility 02/15/2012  . Chronic pain 02/17/2011  . ASCUS on Pap smear 02/16/2011  . Anxiety and depression 12/30/2010    Percival Spanish, PT, MPT 07/04/2017, 12:19 PM  [facility] [address]  [place], [place], [address] Phone: [phone]   Fax:  [phone]  Name: Shannon [name] MRN: [id number] Date of Birth: 06-11-1960

## 2017-07-04 NOTE — Patient Instructions (Signed)

## 2017-07-04 NOTE — Telephone Encounter (Signed)
Copied from [place] [id number]. Topic: Quick Communication - See Telephone Encounter >> Jul 04, 2017 11:30 AM Oneta Rack wrote: Relation to pt: self Call back number: [phone] Pharmacy: [facility], [address] [phone] (Phone) [phone] (Fax)    Reason for call:  Patient requesting clonazePAM (KLONOPIN) 0.5 MG tablet, patient denied contacting pharmacy stating in the past the communication between pharmacy and PCP office has not been effective. Informed patient please allow 48 to 72 hour turn around time, please advise >> Jul 04, 2017 11:37 AM Oneta Rack wrote: Relation to pt: self Call back number: [phone] Pharmacy: [facility], [address] [phone] (Phone) [phone] (Fax)    Reason for call:  Patient requesting clonazePAM (KLONOPIN) 0.5 MG tablet, patient denied contacting pharmacy stating in the past the communication between pharmacy and PCP office has not been effective. Informed patient please allow 48 to 72 hour turn around time, please advise

## 2017-07-07 ENCOUNTER — Ambulatory Visit: Payer: 59 | Admitting: Family Medicine

## 2017-07-07 ENCOUNTER — Ambulatory Visit: Payer: 59 | Admitting: Physical Therapy

## 2017-07-07 ENCOUNTER — Encounter: Payer: Self-pay | Admitting: Family Medicine

## 2017-07-07 ENCOUNTER — Encounter: Payer: Self-pay | Admitting: Physical Therapy

## 2017-07-07 DIAGNOSIS — S060X0D Concussion without loss of consciousness, subsequent encounter: Secondary | ICD-10-CM | POA: Diagnosis not present

## 2017-07-07 DIAGNOSIS — M542 Cervicalgia: Secondary | ICD-10-CM

## 2017-07-07 DIAGNOSIS — R293 Abnormal posture: Secondary | ICD-10-CM

## 2017-07-07 DIAGNOSIS — R42 Dizziness and giddiness: Secondary | ICD-10-CM

## 2017-07-07 MED ORDER — ONDANSETRON 4 MG PO TBDP: 4.0000 mg | ORAL_TABLET | Freq: Three times a day (TID) | ORAL | 1 refills | Status: DC | PRN

## 2017-07-07 MED ORDER — PROPRANOLOL HCL 80 MG PO TABS: 80.0000 mg | ORAL_TABLET | Freq: Three times a day (TID) | ORAL | 1 refills | Status: DC

## 2017-07-07 NOTE — Patient Instructions (Signed)
You have postconcussion syndrome. Continue vestibular/oculomotor therapy and physical therapy (for neck). Do home exercises on days you don't go to therapy. Take propranolol three times a day - I've sent in the higher dose of this. Zofran as needed for nausea. Ok to take tylenol if needed for headache but take rarely. Follow up with me in 4 weeks.

## 2017-07-07 NOTE — Therapy (Signed)
[facility] [address]  [place], [place], [address] Phone: [phone]   Fax:  [phone]  Physical Therapy Treatment  Patient Details  Name: Shannon [name] MRN: [id number] Date of Birth: 1960-12-01 Referring Provider: Dr. Karlton Lemon   Encounter Date: 07/07/2017  PT End of Session - 07/07/17 0949    Visit Number  3    Number of Visits  12    Date for PT Re-Evaluation  08/09/17    PT Start Time  0949    PT Stop Time  1039    PT Time Calculation (min)  50 min    Activity Tolerance  Patient tolerated treatment well    Behavior During Therapy  [facility] for tasks assessed/performed       Past Medical History:  Diagnosis Date  . Anemia   . Anxiety   . Depression   . Eating disorder    anorexia nervosa in her 20's  . Fainting spell   . Hx of laminectomy 1994   L4-5  . Kidney stones    passed on their own  . Migraine   . Stomach disorder    due to complications with cholecystectomy "almost died"    Past Surgical History:  Procedure Laterality Date  . APPENDECTOMY  1976  . BREAST BIOPSY Right   . CHOLECYSTECTOMY  08/2009   reports history of gallbladder polyps, she reports stents in duct of lushca   . HYSTEROSCOPY  07/2011  . LAMINECTOMY  1994   L4-5  . TONSILLECTOMY  1982  . TUBAL LIGATION      There were no vitals filed for this visit.  Subjective Assessment - 07/07/17 0954    Subjective  Pt reporting significant improvement in neck pain and headache after DN last session. Saw Dr. Barbaraann Barthel this morning who feels like DN may be more healpful than meds in dealing with headaches, but he did renew her prescription. Pt reporting continued improvement with horziontal VOR-1, but still feels limited with vertical motions.    Pertinent History  back surgery 22 years ago, medial branch blocks L3-5, DDD    Diagnostic tests  CT scan - negative    Currently in Pain?  Yes    Pain Score  -- 4-5/10    Pain  Location  Neck & head    Pain Orientation  Right    Pain Descriptors / Indicators  Aching;Stabbing    Pain Type  Chronic pain                      [facility] Adult PT Treatment/Exercise - 07/07/17 0949      Exercises   Exercises  Neck      Neck Exercises: Machines for Strengthening   UBE (Upper Arm Bike)  L 2.0 fwd/back x 3 min each      Manual Therapy   Manual Therapy  Soft tissue mobilization;Myofascial release    Manual therapy comments  prone & hooklying    Soft tissue mobilization  B cervical paraspinals, UT & LS (R>L)    Myofascial Release  B suboccipital release    Passive ROM  manual stretch for B UT & LS; PROM into neck flexion & B rotation - pain noted with end range R rotation    Manual Traction  gentle cervical distraction 5x30" - pt still reporting good relief with this      Vestibular Treatment/Exercise - 07/07/17 1049      Vestibular Treatment/Exercise  Gaze Exercises  X2 Viewing Horizontal;Eye/Head Exercise Horizontal      X2 Viewing Horizontal   Foot Position  seated     Comments  x 30 sec      Eye/Head Exercise Horizontal   Comments  B 90 dg head turn followed by body turn x 5      Trigger Point Dry Needling - 07/07/17 1044    Consent Given?  Yes    Muscles Treated Upper Body  Upper trapezius;Suboccipitals muscle group;Levator scapulae Cervical multifidi, splenius capitis & cervicus    Upper Trapezius Response  Twitch reponse elicited;Palpable increased muscle length    SubOccipitals Response  Twitch response elicited;Palpable increased muscle length    Levator Scapulae Response  Twitch response elicited;Palpable increased muscle length           PT Education - 07/07/17 1039    Education provided  Yes    Education Details  HEP update - horizontal VORx2, 90 dg head-body turns    Person(s) Educated  Patient    Methods  Explanation;Demonstration;Handout    Comprehension  Verbalized understanding;Returned demonstration;Need further  instruction          PT Long Term Goals - 07/07/17 3428      PT LONG TERM GOAL #1   Title  patient to be independent with advanced HEP    Status  On-going      PT LONG TERM GOAL #2   Title  patient to maintain B tandem stance and B SLS for >/= 30 seconds without LOB    Status  On-going      PT LONG TERM GOAL #3   Title  patient to report reduction in headache/dizziness frequency, duration, intensity by >/= 50%    Status  On-going      PT LONG TERM GOAL #4   Title  patient to report ability to return to swimming without limitations    Status  On-going            Plan - 07/07/17 0958    Clinical Impression Statement  Camara reporting improvement in neck pain and headache following DN last session, noting more soreness from DN than normal pain she has been experiencing. Further DN and manual therapy performed today with pt reporting headache resolved by end of session. Progressed horizontal gaze exercises to include VORx2 and 90 dg head-body turns. Pt noting slow/delayed trajectory with 90 dg head turn, feeling like she needs a second for her vision to catch up with her head but no dizziness or increased nausea.    Rehab Potential  Good    PT Treatment/Interventions  ADLs/Self Care Home Management;Canalith Repostioning;Cryotherapy;Electrical Stimulation;Moist Heat;Traction;Therapeutic exercise;Therapeutic activities;Balance training;Neuromuscular re-education;Patient/family education;Manual techniques;Taping;Dry needling;Passive range of motion;Visual/perceptual remediation/compensation    Consulted and Agree with Plan of Care  Patient       Patient will benefit from skilled therapeutic intervention in order to improve the following deficits and impairments:  Pain, Decreased balance, Dizziness, Impaired vision/preception  Visit Diagnosis: Dizziness and giddiness  Cervicalgia  Abnormal posture     Problem List Patient Active Problem List   Diagnosis Date Noted  .  Concussion without loss of consciousness 06/18/2017  . Cervical spondylosis without myelopathy 02/12/2016  . Spondylosis of lumbar region without myelopathy or radiculopathy 07/15/2015  . Chronic midline posterior neck pain 06/19/2015  . Sacroiliac dysfunction 06/19/2015  . Lumbar radicular pain 06/19/2015  . Postlaminectomy syndrome, lumbar region 06/19/2015  . Rhus dermatitis 10/25/2014  . Raynaud's disease 03/23/2013  . Memory loss  02/19/2013  . Major depression 12/23/2012  . Unspecified constipation 08/23/2012  . Migraine 02/23/2012  . Routine general medical examination at a health care facility 02/15/2012  . Chronic pain 02/17/2011  . ASCUS on Pap smear 02/16/2011  . Anxiety and depression 12/30/2010    Percival Spanish, PT, MPT 07/07/2017, 11:28 AM  [facility] [address]  [place], [place], [address] Phone: [phone]   Fax:  [phone]  Name: Shawnese Magner MRN: [id number] Date of Birth: June 05, 1960

## 2017-07-08 ENCOUNTER — Encounter: Payer: Self-pay | Admitting: Family Medicine

## 2017-07-08 NOTE — Assessment & Plan Note (Signed)
complicated by history of prior concussions, history of migraines, anxiety, and depression.  She is only had 2 visits of physical therapy with oculomotor and vestibular therapy to date.  She will continue with these.  She also continues seeing her psychologist as she was prior to the concussion.  Continue propranolol 3 times a day.  Total visit time 30 minutes greater than 50% of which was spent in counseling and answering questions.

## 2017-07-08 NOTE — Progress Notes (Signed)
PCP and consultation requested by: [name], NP  Subjective:   HPI: Patient is a 57 y.o. female here for concussion.  2/14: Patient reports on 1/9 she fell backwards and struck the back of her head on the corner of a [place] cabinet. She had headache, nausea, dizziness, photophobia, difficulty concentrating and remembering, balance problems immediately following this. She has had 3 concussions including the current one. She's had a head CT in the ED following current injury that was negative for hemorrhage. She is right handed. She has a history of migraines, depression, anxiety. She takes klonopoin and wellbutrin xl as well as tramadol for chronic pain. Current symptoms are worse in the morning, improve as day goes on. Symptoms worse with walking and swimming. Current headache is more frontal - with her usual migraines they start from occiput and radiate to the front. SCAT performed - 16/22 symptoms with severity 59/132  3/7: Patient returns reporting feeling about the same as last visit. She is only done 2 visits of physical therapy with the vestibular oculomotor therapy to date. She is taking propranolol and up to 2 tablets 3 times a day without any issues. She reports the dry needling and physical therapy helped her a little with her headaches. She gets some pain that goes into her right arm. Scat repeated- 19/22 symptoms with severity 79/132.  Past Medical History:  Diagnosis Date  . Anemia   . Anxiety   . Depression   . Eating disorder    anorexia nervosa in her 20's  . Fainting spell   . Hx of laminectomy 1994   L4-5  . Kidney stones    passed on their own  . Migraine   . Stomach disorder    due to complications with cholecystectomy "almost died"    Current Outpatient Medications on File Prior to Visit  Medication Sig Dispense Refill  . buPROPion (WELLBUTRIN XL) 300 MG 24 hr tablet Take 1 tablet (300 mg total) by mouth daily. 30 tablet 2  . clonazePAM  (KLONOPIN) 0.5 MG tablet TAKE ONE TABLET THREE TIMES A DAY AS NEEDED FOR ANXIETY 80 tablet 0  . meclizine (ANTIVERT) 25 MG tablet Take 1 tablet (25 mg total) by mouth 3 (three) times daily as needed for dizziness. 30 tablet 0  . traMADol (ULTRAM) 50 MG tablet TAKE TWO TABLETS BY MOUTH THREE TIMES A DAY 180 tablet 4   No current facility-administered medications on file prior to visit.     Past Surgical History:  Procedure Laterality Date  . APPENDECTOMY  1976  . BREAST BIOPSY Right   . CHOLECYSTECTOMY  08/2009   reports history of gallbladder polyps, she reports stents in duct of lushca   . HYSTEROSCOPY  07/2011  . LAMINECTOMY  1994   L4-5  . TONSILLECTOMY  1982  . TUBAL LIGATION      Allergies  Allergen Reactions  . Buspar [Buspirone]     constipation  . Eszopiclone     Headaches  . Flexeril [Cyclobenzaprine Hcl] Other (See Comments)    Mood swings  . Gabapentin     sedation  . Morphine And Related Other (See Comments)    No relief  . Nsaids Other (See Comments)    GI bleed  . Paroxetine Nausea Only       . Shellfish Allergy Other (See Comments)    skin & mouth tingle  . Tizanidine Hcl Other (See Comments)    Dizziness, mood swings    Social History  Socioeconomic History  . Marital status: Married    Spouse name: Not on file  . Number of children: 3  . Years of education: Not on file  . Highest education level: Not on file  Social Needs  . Financial resource strain: Not on file  . Food insecurity - worry: Not on file  . Food insecurity - inability: Not on file  . Transportation needs - medical: Not on file  . Transportation needs - non-medical: Not on file  Occupational History    Employer: SELF EMPLOYED  Tobacco Use  . Smoking status: Never Smoker  . Smokeless tobacco: Never Used  Substance and Sexual Activity  . Alcohol use: No  . Drug use: Yes  . Sexual activity: Yes    Partners: Male    Birth control/protection: Surgical  Other Topics Concern   . Not on file  Social History Narrative   Regular exercise:  Yes (swim instructor)   Caffeine Use:  1 daily   Married 3 children ages 25 son, 23 son, and 37 daughter   Associates degree                Family History  Problem Relation Age of Onset  . Hypertension Mother   . Diabetes Father   . Hypertension Father   . Hypothyroidism Father   . CAD Father   . Alcohol abuse Maternal Grandmother   . Cancer Paternal Grandfather        lung cancer  . Hypothyroidism Other   . Hashimoto's thyroiditis Sister   . Hypothyroidism Sister   . Neuropathy Sister        chronic inflammatory demyelinating peripheral neuropathy from lyme disease    BP 114/68   Ht 5\' 6"  (1.676 m)   Wt 123 lb (55.8 kg)   BMI 19.85 kg/m   Review of Systems: See HPI above.     Objective:  Physical Exam:  Gen: NAD, comfortable in exam room.  Neuro: Range of motion except for extension 5 degrees.  Bilateral paraspinal cervical tenderness right greater than left. Balance 0 errors with double leg stance.  Very unsteady with single leg and tandem stance.  Assessment & Plan:  1. Postconcussion syndrome -complicated by history of prior concussions, history of migraines, anxiety, and depression.  She is only had 2 visits of physical therapy with oculomotor and vestibular therapy to date.  She will continue with these.  She also continues seeing her psychologist as she was prior to the concussion.  Continue propranolol 3 times a day.  Total visit time 30 minutes greater than 50% of which was spent in counseling and answering questions.

## 2017-07-11 ENCOUNTER — Ambulatory Visit: Payer: 59 | Admitting: Physical Therapy

## 2017-07-11 ENCOUNTER — Encounter: Payer: Self-pay | Admitting: Physical Therapy

## 2017-07-11 DIAGNOSIS — R42 Dizziness and giddiness: Secondary | ICD-10-CM | POA: Diagnosis not present

## 2017-07-11 DIAGNOSIS — R293 Abnormal posture: Secondary | ICD-10-CM

## 2017-07-11 DIAGNOSIS — M542 Cervicalgia: Secondary | ICD-10-CM

## 2017-07-11 NOTE — Therapy (Signed)
[facility] [address]  [place], [place], [address] Phone: [phone]   Fax:  [phone]  Physical Therapy Treatment  Patient Details  Name: Shannon [name] MRN: [id number] Date of Birth: 01/12/1961 Referring Provider: Dr. Karlton Lemon   Encounter Date: 07/11/2017  PT End of Session - 07/11/17 0800    Visit Number  4    Number of Visits  12    Date for PT Re-Evaluation  08/09/17    PT Start Time  0756    PT Stop Time  0844    PT Time Calculation (min)  48 min    Activity Tolerance  Patient tolerated treatment well    Behavior During Therapy  [facility] for tasks assessed/performed       Past Medical History:  Diagnosis Date  . Anemia   . Anxiety   . Depression   . Eating disorder    anorexia nervosa in her 20's  . Fainting spell   . Hx of laminectomy 1994   L4-5  . Kidney stones    passed on their own  . Migraine   . Stomach disorder    due to complications with cholecystectomy "almost died"    Past Surgical History:  Procedure Laterality Date  . APPENDECTOMY  1976  . BREAST BIOPSY Right   . CHOLECYSTECTOMY  08/2009   reports history of gallbladder polyps, she reports stents in duct of lushca   . HYSTEROSCOPY  07/2011  . LAMINECTOMY  1994   L4-5  . TONSILLECTOMY  1982  . TUBAL LIGATION      There were no vitals filed for this visit.  Subjective Assessment - 07/11/17 0758    Subjective  feeling better overall: headaches, looking up and down, memory and balance.    Pertinent History  back surgery 22 years ago, medial branch blocks L3-5, DDD    Diagnostic tests  CT scan - negative    Patient Stated Goals  improve balance, memory, headaches, dizziness    Currently in Pain?  Yes    Pain Score  6     Pain Location  Shoulder    Pain Orientation  Right    Pain Descriptors / Indicators  Tightness    Pain Type  Chronic pain                      [facility] Adult PT Treatment/Exercise -  07/11/17 0001      Neuro Re-ed    Neuro Re-ed Details   B SL stance x 10 sec each leg; B tandem on firm and foam x 10 sec each - very minimal sway once in position      Neck Exercises: Machines for Strengthening   UBE (Upper Arm Bike)  L 2.0 fwd/back x 3 min each      Manual Therapy   Manual Therapy  Soft tissue mobilization;Myofascial release;Passive ROM    Manual therapy comments  prone and hooklying    Soft tissue mobilization  B UT, B LS, B infra, B cervical paraspinals    Myofascial Release  B suboccipital release    Passive ROM  passive stretch for B UT - no pain at end range      Neck Exercises: Stretches   Upper Trapezius Stretch  Right;Left;1 rep;60 seconds manual by PT    Other Neck Stretches  pec stretch over foam roller x 1-2 min       Trigger Point Dry Needling -  07/11/17 0858    Consent Given?  Yes    Muscles Treated Upper Body  Upper trapezius;Infraspinatus    Upper Trapezius Response  Twitch reponse elicited;Palpable increased muscle length bilateral    Infraspinatus Response  Twitch response elicited;Palpable increased muscle length R side only                PT Long Term Goals - 07/07/17 0958      PT LONG TERM GOAL #1   Title  patient to be independent with advanced HEP    Status  On-going      PT LONG TERM GOAL #2   Title  patient to maintain B tandem stance and B SLS for >/= 30 seconds without LOB    Status  On-going      PT LONG TERM GOAL #3   Title  patient to report reduction in headache/dizziness frequency, duration, intensity by >/= 50%    Status  On-going      PT LONG TERM GOAL #4   Title  patient to report ability to return to swimming without limitations    Status  On-going            Plan - 07/11/17 0800    Clinical Impression Statement  Jeanette reporting good improvement in all symptoms since starting PT - looking up/down, headaches, dizziness, memory and balance. Continued DN to B UT area as this conitnues to be her  greatest complaint of pain. Today able to maintain SL and tandem stance on B LE for 10 seconds with minimal sway - of which, she reports is near to her baseline level of functioning prior to concussion. Will continue to progress vestibular treatment as tolerated.     Rehab Potential  Good    PT Treatment/Interventions  ADLs/Self Care Home Management;Canalith Repostioning;Cryotherapy;Electrical Stimulation;Moist Heat;Traction;Therapeutic exercise;Therapeutic activities;Balance training;Neuromuscular re-education;Patient/family education;Manual techniques;Taping;Dry needling;Passive range of motion;Visual/perceptual remediation/compensation    Consulted and Agree with Plan of Care  Patient       Patient will benefit from skilled therapeutic intervention in order to improve the following deficits and impairments:  Pain, Decreased balance, Dizziness, Impaired vision/preception  Visit Diagnosis: Dizziness and giddiness  Cervicalgia  Abnormal posture     Problem List Patient Active Problem List   Diagnosis Date Noted  . Concussion without loss of consciousness 06/18/2017  . Cervical spondylosis without myelopathy 02/12/2016  . Spondylosis of lumbar region without myelopathy or radiculopathy 07/15/2015  . Chronic midline posterior neck pain 06/19/2015  . Sacroiliac dysfunction 06/19/2015  . Lumbar radicular pain 06/19/2015  . Postlaminectomy syndrome, lumbar region 06/19/2015  . Rhus dermatitis 10/25/2014  . Raynaud's disease 03/23/2013  . Memory loss 02/19/2013  . Major depression 12/23/2012  . Unspecified constipation 08/23/2012  . Migraine 02/23/2012  . Routine general medical examination at a health care facility 02/15/2012  . Chronic pain 02/17/2011  . ASCUS on Pap smear 02/16/2011  . Anxiety and depression 12/30/2010    Lanney Gins, PT, DPT 07/11/17 9:04 AM   [facility] [address]  [place],  [place], [address] Phone: [phone]   Fax:  [phone]  Name: Shannon [name] MRN: [id number] Date of Birth: 1960/07/26

## 2017-07-13 ENCOUNTER — Ambulatory Visit: Payer: 59 | Admitting: Physical Therapy

## 2017-07-14 ENCOUNTER — Ambulatory Visit: Payer: 59 | Admitting: Physical Therapy

## 2017-07-19 ENCOUNTER — Encounter: Payer: Self-pay | Admitting: Physical Therapy

## 2017-07-19 ENCOUNTER — Other Ambulatory Visit: Payer: Self-pay | Admitting: Family

## 2017-07-19 ENCOUNTER — Ambulatory Visit: Payer: 59 | Admitting: Physical Therapy

## 2017-07-19 DIAGNOSIS — R42 Dizziness and giddiness: Secondary | ICD-10-CM

## 2017-07-19 DIAGNOSIS — R293 Abnormal posture: Secondary | ICD-10-CM

## 2017-07-19 DIAGNOSIS — M542 Cervicalgia: Secondary | ICD-10-CM

## 2017-07-19 NOTE — Therapy (Addendum)
[facility] [address]  [place], [place], [address] Phone: [phone]   Fax:  [phone]  Physical Therapy Treatment  Patient Details  Name: Shannon [name] MRN: [id number] Date of Birth: 08-Sep-1960 Referring Provider: Dr. Karlton Lemon   Encounter Date: 07/19/2017  PT End of Session - 07/19/17 1313    Visit Number  5    Number of Visits  12    Date for PT Re-Evaluation  08/09/17    PT Start Time  1310    PT Stop Time  1400    PT Time Calculation (min)  50 min    Activity Tolerance  Patient tolerated treatment well    Behavior During Therapy  [facility] for tasks assessed/performed       Past Medical History:  Diagnosis Date  . Anemia   . Anxiety   . Depression   . Eating disorder    anorexia nervosa in her 20's  . Fainting spell   . Hx of laminectomy 1994   L4-5  . Kidney stones    passed on their own  . Migraine   . Stomach disorder    due to complications with cholecystectomy "almost died"    Past Surgical History:  Procedure Laterality Date  . APPENDECTOMY  1976  . BREAST BIOPSY Right   . CHOLECYSTECTOMY  08/2009   reports history of gallbladder polyps, she reports stents in duct of lushca   . HYSTEROSCOPY  07/2011  . LAMINECTOMY  1994   L4-5  . TONSILLECTOMY  1982  . TUBAL LIGATION      There were no vitals filed for this visit.  Subjective Assessment - 07/19/17 1521    Subjective  headache today - feels as if with increased neck/shoulder tightness has headaches    Pertinent History  back surgery 22 years ago, medial branch blocks L3-5, DDD    Diagnostic tests  CT scan - negative    Patient Stated Goals  improve balance, memory, headaches, dizziness    Currently in Pain?  Yes    Pain Score  6     Pain Location  Neck and headache    Pain Orientation  Right;Left    Pain Descriptors / Indicators  Aching;Tightness    Pain Type  Chronic pain                      [facility]  Adult PT Treatment/Exercise - 07/19/17 0001      Neck Exercises: Machines for Strengthening   UBE (Upper Arm Bike)  L 2.0 fwd/back x 3 min each      Manual Therapy   Manual Therapy  Soft tissue mobilization;Myofascial release;Passive ROM    Manual therapy comments  prone and hooklying    Soft tissue mobilization  B UT, B LS, B infra, B cervical paraspinals    Myofascial Release  B manual trigger point release to B UT, suboccipital release    Passive ROM  passive stretch for B UT - no pain at end range       Trigger Point Dry Needling - 07/19/17 1529    Consent Given?  Yes    Muscles Treated Upper Body  Upper trapezius;Suboccipitals muscle group;Longissimus    Upper Trapezius Response  Twitch reponse elicited;Palpable increased muscle length    SubOccipitals Response  Twitch response elicited;Palpable increased muscle length    Longissimus Response  Twitch response elicited;Palpable increased muscle length  PT Long Term Goals - 07/07/17 1117      PT LONG TERM GOAL #1   Title  patient to be independent with advanced HEP    Status  On-going      PT LONG TERM GOAL #2   Title  patient to maintain B tandem stance and B SLS for >/= 30 seconds without LOB    Status  On-going      PT LONG TERM GOAL #3   Title  patient to report reduction in headache/dizziness frequency, duration, intensity by >/= 50%    Status  On-going      PT LONG TERM GOAL #4   Title  patient to report ability to return to swimming without limitations    Status  On-going            Plan - 07/19/17 1524    Clinical Impression Statement  Grier with headache today - trying to be more aware of headaches in relation to neck pain/tightness with symptoms seemingly at the same time. Patient tolerable to DN today with palpable reduction in muscle tightness/tension as well as resolution of headache following treatment. Patient reporting getting referral for neuropsychologist for memory  issues.     PT Treatment/Interventions  ADLs/Self Care Home Management;Canalith Repostioning;Cryotherapy;Electrical Stimulation;Moist Heat;Traction;Therapeutic exercise;Therapeutic activities;Balance training;Neuromuscular re-education;Patient/family education;Manual techniques;Taping;Dry needling;Passive range of motion;Visual/perceptual remediation/compensation    Consulted and Agree with Plan of Care  Patient       Patient will benefit from skilled therapeutic intervention in order to improve the following deficits and impairments:  Pain, Decreased balance, Dizziness, Impaired vision/preception  Visit Diagnosis: Dizziness and giddiness  Cervicalgia  Abnormal posture     Problem List Patient Active Problem List   Diagnosis Date Noted  . Concussion without loss of consciousness 06/18/2017  . Cervical spondylosis without myelopathy 02/12/2016  . Spondylosis of lumbar region without myelopathy or radiculopathy 07/15/2015  . Chronic midline posterior neck pain 06/19/2015  . Sacroiliac dysfunction 06/19/2015  . Lumbar radicular pain 06/19/2015  . Postlaminectomy syndrome, lumbar region 06/19/2015  . Rhus dermatitis 10/25/2014  . Raynaud's disease 03/23/2013  . Memory loss 02/19/2013  . Major depression 12/23/2012  . Unspecified constipation 08/23/2012  . Migraine 02/23/2012  . Routine general medical examination at a health care facility 02/15/2012  . Chronic pain 02/17/2011  . ASCUS on Pap smear 02/16/2011  . Anxiety and depression 12/30/2010     Lanney Gins, PT, DPT 07/19/17 3:30 PM  PHYSICAL THERAPY DISCHARGE SUMMARY  Visits from Start of Care: 5  Current functional level related to goals / functional outcomes: See above - some continued memory deficits, following up with MD regarding this. Good progress of pain and dizziness with PT intervention. Patient did not return following last visit - unsure of current status   Remaining deficits: See above    Education / Equipment: HEP  Plan: Patient agrees to discharge.  Patient goals were not met. Patient is being discharged due to not returning since the last visit.  ?????    Lanney Gins, PT, DPT 08/29/17 12:36 PM    [facility] [address]  [place], [place], [address] Phone: [phone]   Fax:  [phone]  Name: Kaitlynd Phillips MRN: [id number] Date of Birth: 13-Apr-1961

## 2017-08-02 ENCOUNTER — Ambulatory Visit: Payer: 59 | Admitting: Family

## 2017-08-02 ENCOUNTER — Encounter: Payer: Self-pay | Admitting: Family

## 2017-08-02 VITALS — BP 100/52 | HR 48 | Temp 98.2°F | Resp 16 | Ht 66.0 in | Wt 126.0 lb

## 2017-08-02 DIAGNOSIS — F329 Major depressive disorder, single episode, unspecified: Secondary | ICD-10-CM | POA: Diagnosis not present

## 2017-08-02 DIAGNOSIS — F419 Anxiety disorder, unspecified: Secondary | ICD-10-CM | POA: Diagnosis not present

## 2017-08-02 DIAGNOSIS — S060X0D Concussion without loss of consciousness, subsequent encounter: Secondary | ICD-10-CM | POA: Diagnosis not present

## 2017-08-02 DIAGNOSIS — F32A Depression, unspecified: Secondary | ICD-10-CM

## 2017-08-02 MED ORDER — BUPROPION HCL ER (XL) 150 MG PO TB24: 150.0000 mg | ORAL_TABLET | Freq: Every day | ORAL | 5 refills | Status: DC

## 2017-08-02 MED ORDER — CLONAZEPAM 0.5 MG PO TABS: ORAL_TABLET | ORAL | 0 refills | Status: DC

## 2017-08-02 NOTE — Patient Instructions (Signed)
Please decrease wellbutrin to 150mg .

## 2017-08-02 NOTE — Progress Notes (Signed)
Subjective:    Patient ID: Shannon [name], female    DOB: Apr 28, 1961, 57 y.o.   MRN: [id number]  HPI  Shannon [name] is a 57 yr old female who presents today for follow up.  Depression- reports overall depression is well controlled. She reports teariness is resolved,    She is watching her grandchild 4 days a week which she is very happy about  Tried to taper the wellbutrin last week.  She is taking wellbutrin xl 300mg  every 48 hours. Feels ok with this regimen and "wants to try to come off wellbutrin for the summer."  Concussion- will be meeting with neuropsychologist. Feels like her memory still isn't great but her headaches are resolved.   Review of Systems    see HPI  Past Medical History:  Diagnosis Date  . Anemia   . Anxiety   . Depression   . Eating disorder    anorexia nervosa in her 20's  . Fainting spell   . Hx of laminectomy 1994   L4-5  . Kidney stones    passed on their own  . Migraine   . Stomach disorder    due to complications with cholecystectomy "almost died"     Social History   Socioeconomic History  . Marital status: Married    Spouse name: Not on file  . Number of children: 3  . Years of education: Not on file  . Highest education level: Not on file  Occupational History    Employer: SELF EMPLOYED  Social Needs  . Financial resource strain: Not on file  . Food insecurity:    Worry: Not on file    Inability: Not on file  . Transportation needs:    Medical: Not on file    Non-medical: Not on file  Tobacco Use  . Smoking status: Never Smoker  . Smokeless tobacco: Never Used  Substance and Sexual Activity  . Alcohol use: No  . Drug use: Yes  . Sexual activity: Yes    Partners: Male    Birth control/protection: Surgical  Lifestyle  . Physical activity:    Days per week: Not on file    Minutes per session: Not on file  . Stress: Not on file  Relationships  . Social connections:    Talks on phone: Not on file    Gets together:  Not on file    Attends religious service: Not on file    Active member of club or organization: Not on file    Attends meetings of clubs or organizations: Not on file    Relationship status: Not on file  . Intimate partner violence:    Fear of current or ex partner: Not on file    Emotionally abused: Not on file    Physically abused: Not on file    Forced sexual activity: Not on file  Other Topics Concern  . Not on file  Social History Narrative   Regular exercise:  Yes (swim instructor)   Caffeine Use:  1 daily   Married 3 children ages 27 son, 95 son, and 70 daughter   Associates degree                Past Surgical History:  Procedure Laterality Date  . APPENDECTOMY  1976  . BREAST BIOPSY Right   . CHOLECYSTECTOMY  08/2009   reports history of gallbladder polyps, she reports stents in duct of lushca   . HYSTEROSCOPY  07/2011  . LAMINECTOMY  1994  L4-5  . TONSILLECTOMY  1982  . TUBAL LIGATION      Family History  Problem Relation Age of Onset  . Hypertension Mother   . Diabetes Father   . Hypertension Father   . Hypothyroidism Father   . CAD Father   . Alcohol abuse Maternal Grandmother   . Cancer Paternal Grandfather        lung cancer  . Hypothyroidism Other   . Hashimoto's thyroiditis Sister   . Hypothyroidism Sister   . Neuropathy Sister        chronic inflammatory demyelinating peripheral neuropathy from lyme disease    Allergies  Allergen Reactions  . Buspar [Buspirone]     constipation  . Eszopiclone     Headaches  . Flexeril [Cyclobenzaprine Hcl] Other (See Comments)    Mood swings  . Gabapentin     sedation  . Morphine And Related Other (See Comments)    No relief  . Nsaids Other (See Comments)    GI bleed  . Paroxetine Nausea Only       . Shellfish Allergy Other (See Comments)    skin & mouth tingle  . Tizanidine Hcl Other (See Comments)    Dizziness, mood swings    Current Outpatient Medications on File Prior to Visit    Medication Sig Dispense Refill  . traMADol (ULTRAM) 50 MG tablet TAKE TWO TABLETS BY MOUTH THREE TIMES A DAY 180 tablet 4   No current facility-administered medications on file prior to visit.     BP (!) 100/52 (BP Location: Left Arm, Patient Position: Sitting, Cuff Size: Small)   Pulse (!) 48   Temp 98.2 F (36.8 C) (Oral)   Resp 16   Ht 5\' 6"  (1.676 m)   Wt 126 lb (57.2 kg)   SpO2 100%   BMI 20.34 kg/m    Objective:   Physical Exam  Constitutional: She is oriented to person, place, and time. She appears well-developed and well-nourished.  Cardiovascular: Normal rate, regular rhythm and normal heart sounds.  No murmur heard. Pulmonary/Chest: Effort normal and breath sounds normal. No respiratory distress. She has no wheezes.  Musculoskeletal: She exhibits no edema.  Neurological: She is alert and oriented to person, place, and time.  Psychiatric: She has a normal mood and affect. Her behavior is normal. Judgment and thought content normal.          Assessment & Plan:  Depression- stable. I cautioned her against coming off of wellbutrin all together. Will try decreasing to 150mg  xl once daily and re-evaluate her progress in 1 months.   Concussion- improving overall, she will keep her upcoming appointment with the neuropsychologist.  Anxiety- stable, refill sent on klonopin.

## 2017-08-04 ENCOUNTER — Ambulatory Visit: Payer: Self-pay | Admitting: Family Medicine

## 2017-08-11 ENCOUNTER — Ambulatory Visit: Payer: 59 | Admitting: Physical Medicine & Rehabilitation

## 2017-08-11 ENCOUNTER — Encounter: Payer: Self-pay | Admitting: Physical Medicine & Rehabilitation

## 2017-08-11 ENCOUNTER — Encounter: Payer: 59 | Attending: Physical Medicine & Rehabilitation

## 2017-08-11 VITALS — BP 110/72 | HR 54 | Resp 14 | Wt 127.0 lb

## 2017-08-11 DIAGNOSIS — Z5181 Encounter for therapeutic drug level monitoring: Secondary | ICD-10-CM

## 2017-08-11 DIAGNOSIS — M533 Sacrococcygeal disorders, not elsewhere classified: Secondary | ICD-10-CM | POA: Diagnosis not present

## 2017-08-11 DIAGNOSIS — Z79891 Long term (current) use of opiate analgesic: Secondary | ICD-10-CM | POA: Diagnosis not present

## 2017-08-11 DIAGNOSIS — F419 Anxiety disorder, unspecified: Secondary | ICD-10-CM | POA: Insufficient documentation

## 2017-08-11 DIAGNOSIS — G894 Chronic pain syndrome: Secondary | ICD-10-CM | POA: Diagnosis not present

## 2017-08-11 DIAGNOSIS — M47816 Spondylosis without myelopathy or radiculopathy, lumbar region: Secondary | ICD-10-CM | POA: Diagnosis present

## 2017-08-11 DIAGNOSIS — F329 Major depressive disorder, single episode, unspecified: Secondary | ICD-10-CM | POA: Insufficient documentation

## 2017-08-11 MED ORDER — TRAMADOL HCL 50 MG PO TABS: 100.0000 mg | ORAL_TABLET | Freq: Three times a day (TID) | ORAL | 0 refills | Status: DC

## 2017-08-11 NOTE — Progress Notes (Signed)
  PROCEDURE RECORD [facility]   Name: Shannon [name] DOB:[birth year] MRN: [id number]  Date:08/11/2017  Physician: Alysia Penna, MD    Nurse/CMA: Bright, CMA  Allergies:  Allergies  Allergen Reactions  . Buprenorphine Hcl     Other reaction(s): Other (See Comments) No relief  . Buspar [Buspirone]     constipation  . Eszopiclone     Headaches  . Flexeril [Cyclobenzaprine Hcl] Other (See Comments)    Mood swings  . Gabapentin     sedation  . Morphine And Related Other (See Comments)    No relief  . Nsaids Other (See Comments)    GI bleed  . Paroxetine Nausea Only       . Shellfish Allergy Other (See Comments)    skin & mouth tingle  . Tizanidine Hcl Other (See Comments)    Dizziness, mood swings  . Tolmetin     Other reaction(s): Other (See Comments) GI bleed    Consent Signed: Yes.    Is patient diabetic? No.  CBG today? NA  Pregnant: No. LMP: No LMP recorded. Patient is perimenopausal. (age 76-55)  Anticoagulants: no Anti-inflammatory: no Antibiotics: no  Procedure: right sacroiliac  Position: Prone   Start Time: 1000am  End Time: 1002am  Fluoro Time: 10s  RN/CMA Ilo Beamon, CMA Bright, CMA    Time 9:45am 1005am    BP 110/72 103/64    Pulse 54 53    Respirations 14 14    O2 Sat 98 98    S/S 6 6    Pain Level 7/10 3/10     D/C home with husband, patient A & O X 3, D/C instructions reviewed, and sits independently.

## 2017-08-11 NOTE — Patient Instructions (Addendum)
Sacroiliac injection was performed today. A combination of a naming medicine plus a cortisone medicine was injected. The injection was done under x-ray guidance. This procedure has been performed to help reduce low back and buttocks pain as well as potentially hip pain. The duration of this injection is variable lasting from hours to  Months. It may repeated if needed.  Due to increased regulation now needing to monitor Urine drug screen and get controlled substance agreement with tramadol Do not drink alcohol while taking tramadol and klonopin One month supply of tramadol pending UDS result  Call if you'd like to schedule another injection

## 2017-08-16 ENCOUNTER — Telehealth: Payer: Self-pay

## 2017-08-16 NOTE — Telephone Encounter (Signed)
Called pt and informed her that her PCP is not in the office this week and that she would have to come in and see another provider for her medication to be increased. Pt stated that she would just wait for her follow up appt with Shannon [name] on 09/01/17 to talk to her about it.

## 2017-08-16 NOTE — Telephone Encounter (Signed)
PCP out of the office this week- will need an appt with another provider to discuss increasing medication.

## 2017-08-16 NOTE — Telephone Encounter (Signed)
Copied from [place] [phone]. Topic: Quick Communication - See Telephone Encounter >> Aug 16, 2017  8:13 AM Oneta Rack wrote: Relation to pt: self Call back number:[phone]   Reason for call:  Patient would like PCP to increase buPROPion (WELLBUTRIN XL) 150 MG 24 hr tablet back up to buPROPion (WELLBUTRIN XL) 300 MG 24 hr  due to dealing with "family concerns" , patient has a follow up appointment with PCP for 09/01/17, please advise.  >> Aug 16, 2017  8:18 AM Oneta Rack wrote: Relation to pt: self Call back number:[phone]   Reason for call:  Patient would like PCP to increase buPROPion (WELLBUTRIN XL) 150 MG 24 hr tablet back up to buPROPion (WELLBUTRIN XL) 300 MG 24 hr  due to dealing with "family concerns" , patient has a follow up appointment with PCP for 09/01/17, please advise.

## 2017-08-26 LAB — TOXASSURE SELECT,+ANTIDEPR,UR

## 2017-08-29 ENCOUNTER — Telehealth: Payer: Self-pay | Admitting: *Deleted

## 2017-08-29 NOTE — Telephone Encounter (Signed)
Please give warning that if another + ETOH is obtained we will be unable to Rx tramadol

## 2017-08-29 NOTE — Telephone Encounter (Signed)
Urine drug screen is positive for alcohol as well as tramadol.  Therefore it is inconsistent.

## 2017-08-31 ENCOUNTER — Other Ambulatory Visit: Payer: Self-pay | Admitting: Family

## 2017-08-31 NOTE — Telephone Encounter (Signed)
Request for refill of Clonazepam 0.5mg , last filled on 08/02/17 #80  LOV:08/02/17 Melissa O'Sullivan  [facility] [address] [place]

## 2017-08-31 NOTE — Telephone Encounter (Signed)
Last clonazepam RX: 08/02/17, #80 Last OV: 08/02/17 Next OV: 09/02/17  UDS: 03/10/17, moderate risk CSC: 03/10/17 CSR: No discrepancies identified

## 2017-08-31 NOTE — Telephone Encounter (Signed)
Copied from [place] [phone] [id number]. Topic: Quick Communication - Rx Refill/Question >> Aug 31, 2017 10:07 AM [place], Laban Emperor wrote: Medication: clonazePAM (KLONOPIN) 0.5 MG tablet [[id number]]  Has the patient contacted their pharmacy? No. (Agent: If no, request that the patient contact the pharmacy for the refill.) Preferred Pharmacy (with phone number or street name): [place], [address] [phone] (Phone) [phone] (Fax)     Agent: Please be advised that RX refills may take up to 3 business days. We ask that you follow-up with your pharmacy.

## 2017-08-31 NOTE — Telephone Encounter (Signed)
Formal warning letter mailed to Ms Critzer.

## 2017-09-01 MED ORDER — CLONAZEPAM 0.5 MG PO TABS: ORAL_TABLET | ORAL | 0 refills | Status: DC

## 2017-09-02 ENCOUNTER — Encounter: Payer: Self-pay | Admitting: Family

## 2017-09-02 ENCOUNTER — Ambulatory Visit: Payer: 59 | Admitting: Family

## 2017-09-02 VITALS — BP 112/51 | HR 53 | Temp 97.6°F | Resp 16 | Ht 66.0 in | Wt 124.0 lb

## 2017-09-02 DIAGNOSIS — Z79899 Other long term (current) drug therapy: Secondary | ICD-10-CM | POA: Diagnosis not present

## 2017-09-02 DIAGNOSIS — F32A Depression, unspecified: Secondary | ICD-10-CM

## 2017-09-02 DIAGNOSIS — F329 Major depressive disorder, single episode, unspecified: Secondary | ICD-10-CM | POA: Diagnosis not present
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
## 2017-09-05 LAB — PAIN MGMT, PROFILE 8 W/CONF, U
6 ACETYLMORPHINE: NEGATIVE ng/mL (ref <10)
ALPHAHYDROXYTRIAZOLAM: NEGATIVE ng/mL (ref <50)
Alcohol Metabolites: POSITIVE ng/mL — AB (ref <500)
Alphahydroxyalprazolam: NEGATIVE ng/mL (ref <25)
Alphahydroxymidazolam: NEGATIVE ng/mL (ref <50)
Aminoclonazepam: 1196 ng/mL — ABNORMAL HIGH (ref <25)
Amphetamines: NEGATIVE ng/mL (ref <500)
BENZODIAZEPINES: POSITIVE ng/mL — AB (ref <100)
BUPRENORPHINE, URINE: NEGATIVE ng/mL (ref <5)
Cocaine Metabolite: NEGATIVE ng/mL (ref <150)
Creatinine: 210.5 mg/dL
Ethyl Glucuronide (ETG): 90726 ng/mL — ABNORMAL HIGH (ref <500)
Ethyl Sulfate (ETS): 18302 ng/mL — ABNORMAL HIGH (ref <100)
Hydroxyethylflurazepam: NEGATIVE ng/mL (ref <50)
Lorazepam: NEGATIVE ng/mL (ref <50)
MARIJUANA METABOLITE: NEGATIVE ng/mL (ref <20)
MDMA: NEGATIVE ng/mL (ref <500)
Nordiazepam: NEGATIVE ng/mL (ref <50)
OXAZEPAM: NEGATIVE ng/mL (ref <50)
OXYCODONE: NEGATIVE ng/mL (ref <100)
Opiates: NEGATIVE ng/mL (ref <100)
Oxidant: NEGATIVE ug/mL (ref <200)
Temazepam: NEGATIVE ng/mL (ref <50)
pH: 6.74 (ref 4.5–9.0)

## 2017-09-06 ENCOUNTER — Telehealth: Payer: Self-pay | Admitting: Family

## 2017-09-06 NOTE — Telephone Encounter (Signed)
See result notes. 

## 2017-09-06 NOTE — Telephone Encounter (Signed)
Copied from [place] [id number]. Topic: Quick Communication - Lab Results >> Sep 05, 2017  7:21 PM Ronny Flurry, CMA wrote: See lab result note from 09/05/17. [place] for [facility] / Triage to discuss with patient. Thanks!  Pt calling back for lab results.

## 2017-09-06 NOTE — Telephone Encounter (Signed)
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

## 2017-09-06 NOTE — Telephone Encounter (Signed)
See result note.  

## 2017-09-08 ENCOUNTER — Telehealth: Payer: Self-pay | Admitting: Physical Medicine & Rehabilitation

## 2017-09-08 ENCOUNTER — Ambulatory Visit: Payer: Self-pay

## 2017-09-08 ENCOUNTER — Ambulatory Visit: Payer: Self-pay | Admitting: Physical Medicine & Rehabilitation

## 2017-09-08 NOTE — Telephone Encounter (Signed)
Patient is calling to request a copy of urine drug screen.  Please call patient if she can get a copy of this.  She has an appointment with Dr. Letta Pate and wants to speak with him about it and would like those results before she comes in to see him.

## 2017-09-13 ENCOUNTER — Encounter: Payer: 59 | Attending: Physical Medicine & Rehabilitation

## 2017-09-13 ENCOUNTER — Encounter: Payer: Self-pay | Admitting: Physical Medicine & Rehabilitation

## 2017-09-13 ENCOUNTER — Ambulatory Visit: Payer: 59 | Admitting: Physical Medicine & Rehabilitation

## 2017-09-13 ENCOUNTER — Other Ambulatory Visit: Payer: Self-pay

## 2017-09-13 VITALS — BP 112/77 | HR 57 | Ht 66.0 in | Wt 126.0 lb

## 2017-09-13 DIAGNOSIS — M47816 Spondylosis without myelopathy or radiculopathy, lumbar region: Secondary | ICD-10-CM | POA: Diagnosis not present

## 2017-09-13 DIAGNOSIS — M533 Sacrococcygeal disorders, not elsewhere classified: Secondary | ICD-10-CM | POA: Insufficient documentation

## 2017-09-13 DIAGNOSIS — F419 Anxiety disorder, unspecified: Secondary | ICD-10-CM | POA: Diagnosis not present

## 2017-09-13 DIAGNOSIS — F329 Major depressive disorder, single episode, unspecified: Secondary | ICD-10-CM | POA: Diagnosis not present

## 2017-09-13 DIAGNOSIS — M961 Postlaminectomy syndrome, not elsewhere classified: Secondary | ICD-10-CM

## 2017-09-13 NOTE — Progress Notes (Addendum)
Subjective:    Patient ID: Shannon [name], female    DOB: 1960/07/12, 57 y.o.   MRN: [id number]  HPI Chief complaint is left-sided buttocks pain  Right sided low back pain did have improvements after lumbar medial branch blocks as well as right sacroiliac injection  Weaning down on tramadol from 6 tabs per day to 3 tabs per day  Left hip more painful than before, No falls or trauma.  Worse with walking.  Improves with laying flat, sitting increases pain Walking and pushing off increases pain patient is able to bend forward But has pain when straightening up.  No numbness or tingling pain radiates down Lateral thigh  Snapping in front of knee not on lateral aspect  In pool more for the last 2 weeks and is hoping that aquatic exercise will improve her overall pain complaints  Pain Inventory Average Pain 7 Pain Right Now 7 My pain is constant, sharp and burning  In the last 24 hours, has pain interfered with the following? General activity 3 Relation with others 0 Enjoyment of life 0 What TIME of day is your pain at its worst? morning and daytime Sleep (in general) Fair  Pain is worse with: bending, sitting and some activites Pain improves with: medication and injections Relief from Meds: 7  Mobility Do you have any goals in this area?  no  Function Do you have any goals in this area?  no  Neuro/Psych anxiety  Prior Studies Any changes since last visit?  no  Physicians involved in your care Any changes since last visit?  no   Family History  Problem Relation Age of Onset  . Hypertension Mother   . Diabetes Father   . Hypertension Father   . Hypothyroidism Father   . CAD Father   . Alcohol abuse Maternal Grandmother   . Cancer Paternal Grandfather        lung cancer  . Hypothyroidism Other   . Hashimoto's thyroiditis Sister   . Hypothyroidism Sister   . Neuropathy Sister        chronic inflammatory demyelinating peripheral neuropathy from lyme  disease   Social History   Socioeconomic History  . Marital status: Married    Spouse name: Not on file  . Number of children: 3  . Years of education: Not on file  . Highest education level: Not on file  Occupational History    Employer: SELF EMPLOYED  Social Needs  . Financial resource strain: Not on file  . Food insecurity:    Worry: Not on file    Inability: Not on file  . Transportation needs:    Medical: Not on file    Non-medical: Not on file  Tobacco Use  . Smoking status: Never Smoker  . Smokeless tobacco: Never Used  Substance and Sexual Activity  . Alcohol use: No  . Drug use: Yes  . Sexual activity: Yes    Partners: Male    Birth control/protection: Surgical  Lifestyle  . Physical activity:    Days per week: Not on file    Minutes per session: Not on file  . Stress: Not on file  Relationships  . Social connections:    Talks on phone: Not on file    Gets together: Not on file    Attends religious service: Not on file    Active member of club or organization: Not on file    Attends meetings of clubs or organizations: Not on file    Relationship  status: Not on file  Other Topics Concern  . Not on file  Social History Narrative   Regular exercise:  Yes (swim instructor)   Caffeine Use:  1 daily   Married 3 children ages 30 son, 63 son, and 17 daughter   Associates degree               Past Surgical History:  Procedure Laterality Date  . APPENDECTOMY  1976  . BREAST BIOPSY Right   . CHOLECYSTECTOMY  08/2009   reports history of gallbladder polyps, she reports stents in duct of lushca   . HYSTEROSCOPY  07/2011  . LAMINECTOMY  1994   L4-5  . TONSILLECTOMY  1982  . TUBAL LIGATION     Past Medical History:  Diagnosis Date  . Anemia   . Anxiety   . Depression   . Eating disorder    anorexia nervosa in her 20's  . Fainting spell   . Hx of laminectomy 1994   L4-5  . Kidney stones    passed on their own  . Migraine   . Stomach disorder     due to complications with cholecystectomy "almost died"   BP 112/77   Pulse (!) 57   Ht 5\' 6"  (1.676 m) Comment: pt reported  Wt 126 lb (57.2 kg)   SpO2 98%   BMI 20.34 kg/m   Opioid Risk Score:   Fall Risk Score:  `1  Depression screen PHQ 2/9  Depression screen [facility] 2/9 09/13/2017 09/13/2017 09/02/2017 04/04/2017 01/24/2017 07/15/2015 06/27/2015  Decreased Interest 0 0 0 2 1 1 1   Down, Depressed, Hopeless 0 0 1 2 2  0 0  PHQ - 2 Score 0 0 1 4 3 1 1   Altered sleeping - - 3 3 3  - -  Tired, decreased energy - - 1 1 2  - -  Change in appetite - - 1 3 3  - -  Feeling bad or failure about yourself  - - 1 2 1  - -  Trouble concentrating - - 3 2 3  - -  Moving slowly or fidgety/restless - - 0 0 1 - -  Suicidal thoughts - - 0 0 0 - -  PHQ-9 Score - - 10 15 16  - -  Difficult doing work/chores - - Somewhat difficult Somewhat difficult - - -   Review of Systems  Constitutional: Negative.   HENT: Negative.   Eyes: Negative.   Respiratory: Negative.   Cardiovascular: Negative.   Gastrointestinal: Negative.   Endocrine: Negative.   Genitourinary: Negative.   Musculoskeletal: Negative.   Skin: Negative.   Allergic/Immunologic: Negative.   Neurological: Negative.   Hematological: Negative.   Psychiatric/Behavioral: Negative.   All other systems reviewed and are negative.      Objective:   Physical Exam  Constitutional: She is oriented to person, place, and time. She appears well-developed and well-nourished.  HENT:  Head: Normocephalic and atraumatic.  Eyes: Pupils are equal, round, and reactive to light. EOM are normal.  Neck: Normal range of motion. Neck supple.  Musculoskeletal: She exhibits tenderness. She exhibits no edema or deformity.       Right hip: She exhibits tenderness. She exhibits normal range of motion, normal strength and no deformity.       Left hip: She exhibits decreased range of motion and tenderness. She exhibits normal strength and no deformity.       Right knee:  Normal.       Left knee: Normal.  Lumbar back: She exhibits tenderness. She exhibits normal range of motion, no deformity and no spasm.  Patient has some tenderness left PSIS  No evidence of lumbar scoliosis or thoracic kyphosis  Neurological: She is alert and oriented to person, place, and time.  Skin: Skin is warm and dry.   Positive pain over the left PSIS with Faber's maneuver      Assessment & Plan:  1.  Left-sided hip pain and buttock pain consistent with sacroiliac disorder, we discussed treatment options including chiropractic treatment physical therapy or sacroiliac injection.  We discussed that sacroiliac injection would be diagnostic as well and would help confirm the diagnosis. Patient will schedule this. 2.  Lumbar postlaminectomy syndrome  She will continue to wean her tramadol she is down to 50 mill grams 3 times daily instructed patient reduce by 1 tablet/week until she is off of this.  She will no longer have a controlled substance agreement with this office.

## 2017-09-13 NOTE — Patient Instructions (Signed)
Sacroiliac Joint Dysfunction Sacroiliac joint dysfunction is a condition that causes inflammation on one or both sides of the sacroiliac (SI) joint. The SI joint connects the lower part of the spine (sacrum) with the two upper portions of the pelvis (ilium). This condition causes deep aching or burning pain in the low back. In some cases, the pain may also spread into one or both buttocks or hips or spread down the legs. What are the causes? This condition may be caused by:  Pregnancy. During pregnancy, extra stress is put on the SI joints because the pelvis widens.  Injury, such as: ? Car accidents. ? Sport-related injuries. ? Work-related injuries.  Having one leg that is shorter than the other.  Conditions that affect the joints, such as: ? Rheumatoid arthritis. ? Gout. ? Psoriatic arthritis. ? Joint infection (septic arthritis).  Sometimes, the cause of SI joint dysfunction is not known. What are the signs or symptoms? Symptoms of this condition include:  Aching or burning pain in the lower back. The pain may also spread to other areas, such as: ? Buttocks. ? Groin. ? Thighs and legs.  Muscle spasms in or around the painful areas.  Increased pain when standing, walking, running, stair climbing, bending, or lifting.  How is this diagnosed? Your health care provider will do a physical exam and take your medical history. During the exam, the health care provider may move one or both of your legs to different positions to check for pain. Various tests may be done to help verify the diagnosis, including:  Imaging tests to look for other causes of pain. These may include: ? MRI. ? CT scan. ? Bone scan.  Diagnostic injection. A numbing medicine is injected into the SI joint using a needle. If the pain is temporarily improved or stopped after the injection, this can indicate that SI joint dysfunction is the problem.  How is this treated? Treatment may vary depending on the  cause and severity of your condition. Treatment options may include:  Applying ice or heat to the lower back area. This can help to reduce pain and muscle spasms.  Medicines to relieve pain or inflammation or to relax the muscles.  Wearing a back brace (sacroiliac brace) to help support the joint while your back is healing.  Physical therapy to increase muscle strength around the joint and flexibility at the joint. This may also involve learning proper body positions and ways of moving to relieve stress on the joint.  Direct manipulation of the SI joint.  Injections of steroid medicine into the joint in order to reduce pain and swelling.  Radiofrequency ablation to burn away nerves that are carrying pain messages from the joint.  Use of a device that provides electrical stimulation in order to reduce pain at the joint.  Surgery to put in screws and plates that limit or prevent joint motion. This is rare.  Follow these instructions at home:  Rest as needed. Limit your activities as directed by your health care provider.  Take medicines only as directed by your health care provider.  If directed, apply ice to the affected area: ? Put ice in a plastic bag. ? Place a towel between your skin and the bag. ? Leave the ice on for 20 minutes, 2-3 times per day.  Use a heating pad or a moist heat pack as directed by your health care provider.  Exercise as directed by your health care provider or physical therapist.  Keep all follow-up visits   as directed by your health care provider. This is important. Contact a health care provider if:  Your pain is not controlled with medicine.  You have a fever.  You have increasingly severe pain. Get help right away if:  You have weakness, numbness, or tingling in your legs or feet.  You lose control of your bladder or bowel. This information is not intended to replace advice given to you by your health care provider. Make sure you discuss  any questions you have with your health care provider. Document Released: 07/16/2008 Document Revised: 09/25/2015 Document Reviewed: 12/25/2013 Elsevier Interactive Patient Education  2018 Elsevier Inc.  

## 2017-09-20 ENCOUNTER — Ambulatory Visit (HOSPITAL_BASED_OUTPATIENT_CLINIC_OR_DEPARTMENT_OTHER): Payer: 59 | Admitting: Physical Medicine & Rehabilitation

## 2017-09-20 VITALS — BP 99/64 | HR 54 | Wt 126.0 lb

## 2017-09-20 DIAGNOSIS — M533 Sacrococcygeal disorders, not elsewhere classified: Secondary | ICD-10-CM

## 2017-09-20 DIAGNOSIS — M47816 Spondylosis without myelopathy or radiculopathy, lumbar region: Secondary | ICD-10-CM | POA: Diagnosis not present

## 2017-09-20 DIAGNOSIS — M961 Postlaminectomy syndrome, not elsewhere classified: Secondary | ICD-10-CM

## 2017-09-20 NOTE — Patient Instructions (Signed)
Sacroiliac injection was performed today. A combination of a naming medicine plus a cortisone medicine was injected. The injection was done under x-ray guidance. This procedure has been performed to help reduce low back and buttocks pain as well as potentially hip pain. The duration of this injection is variable lasting from hours to  Months. It may repeated if needed. 

## 2017-09-20 NOTE — Progress Notes (Signed)

## 2017-09-20 NOTE — Progress Notes (Signed)
  PROCEDURE RECORD [facility]   Name: Shannon [name] DOB:1960-08-03 MRN: [id number]  Date:09/20/2017  Physician: Alysia Penna, MD    Nurse/CMA: Betti Cruz  Allergies:  Allergies  Allergen Reactions  . Buprenorphine Hcl     Other reaction(s): Other (See Comments) No relief  . Buspar [Buspirone]     constipation  . Eszopiclone     Headaches  . Flexeril [Cyclobenzaprine Hcl] Other (See Comments)    Mood swings  . Gabapentin     sedation  . Morphine And Related Other (See Comments)    No relief  . Nsaids Other (See Comments)    GI bleed  . Paroxetine Nausea Only       . Shellfish Allergy Other (See Comments)    skin & mouth tingle  . Tizanidine Hcl Other (See Comments)    Dizziness, mood swings  . Tolmetin     Other reaction(s): Other (See Comments) GI bleed    Consent Signed: Yes.    Is patient diabetic? No.  CBG today?   Pregnant: No. LMP: No LMP recorded. Patient is perimenopausal. (age 83-55)  Anticoagulants: no Anti-inflammatory: no Antibiotics: no  Procedure: Left sacroiliac Position: Prone Start Time: 10:13am End Time: 10:18am Fluoro Time: 15  RN/CMA Joelynn Dust,CMA Alohilani Levenhagen,CMA    Time 9:44am 10:23am    BP 99/64 110/59    Pulse 54 52    Respirations 14 14    O2 Sat 98 98    S/S 6/6 6/6    Pain Level 8/10 4/10     D/C home with no one, no driver needed, patient A & O X 3, D/C instructions reviewed, and sits independently.

## 2017-09-28 ENCOUNTER — Other Ambulatory Visit: Payer: Self-pay | Admitting: Family

## 2017-09-28 NOTE — Telephone Encounter (Signed)
Last Clonazepam RX: 09/01/17, #80 x no refills Last OV: 09/02/17 Next OV: 12/26/17 UDS: 09/02/17, moderate  CSC: 03/10/17 CSR: No discrepancies identified

## 2017-10-04 ENCOUNTER — Telehealth: Payer: Self-pay | Admitting: *Deleted

## 2017-10-04 NOTE — Telephone Encounter (Signed)
Patient is reporting that last hip injection failed. Patient reports no relief. Patient has upcoming appt for repeat injection.  She is asking now if there are any alternatives before she comes in for the appointment.  She says she is reluctant to come in if her only option is the same injection she just had.

## 2017-10-04 NOTE — Telephone Encounter (Signed)
We can perform L3-L4 medial branch L5 dorsal ramus injection.  This is different than the sacroiliac injection

## 2017-10-05 NOTE — Telephone Encounter (Signed)
Patient states she has had MBB's in the past and thought that was mostly for back pain.  She is asking if it will help with her hip pain.  She say she cannot tolerate laying on her side, bilaterally. Has been sleeping on her back which has led to poor sleep. Anyways, she just wants clarity that this injection will help her hip pain

## 2017-10-06 NOTE — Telephone Encounter (Signed)
Can do troch bursa, I'd need to examine her to be sure what is really hurting her

## 2017-10-06 NOTE — Telephone Encounter (Signed)
Left information on VM per DPR.  She has appt 10/17/17 for SI injection.  I told her she can just keep that appt and be assessed at that time or she can call if she wants to try and move appt up.

## 2017-10-10 DIAGNOSIS — R413 Other amnesia: Secondary | ICD-10-CM | POA: Diagnosis not present

## 2017-10-17 ENCOUNTER — Encounter: Payer: 59 | Attending: Physical Medicine & Rehabilitation

## 2017-10-17 ENCOUNTER — Ambulatory Visit: Payer: 59 | Admitting: Physical Medicine & Rehabilitation

## 2017-10-17 DIAGNOSIS — M533 Sacrococcygeal disorders, not elsewhere classified: Secondary | ICD-10-CM | POA: Insufficient documentation

## 2017-10-17 DIAGNOSIS — M47816 Spondylosis without myelopathy or radiculopathy, lumbar region: Secondary | ICD-10-CM | POA: Insufficient documentation

## 2017-10-17 DIAGNOSIS — F329 Major depressive disorder, single episode, unspecified: Secondary | ICD-10-CM | POA: Insufficient documentation

## 2017-10-17 DIAGNOSIS — F419 Anxiety disorder, unspecified: Secondary | ICD-10-CM | POA: Insufficient documentation

## 2017-10-20 DIAGNOSIS — F0670 Mild neurocognitive disorder due to known physiological condition without behavioral disturbance: Secondary | ICD-10-CM

## 2017-10-20 HISTORY — DX: Mild neurocognitive disorder due to known physiological condition without behavioral disturbance: F06.70

## 2017-10-21 ENCOUNTER — Ambulatory Visit (INDEPENDENT_AMBULATORY_CARE_PROVIDER_SITE_OTHER): Payer: 59 | Admitting: Medical

## 2017-10-21 ENCOUNTER — Encounter: Payer: Self-pay | Admitting: Medical

## 2017-10-21 VITALS — BP 100/77 | HR 58 | Temp 97.9°F | Resp 16 | Ht 66.0 in | Wt 124.2 lb

## 2017-10-21 DIAGNOSIS — G43809 Other migraine, not intractable, without status migrainosus: Secondary | ICD-10-CM

## 2017-10-21 MED ORDER — SUMATRIPTAN SUCCINATE 50 MG PO TABS: 50.0000 mg | ORAL_TABLET | ORAL | 0 refills | Status: DC | PRN

## 2017-10-21 NOTE — Patient Instructions (Addendum)
You do have history of migraine type headaches and some features presently on your description.  Symptoms have waxed and waned for 2 weeks and brief episodes of relief when in dark room and is sleeping.  Historically you report responding well to Toradol so we will give that today at 60 mg IM dose and give Phenergan 25 mg IM.  Your husband drove you here and recommend that he drive you home as well due to medication side effects.  Hopefully this will stop your headache.  If no impact on your headache/persisting signs and symptoms then recommend ED evaluation.  Your report responding well to probable Imitrex years ago.  I am going to prescribe that again for early onset migraine type headaches.  Recent CT in January was negative.  I do think we need to follow you closely and if your headache features are worsening then you might need MRI or referral to neurologist.  If you get a headache with gross motor/sensory function deficits then recommend being seen in the emergency department as well.  Recommend follow-up in 7 to 10days with PCP or myself.  Also follow-up as needed.  We need to assess your response to the above treatments and make decision if further medications needed, imaging needed or possible referral to neurologist.  Note your dizziness has resolved.  I do think that if you have dizziness that is persisting and significant for more than 5 minutes that trying meclizine would be helpful.

## 2017-10-21 NOTE — Progress Notes (Signed)
Subjective:    Patient ID: Shannon [name], female    DOB: Apr 01, 1961, 57 y.o.   MRN: [id number]  HPI  Pt is in with HA for about 2 weeks.She states ha has been daily. She states cold compress to head and sleeping would decrease ha.   She state some nausea, light sensitivity, ringing to left ear and rt side headache. Pt states she has driver with her in event we give her injection. Pt states can't oral nsaids but can tolerate toradol injection.(husband here to drive her home)  Pt states years ago about 5 years ago neurologist gave possible imitrex and that seemed to help.  Ct head after fall and concussion in January was negative.  Today she had episode of severe dizziness this morning 3am when she got out of bed. Was very dizzy. She had similar brief episode like this in march after she suffered her concussion.  The dizziness last from 3 am to about 10 am today. She states today  sat in chair  and was fine as long as did not make rapid head movements. Since 10 am dizziness stopped. No gross motor or sensory function deficits reported.   Her dizziness has stopped   Pt ha level now 7/10. Typically increases throughout the day.   Review of Systems  Constitutional: Negative for chills, fatigue and fever.  HENT: Negative for dental problem.   Eyes: Positive for photophobia. Negative for pain and visual disturbance.  Respiratory: Negative for cough, choking, shortness of breath and wheezing.   Cardiovascular: Negative for chest pain and palpitations.  Gastrointestinal: Positive for nausea.  Musculoskeletal: Negative for back pain, myalgias and neck pain.  Neurological: Positive for headaches. Negative for dizziness, seizures, speech difficulty, weakness and light-headedness.  Hematological: Negative for adenopathy. Does not bruise/bleed easily.    Past Medical History:  Diagnosis Date  . Anemia   . Anxiety   . Depression   . Eating disorder    anorexia nervosa in her 20's  .  Fainting spell   . Hx of laminectomy 1994   L4-5  . Kidney stones    passed on their own  . Migraine   . Stomach disorder    due to complications with cholecystectomy "almost died"     Social History   Socioeconomic History  . Marital status: Married    Spouse name: Not on file  . Number of children: 3  . Years of education: Not on file  . Highest education level: Not on file  Occupational History    Employer: SELF EMPLOYED  Social Needs  . Financial resource strain: Not on file  . Food insecurity:    Worry: Not on file    Inability: Not on file  . Transportation needs:    Medical: Not on file    Non-medical: Not on file  Tobacco Use  . Smoking status: Never Smoker  . Smokeless tobacco: Never Used  Substance and Sexual Activity  . Alcohol use: No  . Drug use: Yes  . Sexual activity: Yes    Partners: Male    Birth control/protection: Surgical  Lifestyle  . Physical activity:    Days per week: Not on file    Minutes per session: Not on file  . Stress: Not on file  Relationships  . Social connections:    Talks on phone: Not on file    Gets together: Not on file    Attends religious service: Not on file    Active member of  club or organization: Not on file    Attends meetings of clubs or organizations: Not on file    Relationship status: Not on file  . Intimate partner violence:    Fear of current or ex partner: Not on file    Emotionally abused: Not on file    Physically abused: Not on file    Forced sexual activity: Not on file  Other Topics Concern  . Not on file  Social History Narrative   Regular exercise:  Yes (swim instructor)   Caffeine Use:  1 daily   Married 3 children ages 47 son, 23 son, and 39 daughter   Associates degree                Past Surgical History:  Procedure Laterality Date  . APPENDECTOMY  1976  . BREAST BIOPSY Right   . CHOLECYSTECTOMY  08/2009   reports history of gallbladder polyps, she reports stents in duct of lushca     . HYSTEROSCOPY  07/2011  . LAMINECTOMY  1994   L4-5  . TONSILLECTOMY  1982  . TUBAL LIGATION      Family History  Problem Relation Age of Onset  . Hypertension Mother   . Diabetes Father   . Hypertension Father   . Hypothyroidism Father   . CAD Father   . Alcohol abuse Maternal Grandmother   . Cancer Paternal Grandfather        lung cancer  . Hypothyroidism Other   . Hashimoto's thyroiditis Sister   . Hypothyroidism Sister   . Neuropathy Sister        chronic inflammatory demyelinating peripheral neuropathy from lyme disease    Allergies  Allergen Reactions  . Buprenorphine Hcl     Other reaction(s): Other (See Comments) No relief  . Buspar [Buspirone]     constipation  . Eszopiclone     Headaches  . Flexeril [Cyclobenzaprine Hcl] Other (See Comments)    Mood swings  . Gabapentin     sedation  . Morphine And Related Other (See Comments)    No relief  . Nsaids Other (See Comments)    GI bleed  . Paroxetine Nausea Only       . Shellfish Allergy Other (See Comments)    skin & mouth tingle  . Tizanidine Hcl Other (See Comments)    Dizziness, mood swings  . Tolmetin     Other reaction(s): Other (See Comments) GI bleed    Current Outpatient Medications on File Prior to Visit  Medication Sig Dispense Refill  . buPROPion (WELLBUTRIN XL) 150 MG 24 hr tablet Take 1 tablet (150 mg total) by mouth daily. 30 tablet 5  . clonazePAM (KLONOPIN) 0.5 MG tablet TAKE ONE TABLET BY MOUTH THREE TIMES A DAY AS NEEDED FOR ANXIETY 80 tablet 0   No current facility-administered medications on file prior to visit.     BP 100/77   Pulse (!) 58   Temp 97.9 F (36.6 C) (Oral)   Resp 16   Ht 5\' 6"  (1.676 m)   Wt 124 lb 3.2 oz (56.3 kg)   SpO2 100%   BMI 20.05 kg/m       Objective:   Physical Exam  General Mental Status- Alert. General Appearance- Not in acute distress.   Heent- negative no sinus pressure. Ears clear tm normal.  Skin General: Color- Normal Color.  Moisture- Normal Moisture.  Neck Carotid Arteries- Normal color. Moisture- Normal Moisture. No carotid bruits. No JVD.  Chest and Lung  Exam Auscultation: Breath Sounds:-Normal.  Cardiovascular Auscultation:Rythm- Regular. Murmurs & Other Heart Sounds:Auscultation of the heart reveals- No Murmurs.  Abdomen Inspection:-Inspeection Normal. Palpation/Percussion:Note:No mass. Palpation and Percussion of the abdomen reveal- Non Tender, Non Distended + BS, no rebound or guarding.    Neurologic Cranial Nerve exam:- CN III-XII intact(No nystagmus), symmetric smile. Drift Test:- No drift. Romberg Exam:- Negative.  Heal to Toe Gait exam:-Normal. Finger to Nose:- Normal/Intact Strength:- 5/5 equal and symmetric strength both upper and lower extremities.      Assessment & Plan:  You do have history of migraine type headaches and some features presently on your description.  Symptoms have waxed and waned for 2 weeks and brief episodes of relief when in dark room and is sleeping.  Historically you report responding well to Toradol so we will give that today at 60 mg IM dose and give Phenergan 25 mg IM.  Your husband drove you here and recommend that he drive you home as well due to medication side effects.  Hopefully this will stop your headache.  If no impact on your headache/persisting signs and symptoms then recommend ED evaluation.  Your report responding well to probable Imitrex years ago.  I am going to prescribe that again for early onset migraine type headaches.  Recent CT in January was negative.  I do think we need to follow you closely and if your headache features are worsening then you might need MRI or referral to neurologist.  If you get a headache with gross motor/sensory function deficits then recommend being seen in the emergency department as well.  Recommend follow-up in 7 to 10 days with PCP or myself.  Also follow-up as needed.  We need to assess your response to  the above treatments and make decision if further medications needed, imaging needed or possible referral to neurologist.  Mackie Pai, PA-C

## 2017-11-09 ENCOUNTER — Telehealth: Payer: Self-pay | Admitting: Family

## 2017-11-09 MED ORDER — CLONAZEPAM 0.5 MG PO TABS: ORAL_TABLET | ORAL | 0 refills | Status: DC

## 2017-11-09 NOTE — Telephone Encounter (Signed)
Last clonazepam RX: 09/29/17, #80 Last OV: 09/02/17 Next OV: 12/26/17 UDS: 09/02/17, moderate CSC: 03/10/17 CSR: No discrepancies identified

## 2017-11-09 NOTE — Telephone Encounter (Signed)
Copied from [place] [phone] [id number]. Topic: Quick Communication - Rx Refill/Question >> Nov 09, 2017  9:15 AM Yvette Rack wrote: Medication: clonazePAM (KLONOPIN) 0.5 MG tablet  Has the patient contacted their pharmacy? Yes.  Requested on 11-01-17 (Agent: If no, request that the patient contact the pharmacy for the refill.) (Agent: If yes, when and what did the pharmacy advise?) 11-01-17 they state that they will send a request over to provider that day  Preferred Pharmacy (with phone number or street name): [place], [address] [place]. [phone] (Phone) [phone] (Fax)      Agent: Please be advised that RX refills may take up to 3 business days. We ask that you follow-up with your pharmacy.

## 2017-11-09 NOTE — Telephone Encounter (Signed)
Clonazepam refill Last Refill: 09/29/17 # 80 Last OV: 09/02/17 PCP: [name] Pharmacy:[facility]  [place]

## 2017-11-14 ENCOUNTER — Ambulatory Visit (INDEPENDENT_AMBULATORY_CARE_PROVIDER_SITE_OTHER): Payer: 59 | Admitting: Family

## 2017-11-14 ENCOUNTER — Encounter: Payer: Self-pay | Admitting: Family

## 2017-11-14 VITALS — BP 116/72 | HR 61 | Temp 98.4°F | Resp 16 | Ht 66.0 in | Wt 124.2 lb

## 2017-11-14 DIAGNOSIS — F32A Depression, unspecified: Secondary | ICD-10-CM

## 2017-11-14 DIAGNOSIS — G43909 Migraine, unspecified, not intractable, without status migrainosus: Secondary | ICD-10-CM

## 2017-11-14 DIAGNOSIS — F419 Anxiety disorder, unspecified: Secondary | ICD-10-CM | POA: Diagnosis not present

## 2017-11-14 DIAGNOSIS — F329 Major depressive disorder, single episode, unspecified: Secondary | ICD-10-CM | POA: Diagnosis not present

## 2017-11-14 DIAGNOSIS — J019 Acute sinusitis, unspecified: Secondary | ICD-10-CM | POA: Diagnosis not present

## 2017-11-14 MED ORDER — AMOXICILLIN-POT CLAVULANATE 875-125 MG PO TABS: 1.0000 | ORAL_TABLET | Freq: Two times a day (BID) | ORAL | 0 refills | Status: DC

## 2017-11-14 MED ORDER — SUMATRIPTAN SUCCINATE 50 MG PO TABS: 50.0000 mg | ORAL_TABLET | ORAL | 5 refills | Status: DC | PRN

## 2017-11-14 MED ORDER — BUPROPION HCL ER (XL) 300 MG PO TB24: 300.0000 mg | ORAL_TABLET | Freq: Every day | ORAL | 1 refills | Status: DC

## 2017-11-14 NOTE — Progress Notes (Signed)
Subjective:    Patient ID: Shannon [name], female    DOB: 06-15-1960, 57 y.o.   MRN: [id number]  HPI   Shannon [name] is a 57 yr old female who presents today with two concerns.  1) Sinus congestion- reports 2 week hx of nasal drainage, facial pressure. Reports subjective temp 1 week ago.   2) Depression- continues wellbutrin. Feels that her depression symptoms have worsened.  Requests increase in her wellbutrin dose. She is seen a therapist regularly. Her son's mental health continues to be of great concern to her.  Her daughter is doing quite well and she is happy about this but she as moved out of the home and gotten her own place so she feel a bit sad that she is not around. 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  Notes that she has been sleeping more. Denies SI.  Stopped tramadol, wants to try to get off of klonopin. She is taking 1 klonopin a day instead of 2 or 3 a day.    Wt Readings from Last 3 Encounters:  11/14/17 124 lb 3.2 oz (56.3 kg)  10/21/17 124 lb 3.2 oz (56.3 kg)  09/20/17 126 lb (57.2 kg)   3)Migraines- reports that she has not had a migraine since her 6/19 visit.     Review of Systems    see HPI  Past Medical History:  Diagnosis Date  . Anemia   . Anxiety   . Depression   . Eating disorder    anorexia nervosa in her 20's  . Fainting spell   . Hx of laminectomy 1994   L4-5  . Kidney stones    passed on their own  . Migraine   . Stomach disorder    due to complications with cholecystectomy "almost died"     Social History   Socioeconomic History  . Marital status: Married    Spouse name: Not on file  . Number of children: 3  . Years of education: Not on file  . Highest education level: Not on file  Occupational History    Employer: SELF EMPLOYED  Social Needs  . Financial resource strain: Not on file  . Food insecurity:    Worry: Not on file    Inability: Not on file  .  Transportation needs:    Medical: Not on file    Non-medical: Not on file  Tobacco Use  . Smoking status: Never Smoker  . Smokeless tobacco: Never Used  Substance and Sexual Activity  . Alcohol use: No  . Drug use: Yes  . Sexual activity: Yes    Partners: Male    Birth control/protection: Surgical  Lifestyle  . Physical activity:    Days per week: Not on file    Minutes per session: Not on file  . Stress: Not on file  Relationships  . Social connections:    Talks on phone: Not on file    Gets together: Not on file    Attends religious service: Not on file    Active member of club or organization: Not on file    Attends meetings of clubs or organizations: Not on file    Relationship status: Not on file  . Intimate partner violence:    Fear of current or ex partner: Not on file    Emotionally abused: Not on file    Physically abused: Not on file    Forced sexual activity:  Not on file  Other Topics Concern  . Not on file  Social History Narrative   Regular exercise:  Yes (swim instructor)   Caffeine Use:  1 daily   Married 3 children ages 7 son, 76 son, and 6 daughter   Associates degree                Past Surgical History:  Procedure Laterality Date  . APPENDECTOMY  1976  . BREAST BIOPSY Right   . CHOLECYSTECTOMY  08/2009   reports history of gallbladder polyps, she reports stents in duct of lushca   . HYSTEROSCOPY  07/2011  . LAMINECTOMY  1994   L4-5  . TONSILLECTOMY  1982  . TUBAL LIGATION      Family History  Problem Relation Age of Onset  . Hypertension Mother   . Diabetes Father   . Hypertension Father   . Hypothyroidism Father   . CAD Father   . Alcohol abuse Maternal Grandmother   . Cancer Paternal Grandfather        lung cancer  . Hypothyroidism Other   . Hashimoto's thyroiditis Sister   . Hypothyroidism Sister   . Neuropathy Sister        chronic inflammatory demyelinating peripheral neuropathy from lyme disease    Allergies  Allergen  Reactions  . Buprenorphine Hcl     Other reaction(s): Other (See Comments) No relief  . Buspar [Buspirone]     constipation  . Eszopiclone     Headaches  . Flexeril [Cyclobenzaprine Hcl] Other (See Comments)    Mood swings  . Gabapentin     sedation  . Morphine And Related Other (See Comments)    No relief  . Nsaids Other (See Comments)    GI bleed  . Paroxetine Nausea Only       . Shellfish Allergy Other (See Comments)    skin & mouth tingle  . Tizanidine Hcl Other (See Comments)    Dizziness, mood swings  . Tolmetin     Other reaction(s): Other (See Comments) GI bleed    Current Outpatient Medications on File Prior to Visit  Medication Sig Dispense Refill  . clonazePAM (KLONOPIN) 0.5 MG tablet TAKE ONE TABLET BY MOUTH THREE TIMES A DAY AS NEEDED FOR ANXIETY 80 tablet 0   No current facility-administered medications on file prior to visit.     BP 116/72 (BP Location: Right Arm, Cuff Size: Normal)   Pulse 61   Temp 98.4 F (36.9 C) (Oral)   Resp 16   Ht 5\' 6"  (1.676 m)   Wt 124 lb 3.2 oz (56.3 kg)   SpO2 100%   BMI 20.05 kg/m    Objective:   Physical Exam  Constitutional: She appears well-developed and well-nourished.  HENT:  Head: Normocephalic and atraumatic.  Right Ear: Tympanic membrane and ear canal normal.  Left Ear: Tympanic membrane normal.  Nose: Right sinus exhibits maxillary sinus tenderness and frontal sinus tenderness. Left sinus exhibits no maxillary sinus tenderness and no frontal sinus tenderness.  Mouth/Throat: Oropharynx is clear and moist. No oropharyngeal exudate, posterior oropharyngeal edema or posterior oropharyngeal erythema.  Cardiovascular: Normal rate, regular rhythm and normal heart sounds.  No murmur heard. Pulmonary/Chest: Effort normal and breath sounds normal. No respiratory distress. She has no wheezes.  Psychiatric: She has a normal mood and affect. Her behavior is normal. Judgment and thought content normal.            Assessment & Plan:  Sinusitis- new. Rx with  augmentin.   Depression- uncontrolled. Advised pt to increase wellbutrin to 300mg  xl daily. Continue work with psychologist.  Anxiety- well controlled on klonopin 1 tab once daily. She is interested in discontinuing. Advised her to continue 1 tab once daily for few weeks, then can decrease to 1/2 daily for a few weeks before stopping.   Migraines- stable. Continue prn imitrex.

## 2017-11-14 NOTE — Patient Instructions (Signed)
Please increase wellbutrin to 300mg  once daily. Begin augmentin for your sinus infection. Call if symptoms worsen or fail to improve.

## 2017-12-12 ENCOUNTER — Other Ambulatory Visit: Payer: Self-pay

## 2017-12-12 MED ORDER — CLONAZEPAM 0.5 MG PO TABS: ORAL_TABLET | ORAL | 0 refills | Status: DC

## 2017-12-12 NOTE — Telephone Encounter (Signed)
Received refill request for clonazePAM (KLONOPIN) 0.5 MG tablet.  Last OV: 11/14/17 Last Refill: 11/09/17  09-02-17 UDS sample given. Moderate risk.  Please advise

## 2017-12-12 NOTE — Telephone Encounter (Signed)
Request sent to provider for approval.  

## 2017-12-12 NOTE — Telephone Encounter (Signed)
Pt calling back about medicine not being at pharmacy please call back at [phone]

## 2017-12-13 ENCOUNTER — Other Ambulatory Visit: Payer: Self-pay

## 2017-12-13 NOTE — Telephone Encounter (Signed)
Received fax from pharmacy requesing refill on her Clonazepam. Request was also sent in electronically. Refill addressed yesterday and sent in to pharmacy for patient.

## 2017-12-25 NOTE — Progress Notes (Signed)
Subjective:    Patient ID: Shannon [name], female    DOB: 08-30-1960, 57 y.o.   MRN: [id number]  HPI   Patient is a 57 yr old female who presents today for follow up.   Depression- last visit she noted worsening depression symptoms.  We increased her wellbutrin dosing. She reports feeling more even on this dosing. No longer having daily tearfulness. Continues   Migraines- maintained on imitrex. Reports that she had a migraine over the weekend.  Has used imitrex prn for the last few days.  Mild HA this AM.    Review of Systems    see HPI  Past Medical History:  Diagnosis Date  . Anemia   . Anxiety   . Depression   . Eating disorder    anorexia nervosa in her 20's  . Fainting spell   . Hx of laminectomy 1994   L4-5  . Kidney stones    passed on their own  . Migraine   . Stomach disorder    due to complications with cholecystectomy "almost died"     Social History   Socioeconomic History  . Marital status: Married    Spouse name: Not on file  . Number of children: 3  . Years of education: Not on file  . Highest education level: Not on file  Occupational History    Employer: SELF EMPLOYED  Social Needs  . Financial resource strain: Not on file  . Food insecurity:    Worry: Not on file    Inability: Not on file  . Transportation needs:    Medical: Not on file    Non-medical: Not on file  Tobacco Use  . Smoking status: Never Smoker  . Smokeless tobacco: Never Used  Substance and Sexual Activity  . Alcohol use: No  . Drug use: Yes  . Sexual activity: Yes    Partners: Male    Birth control/protection: Surgical  Lifestyle  . Physical activity:    Days per week: Not on file    Minutes per session: Not on file  . Stress: Not on file  Relationships  . Social connections:    Talks on phone: Not on file    Gets together: Not on file    Attends religious service: Not on file    Active member of club or organization: Not on file    Attends meetings of clubs  or organizations: Not on file    Relationship status: Not on file  . Intimate partner violence:    Fear of current or ex partner: Not on file    Emotionally abused: Not on file    Physically abused: Not on file    Forced sexual activity: Not on file  Other Topics Concern  . Not on file  Social History Narrative   Regular exercise:  Yes (swim instructor)   Caffeine Use:  1 daily   Married 3 children ages 15 son, 36 son, and 78 daughter   Associates degree                Past Surgical History:  Procedure Laterality Date  . APPENDECTOMY  1976  . BREAST BIOPSY Right   . CHOLECYSTECTOMY  08/2009   reports history of gallbladder polyps, she reports stents in duct of lushca   . HYSTEROSCOPY  07/2011  . LAMINECTOMY  1994   L4-5  . TONSILLECTOMY  1982  . TUBAL LIGATION      Family History  Problem Relation Age of Onset  .  Hypertension Mother   . Diabetes Father   . Hypertension Father   . Hypothyroidism Father   . CAD Father   . Alcohol abuse Maternal Grandmother   . Cancer Paternal Grandfather        lung cancer  . Hypothyroidism Other   . Hashimoto's thyroiditis Sister   . Hypothyroidism Sister   . Neuropathy Sister        chronic inflammatory demyelinating peripheral neuropathy from lyme disease    Allergies  Allergen Reactions  . Buprenorphine Hcl     Other reaction(s): Other (See Comments) No relief  . Buspar [Buspirone]     constipation  . Eszopiclone     Headaches  . Flexeril [Cyclobenzaprine Hcl] Other (See Comments)    Mood swings  . Gabapentin     sedation  . Morphine And Related Other (See Comments)    No relief  . Nsaids Other (See Comments)    GI bleed  . Paroxetine Nausea Only       . Shellfish Allergy Other (See Comments)    skin & mouth tingle  . Tizanidine Hcl Other (See Comments)    Dizziness, mood swings  . Tolmetin     Other reaction(s): Other (See Comments) GI bleed    Current Outpatient Medications on File Prior to Visit    Medication Sig Dispense Refill  . clonazePAM (KLONOPIN) 0.5 MG tablet TAKE ONE TABLET BY MOUTH THREE TIMES A DAY AS NEEDED FOR ANXIETY 80 tablet 0  . ondansetron (ZOFRAN-ODT) 4 MG disintegrating tablet Take 1 tablet every 8 hours as needed for migraine    . SUMAtriptan (IMITREX) 50 MG tablet Take 1 tablet (50 mg total) by mouth every 2 (two) hours as needed for migraine. May repeat in 2 hours if headache persists or recurs.(max 2 tabs in any 24 hours) 10 tablet 5   No current facility-administered medications on file prior to visit.     BP (!) 96/57 (BP Location: Right Arm, Cuff Size: Normal)   Pulse (!) 55   Temp 97.8 F (36.6 C) (Oral)   Resp 16   Ht 5\' 6"  (1.676 m)   Wt 124 lb 9.6 oz (56.5 kg)   SpO2 100%   BMI 20.11 kg/m    Objective:   Physical Exam  Constitutional: She is oriented to person, place, and time. She appears well-developed and well-nourished.  Cardiovascular: Normal rate, regular rhythm and normal heart sounds.  No murmur heard. Pulmonary/Chest: Effort normal and breath sounds normal. No respiratory distress. She has no wheezes.  Neurological: She is alert and oriented to person, place, and time.  Skin: Skin is warm.  Psychiatric: She has a normal mood and affect. Her behavior is normal. Judgment and thought content normal.          Assessment & Plan:  Migraines-uncontrolled with recent flare, IM toradol today.  Continue prn imitrex. If frequency of migraines worsens could consider addition of daily preventative medication.  Depression/anxiety- improved on increased dose of wellbutrin. Continue same. Obtain follow up UDS today. Continue prn klonopin.

## 2017-12-26 ENCOUNTER — Ambulatory Visit: Payer: 59 | Admitting: Family

## 2017-12-26 ENCOUNTER — Encounter: Payer: Self-pay | Admitting: Family

## 2017-12-26 VITALS — BP 96/57 | HR 55 | Temp 97.8°F | Resp 16 | Ht 66.0 in | Wt 124.6 lb

## 2017-12-26 DIAGNOSIS — G43909 Migraine, unspecified, not intractable, without status migrainosus: Secondary | ICD-10-CM | POA: Diagnosis not present

## 2017-12-26 DIAGNOSIS — Z79899 Other long term (current) drug therapy: Secondary | ICD-10-CM

## 2017-12-26 DIAGNOSIS — Z8371 Family history of colonic polyps: Secondary | ICD-10-CM | POA: Diagnosis not present

## 2017-12-26 DIAGNOSIS — K5900 Constipation, unspecified: Secondary | ICD-10-CM | POA: Diagnosis not present

## 2017-12-26 DIAGNOSIS — Z83719 Family history of colon polyps, unspecified: Secondary | ICD-10-CM

## 2017-12-26 DIAGNOSIS — R1310 Dysphagia, unspecified: Secondary | ICD-10-CM | POA: Diagnosis not present

## 2017-12-26 DIAGNOSIS — F418 Other specified anxiety disorders: Secondary | ICD-10-CM | POA: Diagnosis not present

## 2017-12-26 HISTORY — DX: Family history of colon polyps, unspecified: Z83.719

## 2017-12-26 MED ORDER — BUPROPION HCL ER (XL) 300 MG PO TB24: 300.0000 mg | ORAL_TABLET | Freq: Every day | ORAL | 1 refills | Status: DC

## 2017-12-26 MED ORDER — KETOROLAC TROMETHAMINE 60 MG/2ML IM SOLN
60.0000 mg | Freq: Once | INTRAMUSCULAR | Status: AC
  Administered 2017-12-26: 60 mg via INTRAMUSCULAR

## 2017-12-26 NOTE — Patient Instructions (Addendum)
Please complete lab work prior to leaving. Continue current medications.  Let me know if the frequency of your migraines worsens or fails to improve.

## 2017-12-27 ENCOUNTER — Telehealth: Payer: Self-pay | Admitting: Family

## 2017-12-27 LAB — PAIN MGMT, PROFILE 8 W/CONF, U
6 ACETYLMORPHINE: NEGATIVE ng/mL (ref <10)
ALCOHOL METABOLITES: NEGATIVE ng/mL (ref <500)
AMPHETAMINES: NEGATIVE ng/mL (ref <500)
Benzodiazepines: NEGATIVE ng/mL (ref <100)
Buprenorphine, Urine: NEGATIVE ng/mL (ref <5)
COCAINE METABOLITE: NEGATIVE ng/mL (ref <150)
CREATININE: 56.3 mg/dL
MARIJUANA METABOLITE: NEGATIVE ng/mL (ref <20)
MDMA: NEGATIVE ng/mL (ref <500)
OPIATES: NEGATIVE ng/mL (ref <100)
Oxidant: NEGATIVE ug/mL (ref <200)
Oxycodone: NEGATIVE ng/mL (ref <100)
PH: 6.77 (ref 4.5–9.0)

## 2017-12-27 NOTE — Telephone Encounter (Signed)
I would recommend that she pre-medicate with a tablet of imitrex. She can ask GI how late she is allowed to take pills prior to her procedure. She can also discuss with anesthesiologist as well prior to colonoscopy as they may be able to give her dose of IV toradol.

## 2017-12-27 NOTE — Telephone Encounter (Signed)
Copied from [place]. Topic: Quick Communication - See Telephone Encounter >> Dec 27, 2017 12:43 PM Sheran Luz wrote: CRM for notification. See Telephone encounter for: 12/27/17.  Pt called stating that she is scheduled for a colonoscopy on Sep 19 and being given propofol beforehand. Pt states that propofol gives her a migraine and was inquiring if her PCP would advise a shot of Toradol that morning to prevent a migraine from the Propofol. Pt is requesting a call back from RN Larena Glassman if possible.   EL#[phone]

## 2017-12-27 NOTE — Telephone Encounter (Signed)
Notified pt and she voices understanding. She will contact the GI office.

## 2018-01-06 ENCOUNTER — Telehealth: Payer: Self-pay | Admitting: Family

## 2018-01-06 MED ORDER — CLONAZEPAM 0.5 MG PO TABS: ORAL_TABLET | ORAL | 1 refills | Status: DC

## 2018-01-06 NOTE — Telephone Encounter (Signed)
Copied from [place] [id number]. Topic: General - Other >> Jan 06, 2018  2:47 PM Lennox Solders wrote: Reason for CRM:pt is calling and needs a refill on clonazepam .[facility] in [place] [address]. Pt has called her phamacist

## 2018-01-06 NOTE — Telephone Encounter (Signed)
Last RX: 812/19, #80 x no refills Last OV: 12/26/17 Next OV: not scheduled UDS: 12/26/17, moderate risk CSC: 03/08/17 CSR: No discrepancies identified

## 2018-01-12 DIAGNOSIS — M79645 Pain in left finger(s): Secondary | ICD-10-CM | POA: Diagnosis not present

## 2018-01-12 DIAGNOSIS — M79644 Pain in right finger(s): Secondary | ICD-10-CM | POA: Diagnosis not present

## 2018-01-12 DIAGNOSIS — M25531 Pain in right wrist: Secondary | ICD-10-CM | POA: Insufficient documentation

## 2018-01-12 DIAGNOSIS — M19039 Primary osteoarthritis, unspecified wrist: Secondary | ICD-10-CM | POA: Diagnosis not present

## 2018-01-12 HISTORY — DX: Pain in right finger(s): M79.644

## 2018-01-12 HISTORY — DX: Pain in right finger(s): M79.645

## 2018-01-12 HISTORY — DX: Pain in right wrist: M25.531

## 2018-01-19 DIAGNOSIS — Z8371 Family history of colonic polyps: Secondary | ICD-10-CM | POA: Diagnosis not present

## 2018-01-19 DIAGNOSIS — K229 Disease of esophagus, unspecified: Secondary | ICD-10-CM | POA: Diagnosis not present

## 2018-01-19 DIAGNOSIS — K648 Other hemorrhoids: Secondary | ICD-10-CM | POA: Diagnosis not present

## 2018-01-19 DIAGNOSIS — R1314 Dysphagia, pharyngoesophageal phase: Secondary | ICD-10-CM | POA: Diagnosis not present

## 2018-01-19 DIAGNOSIS — R1310 Dysphagia, unspecified: Secondary | ICD-10-CM | POA: Diagnosis not present

## 2018-01-19 DIAGNOSIS — Z1211 Encounter for screening for malignant neoplasm of colon: Secondary | ICD-10-CM | POA: Diagnosis not present

## 2018-01-19 DIAGNOSIS — K649 Unspecified hemorrhoids: Secondary | ICD-10-CM | POA: Diagnosis not present

## 2018-01-23 DIAGNOSIS — Z872 Personal history of diseases of the skin and subcutaneous tissue: Secondary | ICD-10-CM | POA: Diagnosis not present

## 2018-01-23 DIAGNOSIS — D225 Melanocytic nevi of trunk: Secondary | ICD-10-CM | POA: Diagnosis not present

## 2018-01-23 DIAGNOSIS — D2271 Melanocytic nevi of right lower limb, including hip: Secondary | ICD-10-CM | POA: Diagnosis not present

## 2018-01-23 DIAGNOSIS — L570 Actinic keratosis: Secondary | ICD-10-CM | POA: Diagnosis not present

## 2018-02-02 DIAGNOSIS — Z01419 Encounter for gynecological examination (general) (routine) without abnormal findings: Secondary | ICD-10-CM | POA: Diagnosis not present

## 2018-02-02 DIAGNOSIS — Z681 Body mass index (BMI) 19 or less, adult: Secondary | ICD-10-CM | POA: Diagnosis not present

## 2018-02-02 LAB — HM PAP SMEAR: HM Pap smear: NEGATIVE

## 2018-03-10 ENCOUNTER — Other Ambulatory Visit: Payer: Self-pay | Admitting: Family

## 2018-03-27 ENCOUNTER — Encounter: Payer: Self-pay | Admitting: Family

## 2018-03-27 ENCOUNTER — Ambulatory Visit: Payer: 59 | Admitting: Family

## 2018-03-27 VITALS — BP 118/77 | HR 71 | Temp 97.8°F | Resp 16 | Ht 66.0 in | Wt 122.8 lb

## 2018-03-27 DIAGNOSIS — M542 Cervicalgia: Secondary | ICD-10-CM

## 2018-03-27 DIAGNOSIS — G43909 Migraine, unspecified, not intractable, without status migrainosus: Secondary | ICD-10-CM | POA: Diagnosis not present

## 2018-03-27 MED ORDER — KETOROLAC TROMETHAMINE 60 MG/2ML IM SOLN
60.0000 mg | Freq: Once | INTRAMUSCULAR | Status: AC
  Administered 2018-03-27: 60 mg via INTRAMUSCULAR

## 2018-03-27 MED ORDER — ONDANSETRON HCL 4 MG/2ML IJ SOLN
4.0000 mg | Freq: Once | INTRAMUSCULAR | Status: AC
Start: 2018-03-27 — End: 2018-03-27
  Administered 2018-03-27: 4 mg via INTRAMUSCULAR

## 2018-03-27 NOTE — Progress Notes (Signed)
Subjective:    Patient ID: Shannon [name], female    DOB: 09/10/1960, 57 y.o.   MRN: [id number]  HPI  Ms. Shannon [name] presents today with chief complaint of migraine.   Note that her migraines are becoming more frequent.  Notes that stress and strain in her upper shoulders/neck.  Usually migraines help. Reports that dry needling helped with the tightness at the base of her neck.    +photophobia/phonophobia.  + nausea. 7-8/10. Reports that she took imitrex last night (HA was 9 last night) took another one this AM. Helped a little. Has taken zofran this AM. Helped a little. Had some dizziness and wavy vision this AM.  + Tinnitus   Wt Readings from Last 3 Encounters:  03/27/18 122 lb 12.8 oz (55.7 kg)  12/26/17 124 lb 9.6 oz (56.5 kg)  11/14/17 124 lb 3.2 oz (56.3 kg)     Review of Systems See HPI  Past Medical History:  Diagnosis Date  . Anemia   . Anxiety   . Depression   . Eating disorder    anorexia nervosa in her 20's  . Fainting spell   . Hx of laminectomy 1994   L4-5  . Kidney stones    passed on their own  . Migraine   . Stomach disorder    due to complications with cholecystectomy "almost died"     Social History   Socioeconomic History  . Marital status: Married    Spouse name: Not on file  . Number of children: 3  . Years of education: Not on file  . Highest education level: Not on file  Occupational History    Employer: SELF EMPLOYED  Social Needs  . Financial resource strain: Not on file  . Food insecurity:    Worry: Not on file    Inability: Not on file  . Transportation needs:    Medical: Not on file    Non-medical: Not on file  Tobacco Use  . Smoking status: Never Smoker  . Smokeless tobacco: Never Used  Substance and Sexual Activity  . Alcohol use: No  . Drug use: Yes  . Sexual activity: Yes    Partners: Male    Birth control/protection: Surgical  Lifestyle  . Physical activity:    Days per week: Not on file    Minutes per session:  Not on file  . Stress: Not on file  Relationships  . Social connections:    Talks on phone: Not on file    Gets together: Not on file    Attends religious service: Not on file    Active member of club or organization: Not on file    Attends meetings of clubs or organizations: Not on file    Relationship status: Not on file  . Intimate partner violence:    Fear of current or ex partner: Not on file    Emotionally abused: Not on file    Physically abused: Not on file    Forced sexual activity: Not on file  Other Topics Concern  . Not on file  Social History Narrative   Regular exercise:  Yes (swim instructor)   Caffeine Use:  1 daily   Married 3 children ages 20 son, 4 son, and 22 daughter   Associates degree                Past Surgical History:  Procedure Laterality Date  . APPENDECTOMY  1976  . BREAST BIOPSY Right   . CHOLECYSTECTOMY  08/2009  reports history of gallbladder polyps, she reports stents in duct of lushca   . HYSTEROSCOPY  07/2011  . LAMINECTOMY  1994   L4-5  . TONSILLECTOMY  1982  . TUBAL LIGATION      Family History  Problem Relation Age of Onset  . Hypertension Mother   . Diabetes Father   . Hypertension Father   . Hypothyroidism Father   . CAD Father   . Alcohol abuse Maternal Grandmother   . Cancer Paternal Grandfather        lung cancer  . Hypothyroidism Other   . Hashimoto's thyroiditis Sister   . Hypothyroidism Sister   . Neuropathy Sister        chronic inflammatory demyelinating peripheral neuropathy from lyme disease    Allergies  Allergen Reactions  . Buprenorphine Hcl     Other reaction(s): Other (See Comments) No relief  . Buspar [Buspirone]     constipation  . Eszopiclone     Headaches  . Flexeril [Cyclobenzaprine Hcl] Other (See Comments)    Mood swings  . Gabapentin     sedation  . Morphine And Related Other (See Comments)    No relief  . Nsaids Other (See Comments)    GI bleed  . Paroxetine Nausea Only         . Shellfish Allergy Other (See Comments)    skin & mouth tingle  . Tizanidine Hcl Other (See Comments)    Dizziness, mood swings  . Tolmetin     Other reaction(s): Other (See Comments) GI bleed    Current Outpatient Medications on File Prior to Visit  Medication Sig Dispense Refill  . buPROPion (WELLBUTRIN XL) 300 MG 24 hr tablet Take 1 tablet (300 mg total) by mouth daily. 90 tablet 1  . clonazePAM (KLONOPIN) 0.5 MG tablet TAKE ONE TABLET BY MOUTH THREE TIMES A DAY AS NEEDED FOR ANXIETY 80 tablet 0  . ondansetron (ZOFRAN-ODT) 4 MG disintegrating tablet Take 1 tablet every 8 hours as needed for migraine    . SUMAtriptan (IMITREX) 50 MG tablet Take 1 tablet (50 mg total) by mouth every 2 (two) hours as needed for migraine. May repeat in 2 hours if headache persists or recurs.(max 2 tabs in any 24 hours) 10 tablet 5   No current facility-administered medications on file prior to visit.     BP 118/77 (BP Location: Right Arm, Patient Position: Sitting, Cuff Size: Small)   Pulse 71   Temp 97.8 F (36.6 C) (Oral)   Resp 16   Ht 5\' 6"  (1.676 m)   Wt 122 lb 12.8 oz (55.7 kg)   SpO2 100%   BMI 19.82 kg/m       Objective:   Physical Exam  Constitutional: She is oriented to person, place, and time. She appears well-developed and well-nourished.  Laying on exam table, wearing sunglasses  Cardiovascular: Normal rate, regular rhythm and normal heart sounds.  No murmur heard. Pulmonary/Chest: Effort normal and breath sounds normal. No respiratory distress. She has no wheezes.  Neurological: She is alert and oriented to person, place, and time. She exhibits normal muscle tone.  PERRLA, EOM intact  Psychiatric: She has a normal mood and affect. Her behavior is normal. Judgment and thought content normal.          Assessment & Plan:  Migraine-uncontrolled.  rx with toradol, IM zofran.  Refer back to PT for physical therapy/dry needling which helped her previously. Discussed referral  to neurology. She declines at this time.

## 2018-03-27 NOTE — Addendum Note (Signed)
Addended by: Harl Bowie on: 03/27/2018 03:34 PM   Modules accepted: Orders

## 2018-04-07 ENCOUNTER — Other Ambulatory Visit: Payer: Self-pay | Admitting: Family

## 2018-04-07 NOTE — Telephone Encounter (Signed)
Last clonazepam RX: 03/11/18, #80 Last OV: 03/27/18, acute Next OV: none scheduled UDS: 12/26/17 CSC: 03/08/17, past due CSR: No discrepancies identified

## 2018-05-05 ENCOUNTER — Other Ambulatory Visit: Payer: Self-pay | Admitting: Family

## 2018-06-06 ENCOUNTER — Other Ambulatory Visit: Payer: Self-pay | Admitting: Obstetrics and Gynecology

## 2018-06-06 DIAGNOSIS — Z1231 Encounter for screening mammogram for malignant neoplasm of breast: Secondary | ICD-10-CM

## 2018-06-09 ENCOUNTER — Other Ambulatory Visit: Payer: Self-pay | Admitting: Family

## 2018-07-05 ENCOUNTER — Ambulatory Visit: Payer: Self-pay

## 2018-07-09 ENCOUNTER — Other Ambulatory Visit: Payer: Self-pay | Admitting: Family

## 2018-08-06 ENCOUNTER — Other Ambulatory Visit: Payer: Self-pay | Admitting: Family

## 2018-08-08 ENCOUNTER — Other Ambulatory Visit: Payer: Self-pay | Admitting: Family

## 2018-08-08 ENCOUNTER — Telehealth: Payer: Self-pay | Admitting: Family

## 2018-08-08 MED ORDER — BUPROPION HCL ER (XL) 300 MG PO TB24: 300.0000 mg | ORAL_TABLET | Freq: Every day | ORAL | 1 refills | Status: DC

## 2018-08-08 NOTE — Telephone Encounter (Signed)
Saw refill request. Will send a 30 day refill but pt is due for follow up virtual visit please.

## 2018-08-09 ENCOUNTER — Ambulatory Visit (INDEPENDENT_AMBULATORY_CARE_PROVIDER_SITE_OTHER): Payer: 59 | Admitting: Family

## 2018-08-09 ENCOUNTER — Other Ambulatory Visit: Payer: Self-pay

## 2018-08-09 DIAGNOSIS — F419 Anxiety disorder, unspecified: Secondary | ICD-10-CM | POA: Diagnosis not present

## 2018-08-09 DIAGNOSIS — F329 Major depressive disorder, single episode, unspecified: Secondary | ICD-10-CM

## 2018-08-09 DIAGNOSIS — F32A Depression, unspecified: Secondary | ICD-10-CM

## 2018-08-09 DIAGNOSIS — G43909 Migraine, unspecified, not intractable, without status migrainosus: Secondary | ICD-10-CM | POA: Diagnosis not present

## 2018-08-09 MED ORDER — SUMATRIPTAN SUCCINATE 50 MG PO TABS: ORAL_TABLET | ORAL | 5 refills | Status: DC

## 2018-08-09 MED ORDER — CLONAZEPAM 0.5 MG PO TABS: 0.5000 mg | ORAL_TABLET | Freq: Three times a day (TID) | ORAL | 0 refills | Status: DC | PRN

## 2018-08-09 MED ORDER — BUSPIRONE HCL 7.5 MG PO TABS: 7.5000 mg | ORAL_TABLET | Freq: Two times a day (BID) | ORAL | 1 refills | Status: DC

## 2018-08-09 NOTE — Telephone Encounter (Signed)
Was scheduled to be evaluated today

## 2018-08-09 NOTE — Progress Notes (Signed)
Virtual Visit via Video Note  I connected with Shannon [name] on 08/09/18 at 10:00 AM EDT by a video enabled telemedicine application and verified that I am speaking with the correct person using two identifiers. This visit type was conducted due to national recommendations for restrictions regarding the COVID-19 Pandemic (e.g. social distancing).  This format is felt to be most appropriate for this patient at this time.   I discussed the limitations of evaluation and management by telemedicine and the availability of in person appointments. The patient expressed understanding and agreed to proceed.  Only the patient and myself were on today's video visit. The patient was at home and I was in my office at the time of today's visit.   History of Present Illness:  Depression- patient continues wellbutrin xl 300mg  as well as prn clonazepam. Reports that her 2.5 yr old granddaughter is staying with her and that this is a good distraction for her. She reports that overall mood is stable.   Anxiety- reports sister and brother-in-law got tested yesterday for covid-19 yesterday. They are both in [place].   She is worried about her parents who are elderly. Reports that she is using clonazepam tid. Reports increase in her anxiety.   Migraines- reports that she had her period 3/19.  Reports that she has had some headaches which have been relieved by otc migraine meds.       Observations/Objective:    Office Visit from 08/09/2018 in [facility] at [facility]  PHQ-9 Total Score  7     GAD 7 : Generalized Anxiety Score 08/09/2018  Nervous, Anxious, on Edge 3  Control/stop worrying 3  Worry too much - different things 3  Trouble relaxing 3  Restless 3  Easily annoyed or irritable 0  Afraid - awful might happen 1  Total GAD 7 Score 16  Anxiety Difficulty Very difficult    Gen: Awake, alert, no acute distress Resp: Breathing is even and non-labored Psych:  calm/pleasant demeanor, intermittently tearful during visit Neuro: Alert and Oriented x 3, + facial symmetry, speech is clear.   Assessment and Plan:  Depression- currently stable.  Continue wellbutrin.  Anxiety- uncontrolled.  Will retry buspar. She was on previously and it caused constipation. She is now on linzess and is interested in trying buspar again. I suggested that she add a stool softener once daily such as colace with the buspar to help prevent worsening constipation. She will continue clonazepam prn.  Migraines- stable. Requests refill of imitrex to have on hand. Refill has been sent.   Follow Up Instructions:    I discussed the assessment and treatment plan with the patient. The patient was provided an opportunity to ask questions and all were answered. The patient agreed with the plan and demonstrated an understanding of the instructions.   The patient was advised to call back or seek an in-person evaluation if the symptoms worsen or if the condition fails to improve as anticipated.    [name], NP

## 2018-09-11 ENCOUNTER — Other Ambulatory Visit: Payer: Self-pay | Admitting: Family

## 2018-09-12 ENCOUNTER — Ambulatory Visit: Payer: 59

## 2018-09-19 ENCOUNTER — Ambulatory Visit (INDEPENDENT_AMBULATORY_CARE_PROVIDER_SITE_OTHER): Payer: 59 | Admitting: Family

## 2018-09-19 ENCOUNTER — Other Ambulatory Visit: Payer: Self-pay

## 2018-09-19 DIAGNOSIS — F32A Depression, unspecified: Secondary | ICD-10-CM

## 2018-09-19 DIAGNOSIS — G43809 Other migraine, not intractable, without status migrainosus: Secondary | ICD-10-CM

## 2018-09-19 DIAGNOSIS — F329 Major depressive disorder, single episode, unspecified: Secondary | ICD-10-CM | POA: Diagnosis not present

## 2018-09-19 DIAGNOSIS — F419 Anxiety disorder, unspecified: Secondary | ICD-10-CM

## 2018-09-19 NOTE — Progress Notes (Signed)
Virtual Visit via Video Note  I connected with Shannon [name] on 09/19/18 at 11:00 AM EDT by a video enabled telemedicine application and verified that I am speaking with the correct person using two identifiers. This visit type was conducted due to national recommendations for restrictions regarding the COVID-19 Pandemic (e.g. social distancing).  This format is felt to be most appropriate for this patient at this time.   I discussed the limitations of evaluation and management by telemedicine and the availability of in person appointments. The patient expressed understanding and agreed to proceed.  Only the patient and myself were on today's video visit. The patient was at home and I was in my office at the time of today's visit.   History of Present Illness:  Depression- last visit we continued her wellbutrin. Reports that her brother  diagnosed with aggressive prostate cancer.  Mom has dementia. She continues counseling. She reports    Office Visit from 09/19/2018 in [facility] at [facility]  PHQ-9 Total Score  9     Anxiety- Last visit we began buspar. Reports that she is only taking once a day at night due to somnolence and increased constipation.  Reports that she takes linzess.    GAD 7 : Generalized Anxiety Score 09/19/2018 08/09/2018  Nervous, Anxious, on Edge 3 3  Control/stop worrying 3 3  Worry too much - different things 3 3  Trouble relaxing 3 3  Restless 3 3  Easily annoyed or irritable 0 0  Afraid - awful might happen 3 1  Total GAD 7 Score 18 16  Anxiety Difficulty Very difficult Very difficult   Migraine- reports that she developed a migraine after her period in March.  Reports that she is having at least 1-2 migraines a week.     Observations/Objective:   Gen: Awake, alert, no acute distress Resp: Breathing is even and non-labored Psych: calm/pleasant demeanor, moderately tearful Neuro: Alert and Oriented x 3, + facial symmetry,  speech is clear.  Assessment and Plan:  Anxiety- uncontrolled. Advised pt to try increasing buspar to bid and adding miralax to help with constipation. Continue klonopin.  Depression- fair control on wellbutrin. She declines referral to psychiatry at this time as she does not wish to start any new medications. She feels that a lot of her symptoms are related to family stressors and shelter at home.  I encouraged her to schedule a follow up with her counselor.  Migraine- seems to be responding to prn imitrex. I advised pt to let me know if symptoms worsen or if symptoms become more frequent.   Follow Up Instructions:    I discussed the assessment and treatment plan with the patient. The patient was provided an opportunity to ask questions and all were answered. The patient agreed with the plan and demonstrated an understanding of the instructions.   The patient was advised to call back or seek an in-person evaluation if the symptoms worsen or if the condition fails to improve as anticipated.    [name], NP

## 2018-10-10 ENCOUNTER — Other Ambulatory Visit: Payer: Self-pay | Admitting: Family

## 2018-10-10 NOTE — Telephone Encounter (Signed)
Requesting: Clonazepam Contract: 12/23/2014 UDS: 12/26/2017, moderate risk Last OV: 09/19/2018 Next OV: N/A Last Refill: 09/12/2018, #80--0 RF Database:   Please advise

## 2018-10-12 IMAGING — MG 2D DIGITAL DIAGNOSTIC UNILATERAL LEFT MAMMOGRAM WITH CAD AND ADJ
6 series · 6 of 14 positions shown · non-contrast
Comparison: Previous exam(s).

CLINICAL DATA: Patient was called back from screening mammogram for
a possible mass in the left breast.

EXAM:
2D DIGITAL DIAGNOSTIC LEFT MAMMOGRAM WITH ADJUNCT TOMO
ULTRASOUND LEFT BREAST

[L CC]
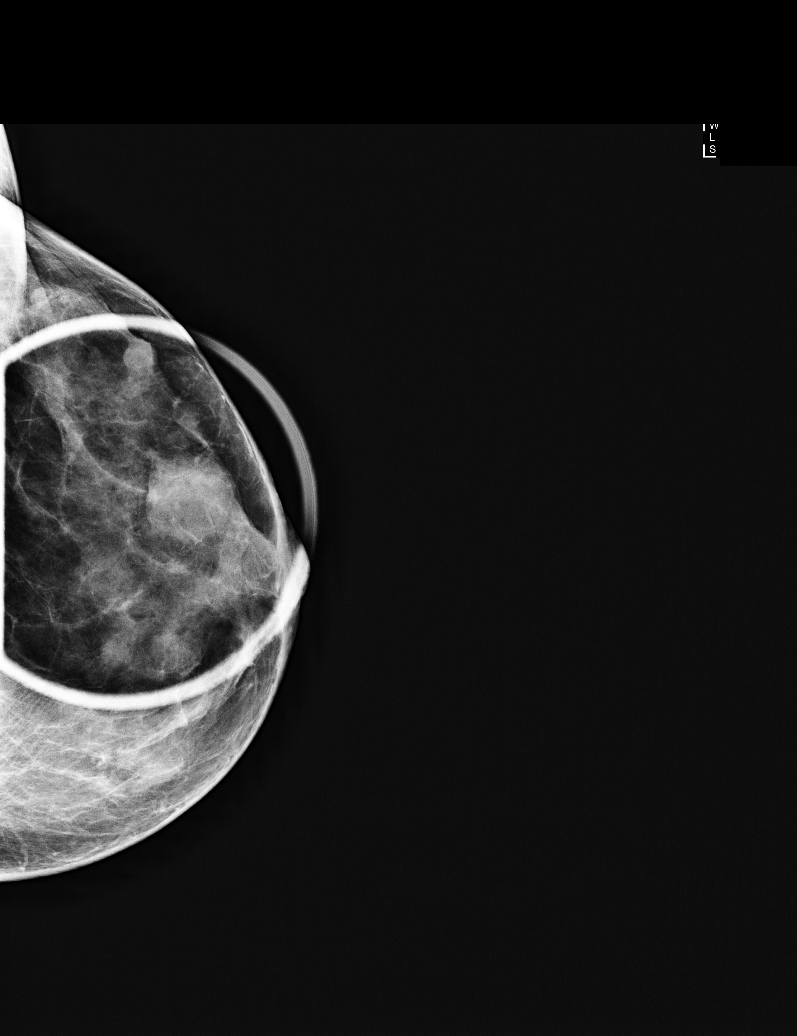

[L CC synth-2D]
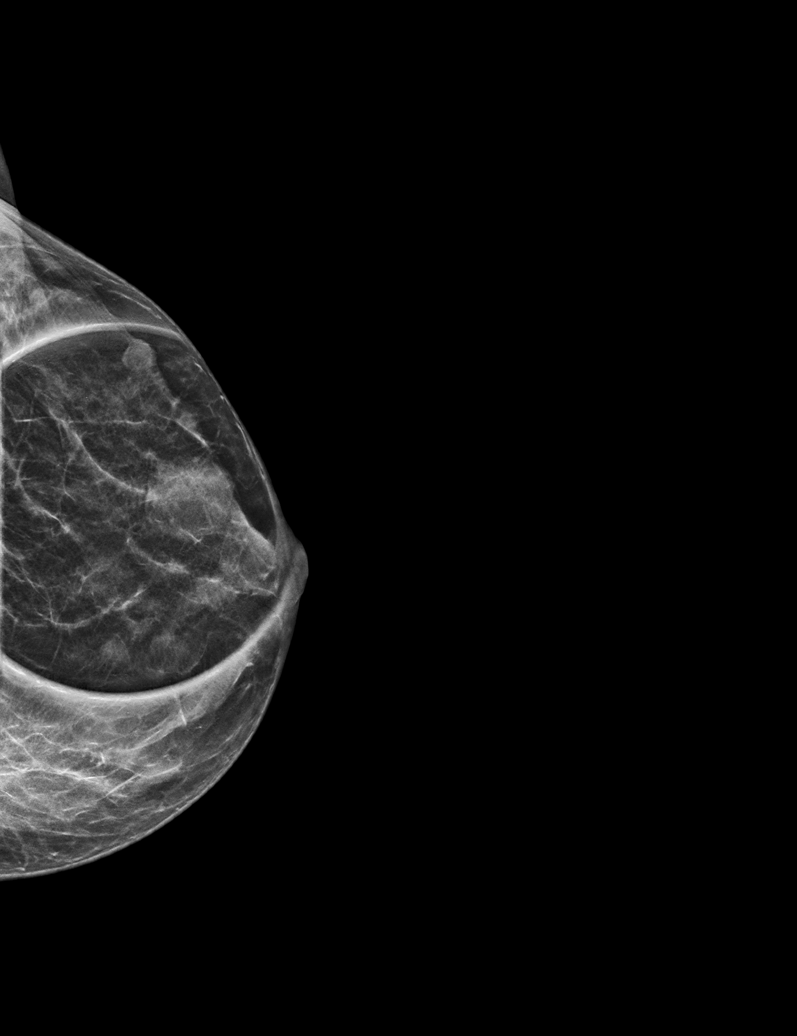

[L MLO]
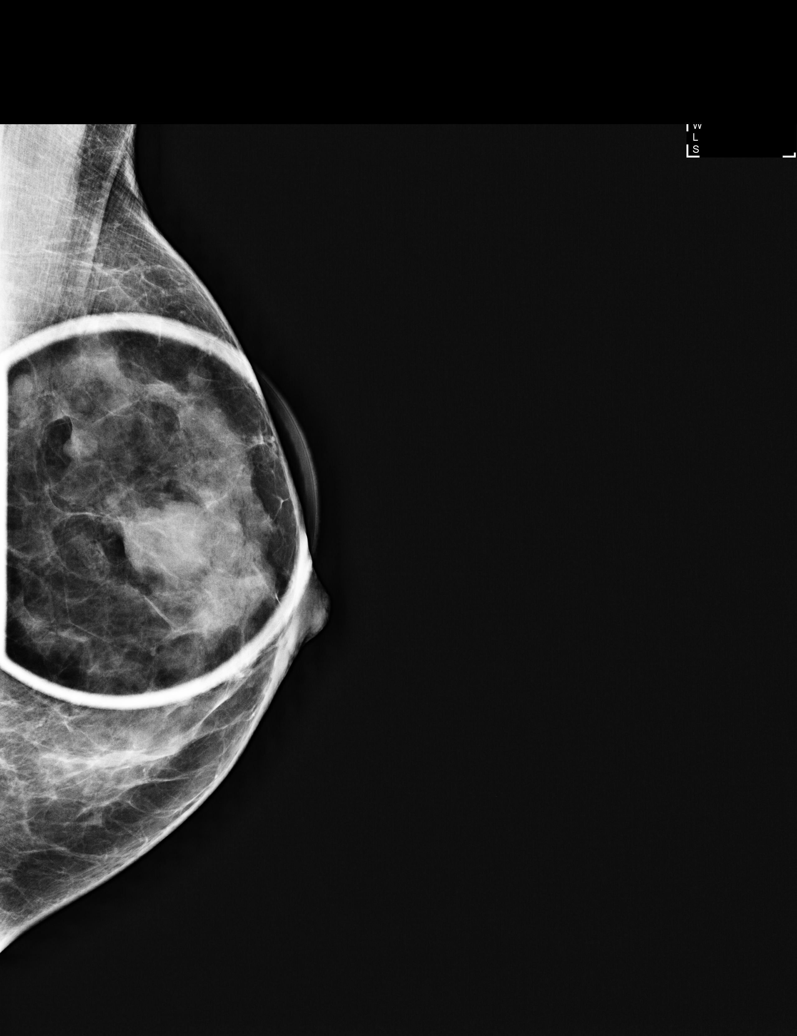

[L MLO synth-2D]
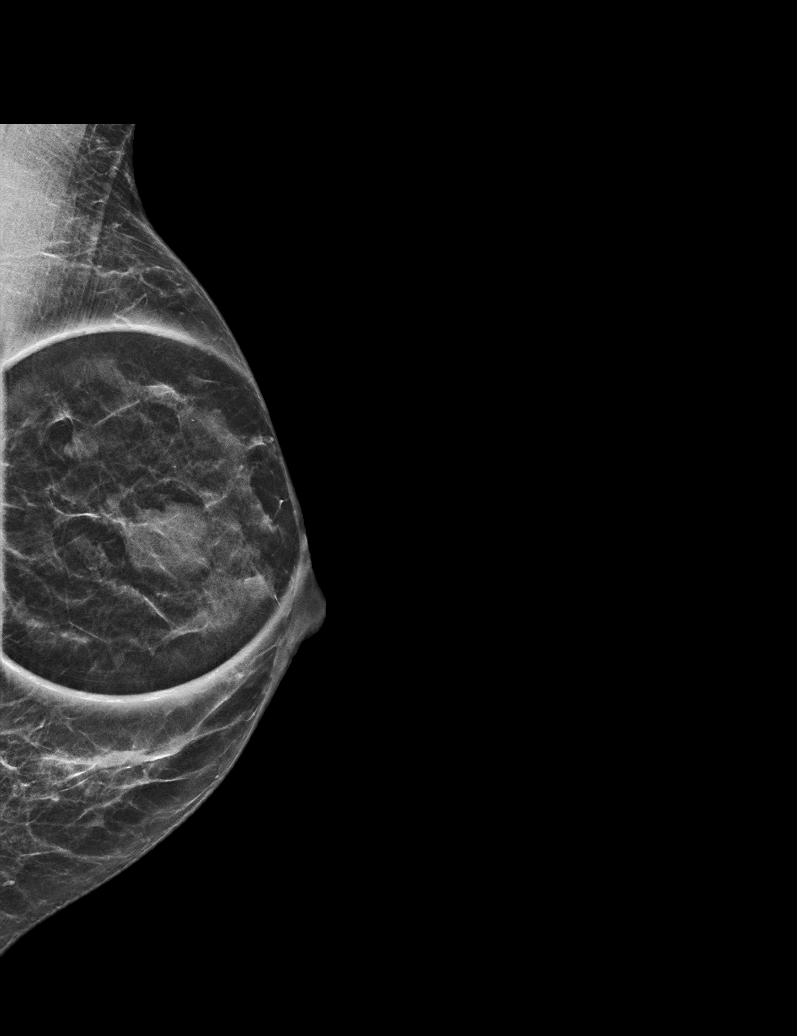

[L CC tomo · tomo slice 15/29.0]
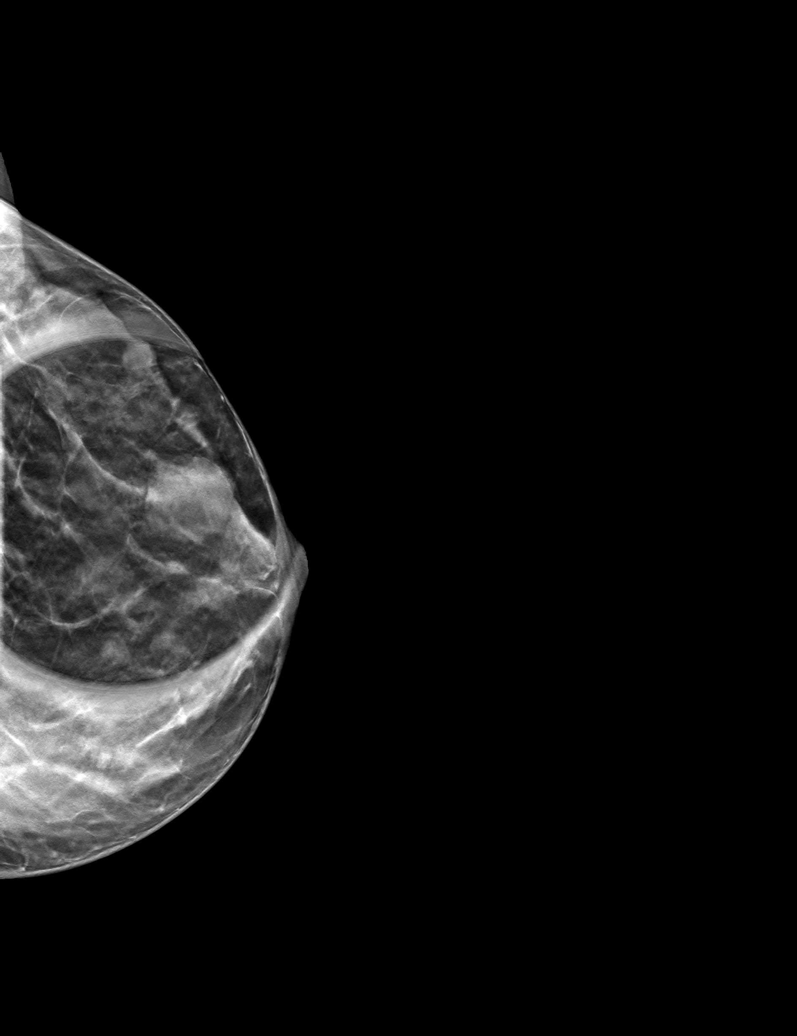

[L MLO tomo · tomo slice 15/29.0]
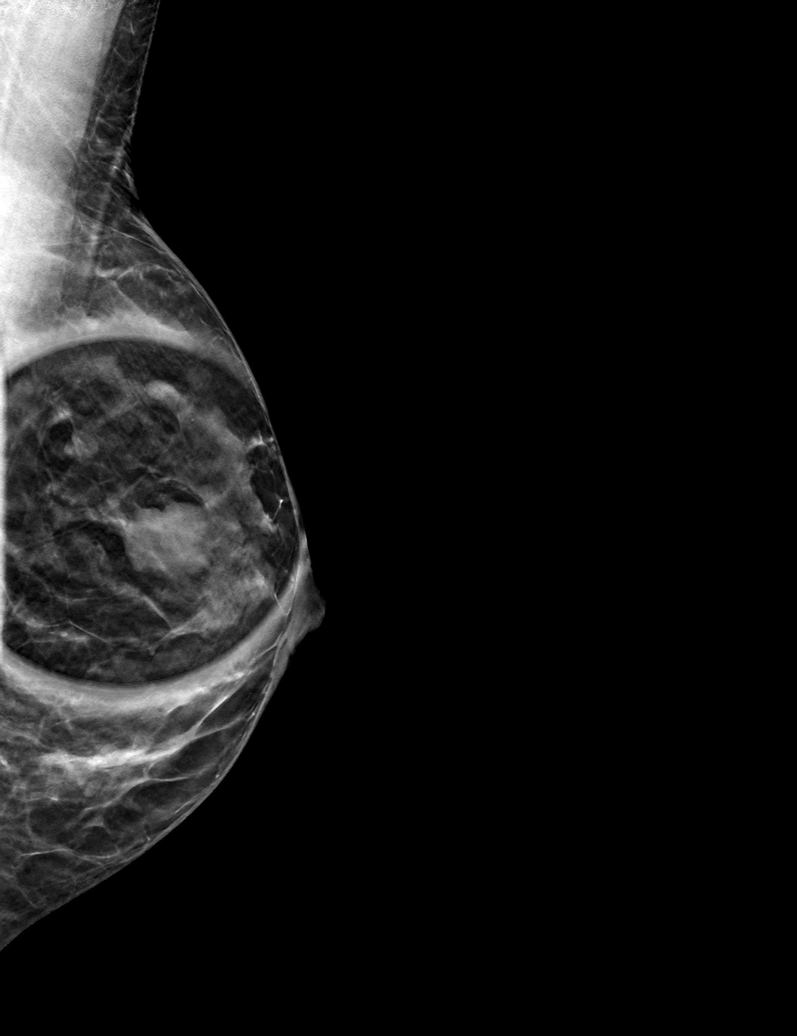

[6 of 14 positions shown; findings below may reference images not displayed]

ACR Breast Density Category c: The breast tissue is heterogeneously
dense, which may obscure small masses.
FINDINGS: Additional imaging of the left breast was performed. There is a
well-circumscribed 1.9 cm mass in the anterior third of the
upper-outer quadrant of the left breast. There is a smaller
well-circumscribed 6 mm mass in the posterior third of the
upper-outer quadrant of the left breast. There are no malignant type
microcalcifications.

On physical exam, I palpate a discrete mass in the left breast at 2
o'clock 1 cm from the nipple.

Targeted ultrasound is performed, showing a well-circumscribed near
anechoic cyst with some internal debris in the left breast at 2
o'clock 1 cm from the nipple measuring 1.9 x 1.2 x 1.4 cm. There is
a well-circumscribed anechoic cyst in the left breast at [DATE] 4 cm
from the nipple measuring 0.8 x 0.6 x 0.9 cm. Several smaller cysts
are seen. There is no solid mass or abnormal shadowing.
IMPRESSION: Left breast cysts.  No evidence of malignancy in either breast.

RECOMMENDATION:
Bilateral screening mammogram in 1 year is recommended.

I have discussed the findings and recommendations with the patient.
Results were also provided in writing at the conclusion of the
visit. If applicable, a reminder letter will be sent to the patient
regarding the next appointment.

BI-RADS CATEGORY  2: Benign.

## 2018-10-12 IMAGING — US ULTRASOUND LEFT BREAST LIMITED
1 series · 11 of 11 positions shown · non-contrast
Comparison: Previous exam(s).

CLINICAL DATA: Patient was called back from screening mammogram for
a possible mass in the left breast.

EXAM:
2D DIGITAL DIAGNOSTIC LEFT MAMMOGRAM WITH ADJUNCT TOMO
ULTRASOUND LEFT BREAST

[Series 1: ultrasound left breast limited · 0.06mm/px · 11 of 11 slices shown]
[im 1/11]
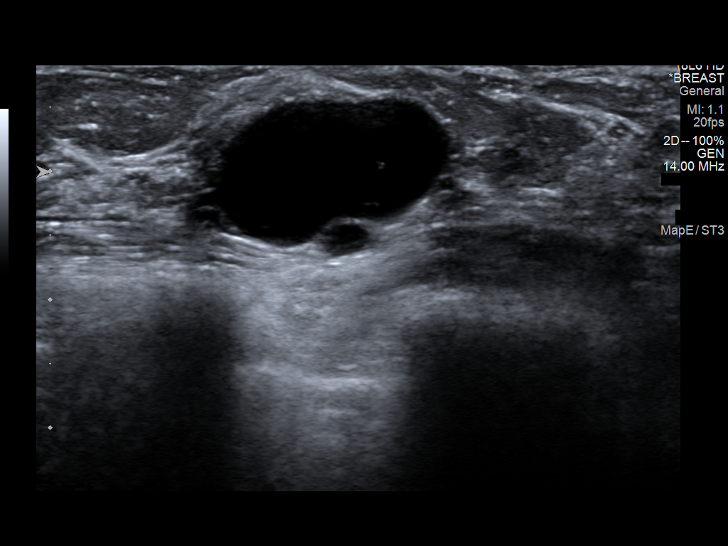
[im 2/11]
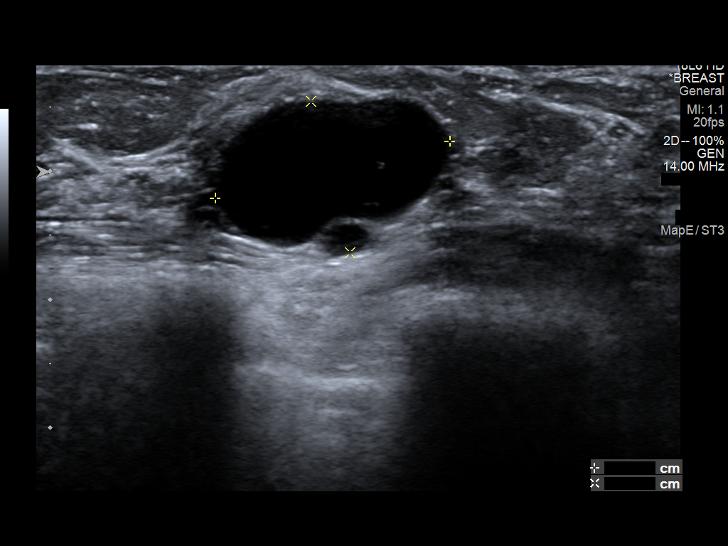
[im 3/11]
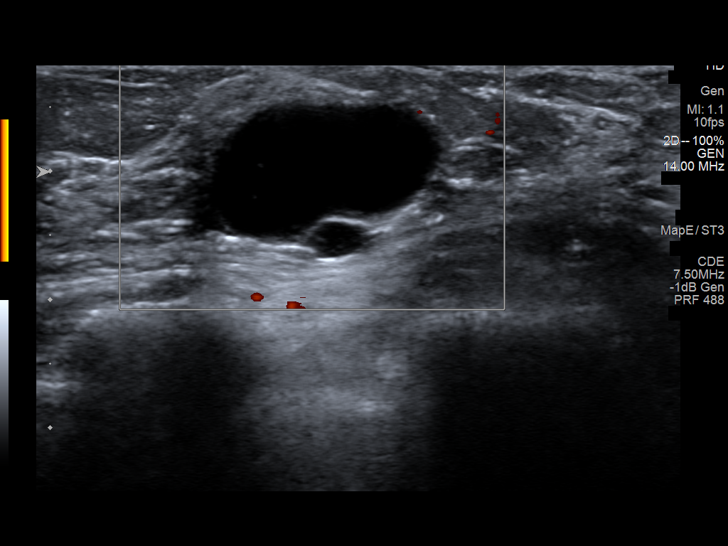
[im 4/11]
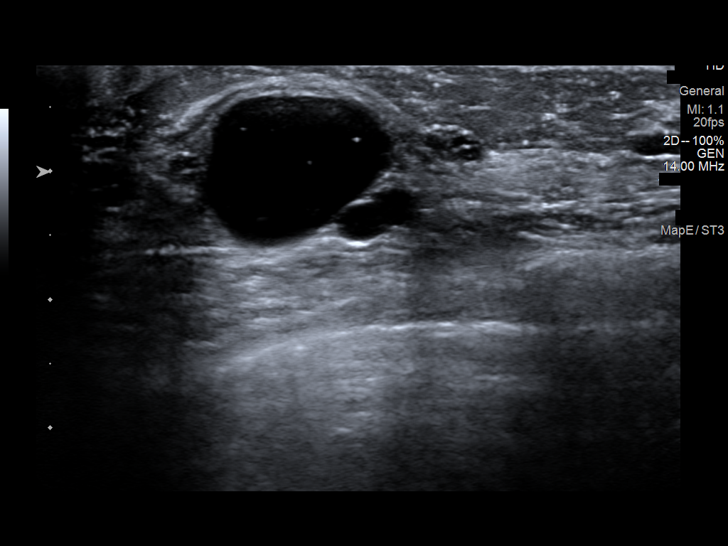
[im 5/11]
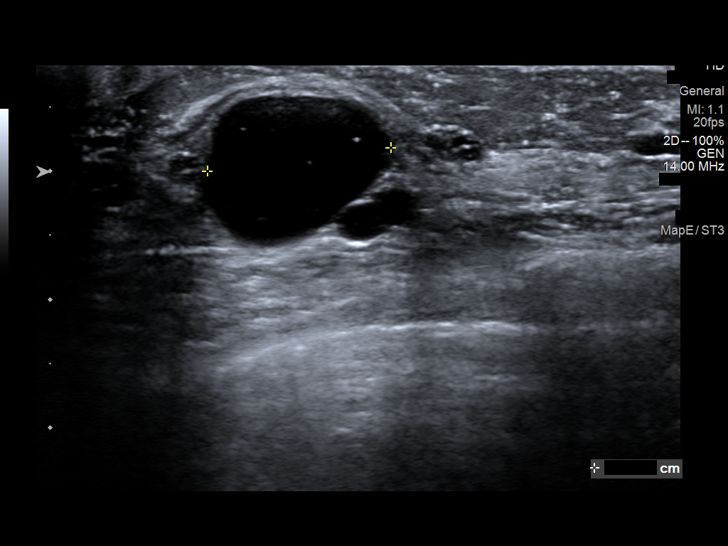
[im 6/11]
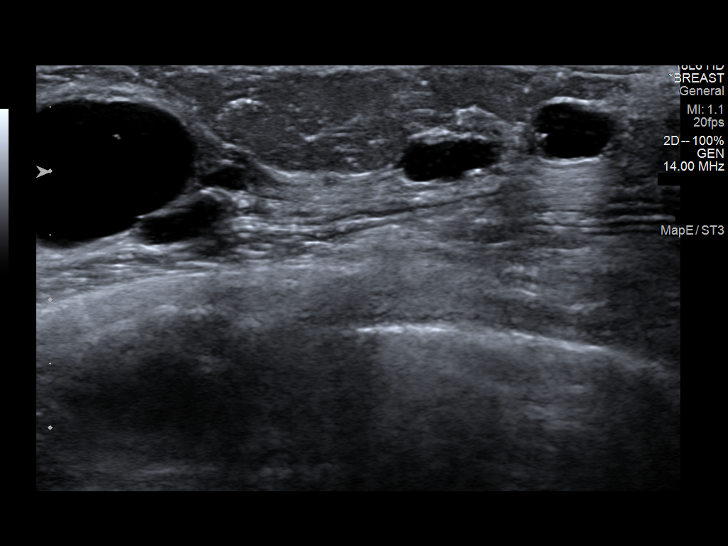
[im 7/11]
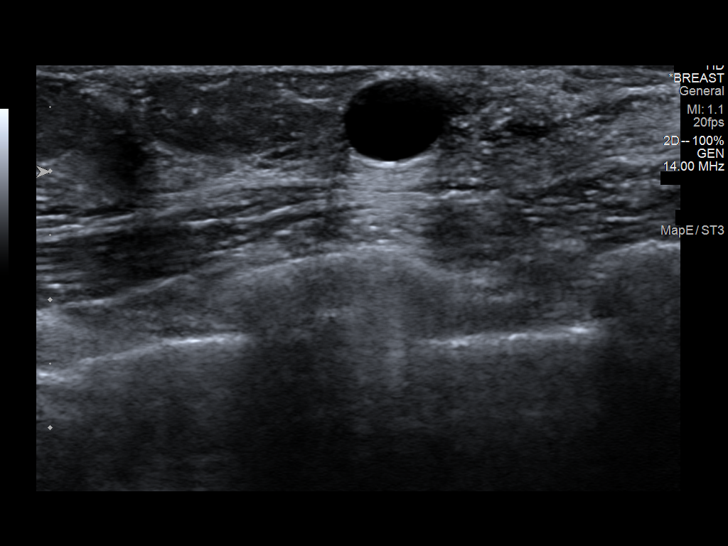
[im 8/11]
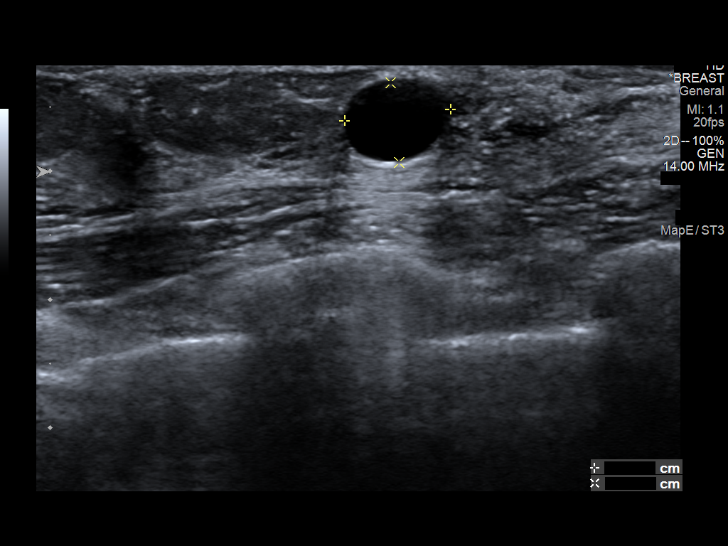
[im 9/11]
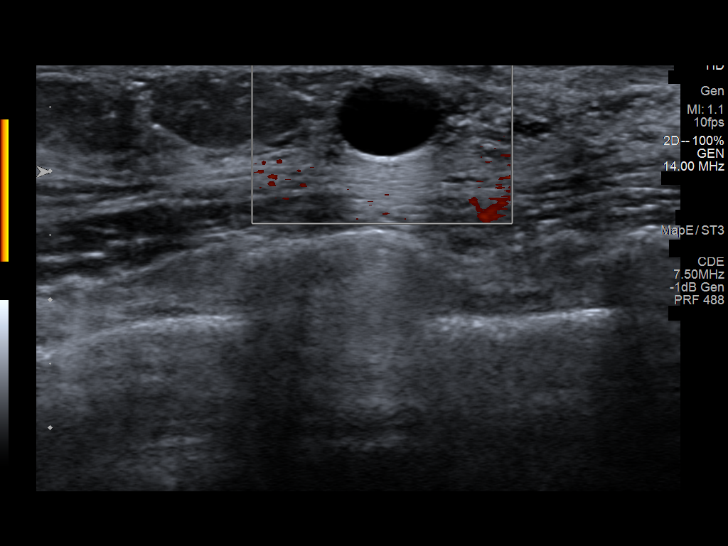
[im 10/11]
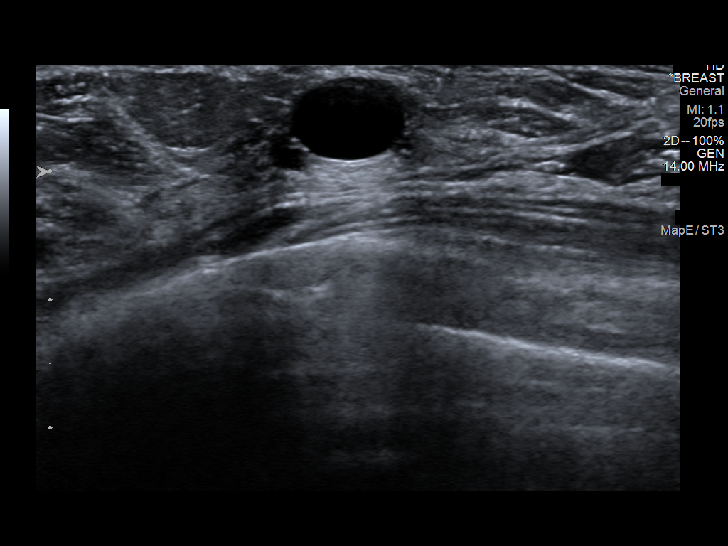
[im 11/11]
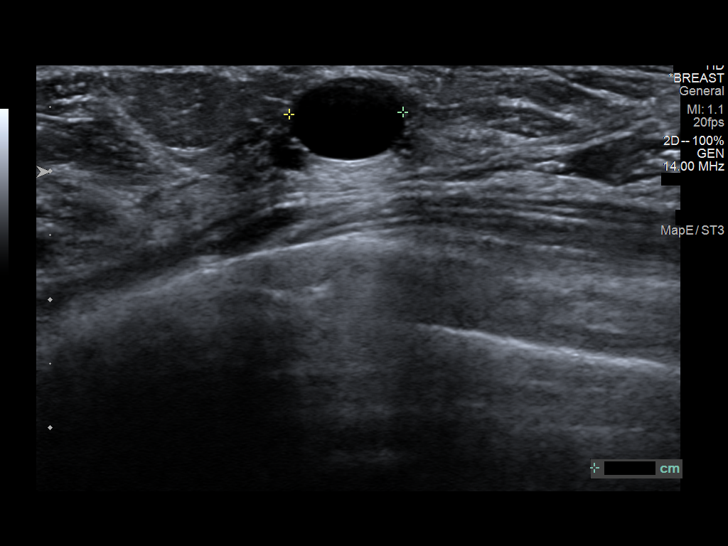

[11 of 11 positions shown; findings below may reference images not displayed]

ACR Breast Density Category c: The breast tissue is heterogeneously
dense, which may obscure small masses.
FINDINGS: Additional imaging of the left breast was performed. There is a
well-circumscribed 1.9 cm mass in the anterior third of the
upper-outer quadrant of the left breast. There is a smaller
well-circumscribed 6 mm mass in the posterior third of the
upper-outer quadrant of the left breast. There are no malignant type
microcalcifications.

On physical exam, I palpate a discrete mass in the left breast at 2
o'clock 1 cm from the nipple.

Targeted ultrasound is performed, showing a well-circumscribed near
anechoic cyst with some internal debris in the left breast at 2
o'clock 1 cm from the nipple measuring 1.9 x 1.2 x 1.4 cm. There is
a well-circumscribed anechoic cyst in the left breast at [DATE] 4 cm
from the nipple measuring 0.8 x 0.6 x 0.9 cm. Several smaller cysts
are seen. There is no solid mass or abnormal shadowing.
IMPRESSION: Left breast cysts.  No evidence of malignancy in either breast.

RECOMMENDATION:
Bilateral screening mammogram in 1 year is recommended.

I have discussed the findings and recommendations with the patient.
Results were also provided in writing at the conclusion of the
visit. If applicable, a reminder letter will be sent to the patient
regarding the next appointment.

BI-RADS CATEGORY  2: Benign.

## 2018-10-18 ENCOUNTER — Ambulatory Visit
Admission: RE | Admit: 2018-10-18 | Discharge: 2018-10-18 | Disposition: A | Discharge status: Home / Self Care | Payer: 59 | Source: Ambulatory Visit | Attending: Obstetrics and Gynecology | Admitting: Obstetrics and Gynecology

## 2018-10-18 ENCOUNTER — Other Ambulatory Visit: Payer: Self-pay

## 2018-10-18 DIAGNOSIS — Z1231 Encounter for screening mammogram for malignant neoplasm of breast: Secondary | ICD-10-CM

## 2018-10-31 ENCOUNTER — Ambulatory Visit: Payer: 59

## 2018-11-13 ENCOUNTER — Other Ambulatory Visit: Payer: Self-pay | Admitting: Family

## 2018-12-17 ENCOUNTER — Other Ambulatory Visit: Payer: Self-pay | Admitting: Family

## 2019-01-22 ENCOUNTER — Other Ambulatory Visit: Payer: Self-pay | Admitting: Family

## 2019-01-22 NOTE — Telephone Encounter (Signed)
Last RX: 12-18-18 #80 Last OV:  09-19-2018 Next OV:  None scheduled UDS:     12-26-17 CSC:     06-20-17 CSR:     No discrepancies

## 2019-02-10 ENCOUNTER — Other Ambulatory Visit: Payer: Self-pay | Admitting: Family

## 2019-02-13 NOTE — Telephone Encounter (Signed)
Called pt left msg  °

## 2019-02-13 NOTE — Telephone Encounter (Signed)
Pt should follow up in November for 6 month follow up. Please contact pt to schedule.

## 2019-02-13 NOTE — Telephone Encounter (Signed)
Shannon [name] - I sent in refill of buspar but did not see that pt has any future follow ups scheduled.  She was last seen 09/19/18. Please advise?

## 2019-03-04 ENCOUNTER — Other Ambulatory Visit: Payer: Self-pay | Admitting: Family

## 2019-03-06 ENCOUNTER — Other Ambulatory Visit: Payer: Self-pay | Admitting: Family

## 2019-03-07 ENCOUNTER — Ambulatory Visit (INDEPENDENT_AMBULATORY_CARE_PROVIDER_SITE_OTHER): Payer: 59 | Admitting: Family

## 2019-03-07 DIAGNOSIS — R42 Dizziness and giddiness: Secondary | ICD-10-CM

## 2019-03-07 DIAGNOSIS — R519 Headache, unspecified: Secondary | ICD-10-CM

## 2019-03-07 DIAGNOSIS — F419 Anxiety disorder, unspecified: Secondary | ICD-10-CM | POA: Diagnosis not present

## 2019-03-07 MED ORDER — BUSPIRONE HCL 15 MG PO TABS: 15.0000 mg | ORAL_TABLET | Freq: Two times a day (BID) | ORAL | 2 refills | Status: DC

## 2019-03-07 MED ORDER — MECLIZINE HCL 25 MG PO TABS: 25.0000 mg | ORAL_TABLET | Freq: Three times a day (TID) | ORAL | 0 refills | Status: DC | PRN

## 2019-03-07 NOTE — Progress Notes (Signed)
   Subjective:    Patient ID: Shannon [name], female    DOB: 1961-03-12, 58 y.o.   MRN: [id number]  HPI    Review of Systems     Objective:   Physical Exam        Assessment & Plan:

## 2019-03-07 NOTE — Progress Notes (Signed)
Virtual Visit via Video Note  I connected with Shannon [name] on 03/07/19 at 11:00 AM EST by a video enabled telemedicine application and verified that I am speaking with the correct person using two identifiers.  Location: Patient: home Provider: work   I discussed the limitations of evaluation and management by telemedicine and the availability of in person appointments. The patient expressed understanding and agreed to proceed.  History of Present Illness:  Patient is a 58 yr old female who presents today for follow up.   anxiety- reports that her anxiety is worse recently. Depression is stable. She is using klonopin prn. Not taking more than 2 a day.   Reports that she has been having episodes of dizziness with head movement. Occurs in the AM, last a few hours.    Had 1 migraines in the last 1 month. Notes some right frontal headache.    Past Medical History:  Diagnosis Date  . Anemia   . Anxiety   . Depression   . Eating disorder    anorexia nervosa in her 20's  . Fainting spell   . Hx of laminectomy 1994   L4-5  . Kidney stones    passed on their own  . Migraine   . Stomach disorder    due to complications with cholecystectomy "almost died"     Social History   Socioeconomic History  . Marital status: Married    Spouse name: Not on file  . Number of children: 3  . Years of education: Not on file  . Highest education level: Not on file  Occupational History    Employer: SELF EMPLOYED  Social Needs  . Financial resource strain: Not on file  . Food insecurity    Worry: Not on file    Inability: Not on file  . Transportation needs    Medical: Not on file    Non-medical: Not on file  Tobacco Use  . Smoking status: Never Smoker  . Smokeless tobacco: Never Used  Substance and Sexual Activity  . Alcohol use: No  . Drug use: Yes  . Sexual activity: Yes    Partners: Male    Birth control/protection: Surgical  Lifestyle  . Physical activity    Days per  week: Not on file    Minutes per session: Not on file  . Stress: Not on file  Relationships  . Social Herbalist on phone: Not on file    Gets together: Not on file    Attends religious service: Not on file    Active member of club or organization: Not on file    Attends meetings of clubs or organizations: Not on file    Relationship status: Not on file  . Intimate partner violence    Fear of current or ex partner: Not on file    Emotionally abused: Not on file    Physically abused: Not on file    Forced sexual activity: Not on file  Other Topics Concern  . Not on file  Social History Narrative   Regular exercise:  Yes (swim instructor)   Caffeine Use:  1 daily   Married 3 children ages 40 son, 84 son, and 31 daughter   Associates degree                Past Surgical History:  Procedure Laterality Date  . APPENDECTOMY  1976  . BREAST BIOPSY Right   . CHOLECYSTECTOMY  08/2009   reports history of gallbladder  polyps, she reports stents in duct of lushca   . HYSTEROSCOPY  07/2011  . LAMINECTOMY  1994   L4-5  . TONSILLECTOMY  1982  . TUBAL LIGATION      Family History  Problem Relation Age of Onset  . Hypertension Mother   . Diabetes Father   . Hypertension Father   . Hypothyroidism Father   . CAD Father   . Alcohol abuse Maternal Grandmother   . Cancer Paternal Grandfather        lung cancer  . Hypothyroidism Other   . Hashimoto's thyroiditis Sister   . Hypothyroidism Sister   . Neuropathy Sister        chronic inflammatory demyelinating peripheral neuropathy from lyme disease    Allergies  Allergen Reactions  . Buprenorphine Hcl     Other reaction(s): Other (See Comments) No relief  . Buspar [Buspirone]     constipation  . Eszopiclone     Headaches  . Flexeril [Cyclobenzaprine Hcl] Other (See Comments)    Mood swings  . Gabapentin     sedation  . Morphine And Related Other (See Comments)    No relief  . Nsaids Other (See Comments)    GI  bleed  . Paroxetine Nausea Only       . Shellfish Allergy Other (See Comments)    skin & mouth tingle  . Tizanidine Hcl Other (See Comments)    Dizziness, mood swings  . Tolmetin     Other reaction(s): Other (See Comments) GI bleed    Current Outpatient Medications on File Prior to Visit  Medication Sig Dispense Refill  . buPROPion (WELLBUTRIN XL) 300 MG 24 hr tablet TAKE ONE TABLET BY MOUTH DAILY 30 tablet 0  . clonazePAM (KLONOPIN) 0.5 MG tablet Take 1 tablet (0.5 mg total) by mouth 3 (three) times daily as needed for anxiety. 80 tablet 0  . ondansetron (ZOFRAN-ODT) 4 MG disintegrating tablet Take 1 tablet every 8 hours as needed for migraine    . SUMAtriptan (IMITREX) 50 MG tablet May repeat in 2 hours if headache persists or recurs.(max 2 tabs in any 24 hours) 10 tablet 5   No current facility-administered medications on file prior to visit.     There were no vitals taken for this visit.   Observations/Objective:   Gen: Awake, alert, no acute distress Resp: Breathing is even and non-labored Psych: calm/pleasant demeanor Neuro: Alert and Oriented x 3, + facial symmetry, speech is clear.   Assessment and Plan:  Anxiety- uncontrolled. Will increase buspar from 7.5mg  bid to 15 mg bid. Continue prn klonopin.  Vertigo- symptoms consistent with mild vertigo. Will give trial of prn meclizine. Pt is advised to call if symptoms worsen or if they fail to improve.  Mild headache- etiology unclear.  Plan office visit if symptoms worsen or fail to improve.   Follow Up Instructions:    I discussed the assessment and treatment plan with the patient. The patient was provided an opportunity to ask questions and all were answered. The patient agreed with the plan and demonstrated an understanding of the instructions.   The patient was advised to call back or seek an in-person evaluation if the symptoms worsen or if the condition fails to improve as anticipated.  Shannon Pear, NP

## 2019-03-19 ENCOUNTER — Telehealth: Payer: 59 | Admitting: Family

## 2019-04-03 ENCOUNTER — Other Ambulatory Visit: Payer: Self-pay | Admitting: Family

## 2019-04-06 ENCOUNTER — Other Ambulatory Visit: Payer: Self-pay | Admitting: Family

## 2019-04-10 ENCOUNTER — Other Ambulatory Visit: Payer: Self-pay | Admitting: Family Medicine

## 2019-04-10 ENCOUNTER — Other Ambulatory Visit: Payer: Self-pay

## 2019-04-10 DIAGNOSIS — Z20822 Contact with and (suspected) exposure to covid-19: Secondary | ICD-10-CM

## 2019-04-11 ENCOUNTER — Encounter: Payer: Self-pay | Admitting: Family

## 2019-04-12 LAB — NOVEL CORONAVIRUS, NAA: SARS-CoV-2, NAA: NOT DETECTED

## 2019-04-20 ENCOUNTER — Encounter: Payer: Self-pay | Admitting: Family

## 2019-05-05 ENCOUNTER — Other Ambulatory Visit: Payer: Self-pay | Admitting: Family

## 2019-05-11 ENCOUNTER — Other Ambulatory Visit: Payer: Self-pay | Admitting: Family

## 2019-05-11 NOTE — Telephone Encounter (Signed)
Last OV 03/07/19 Last refill 04/09/19 #80/0 Next OV not scheduled

## 2019-05-21 ENCOUNTER — Ambulatory Visit (INDEPENDENT_AMBULATORY_CARE_PROVIDER_SITE_OTHER): Payer: 59 | Admitting: Family

## 2019-05-21 ENCOUNTER — Encounter: Payer: Self-pay | Admitting: Family

## 2019-05-21 DIAGNOSIS — J019 Acute sinusitis, unspecified: Secondary | ICD-10-CM

## 2019-05-21 DIAGNOSIS — J029 Acute pharyngitis, unspecified: Secondary | ICD-10-CM | POA: Diagnosis not present

## 2019-05-21 MED ORDER — AMOXICILLIN 500 MG PO CAPS: 500.0000 mg | ORAL_CAPSULE | Freq: Three times a day (TID) | ORAL | 0 refills | Status: DC

## 2019-05-21 NOTE — Progress Notes (Signed)
Virtual Visit via Video Note  I connected with Shannon [name] on 05/21/19 at  9:20 AM EST by a video enabled telemedicine application and verified that I am speaking with the correct person using two identifiers.  Location: Patient: home Provider: home   I discussed the limitations of evaluation and management by telemedicine and the availability of in person appointments. The patient expressed understanding and agreed to proceed.  History of Present Illness:  Reports that her ears are popping.  Started about 1 week ago and got worse this weekend. Denies fever.  She reports + sore throat, reports moderate sore throat pain.  She has not examined her throat.  Reports that her sinus congestion is clear.  Denies loss of taste or smell. Tested negative for covid on 05/07/19. No known covid exposure.  Past Medical History:  Diagnosis Date  . Anemia   . Anxiety   . Depression   . Eating disorder    anorexia nervosa in her 20's  . Fainting spell   . Hx of laminectomy 1994   L4-5  . Kidney stones    passed on their own  . Migraine   . Stomach disorder    due to complications with cholecystectomy "almost died"     Social History   Socioeconomic History  . Marital status: Married    Spouse name: Not on file  . Number of children: 3  . Years of education: Not on file  . Highest education level: Not on file  Occupational History    Employer: SELF EMPLOYED  Tobacco Use  . Smoking status: Never Smoker  . Smokeless tobacco: Never Used  Substance and Sexual Activity  . Alcohol use: No  . Drug use: Yes  . Sexual activity: Yes    Partners: Male    Birth control/protection: Surgical  Other Topics Concern  . Not on file  Social History Narrative   Regular exercise:  Yes (swim instructor)   Caffeine Use:  1 daily   Married 3 children ages 32 son, 57 son, and 26 daughter   Associates degree               Social Determinants of Health   Financial Resource Strain:   .  Difficulty of Paying Living Expenses: Not on file  Food Insecurity:   . Worried About Charity fundraiser in the Last Year: Not on file  . Ran Out of Food in the Last Year: Not on file  Transportation Needs:   . Lack of Transportation (Medical): Not on file  . Lack of Transportation (Non-Medical): Not on file  Physical Activity:   . Days of Exercise per Week: Not on file  . Minutes of Exercise per Session: Not on file  Stress:   . Feeling of Stress : Not on file  Social Connections:   . Frequency of Communication with Friends and Family: Not on file  . Frequency of Social Gatherings with Friends and Family: Not on file  . Attends Religious Services: Not on file  . Active Member of Clubs or Organizations: Not on file  . Attends Archivist Meetings: Not on file  . Marital Status: Not on file  Intimate Partner Violence:   . Fear of Current or Ex-Partner: Not on file  . Emotionally Abused: Not on file  . Physically Abused: Not on file  . Sexually Abused: Not on file    Past Surgical History:  Procedure Laterality Date  . APPENDECTOMY  1976  .  BREAST BIOPSY Right   . CHOLECYSTECTOMY  08/2009   reports history of gallbladder polyps, she reports stents in duct of lushca   . HYSTEROSCOPY  07/2011  . LAMINECTOMY  1994   L4-5  . TONSILLECTOMY  1982  . TUBAL LIGATION      Family History  Problem Relation Age of Onset  . Hypertension Mother   . Diabetes Father   . Hypertension Father   . Hypothyroidism Father   . CAD Father   . Alcohol abuse Maternal Grandmother   . Cancer Paternal Grandfather        lung cancer  . Hypothyroidism Other   . Hashimoto's thyroiditis Sister   . Hypothyroidism Sister   . Neuropathy Sister        chronic inflammatory demyelinating peripheral neuropathy from lyme disease    Allergies  Allergen Reactions  . Buprenorphine Hcl     Other reaction(s): Other (See Comments) No relief  . Buspar [Buspirone]     constipation  . Eszopiclone      Headaches  . Flexeril [Cyclobenzaprine Hcl] Other (See Comments)    Mood swings  . Gabapentin     sedation  . Morphine And Related Other (See Comments)    No relief  . Nsaids Other (See Comments)    GI bleed  . Paroxetine Nausea Only       . Shellfish Allergy Other (See Comments)    skin & mouth tingle  . Tizanidine Hcl Other (See Comments)    Dizziness, mood swings  . Tolmetin     Other reaction(s): Other (See Comments) GI bleed    Current Outpatient Medications on File Prior to Visit  Medication Sig Dispense Refill  . buPROPion (WELLBUTRIN XL) 300 MG 24 hr tablet TAKE ONE TABLET BY MOUTH DAILY 30 tablet 0  . busPIRone (BUSPAR) 15 MG tablet Take 1 tablet (15 mg total) by mouth 2 (two) times daily. 60 tablet 2  . clonazePAM (KLONOPIN) 0.5 MG tablet TAKE ONE TABLET BY MOUTH THREE TIMES A DAY AS NEEDED FOR ANXIETY 80 tablet 0  . meclizine (ANTIVERT) 25 MG tablet Take 1 tablet (25 mg total) by mouth 3 (three) times daily as needed for dizziness. 30 tablet 0  . ondansetron (ZOFRAN-ODT) 4 MG disintegrating tablet Take 1 tablet every 8 hours as needed for migraine    . SUMAtriptan (IMITREX) 50 MG tablet May repeat in 2 hours if headache persists or recurs.(max 2 tabs in any 24 hours) 10 tablet 5  . [DISCONTINUED] buPROPion (WELLBUTRIN XL) 300 MG 24 hr tablet TAKE ONE TABLET BY MOUTH DAILY 30 tablet 0   No current facility-administered medications on file prior to visit.    There were no vitals taken for this visit.     Observations/Objective:   Gen: Awake, alert, no acute distress Resp: Breathing is even and non-labored Psych: calm/pleasant demeanor Neuro: Alert and Oriented x 3, + facial symmetry, speech is clear.   Assessment and Plan:  Sinusitis/sore throat- will plan empiric treatment with amoxicillin. She is advised to use tylenol as needed for pain, salt water gargles and chloraseptic spray for sore throat. Call if symptoms worsen or if not improved in 3 days. Pt  verbalizes understanding.  Follow Up Instructions:    I discussed the assessment and treatment plan with the patient. The patient was provided an opportunity to ask questions and all were answered. The patient agreed with the plan and demonstrated an understanding of the instructions.   The patient was  advised to call back or seek an in-person evaluation if the symptoms worsen or if the condition fails to improve as anticipated.  [name], NP

## 2019-05-21 NOTE — Telephone Encounter (Signed)
Please contact pt to schedule a virtual visit today.  ?

## 2019-06-04 ENCOUNTER — Other Ambulatory Visit: Payer: Self-pay | Admitting: Family

## 2019-06-06 ENCOUNTER — Encounter: Payer: Self-pay | Admitting: Family

## 2019-06-11 ENCOUNTER — Ambulatory Visit (INDEPENDENT_AMBULATORY_CARE_PROVIDER_SITE_OTHER): Payer: 59 | Admitting: Family

## 2019-06-11 ENCOUNTER — Encounter: Payer: Self-pay | Admitting: Family

## 2019-06-11 DIAGNOSIS — H9203 Otalgia, bilateral: Secondary | ICD-10-CM | POA: Diagnosis not present

## 2019-06-11 NOTE — Progress Notes (Signed)
Virtual Visit via Video Note  I connected with Shannon [name] on 06/11/19 at  9:20 AM EST by a video enabled telemedicine application and verified that I am speaking with the correct person using two identifiers.  Location: Patient: home Provider: home   I discussed the limitations of evaluation and management by telemedicine and the availability of in person appointments. The patient expressed understanding and agreed to proceed.  History of Present Illness: Pt is a 59 yr old female who presents today with chief complaint of otalgia.  Tested positive for COVID- 19 on 06/09/19.  She was seen in an urgent care and was told that she had bronchitis, bilateral ear infection, and "swimmer's ear" on the right.  Pt was treated with cefdinir and cortisporin drops.  She reports that she believes she is allergic to the cortisporin drops because she feels like Reports that she has itching/throbbing in her ears since she started the drops (was putting in both ears). Feels like the canals are swollen.  Provider switched her to demadex. She c/o that she had trouble sleeping last night due to the pain.   Past Medical History:  Diagnosis Date  . Anemia   . Anxiety   . Depression   . Eating disorder    anorexia nervosa in her 20's  . Fainting spell   . Hx of laminectomy 1994   L4-5  . Kidney stones    passed on their own  . Migraine   . Stomach disorder    due to complications with cholecystectomy "almost died"     Social History   Socioeconomic History  . Marital status: Married    Spouse name: Not on file  . Number of children: 3  . Years of education: Not on file  . Highest education level: Not on file  Occupational History    Employer: SELF EMPLOYED  Tobacco Use  . Smoking status: Never Smoker  . Smokeless tobacco: Never Used  Substance and Sexual Activity  . Alcohol use: No  . Drug use: Yes  . Sexual activity: Yes    Partners: Male    Birth control/protection: Surgical  Other  Topics Concern  . Not on file  Social History Narrative   Regular exercise:  Yes (swim instructor)   Caffeine Use:  1 daily   Married 3 children ages 22 son, 68 son, and 57 daughter   Associates degree               Social Determinants of Health   Financial Resource Strain:   . Difficulty of Paying Living Expenses: Not on file  Food Insecurity:   . Worried About Charity fundraiser in the Last Year: Not on file  . Ran Out of Food in the Last Year: Not on file  Transportation Needs:   . Lack of Transportation (Medical): Not on file  . Lack of Transportation (Non-Medical): Not on file  Physical Activity:   . Days of Exercise per Week: Not on file  . Minutes of Exercise per Session: Not on file  Stress:   . Feeling of Stress : Not on file  Social Connections:   . Frequency of Communication with Friends and Family: Not on file  . Frequency of Social Gatherings with Friends and Family: Not on file  . Attends Religious Services: Not on file  . Active Member of Clubs or Organizations: Not on file  . Attends Archivist Meetings: Not on file  . Marital Status: Not on  file  Intimate Partner Violence:   . Fear of Current or Ex-Partner: Not on file  . Emotionally Abused: Not on file  . Physically Abused: Not on file  . Sexually Abused: Not on file    Past Surgical History:  Procedure Laterality Date  . APPENDECTOMY  1976  . BREAST BIOPSY Right   . CHOLECYSTECTOMY  08/2009   reports history of gallbladder polyps, she reports stents in duct of lushca   . HYSTEROSCOPY  07/2011  . LAMINECTOMY  1994   L4-5  . TONSILLECTOMY  1982  . TUBAL LIGATION      Family History  Problem Relation Age of Onset  . Hypertension Mother   . Diabetes Father   . Hypertension Father   . Hypothyroidism Father   . CAD Father   . Alcohol abuse Maternal Grandmother   . Cancer Paternal Grandfather        lung cancer  . Hypothyroidism Other   . Hashimoto's thyroiditis Sister   .  Hypothyroidism Sister   . Neuropathy Sister        chronic inflammatory demyelinating peripheral neuropathy from lyme disease    Allergies  Allergen Reactions  . Buprenorphine Hcl     Other reaction(s): Other (See Comments) No relief  . Buspar [Buspirone]     constipation  . Cortisporin [Neomycin-Polymyxin-Hc] Swelling    Ear dropps  . Eszopiclone     Headaches  . Flexeril [Cyclobenzaprine Hcl] Other (See Comments)    Mood swings  . Gabapentin     sedation  . Morphine And Related Other (See Comments)    No relief  . Nsaids Other (See Comments)    GI bleed  . Paroxetine Nausea Only       . Shellfish Allergy Other (See Comments)    skin & mouth tingle  . Tizanidine Hcl Other (See Comments)    Dizziness, mood swings  . Tolmetin     Other reaction(s): Other (See Comments) GI bleed    Current Outpatient Medications on File Prior to Visit  Medication Sig Dispense Refill  . buPROPion (WELLBUTRIN XL) 300 MG 24 hr tablet TAKE ONE TABLET BY MOUTH DAILY 30 tablet 0  . busPIRone (BUSPAR) 15 MG tablet Take 1 tablet (15 mg total) by mouth 2 (two) times daily. 60 tablet 2  . cefdinir (OMNICEF) 300 MG capsule Take 300 mg by mouth 2 (two) times daily.    Marland Kitchen CIPRODEX OTIC suspension     . clonazePAM (KLONOPIN) 0.5 MG tablet TAKE ONE TABLET BY MOUTH THREE TIMES A DAY AS NEEDED FOR ANXIETY 80 tablet 0  . meclizine (ANTIVERT) 25 MG tablet Take 1 tablet (25 mg total) by mouth 3 (three) times daily as needed for dizziness. 30 tablet 0  . ondansetron (ZOFRAN-ODT) 4 MG disintegrating tablet Take 1 tablet every 8 hours as needed for migraine    . SUMAtriptan (IMITREX) 50 MG tablet May repeat in 2 hours if headache persists or recurs.(max 2 tabs in any 24 hours) 10 tablet 5  . neomycin-polymyxin-hydrocortisone (CORTISPORIN) 3.5-10000-1 OTIC suspension     . [DISCONTINUED] buPROPion (WELLBUTRIN XL) 300 MG 24 hr tablet TAKE ONE TABLET BY MOUTH DAILY 30 tablet 0   No current facility-administered  medications on file prior to visit.    There were no vitals taken for this visit.    Observations/Objective:   Gen: Awake, alert, no acute distress Resp: Breathing is even and non-labored Psych: calm/pleasant demeanor Neuro: Alert and Oriented x 3, + facial  symmetry, speech is clear.   Assessment and Plan:  Otalgia- Advised pt to start the ciprodex. Continue ceftin.  Call if symptoms worsen or if symptoms are not improved in 2-3 days.   20 minutes spent on today's visit.    Follow Up Instructions:    I discussed the assessment and treatment plan with the patient. The patient was provided an opportunity to ask questions and all were answered. The patient agreed with the plan and demonstrated an understanding of the instructions.   The patient was advised to call back or seek an in-person evaluation if the symptoms worsen or if the condition fails to improve as anticipated.  [name], NP

## 2019-06-12 ENCOUNTER — Other Ambulatory Visit: Payer: Self-pay | Admitting: Family

## 2019-06-20 ENCOUNTER — Encounter: Payer: Self-pay | Admitting: Family

## 2019-06-26 ENCOUNTER — Ambulatory Visit (INDEPENDENT_AMBULATORY_CARE_PROVIDER_SITE_OTHER): Payer: 59 | Admitting: Medical

## 2019-06-26 ENCOUNTER — Other Ambulatory Visit: Payer: Self-pay

## 2019-06-26 DIAGNOSIS — H6980 Other specified disorders of Eustachian tube, unspecified ear: Secondary | ICD-10-CM

## 2019-06-26 DIAGNOSIS — H6691 Otitis media, unspecified, right ear: Secondary | ICD-10-CM

## 2019-06-26 DIAGNOSIS — H9203 Otalgia, bilateral: Secondary | ICD-10-CM | POA: Diagnosis not present

## 2019-06-26 MED ORDER — AZITHROMYCIN 250 MG PO TABS: ORAL_TABLET | ORAL | 0 refills | Status: DC

## 2019-06-26 MED ORDER — CIPROFLOXACIN-DEXAMETHASONE 0.3-0.1 % OT SUSP: 4.0000 [drp] | Freq: Two times a day (BID) | OTIC | 0 refills | Status: DC

## 2019-06-26 MED ORDER — METHYLPREDNISOLONE 4 MG PO TABS: ORAL_TABLET | ORAL | 0 refills | Status: DC

## 2019-06-26 MED ORDER — METHYLPREDNISOLONE 4 MG PO TBPK: ORAL_TABLET | ORAL | 0 refills | Status: DC

## 2019-06-26 NOTE — Patient Instructions (Signed)
It appears you have some eustachian tube dysfunction post covid with mild otitis externa rt side and rt otitis media. Will rx ciprodex drops, 6 day taper medrol dose pack and azithromycin antibiotic.  Expect gradual improvement.   Ask to send my chart update in 7 days or sooner if needed.

## 2019-06-26 NOTE — Progress Notes (Signed)
   Subjective:    Patient ID: Shannon [name], female    DOB: February 24, 1961, 59 y.o.   MRN: [id number]  HPI Virtual Visit via Video Note  I connected with [name] on 06/26/19 at  2:00 PM EST by a video enabled telemedicine application and verified that I am speaking with the correct person using two identifiers.  Location: Patient: home Provider: office   I discussed the limitations of evaluation and management by telemedicine and the availability of in person appointments. The patient expressed understanding and agreed to proceed.  History of Present Illness:  Pt states had bilateral ear infection, otitis externa and bronchitis. Secondary to covid. She has static noise in ear. Sensitive itching ear lobes.   Pt tested + for covid June 09, 2019. Pt never was admitted to hospital.  In past pt used ciprodex  ear drops. She feels like no reaction. But she had side effect to cortisporin otic.  Of all of her symptoms presently are itching and trobbing to her ear. Pt had sores and scabs to ear lobes. Still some ear pressure. But no fever, no chills, no sweat or loss of smell.   Does have residual back aches post covid.    Observations/Objective: General- no acute distress, pleasant, alert and oriented. heent- pt did drive to office(saw near sidewalk). Left ear- no tragal tenderness. Left ear canal not swollen and normal tm. Rt side- no tragus tenderness. Canal looks mild swollen and central redness  Assessment and Plan: It appears you have some eustachian tube dysfunction post covid with mild otitis externa rt side and rt otitis media. Will rx ciprodex drops, 6 day taper medrol dose pack and azithromycin antibiotic.  Expect gradual improvement.   Ask to send my chart update in 7 days or sooner if needed.   Follow Up Instructions:    I discussed the assessment and treatment plan with the patient. The patient was provided an opportunity to ask questions and all were  answered. The patient agreed with the plan and demonstrated an understanding of the instructions.   The patient was advised to call back or seek an in-person evaluation if the symptoms worsen or if the condition fails to improve as anticipated.  I provided 25 minutes of non-face-to-face time during this encounter.   Mackie Pai, PA-C    Review of Systems     Objective:   Physical Exam        Assessment & Plan:

## 2019-07-02 ENCOUNTER — Telehealth: Payer: Self-pay | Admitting: Medical

## 2019-07-02 ENCOUNTER — Encounter: Payer: Self-pay | Admitting: Family

## 2019-07-02 DIAGNOSIS — H9201 Otalgia, right ear: Secondary | ICD-10-CM

## 2019-07-02 DIAGNOSIS — H9203 Otalgia, bilateral: Secondary | ICD-10-CM

## 2019-07-02 NOTE — Telephone Encounter (Signed)
Referral to ent placed.

## 2019-07-02 NOTE — Telephone Encounter (Signed)
Opened to review 

## 2019-07-03 NOTE — Telephone Encounter (Signed)
Opened to refer. 

## 2019-07-09 ENCOUNTER — Other Ambulatory Visit: Payer: Self-pay | Admitting: Family

## 2019-07-09 ENCOUNTER — Encounter: Payer: Self-pay | Admitting: Medical

## 2019-07-13 ENCOUNTER — Other Ambulatory Visit: Payer: Self-pay | Admitting: Family

## 2019-08-07 ENCOUNTER — Other Ambulatory Visit: Payer: Self-pay | Admitting: Family

## 2019-08-14 ENCOUNTER — Other Ambulatory Visit: Payer: Self-pay | Admitting: Family

## 2019-08-14 NOTE — Telephone Encounter (Signed)
Requesting:klonopin Contract:no UDS:n/a Last OV:06/26/19 Next OV:n/a Last Refill:07/02/19  #[id number] Database:   Please advise

## 2019-08-15 ENCOUNTER — Telehealth: Payer: Self-pay | Admitting: Family

## 2019-08-15 NOTE — Telephone Encounter (Signed)
Please contact pt to schedule follow up appointment (needs to update UDS/controlled substance contract for klonopin rx). I did send 1 refill but needs OV prior to future refills.

## 2019-08-15 NOTE — Telephone Encounter (Signed)
Patient was scheduled for 08-29-19 at 7 am

## 2019-08-29 ENCOUNTER — Other Ambulatory Visit: Payer: Self-pay

## 2019-08-29 ENCOUNTER — Encounter: Payer: Self-pay | Admitting: Family

## 2019-08-29 ENCOUNTER — Ambulatory Visit: Payer: 59 | Admitting: Family

## 2019-08-29 VITALS — BP 110/62 | HR 71 | Temp 97.6°F | Resp 16 | Ht 66.0 in | Wt 120.0 lb

## 2019-08-29 DIAGNOSIS — F419 Anxiety disorder, unspecified: Secondary | ICD-10-CM | POA: Diagnosis not present

## 2019-08-29 DIAGNOSIS — J329 Chronic sinusitis, unspecified: Secondary | ICD-10-CM | POA: Diagnosis not present

## 2019-08-29 DIAGNOSIS — Z79899 Other long term (current) drug therapy: Secondary | ICD-10-CM | POA: Diagnosis not present

## 2019-08-29 DIAGNOSIS — F324 Major depressive disorder, single episode, in partial remission: Secondary | ICD-10-CM

## 2019-08-29 DIAGNOSIS — G43809 Other migraine, not intractable, without status migrainosus: Secondary | ICD-10-CM

## 2019-08-29 MED ORDER — AMOXICILLIN-POT CLAVULANATE 875-125 MG PO TABS: 1.0000 | ORAL_TABLET | Freq: Two times a day (BID) | ORAL | 0 refills | Status: DC
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
## 2019-08-29 NOTE — Patient Instructions (Signed)
Please complete lab work prior to leaving.   

## 2019-08-30 LAB — PAIN MGMT, PROFILE 8 W/CONF, U
6 Acetylmorphine: NEGATIVE ng/mL
Alcohol Metabolites: NEGATIVE ng/mL (ref <500)
Amphetamines: NEGATIVE ng/mL
Benzodiazepines: NEGATIVE ng/mL
Buprenorphine, Urine: NEGATIVE ng/mL
Cocaine Metabolite: NEGATIVE ng/mL
Creatinine: 33.7 mg/dL
MDMA: NEGATIVE ng/mL
Marijuana Metabolite: NEGATIVE ng/mL
Opiates: NEGATIVE ng/mL
Oxidant: NEGATIVE ug/mL
Oxycodone: NEGATIVE ng/mL
pH: 6.4 (ref 4.5–9.0)

## 2019-09-06 ENCOUNTER — Other Ambulatory Visit: Payer: Self-pay | Admitting: Family

## 2019-09-12 ENCOUNTER — Other Ambulatory Visit: Payer: Self-pay | Admitting: Family

## 2019-10-05 ENCOUNTER — Other Ambulatory Visit: Payer: Self-pay | Admitting: Family

## 2019-10-16 ENCOUNTER — Other Ambulatory Visit: Payer: Self-pay | Admitting: Family

## 2019-11-05 ENCOUNTER — Other Ambulatory Visit: Payer: Self-pay | Admitting: Family

## 2019-11-16 ENCOUNTER — Other Ambulatory Visit: Payer: Self-pay | Admitting: Family

## 2019-11-19 ENCOUNTER — Encounter: Payer: Self-pay | Admitting: Family

## 2019-12-07 ENCOUNTER — Other Ambulatory Visit: Payer: Self-pay | Admitting: Family

## 2019-12-07 ENCOUNTER — Other Ambulatory Visit: Payer: Self-pay

## 2019-12-07 MED ORDER — BUSPIRONE HCL 15 MG PO TABS: 15.0000 mg | ORAL_TABLET | Freq: Two times a day (BID) | ORAL | 0 refills | Status: DC

## 2019-12-10 ENCOUNTER — Encounter: Payer: Self-pay | Admitting: Family

## 2019-12-11 ENCOUNTER — Other Ambulatory Visit: Payer: Self-pay | Admitting: Family

## 2019-12-12 NOTE — Telephone Encounter (Signed)
Requesting:klonpnin  Contract:09/05/19 UDS:08/29/19 Last Visit:08/29/19 Next Visit:01/18/20 Last Refill:77/16/21  Please Advise

## 2020-01-10 DIAGNOSIS — M189 Osteoarthritis of first carpometacarpal joint, unspecified: Secondary | ICD-10-CM

## 2020-01-10 HISTORY — DX: Osteoarthritis of first carpometacarpal joint, unspecified: M18.9

## 2020-01-17 ENCOUNTER — Other Ambulatory Visit: Payer: Self-pay | Admitting: Family

## 2020-01-18 ENCOUNTER — Encounter: Payer: 59 | Admitting: Family

## 2020-01-23 ENCOUNTER — Encounter: Payer: 59 | Admitting: Family

## 2020-01-24 ENCOUNTER — Other Ambulatory Visit: Payer: Self-pay | Admitting: Family

## 2020-02-25 ENCOUNTER — Other Ambulatory Visit: Payer: Self-pay | Admitting: Family

## 2020-03-03 ENCOUNTER — Other Ambulatory Visit: Payer: Self-pay | Admitting: Family

## 2020-03-03 DIAGNOSIS — M545 Low back pain, unspecified: Secondary | ICD-10-CM

## 2020-03-03 HISTORY — DX: Low back pain, unspecified: M54.50

## 2020-03-07 ENCOUNTER — Encounter: Payer: Self-pay | Admitting: Family

## 2020-03-07 ENCOUNTER — Encounter: Payer: 59 | Admitting: Family

## 2020-03-24 ENCOUNTER — Other Ambulatory Visit: Payer: Self-pay | Admitting: Family

## 2020-04-20 ENCOUNTER — Other Ambulatory Visit: Payer: Self-pay | Admitting: Family

## 2020-04-21 ENCOUNTER — Other Ambulatory Visit: Payer: Self-pay | Admitting: Family

## 2020-04-21 NOTE — Telephone Encounter (Signed)
Last written: 03/24/20 Last ov: 08/29/2019 Next ov: NONE Contract: 08/29/19 UDS: 12/26/2017

## 2020-05-03 HISTORY — PX: COLONOSCOPY: SHX174

## 2020-05-05 ENCOUNTER — Other Ambulatory Visit: Payer: Self-pay | Admitting: Family

## 2020-05-12 ENCOUNTER — Other Ambulatory Visit: Payer: Self-pay

## 2020-05-12 ENCOUNTER — Encounter: Payer: Self-pay | Admitting: Family

## 2020-05-12 ENCOUNTER — Ambulatory Visit: Payer: 59 | Admitting: Family

## 2020-05-12 VITALS — BP 126/78 | HR 83 | Temp 98.3°F | Resp 16 | Ht 66.0 in | Wt 114.6 lb

## 2020-05-12 DIAGNOSIS — F419 Anxiety disorder, unspecified: Secondary | ICD-10-CM | POA: Diagnosis not present

## 2020-05-12 DIAGNOSIS — F32A Depression, unspecified: Secondary | ICD-10-CM

## 2020-05-12 DIAGNOSIS — Z7185 Encounter for immunization safety counseling: Secondary | ICD-10-CM

## 2020-05-12 MED ORDER — BUPROPION HCL ER (XL) 300 MG PO TB24: 300.0000 mg | ORAL_TABLET | Freq: Every day | ORAL | 1 refills | Status: DC

## 2020-05-12 MED ORDER — BUSPIRONE HCL 15 MG PO TABS: 15.0000 mg | ORAL_TABLET | Freq: Two times a day (BID) | ORAL | 1 refills | Status: DC

## 2020-05-12 NOTE — Progress Notes (Signed)
Subjective:    Patient ID: Shannon [name], female    DOB: Oct 13, 1960, 60 y.o.   MRN: [id number]  HPI   Patient is a 60 year old female who presents today for follow up.  Anxiety/depression-the majority of her anxiety and depression symptoms that she is experiencing currently she attributes to stress in regards to her elderly parents.  She states that her mother is an alcoholic and is suffering from dementia.  States her mother will not follow through with neurology assessment and that her father is enabling her mother.  They are quite worried about her parents.  This is a major stressor for her herself and her sister.  She continues BuSpar and Wellbutrin.  She states that she is no longer having any constipation issues on BuSpar ever since she was placed on Linzess.  Chronic pain-she is following with orthopedics who is treating her with tramadol for right wrist pain and hip pain and low back pain. Sees Dr. Nelva Bush (back/hip) and Dr. Amedeo Plenty for her wrist.  She states that she needs a joint replacement in her wrist at some point.  She uses the tramadol as needed for pain.  Review of Systems Past Medical History:  Diagnosis Date  . Anemia   . Anxiety   . Depression   . Eating disorder    anorexia nervosa in her 20's  . Fainting spell   . Hx of laminectomy 1994   L4-5  . Kidney stones    passed on their own  . Migraine   . Stomach disorder    due to complications with cholecystectomy "almost died"     Social History   Socioeconomic History  . Marital status: Married    Spouse name: Not on file  . Number of children: 3  . Years of education: Not on file  . Highest education level: Not on file  Occupational History    Employer: SELF EMPLOYED  Tobacco Use  . Smoking status: Never Smoker  . Smokeless tobacco: Never Used  Substance and Sexual Activity  . Alcohol use: No  . Drug use: Yes  . Sexual activity: Yes    Partners: Male    Birth control/protection: Surgical  Other  Topics Concern  . Not on file  Social History Narrative   Regular exercise:  Yes (swim instructor)   Caffeine Use:  1 daily   Married 3 children ages 59 son, 3 son, and 49 daughter   Insurance account manager degree               Social Determinants of Radio broadcast assistant Strain: Not on file  Food Insecurity: Not on file  Transportation Needs: Not on file  Physical Activity: Not on file  Stress: Not on file  Social Connections: Not on file  Intimate Partner Violence: Not on file    Past Surgical History:  Procedure Laterality Date  . APPENDECTOMY  1976  . BREAST BIOPSY Right   . CHOLECYSTECTOMY  08/2009   reports history of gallbladder polyps, she reports stents in duct of lushca   . HYSTEROSCOPY  07/2011  . LAMINECTOMY  1994   L4-5  . TONSILLECTOMY  1982  . TUBAL LIGATION      Family History  Problem Relation Age of Onset  . Hypertension Mother   . Memory loss Mother   . Diabetes Father   . Hypertension Father   . Hypothyroidism Father   . CAD Father   . Alcohol abuse Maternal Grandmother   . Cancer  Paternal Grandfather        lung cancer  . Hypothyroidism Other   . Hashimoto's thyroiditis Sister   . Hypothyroidism Sister   . Neuropathy Sister        chronic inflammatory demyelinating peripheral neuropathy from lyme disease    Allergies  Allergen Reactions  . Buprenorphine Hcl     Other reaction(s): Other (See Comments) No relief  . Buspar [Buspirone]     constipation  . Cortisporin [Neomycin-Polymyxin-Hc] Swelling    Ear dropps  . Eszopiclone     Headaches  . Flexeril [Cyclobenzaprine Hcl] Other (See Comments)    Mood swings  . Gabapentin     sedation  . Morphine And Related Other (See Comments)    No relief  . Nsaids Other (See Comments)    GI bleed  . Paroxetine Nausea Only       . Shellfish Allergy Other (See Comments)    skin & mouth tingle  . Tizanidine Hcl Other (See Comments)    Dizziness, mood swings  . Tolmetin     Other  reaction(s): Other (See Comments) GI bleed    Current Outpatient Medications on File Prior to Visit  Medication Sig Dispense Refill  . buPROPion (WELLBUTRIN XL) 300 MG 24 hr tablet Take 1 tablet (300 mg total) by mouth daily. 30 tablet 1  . busPIRone (BUSPAR) 15 MG tablet Take 1 tablet (15 mg total) by mouth 2 (two) times daily. 60 tablet 0  . clonazePAM (KLONOPIN) 0.5 MG tablet TAKE ONE TABLET BY MOUTH THREE TIMES A DAY AS NEEDED FOR ANXIETY 80 tablet 0  . traMADol (ULTRAM) 50 MG tablet tramadol 50 mg tablet  TAKE ONE TABLET BY MOUTH EVERY 4 TO 6 HOURS AS NEEDED     No current facility-administered medications on file prior to visit.    BP 126/78 (BP Location: Right Arm, Patient Position: Sitting, Cuff Size: Small)   Pulse 83   Temp 98.3 F (36.8 C) (Oral)   Resp 16   Ht 5\' 6"  (1.676 m)   Wt 114 lb 9.6 oz (52 kg)   SpO2 100%   BMI 18.50 kg/m       Objective:   Physical Exam Constitutional:      Appearance: Normal appearance.  Neurological:     Mental Status: She is alert.  Psychiatric:        Attention and Perception: Attention and perception normal.        Mood and Affect: Mood normal.        Speech: Speech normal.        Behavior: Behavior normal.        Cognition and Memory: Cognition normal.           Assessment & Plan:  Depression/anxiety- she is having a lot of situational stressors.  Support provided.  We will continue Wellbutrin and BuSpar.  She also has clonazepam on hand for as needed use.  Her controlled substance contract is updated today.  Depression screen [employer] 2/9 05/12/2020 09/19/2018 08/09/2018 09/13/2017 09/13/2017  Decreased Interest 2 1 1  0 0  Down, Depressed, Hopeless 3 0 1 0 0  PHQ - 2 Score 5 1 2  0 0  Altered sleeping 3 1 3  - -  Tired, decreased energy 2 0 0 - -  Change in appetite 3 3 0 - -  Feeling bad or failure about yourself  0 1 2 - -  Trouble concentrating 0 3 0 - -  Moving slowly or fidgety/restless  0 0 0 - -  Suicidal thoughts 0 0  0 - -  PHQ-9 Score 13 9 7  - -  Difficult doing work/chores Not difficult at all Somewhat difficult Somewhat difficult - -  Some recent data might be hidden   GAD 7 : Generalized Anxiety Score 05/12/2020 09/19/2018 08/09/2018  Nervous, Anxious, on Edge 3 3 3   Control/stop worrying 3 3 3   Worry too much - different things 2 3 3   Trouble relaxing 2 3 3   Restless 2 3 3   Easily annoyed or irritable 0 0 0  Afraid - awful might happen 3 3 1   Total GAD 7 Score 15 18 16   Anxiety Difficulty Not difficult at all Very difficult Very difficult      Immunization counseling-patient counseled on the importance of COVID-19 immunization.  She states she is not ready at this time.  20 minutes spent on today's visit.  This visit occurred during the SARS-CoV-2 public health emergency.  Safety protocols were in place, including screening questions prior to the visit, additional usage of staff PPE, and extensive cleaning of exam room while observing appropriate contact time as indicated for disinfecting solutions.

## 2020-05-13 ENCOUNTER — Encounter: Payer: 59 | Admitting: Family

## 2020-05-21 ENCOUNTER — Other Ambulatory Visit: Payer: Self-pay | Admitting: Family

## 2020-05-21 NOTE — Telephone Encounter (Signed)
Requesting: clonazepam 0.5mg  Contract: 08/29/2019 UDS: 08/29/2019 Last Visit: 05/12/20 Next Visit: None Last Refill: 04/21/2020 #60 and 0RF  Please Advise

## 2020-06-15 ENCOUNTER — Encounter: Payer: Self-pay | Admitting: Family

## 2020-06-15 DIAGNOSIS — R413 Other amnesia: Secondary | ICD-10-CM

## 2020-06-16 ENCOUNTER — Encounter: Payer: Self-pay | Admitting: Neurology

## 2020-06-20 ENCOUNTER — Other Ambulatory Visit: Payer: Self-pay | Admitting: Family

## 2020-06-20 NOTE — Telephone Encounter (Signed)
Requesting: clonazepam 0.5mg  Contract: 08/29/2019 UDS: 08/29/2019 Last Visit: 05/12/2020 Next Visit: None Last Refill: 05/21/2020 #80 and 0RF Pt sig: 1 tab tid prn  Please Advise

## 2020-07-20 ENCOUNTER — Other Ambulatory Visit: Payer: Self-pay | Admitting: Family

## 2020-07-21 NOTE — Telephone Encounter (Signed)
Requesting: clonazepam 0.5mg   Contract: 08/29/2019 UDS: 08/29/2019 Last Visit: 05/12/2020 Next Visit: None Last Refill: 06/20/2020 #80 and 0RF  Please Advise

## 2020-08-21 ENCOUNTER — Encounter: Payer: Self-pay | Admitting: Family

## 2020-08-21 ENCOUNTER — Other Ambulatory Visit: Payer: Self-pay | Admitting: Family

## 2020-08-21 NOTE — Telephone Encounter (Signed)
Last RX:05-21-20 Last OV:05-21-20 Next OV: none scheduled UDS:05-21-2020 CSC:08-29-2019 Request for Clonazepam 0.5 mg

## 2020-09-22 ENCOUNTER — Other Ambulatory Visit: Payer: Self-pay | Admitting: Family

## 2020-09-22 NOTE — Telephone Encounter (Signed)
Patient is requesting a refill of the following medications: Requested Prescriptions   Pending Prescriptions Disp Refills   clonazePAM (KLONOPIN) 0.5 MG tablet [Pharmacy Med Name: clonazePAM 0.5 MG TABLET] 80 tablet     Sig: TAKE ONE TABLET BY MOUTH THREE TIMES A DAY AS NEEDED FOR ANXIETY    Date of patient request: 09/22/20 Last office visit: 05/12/20 Date of last refill: 08/22/20 Last refill amount: 80 +0 Follow up time period per chart: No F/u

## 2020-10-01 ENCOUNTER — Ambulatory Visit: Payer: 59 | Admitting: Neurology

## 2020-10-01 ENCOUNTER — Other Ambulatory Visit (INDEPENDENT_AMBULATORY_CARE_PROVIDER_SITE_OTHER): Payer: 59

## 2020-10-01 ENCOUNTER — Encounter: Payer: Self-pay | Admitting: Neurology

## 2020-10-01 ENCOUNTER — Other Ambulatory Visit: Payer: Self-pay

## 2020-10-01 VITALS — BP 121/81 | HR 75 | Ht 66.0 in | Wt 116.4 lb

## 2020-10-01 DIAGNOSIS — R413 Other amnesia: Secondary | ICD-10-CM | POA: Diagnosis not present

## 2020-10-01 LAB — TSH: TSH: 1.12 u[IU]/mL (ref 0.35–4.50)

## 2020-10-01 LAB — VITAMIN B12: Vitamin B-12: 308 pg/mL (ref 211–911)

## 2020-10-01 NOTE — Patient Instructions (Signed)
Good to meet you!  1. Bloodwork for TSH, B12  2. Schedule Neurocognitive testing  3. Follow-up in 1 year, call for any changes   RECOMMENDATIONS FOR ALL PATIENTS WITH MEMORY PROBLEMS: 1. Continue to exercise (Recommend 30 minutes of walking everyday, or 3 hours every week) 2. Increase social interactions - continue going to [place] and enjoy social gatherings with friends and family 3. Eat healthy, avoid fried foods and eat more fruits and vegetables 4. Maintain adequate blood pressure, blood sugar, and blood cholesterol level. Reducing the risk of stroke and cardiovascular disease also helps promoting better memory. 5. Avoid stressful situations. Live a simple life and avoid aggravations. Organize your time and prepare for the next day in anticipation. 6. Sleep well, avoid any interruptions of sleep and avoid any distractions in the bedroom that may interfere with adequate sleep quality 7. Avoid sugar, avoid sweets as there is a strong link between excessive sugar intake, diabetes, and cognitive impairment We discussed the Mediterranean diet, which has been shown to help patients reduce the risk of progressive memory disorders and reduces cardiovascular risk. This includes eating fish, eat fruits and green leafy vegetables, nuts like almonds and hazelnuts, walnuts, and also use olive oil. Avoid fast foods and fried foods as much as possible. Avoid sweets and sugar as sugar use has been linked to worsening of memory function.       Mediterranean Diet  Why follow it? Research shows. . Those who follow the Mediterranean diet have a reduced risk of heart disease  . The diet is associated with a reduced incidence of Parkinson's and Alzheimer's diseases . People following the diet may have longer life expectancies and lower rates of chronic diseases  . The Dietary Guidelines for Americans recommends the Mediterranean diet as an eating plan to promote health and prevent disease  What Is the  Mediterranean Diet?  . Healthy eating plan based on typical foods and recipes of Mediterranean-style cooking . The diet is primarily a plant based diet; these foods should make up a majority of meals   Starches - Plant based foods should make up a majority of meals - They are an important sources of vitamins, minerals, energy, antioxidants, and fiber - Choose whole grains, foods high in fiber and minimally processed items  - Typical grain sources include wheat, oats, barley, corn, brown rice, bulgar, farro, millet, polenta, couscous  - Various types of beans include chickpeas, lentils, fava beans, black beans, white beans   Fruits  Veggies - Large quantities of antioxidant rich fruits & veggies; 6 or more servings  - Vegetables can be eaten raw or lightly drizzled with oil and cooked  - Vegetables common to the traditional Mediterranean Diet include: artichokes, arugula, beets, broccoli, brussel sprouts, cabbage, carrots, celery, collard greens, cucumbers, eggplant, kale, leeks, lemons, lettuce, mushrooms, okra, onions, peas, peppers, potatoes, pumpkin, radishes, rutabaga, shallots, spinach, sweet potatoes, turnips, zucchini - Fruits common to the Mediterranean Diet include: apples, apricots, avocados, cherries, clementines, dates, figs, grapefruits, grapes, melons, nectarines, oranges, peaches, pears, pomegranates, strawberries, tangerines  Fats - Replace butter and margarine with healthy oils, such as olive oil, canola oil, and tahini  - Limit nuts to no more than a handful a day  - Nuts include walnuts, almonds, pecans, pistachios, pine nuts  - Limit or avoid candied, honey roasted or heavily salted nuts - Olives are central to the Mediterranean diet - can be eaten whole or used in a variety of dishes   Meats Protein -  Limiting red meat: no more than a few times a month - When eating red meat: choose lean cuts and keep the portion to the size of deck of cards - Eggs: approx. 0 to 4 times a  week  - Fish and lean poultry: at least 2 a week  - Healthy protein sources include, chicken, [place], lean beef, lamb - Increase intake of seafood such as tuna, salmon, trout, mackerel, shrimp, scallops - Avoid or limit high fat processed meats such as sausage and bacon  Dairy - Include moderate amounts of low fat dairy products  - Focus on healthy dairy such as fat free yogurt, skim milk, low or reduced fat cheese - Limit dairy products higher in fat such as whole or 2% milk, cheese, ice cream  Alcohol - Moderate amounts of red wine is ok  - No more than 5 oz daily for women (all ages) and men older than age 96  - No more than 10 oz of wine daily for men younger than 10  Other - Limit sweets and other desserts  - Use herbs and spices instead of salt to flavor foods  - Herbs and spices common to the traditional Mediterranean Diet include: basil, bay leaves, chives, cloves, cumin, fennel, garlic, lavender, marjoram, mint, oregano, parsley, pepper, rosemary, sage, savory, sumac, tarragon, thyme   It's not just a diet, it's a lifestyle:  . The Mediterranean diet includes lifestyle factors typical of those in the region  . Foods, drinks and meals are best eaten with others and savored . Daily physical activity is important for overall good health . This could be strenuous exercise like running and aerobics . This could also be more leisurely activities such as walking, housework, yard-work, or taking the stairs . Moderation is the key; a balanced and healthy diet accommodates most foods and drinks . Consider portion sizes and frequency of consumption of certain foods   Meal Ideas & Options:  . Breakfast:  o Whole wheat toast or whole wheat English muffins with peanut butter & hard boiled egg o Steel cut oats topped with apples & cinnamon and skim milk  o Fresh fruit: banana, strawberries, melon, berries, peaches  o Smoothies: strawberries, bananas, greek yogurt, peanut butter o Low fat  greek yogurt with blueberries and granola  o Egg white omelet with spinach and mushrooms o Breakfast couscous: whole wheat couscous, apricots, skim milk, cranberries  . Sandwiches:  o Hummus and grilled vegetables (peppers, zucchini, squash) on whole wheat bread   o Grilled chicken on whole wheat pita with lettuce, tomatoes, cucumbers or tzatziki  o Tuna salad on whole wheat bread: tuna salad made with greek yogurt, olives, red peppers, capers, green onions o Garlic rosemary lamb pita: lamb sauted with garlic, rosemary, salt & pepper; add lettuce, cucumber, greek yogurt to pita - flavor with lemon juice and black pepper  . Seafood:  o Mediterranean grilled salmon, seasoned with garlic, basil, parsley, lemon juice and black pepper o Shrimp, lemon, and spinach whole-grain pasta salad made with low fat greek yogurt  o Seared scallops with lemon orzo  o Seared tuna steaks seasoned salt, pepper, coriander topped with tomato mixture of olives, tomatoes, olive oil, minced garlic, parsley, green onions and cappers  . Meats:  o Herbed greek chicken salad with kalamata olives, cucumber, feta  o Red bell peppers stuffed with spinach, bulgur, lean ground beef (or lentils) & topped with feta   o Kebabs: skewers of chicken, tomatoes, onions, zucchini, squash  o [place] burgers: made with red onions, mint, dill, lemon juice, feta cheese topped with roasted red peppers . Vegetarian o Cucumber salad: cucumbers, artichoke hearts, celery, red onion, feta cheese, tossed in olive oil & lemon juice  o Hummus and whole grain pita points with a greek salad (lettuce, tomato, feta, olives, cucumbers, red onion) o Lentil soup with celery, carrots made with vegetable broth, garlic, salt and pepper  o Tabouli salad: parsley, bulgur, mint, scallions, cucumbers, tomato, radishes, lemon juice, olive oil, salt and pepper.

## 2020-10-01 NOTE — Progress Notes (Signed)
NEUROLOGY CONSULTATION NOTE  Shannon [name] MRN: [id number] DOB: 06-Nov-1960  Referring provider: [name], NP Primary care provider: [name], NP  Reason for consult:  Memory loss  Thank you for your kind referral of Elke Holtry for consultation of the above symptoms. Although her history is well known to you, please allow me to reiterate it for the purpose of our medical record. She is alone in the office today. Records and images were personally reviewed where available.   HISTORY OF PRESENT ILLNESS: This is a pleasant 60 year old right-handed woman with a history of remote migraines, back surgery, anxiety, depression, presenting for evaluation of memory loss. She feels her memory is "sometimes okay, other times not okay." She lives with her husband, family have sometimes made comments that she just asked the same question or they had just told her this. She denies getting lost driving. She denies missing medications or bill payments. She denies misplacing things frequently or leaving the stove on. She states there is a lot going on, her mother has symptoms suggestive of dementia and refuses care, and because of family history, she is concerned for herself. She cares for both parents who need help, seeing them daily. She has a lot of panic attacks and takes clonazepam 2-3 times a day. She has mild headaches that she attributes to stress. She has occasional swallowing difficulties that she feels is anxiety-related. She has intermittent tingling in both feet and low back pain. No dizziness, diplopia, dysarthria, neck pain, bowel/bladder dysfunction, anosmia, or tremors. Aside from her mother, her father is also having memory changes since a stroke. She has had at least 3 concussions with loss of consciousness, most recently 3 years ago. She drinks alcohol socially. She is very active, exercising regularly, teaches swimming. She states she has held it together through all  this.    Laboratory Data: Lab Results  Component Value Date   TSH 1.33 05/05/2016   Lab Results  Component Value Date   [id number] 02/19/2013    PAST MEDICAL HISTORY: Past Medical History:  Diagnosis Date  . Anemia   . Anxiety   . Depression   . Eating disorder    anorexia nervosa in her 20's  . Fainting spell   . Hx of laminectomy 1994   L4-5  . Kidney stones    passed on their own  . Migraine   . Stomach disorder    due to complications with cholecystectomy "almost died"    PAST SURGICAL HISTORY: Past Surgical History:  Procedure Laterality Date  . APPENDECTOMY  1976  . BREAST BIOPSY Right   . CHOLECYSTECTOMY  08/2009   reports history of gallbladder polyps, she reports stents in duct of lushca   . HYSTEROSCOPY  07/2011  . LAMINECTOMY  1994   L4-5  . TONSILLECTOMY  1982  . TUBAL LIGATION      MEDICATIONS: Current Outpatient Medications on File Prior to Visit  Medication Sig Dispense Refill  . buPROPion (WELLBUTRIN XL) 300 MG 24 hr tablet Take 1 tablet (300 mg total) by mouth daily. 90 tablet 1  . busPIRone (BUSPAR) 15 MG tablet Take 1 tablet (15 mg total) by mouth 2 (two) times daily. 180 tablet 1  . clonazePAM (KLONOPIN) 0.5 MG tablet TAKE ONE TABLET BY MOUTH THREE TIMES A DAY AS NEEDED FOR ANXIETY 80 tablet 0  . LINZESS 290 MCG CAPS capsule Take 290 mcg by mouth daily.    . traMADol (ULTRAM) 50 MG tablet tramadol  50 mg tablet  TAKE ONE TABLET BY MOUTH EVERY 4 TO 6 HOURS AS NEEDED     No current facility-administered medications on file prior to visit.    ALLERGIES: Allergies  Allergen Reactions  . Buprenorphine Hcl     Other reaction(s): Other (See Comments) No relief  . Cortisporin [Neomycin-Polymyxin-Hc] Swelling    Ear dropps  . Eszopiclone     Headaches  . Flexeril [Cyclobenzaprine Hcl] Other (See Comments)    Mood swings  . Gabapentin     sedation  . Morphine And Related Other (See Comments)    No relief  . Nsaids Other (See  Comments)    GI bleed  . Paroxetine Nausea Only       . Shellfish Allergy Other (See Comments)    skin & mouth tingle  . Tizanidine Hcl Other (See Comments)    Dizziness, mood swings  . Tolmetin     Other reaction(s): Other (See Comments) GI bleed    FAMILY HISTORY: Family History  Problem Relation Age of Onset  . Hypertension Mother   . Memory loss Mother   . Diabetes Father   . Hypertension Father   . Hypothyroidism Father   . CAD Father   . Alcohol abuse Maternal Grandmother   . Cancer Paternal Grandfather        lung cancer  . Hypothyroidism Other   . Hashimoto's thyroiditis Sister   . Hypothyroidism Sister   . Neuropathy Sister        chronic inflammatory demyelinating peripheral neuropathy from lyme disease    SOCIAL HISTORY: Social History   Socioeconomic History  . Marital status: Married    Spouse name: Not on file  . Number of children: 3  . Years of education: Not on file  . Highest education level: Not on file  Occupational History    Employer: SELF EMPLOYED  Tobacco Use  . Smoking status: Never Smoker  . Smokeless tobacco: Never Used  Vaping Use  . Vaping Use: Never used  Substance and Sexual Activity  . Alcohol use: Yes    Comment: occ.  . Drug use: Not Currently  . Sexual activity: Yes    Partners: Male    Birth control/protection: Surgical  Other Topics Concern  . Not on file  Social History Narrative   Regular exercise:  Yes (swim instructor)   Caffeine Use:  1 daily   Married 3 children ages 67 son, 40 son, and 32 daughter   Associates degree   Right handed          Social Determinants of Health   Financial Resource Strain: Not on file  Food Insecurity: Not on file  Transportation Needs: Not on file  Physical Activity: Not on file  Stress: Not on file  Social Connections: Not on file  Intimate Partner Violence: Not on file     PHYSICAL EXAM: Vitals:   10/01/20 1025  BP: 121/81  Pulse: 75  SpO2: 100%   General: No  acute distress Head:  Normocephalic/atraumatic Skin/Extremities: No rash, no edema Neurological Exam: Mental status: alert and oriented to person, place, and time, no dysarthria or aphasia, Fund of knowledge is appropriate.  Recent and remote memory are intact.  Attention and concentration are normal.    Able to name objects and repeat phrases. Day Cognitive Assessment  10/01/2020  Visuospatial/ Executive (0/5) 5  Naming (0/3) 3  Attention: Read list of digits (0/2) 2  Attention: Read list of letters (0/1) 1  Attention: Serial 7 subtraction starting at 100 (0/3) 3  Language: Repeat phrase (0/2) 1  Language : Fluency (0/1) 1  Abstraction (0/2) 2  Delayed Recall (0/5) 3  Orientation (0/6) 6  Total 27    Cranial nerves: CN I: not tested CN II: pupils equal, round and reactive to light, visual fields intact CN III, IV, VI:  full range of motion, no nystagmus, no ptosis CN V: facial sensation intact CN VII: upper and lower face symmetric CN VIII: hearing intact to conversation CN XI: sternocleidomastoid and trapezius muscles intact CN XII: tongue midline Bulk & Tone: normal, no fasciculations. Motor: 5/5 throughout with no pronator drift. Sensation: intact to light touch, cold, pin, vibration sense.  No extinction to double simultaneous stimulation.  Romberg test negative Deep Tendon Reflexes: +2 throughout Cerebellar: no incoordination on finger to nose testing Gait: narrow-based and steady, able to tandem walk adequately. Tremor: none   IMPRESSION: This is a pleasant 60 year old right-handed woman with a history of remote migraines, back surgery, anxiety, depression, presenting for evaluation of memory loss. Her neurological exam is normal, MOCA score today normal 27/30. She is understandably concerned about memory changes due to her mother's history and wants to see if there is any test or anything she can do early on. We discussed different causes of memory loss,  check TSH and B12. We discussed how anxiety, stress, mood, can also affect memory. She will be scheduled for Neurocognitive evaluation to establish a baseline. We discussed the importance of control of vascular risk factors, physical exercise, MIND diet, brain stimulation exercises, for brain health. Follow-up in 1 year, call for any changes.    Thank you for allowing me to participate in the care of this patient. Please do not hesitate to call for any questions or concerns.   Ellouise Newer, M.D.  CC: [name], NP

## 2020-10-28 ENCOUNTER — Other Ambulatory Visit: Payer: Self-pay | Admitting: Family

## 2020-11-25 ENCOUNTER — Other Ambulatory Visit: Payer: Self-pay | Admitting: Family

## 2020-11-25 NOTE — Telephone Encounter (Signed)
Refill sent. Please contact pt to schedule a follow up appointment.

## 2020-11-25 NOTE — Telephone Encounter (Signed)
Requesting: clonazepam Contract: 05/12/20 UDS: 08/29/19 Last Visit: 05/12/20 Next Visit: none  Last Refill: 10/28/20  Please Advise

## 2020-12-04 ENCOUNTER — Ambulatory Visit (INDEPENDENT_AMBULATORY_CARE_PROVIDER_SITE_OTHER): Payer: 59 | Admitting: Psychology

## 2020-12-04 ENCOUNTER — Ambulatory Visit: Payer: 59

## 2020-12-04 ENCOUNTER — Encounter: Payer: Self-pay | Admitting: Psychology

## 2020-12-04 ENCOUNTER — Other Ambulatory Visit: Payer: Self-pay

## 2020-12-04 DIAGNOSIS — R4189 Other symptoms and signs involving cognitive functions and awareness: Secondary | ICD-10-CM

## 2020-12-04 DIAGNOSIS — F410 Panic disorder [episodic paroxysmal anxiety] without agoraphobia: Secondary | ICD-10-CM | POA: Diagnosis not present

## 2020-12-04 DIAGNOSIS — F332 Major depressive disorder, recurrent severe without psychotic features: Secondary | ICD-10-CM | POA: Diagnosis not present

## 2020-12-04 DIAGNOSIS — F411 Generalized anxiety disorder: Secondary | ICD-10-CM | POA: Diagnosis not present

## 2020-12-04 NOTE — Progress Notes (Signed)
   Psychometrician Note   Cognitive testing was administered to Shannon [name] by Cruzita Lederer, B.S. (psychometrist) under the supervision of Dr. Christia Reading, Ph.D., licensed psychologist on 12/04/2020. Shannon [name] did not appear overtly distressed by the testing session per behavioral observation or responses across self-report questionnaires. Rest breaks were offered.    The battery of tests administered was selected by Dr. Christia Reading, Ph.D. with consideration to Shannon [name]'s current level of functioning, the nature of her symptoms, emotional and behavioral responses during interview, level of literacy, observed level of motivation/effort, and the nature of the referral question. This battery was communicated to the psychometrist. Communication between Dr. Christia Reading, Ph.D. and the psychometrist was ongoing throughout the evaluation and Dr. Christia Reading, Ph.D. was immediately accessible at all times. Dr. Christia Reading, Ph.D. provided supervision to the psychometrist on the date of this service to the extent necessary to assure the quality of all services provided.    Shannon [name] will return within approximately 1-2 weeks for an interactive feedback session with Dr. Melvyn Novas at which time her test performances, clinical impressions, and treatment recommendations will be reviewed in detail. Shannon [name] understands she can contact our office should she require our assistance before this time.  A total of 165 minutes of billable time were spent face-to-face with Shannon [name] by the psychometrist. This includes both test administration and scoring time. Billing for these services is reflected in the clinical report generated by Dr. Christia Reading, Ph.D.  This note reflects time spent with the psychometrician and does not include test scores or any clinical interpretations made by Dr. Melvyn Novas. The full report will follow in a separate note.

## 2020-12-04 NOTE — Progress Notes (Signed)
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 questions and receive answers about the evaluation. Oral consent to participate was provided by the patient.   This evaluation was conducted by Christia Reading, Ph.D., licensed clinical neuropsychologist. Ms. Niess completed a clinical interview with Dr. Melvyn Novas, billed as one unit [phone], and 165 minutes of cognitive testing and scoring, billed as one unit [phone] and five additional units [id number]. Psychometrist Cruzita Lederer, B.S., assisted Dr. Melvyn Novas with test administration and scoring procedures. As a separate and discrete service, Dr. Melvyn Novas spent a total of 160 minutes in interpretation and report writing billed as one unit [phone] and two units [id number].

## 2020-12-10 NOTE — Addendum Note (Signed)
Addended by: Hazle Coca C on: 12/10/2020 08:19 AM   Modules accepted: Orders

## 2020-12-15 ENCOUNTER — Ambulatory Visit (INDEPENDENT_AMBULATORY_CARE_PROVIDER_SITE_OTHER): Payer: 59 | Admitting: Psychology

## 2020-12-15 ENCOUNTER — Other Ambulatory Visit: Payer: Self-pay

## 2020-12-15 DIAGNOSIS — F411 Generalized anxiety disorder: Secondary | ICD-10-CM | POA: Diagnosis not present

## 2020-12-15 DIAGNOSIS — R4189 Other symptoms and signs involving cognitive functions and awareness: Secondary | ICD-10-CM | POA: Diagnosis not present

## 2020-12-15 DIAGNOSIS — F410 Panic disorder [episodic paroxysmal anxiety] without agoraphobia: Secondary | ICD-10-CM | POA: Diagnosis not present

## 2020-12-15 DIAGNOSIS — F332 Major depressive disorder, recurrent severe without psychotic features: Secondary | ICD-10-CM

## 2020-12-15 NOTE — Progress Notes (Signed)
   Neuropsychology Feedback Session Shannon [name]. [facility]  Reason for Referral:   Shannon [name] is a 60 y.o. right-handed Caucasian female referred by Ellouise Newer, M.D., to characterize her current cognitive functioning and assist with diagnostic clarity and treatment planning in the context of subjective cognitive decline and a family history of neurodegenerative illness.   Feedback:   Shannon [name] completed a comprehensive neuropsychological evaluation on 12/04/2020. Please refer to that encounter for the full report and recommendations. Briefly, results suggested variability across aspects of new learning and memory. A relative weakness was exhibited learning and later retrieving verbal information relative to visual information. However, it is very important to highlight that, during memory testing, Shannon [name] was noted to experience significant anxiety to the extent that the evaluation was briefly stopped and she took anxiety medication (i.e., clonazepam). As a whole, I do not feel that testing suggests an ongoing memory impairment. This belief is bolstered by the fact that Shannon [name] had retention scores ranging from 86% to [id number], which certainly does not suggest a deficit with information storage or rapid forgetting. Regarding psychological functioning, Shannon [name] reported acute symptoms of severe anxiety and severe depression across related questionnaires.  Shannon [name] was unaccompanied during the current feedback session. Content of the current session focused on the results of her neuropsychological evaluation. Shannon [name] was given the opportunity to ask questions and her questions were answered. She was encouraged to reach out should additional questions arise. A copy of her report was provided at the conclusion of the visit.      20 minutes were spent conducting the current feedback session with Shannon [name], billed as one unit [phone].

## 2020-12-24 ENCOUNTER — Other Ambulatory Visit: Payer: Self-pay | Admitting: Family

## 2020-12-24 ENCOUNTER — Telehealth: Payer: Self-pay

## 2020-12-24 ENCOUNTER — Encounter: Payer: Self-pay | Admitting: Family

## 2020-12-24 MED ORDER — CLONAZEPAM 0.5 MG PO TABS: 0.5000 mg | ORAL_TABLET | Freq: Three times a day (TID) | ORAL | 0 refills | Status: DC | PRN

## 2020-12-24 NOTE — Telephone Encounter (Signed)
Requesting: clonazepam Contract: 05/12/20 UDS: 08/29/19 Last Visit: 05/12/20 Next Visit: none  Last Refill: 11/25/20  Called patient to schedule appointment but no answer. MyChart message sent to her advising to call asap for follow up appointment.

## 2020-12-29 ENCOUNTER — Ambulatory Visit: Payer: 59 | Admitting: Family

## 2020-12-29 ENCOUNTER — Other Ambulatory Visit: Payer: Self-pay

## 2020-12-29 ENCOUNTER — Ambulatory Visit (HOSPITAL_BASED_OUTPATIENT_CLINIC_OR_DEPARTMENT_OTHER)
Admission: RE | Admit: 2020-12-29 | Discharge: 2020-12-29 | Disposition: A | Discharge status: Home / Self Care | Payer: 59 | Source: Ambulatory Visit | Attending: Family | Admitting: Family

## 2020-12-29 ENCOUNTER — Encounter (HOSPITAL_BASED_OUTPATIENT_CLINIC_OR_DEPARTMENT_OTHER): Payer: Self-pay

## 2020-12-29 VITALS — BP 120/80 | HR 78 | Temp 98.4°F | Resp 16 | Ht 66.0 in | Wt 117.0 lb

## 2020-12-29 DIAGNOSIS — F419 Anxiety disorder, unspecified: Secondary | ICD-10-CM

## 2020-12-29 DIAGNOSIS — Z0000 Encounter for general adult medical examination without abnormal findings: Secondary | ICD-10-CM

## 2020-12-29 DIAGNOSIS — F411 Generalized anxiety disorder: Secondary | ICD-10-CM | POA: Diagnosis not present

## 2020-12-29 DIAGNOSIS — F410 Panic disorder [episodic paroxysmal anxiety] without agoraphobia: Secondary | ICD-10-CM

## 2020-12-29 DIAGNOSIS — F332 Major depressive disorder, recurrent severe without psychotic features: Secondary | ICD-10-CM | POA: Diagnosis not present

## 2020-12-29 DIAGNOSIS — Z1231 Encounter for screening mammogram for malignant neoplasm of breast: Secondary | ICD-10-CM | POA: Diagnosis present

## 2020-12-29 MED ORDER — BUSPIRONE HCL 15 MG PO TABS: 15.0000 mg | ORAL_TABLET | Freq: Two times a day (BID) | ORAL | 1 refills | Status: DC

## 2020-12-29 NOTE — Patient Instructions (Signed)
Please complete lab work prior to leaving.   

## 2020-12-29 NOTE — Assessment & Plan Note (Signed)
>>  ASSESSMENT AND PLAN FOR MAJOR DEPRESSIVE DISORDER WRITTEN ON 12/29/2020  7:49 AM BY O'SULLIVAN, Shannon Harewood, NP  Uncontrolled. She does not wish to change her medication at this time and is not currently following with psychiatry. She does have an appointment scheduled with a new counselor. Continue wellbutrin  300mg , buspar  15mg , prn klonopin .  UDS is updated today. I think her ear/jaw pain is due to clenching of her teeth.

## 2020-12-29 NOTE — Assessment & Plan Note (Signed)
Uncontrolled.  Continue buspar '15mg'$  bid and klonopin prn.

## 2020-12-29 NOTE — Progress Notes (Addendum)
Subjective:   By signing my name below, I, Shehryar Baig, attest that this documentation has been prepared under the direction and in the presence of [name] NP. 12/29/2020    Patient ID: Shannon [name], female    DOB: 07-04-1960, 60 y.o.   MRN: [id number]  Chief Complaint  Patient presents with   Anxiety    Here for follow up    HPI  Patient is in today for a office visit.  Ears- She complains of pain in her ears and jaw.  CBD- She reports having using a CBD cream on her wrist to manage her wrist pain. She does not use drugs.  Anxiety/Stress- She reports having elevated stress due to family events. She takes 0.5 mg klonopin daily PRN to manage her stress and anxiety. She notes having insomnia due to the stress. She has episodes of panic when overwhelmed with thoughts about her families situation. She continues taking 15 mg buspar 2x daily PO and reports no new issues while taking it. She is also requesting a refill on it.  Memory- She reports having memory testing recently and notes having an episode of crying due to thinking of her mothers dementia.  Psychologist- She reports having a new psychologist and has an [place] appointment with them. Psychiatrist- She is not seeing a psychiatrist to manage her medications and prefers to take her current medications for her symptoms.  Immunizations- She is not interested in getting a hepatitis C or HIV screening. She is eligible for the shingles shot and is willing to get it at her pharmacy.  Mammogram- She is due for a mammogram and is willing to set up an appointment.    Health Maintenance Due  Topic Date Due   Pneumococcal Vaccine 42-78 Years old (1 - PCV) Never done   Zoster Vaccines- Shingrix (1 of 2) Never done   MAMMOGRAM  10/18/2019   INFLUENZA VACCINE  12/01/2020   PAP SMEAR-Modifier  02/02/2021    Past Medical History:  Diagnosis Date   Anemia    Bilateral thumb pain 01/12/2018   Cervical spondylosis without  myelopathy 02/12/2016   Chronic midline posterior neck pain 06/19/2015   Concussion without loss of consciousness 06/18/2017   Generalized anxiety disorder with panic attacks 12/30/2010   History of eating disorder    anorexia nervosa in her 08-18-2022   Kidney stones    passed on their own   Low back pain 03/03/2020   Lumbar radicular pain 06/19/2015   Major depressive disorder 12/23/2012   Migraine headaches    Osteoarthritis of carpometacarpal (CMC) joint of thumb 01/10/2020   Postlaminectomy syndrome, lumbar region 06/19/2015   Raynaud's disease 03/23/2013   Sacroiliac dysfunction 06/19/2015   Spondylosis of lumbar region without myelopathy or radiculopathy 07/15/2015   Stomach disorder    due to complications with cholecystectomy "almost died"    Past Surgical History:  Procedure Laterality Date   APPENDECTOMY  08/18/1974   BREAST BIOPSY Right    CHOLECYSTECTOMY  08/2009   reports history of gallbladder polyps, she reports stents in duct of lushca    HYSTEROSCOPY  08-18-2011   LAMINECTOMY  1994   L4-5   TONSILLECTOMY  1982   TUBAL LIGATION      Family History  Problem Relation Age of Onset   Hypertension Mother    Memory loss Mother    Dementia Mother    Alcoholism Mother    Diabetes Father    Hypertension Father    Hypothyroidism Father  CAD Father    Stroke Father    Hashimoto's thyroiditis Sister    Hypothyroidism Sister    Neuropathy Sister        chronic inflammatory demyelinating peripheral neuropathy from lyme disease   Alcohol abuse Maternal Grandmother    Cancer Paternal Grandfather        lung cancer   Hypothyroidism Other     Social History   Socioeconomic History   Marital status: Married    Spouse name: Not on file   Number of children: 3   Years of education: 15   Highest education level: Associate degree: academic program  Occupational History    Employer: SELF EMPLOYED  Tobacco Use   Smoking status: Never   Smokeless tobacco: Never  Vaping  Use   Vaping Use: Never used  Substance and Sexual Activity   Alcohol use: Yes    Comment: occ.   Drug use: Not Currently   Sexual activity: Yes    Partners: Male    Birth control/protection: Surgical  Other Topics Concern   Not on file  Social History Narrative   Regular exercise:  Yes (swim instructor)   Caffeine Use:  1 daily   Married 3 children ages 61 son, 85 son, and 87 daughter   Associates degree   Right handed          Social Determinants of Health   Financial Resource Strain: Not on file  Food Insecurity: Not on file  Transportation Needs: Not on file  Physical Activity: Not on file  Stress: Not on file  Social Connections: Not on file  Intimate Partner Violence: Not on file    Outpatient Medications Prior to Visit  Medication Sig Dispense Refill   buPROPion (WELLBUTRIN XL) 300 MG 24 hr tablet Take 1 tablet (300 mg total) by mouth daily. 90 tablet 1   clonazePAM (KLONOPIN) 0.5 MG tablet Take 1 tablet (0.5 mg total) by mouth 3 (three) times daily as needed. for anxiety 80 tablet 0   LINZESS 290 MCG CAPS capsule Take 290 mcg by mouth daily.     traMADol (ULTRAM) 50 MG tablet tramadol 50 mg tablet  TAKE ONE TABLET BY MOUTH EVERY 4 TO 6 HOURS AS NEEDED     busPIRone (BUSPAR) 15 MG tablet Take 1 tablet (15 mg total) by mouth 2 (two) times daily. 180 tablet 1   No facility-administered medications prior to visit.    Allergies  Allergen Reactions   Buprenorphine Hcl     Other reaction(s): Other (See Comments) No relief   Cortisporin [Neomycin-Polymyxin-Hc] Swelling    Ear dropps   Eszopiclone     Headaches   Flexeril [Cyclobenzaprine Hcl] Other (See Comments)    Mood swings   Gabapentin     sedation   Morphine And Related Other (See Comments)    No relief   Nsaids Other (See Comments)    GI bleed   Paroxetine Nausea Only        Shellfish Allergy Other (See Comments)    skin & mouth tingle   Tizanidine Hcl Other (See Comments)    Dizziness, mood  swings   Tolmetin     Other reaction(s): Other (See Comments) GI bleed    Review of Systems  HENT:  Positive for ear pain.        (+)Jaw pain  Psychiatric/Behavioral:  The patient is nervous/anxious and has insomnia.       Objective:    Physical Exam Constitutional:  General: She is not in acute distress.    Appearance: Normal appearance. She is not ill-appearing.  HENT:     Head: Normocephalic and atraumatic.     Comments: Exostosis in left ear    Right Ear: External ear normal.     Left Ear: External ear normal.  Eyes:     Extraocular Movements: Extraocular movements intact.     Pupils: Pupils are equal, round, and reactive to light.  Cardiovascular:     Rate and Rhythm: Normal rate and regular rhythm.     Heart sounds: Normal heart sounds. No murmur heard.   No gallop.  Pulmonary:     Effort: Pulmonary effort is normal. No respiratory distress.     Breath sounds: Normal breath sounds. No wheezing or rales.  Skin:    General: Skin is warm and dry.  Neurological:     Mental Status: She is alert and oriented to person, place, and time.  Psychiatric:        Behavior: Behavior normal.    BP 120/80 (BP Location: Right Arm, Patient Position: Sitting, Cuff Size: Small)   Pulse 78   Temp 98.4 F (36.9 C) (Oral)   Resp 16   Ht '5\' 6"'$  (1.676 m)   Wt 117 lb (53.1 kg)   SpO2 100%   BMI 18.88 kg/m  Wt Readings from Last 3 Encounters:  12/29/20 117 lb (53.1 kg)  10/01/20 116 lb 6.4 oz (52.8 kg)  05/12/20 114 lb 9.6 oz (52 kg)       Assessment & Plan:   Problem List Items Addressed This Visit       Unprioritized   Major depressive disorder    Uncontrolled. She does not wish to change her medication at this time and is not currently following with psychiatry. She does have an appointment scheduled with a new counselor. Continue wellbutrin '300mg'$ , buspar '15mg'$ , prn klonopin.  UDS is updated today. I think her ear/jaw pain is due to clenching of her teeth.        Relevant Medications   busPIRone (BUSPAR) 15 MG tablet   Generalized anxiety disorder with panic attacks    Uncontrolled.  Continue buspar '15mg'$  bid and klonopin prn.       Relevant Medications   busPIRone (BUSPAR) 15 MG tablet   Other Visit Diagnoses     Anxiety    -  Primary   Relevant Medications   busPIRone (BUSPAR) 15 MG tablet   Other Relevant Orders   DRUG MONITORING, PANEL 8 WITH CONFIRMATION, URINE   Preventative health care       Relevant Orders   MM 3D SCREEN BREAST BILATERAL        Meds ordered this encounter  Medications   busPIRone (BUSPAR) 15 MG tablet    Sig: Take 1 tablet (15 mg total) by mouth 2 (two) times daily.    Dispense:  180 tablet    Refill:  1    Order Specific Question:   Supervising Provider    Answer:   Penni Homans A [[id number]]    I, [name] NP, personally preformed the services described in this documentation.  All medical record entries made by the scribe were at my direction and in my presence.  I have reviewed the chart and discharge instructions (if applicable) and agree that the record reflects my personal performance and is accurate and complete. 12/29/2020   I,Shehryar Baig,acting as a scribe for [name], NP.,have documented all relevant documentation on the  behalf of [name], NP,as directed by  [name], NP while in the presence of [name], NP.   [name], NP

## 2020-12-29 NOTE — Assessment & Plan Note (Addendum)
Uncontrolled. She does not wish to change her medication at this time and is not currently following with psychiatry. She does have an appointment scheduled with a new counselor. Continue wellbutrin '300mg'$ , buspar '15mg'$ , prn klonopin.  UDS is updated today. I think her ear/jaw pain is due to clenching of her teeth.

## 2020-12-30 ENCOUNTER — Ambulatory Visit (INDEPENDENT_AMBULATORY_CARE_PROVIDER_SITE_OTHER): Payer: 59 | Admitting: Psychologist

## 2020-12-30 DIAGNOSIS — F330 Major depressive disorder, recurrent, mild: Secondary | ICD-10-CM

## 2020-12-30 DIAGNOSIS — F411 Generalized anxiety disorder: Secondary | ICD-10-CM

## 2021-01-01 LAB — DRUG MONITORING, PANEL 8 WITH CONFIRMATION, URINE
6 Acetylmorphine: NEGATIVE ng/mL (ref <10)
Alcohol Metabolites: POSITIVE ng/mL — AB (ref <500)
Amphetamines: NEGATIVE ng/mL (ref <500)
Benzodiazepines: NEGATIVE ng/mL (ref <100)
Buprenorphine, Urine: NEGATIVE ng/mL (ref <5)
Cocaine Metabolite: NEGATIVE ng/mL (ref <150)
Creatinine: 28.3 mg/dL (ref >=20.0)
Ethyl Glucuronide (ETG): 965 ng/mL — ABNORMAL HIGH (ref <500)
Ethyl Sulfate (ETS): 412 ng/mL — ABNORMAL HIGH (ref <100)
MDMA: NEGATIVE ng/mL (ref <500)
Marijuana Metabolite: NEGATIVE ng/mL (ref <20)
Opiates: NEGATIVE ng/mL (ref <100)
Oxidant: NEGATIVE ug/mL (ref <200)
Oxycodone: NEGATIVE ng/mL (ref <100)
pH: 6 (ref 4.5–9.0)

## 2021-01-01 LAB — DM TEMPLATE

## 2021-01-02 ENCOUNTER — Other Ambulatory Visit: Payer: Self-pay | Admitting: Family

## 2021-01-06 ENCOUNTER — Other Ambulatory Visit: Payer: Self-pay | Admitting: Family

## 2021-01-06 DIAGNOSIS — R839 Unspecified abnormal finding in cerebrospinal fluid: Secondary | ICD-10-CM

## 2021-01-06 DIAGNOSIS — N6489 Other specified disorders of breast: Secondary | ICD-10-CM

## 2021-01-06 DIAGNOSIS — R928 Other abnormal and inconclusive findings on diagnostic imaging of breast: Secondary | ICD-10-CM

## 2021-01-07 ENCOUNTER — Emergency Department (HOSPITAL_BASED_OUTPATIENT_CLINIC_OR_DEPARTMENT_OTHER)
Admission: EM | Admit: 2021-01-07 | Discharge: 2021-01-07 | Disposition: A | Discharge status: Home / Self Care | Payer: BC Managed Care – PPO | Attending: Emergency Medicine | Admitting: Emergency Medicine

## 2021-01-07 ENCOUNTER — Other Ambulatory Visit: Payer: Self-pay

## 2021-01-07 DIAGNOSIS — Z23 Encounter for immunization: Secondary | ICD-10-CM | POA: Insufficient documentation

## 2021-01-07 DIAGNOSIS — S61012A Laceration without foreign body of left thumb without damage to nail, initial encounter: Secondary | ICD-10-CM | POA: Diagnosis not present

## 2021-01-07 DIAGNOSIS — S6992XA Unspecified injury of left wrist, hand and finger(s), initial encounter: Secondary | ICD-10-CM | POA: Diagnosis not present

## 2021-01-07 DIAGNOSIS — W260XXA Contact with knife, initial encounter: Secondary | ICD-10-CM | POA: Diagnosis not present

## 2021-01-07 MED ORDER — TETANUS-DIPHTH-ACELL PERTUSSIS 5-2.5-18.5 LF-MCG/0.5 IM SUSY
0.5000 mL | PREFILLED_SYRINGE | Freq: Once | INTRAMUSCULAR | Status: AC
  Administered 2021-01-07: 0.5 mL via INTRAMUSCULAR
  Filled 2021-01-07: qty 0.5

## 2021-01-07 MED ORDER — LIDOCAINE HCL (PF) 1 % IJ SOLN
10.0000 mL | Freq: Once | INTRAMUSCULAR | Status: AC
  Administered 2021-01-07: 10 mL
  Filled 2021-01-07: qty 10

## 2021-01-07 NOTE — Discharge Instructions (Addendum)
Have the sutures removed in 7 to 10 days. If you develop fever, discharge, spreading redness to the hand these are signs of infections and reasons to come back to the ED for further evaluation. Wash with soap and water after the first 24 hours, avoid any public swimming pools.

## 2021-01-07 NOTE — ED Notes (Signed)
ED Provider at bedside. 

## 2021-01-07 NOTE — ED Provider Notes (Signed)
[facility]ER[facility]ENC[facility] DEPARTMENT Provider Note   CSN: [id number] Arrival date & time: 01/07/21  1002     History Chief Complaint  Patient presents with  . Finger Injury    Shannon [name] is a 60 y.o. female.  HPI  Patient presents with laceration to first finger on left hand.  This happened acutely while she was trying to open a plastic item with a serrated knife.  Plastic slipped and she ended up cutting her finger, there is some bleeding and some tenderness.  She is able to flex and extend at the thumb.  Does elicit pain.  No associated numbness or tingling.  Has not tried any alleviating factors, mobilizing the digit is an aggravating factor.  She has not on any blood thinners, not diabetic. patient is not sure when her last Tdap was.  Past Medical History:  Diagnosis Date  . Anemia   . Bilateral thumb pain 01/12/2018  . Cervical spondylosis without myelopathy 02/12/2016  . Chronic midline posterior neck pain 06/19/2015  . Concussion without loss of consciousness 06/18/2017  . Generalized anxiety disorder with panic attacks 12/30/2010  . History of eating disorder    anorexia nervosa in her 20's  . Kidney stones    passed on their own  . Low back pain 03/03/2020  . Lumbar radicular pain 06/19/2015  . Major depressive disorder 12/23/2012  . Migraine headaches   . Osteoarthritis of carpometacarpal (CMC) joint of thumb 01/10/2020  . Postlaminectomy syndrome, lumbar region 06/19/2015  . Raynaud's disease 03/23/2013  . Sacroiliac dysfunction 06/19/2015  . Spondylosis of lumbar region without myelopathy or radiculopathy 07/15/2015  . Stomach disorder    due to complications with cholecystectomy "almost died"    Patient Active Problem List   Diagnosis Date Noted  . Low back pain 03/03/2020  . Osteoarthritis of carpometacarpal (CMC) joint of thumb 01/10/2020  . Bilateral thumb pain 01/12/2018  . Family history of colonic polyps 12/26/2017  . Concussion without  loss of consciousness 06/18/2017  . Cervical spondylosis without myelopathy 02/12/2016  . Spondylosis of lumbar region without myelopathy or radiculopathy 07/15/2015  . Chronic midline posterior neck pain 06/19/2015  . Sacroiliac dysfunction 06/19/2015  . Lumbar radicular pain 06/19/2015  . Postlaminectomy syndrome, lumbar region 06/19/2015  . Raynaud's disease 03/23/2013  . Major depressive disorder 12/23/2012  . ASCUS on Pap smear 02/16/2011  . Generalized anxiety disorder with panic attacks 12/30/2010    Past Surgical History:  Procedure Laterality Date  . APPENDECTOMY  1976  . BREAST BIOPSY Right   . CHOLECYSTECTOMY  08/2009   reports history of gallbladder polyps, she reports stents in duct of lushca   . HYSTEROSCOPY  07/2011  . LAMINECTOMY  1994   L4-5  . TONSILLECTOMY  1982  . TUBAL LIGATION       OB History   No obstetric history on file.     Family History  Problem Relation Age of Onset  . Hypertension Mother   . Memory loss Mother   . Dementia Mother   . Alcoholism Mother   . Diabetes Father   . Hypertension Father   . Hypothyroidism Father   . CAD Father   . Stroke Father   . Hashimoto's thyroiditis Sister   . Hypothyroidism Sister   . Neuropathy Sister        chronic inflammatory demyelinating peripheral neuropathy from lyme disease  . Alcohol abuse Maternal Grandmother   . Cancer Paternal Grandfather  lung cancer  . Hypothyroidism Other     Social History   Tobacco Use  . Smoking status: Never  . Smokeless tobacco: Never  Vaping Use  . Vaping Use: Never used  Substance Use Topics  . Alcohol use: Yes    Comment: occ.  . Drug use: Not Currently    Home Medications Prior to Admission medications   Medication Sig Start Date End Date Taking? Authorizing Provider  buPROPion (WELLBUTRIN XL) 300 MG 24 hr tablet TAKE ONE TABLET BY MOUTH DAILY 01/02/21   [name], NP  busPIRone (BUSPAR) 15 MG tablet Take 1 tablet (15 mg total) by  mouth 2 (two) times daily. 12/29/20   [name], NP  clonazePAM (KLONOPIN) 0.5 MG tablet Take 1 tablet (0.5 mg total) by mouth 3 (three) times daily as needed. for anxiety 12/24/20   [name], NP  LINZESS 290 MCG CAPS capsule Take 290 mcg by mouth daily. 09/29/20   [provider]  traMADol (ULTRAM) 50 MG tablet tramadol 50 mg tablet  TAKE ONE TABLET BY MOUTH EVERY 4 TO 6 HOURS AS NEEDED    [provider]    Allergies    Buprenorphine hcl, Cortisporin [neomycin-polymyxin-hc], Eszopiclone, Flexeril [cyclobenzaprine hcl], Gabapentin, Morphine and related, Nsaids, Paroxetine, Shellfish allergy, Tizanidine hcl, and Tolmetin  Review of Systems   Review of Systems  Physical Exam Updated Vital Signs BP 121/85 (BP Location: Right Arm)   Pulse 76   Temp 97.8 F (36.6 C) (Oral)   Resp 20   Wt 53.1 kg   SpO2 100%   BMI 18.88 kg/m   Physical Exam Vitals and nursing note reviewed. Exam conducted with a chaperone present.  Constitutional:      General: She is not in acute distress.    Appearance: Normal appearance.  HENT:     Head: Normocephalic and atraumatic.  Eyes:     General: No scleral icterus.    Extraocular Movements: Extraocular movements intact.     Pupils: Pupils are equal, round, and reactive to light.  Cardiovascular:     Comments: Radial pulse 2+ bilaterally Musculoskeletal:        General: Signs of injury present. No swelling. Normal range of motion.     Comments: Please see photo below.  Patient is a clean laceration to the digit, she is able to flex and extend the digit. Bleeding controlled, sensation to light touch in tact.   Skin:    Capillary Refill: Capillary refill takes less than 2 seconds.     Coloration: Skin is not jaundiced.  Neurological:     Mental Status: She is alert. Mental status is at baseline.     Coordination: Coordination normal.     ED Results / Procedures / Treatments   Labs (all labs ordered are listed,  but only abnormal results are displayed) Labs Reviewed - No data to display  EKG None  Radiology No results found.  Procedures .Marland KitchenLaceration Repair  Date/Time: 01/07/2021 11:20 AM Performed by: Sherrill Raring, PA-C Authorized by: Sherrill Raring, PA-C   Consent:    Consent obtained:  Verbal   Consent given by:  Patient   Risks discussed:  Need for additional repair, pain, infection and poor cosmetic result   Alternatives discussed:  No treatment Universal protocol:    Patient identity confirmed:  Verbally with patient Anesthesia:    Anesthesia method:  Local infiltration   Local anesthetic:  Lidocaine 1% w/o epi Laceration details:    Location:  Finger  Finger location:  L thumb Pre-procedure details:    Preparation:  Patient was prepped and draped in usual sterile fashion Exploration:    Hemostasis achieved with:  Direct pressure   Contaminated: no   Treatment:    Area cleansed with:  Saline   Amount of cleaning:  Standard   Irrigation solution:  Sterile saline   Irrigation volume:  1 L   Irrigation method:  Pressure wash   Debridement:  None   Undermining:  None   Scar revision: no   Skin repair:    Repair method:  Sutures   Suture size:  5-0   Suture material:  Nylon   Suture technique:  Simple interrupted   Number of sutures:  3 Approximation:    Approximation:  Close Post-procedure details:    Dressing:  Non-adherent dressing   Procedure completion:  Tolerated well, no immediate complications   Medications Ordered in ED Medications  Tdap (BOOSTRIX) injection 0.5 mL (has no administration in time range)  lidocaine (PF) (XYLOCAINE) 1 % injection 10 mL (has no administration in time range)    ED Course  I have reviewed the triage vital signs and the nursing notes.  Pertinent labs & imaging results that were available during my care of the patient were reviewed by me and considered in my medical decision making (see chart for details).    MDM  Rules/Calculators/A&P                           Patient's vitals are stable, she is neurovascularly intact.  The wound is superficial and do not think radiographic imaging would be beneficial, very low suspicion for any retained foreign bodies given the mechanism.  Good cap refill, radial pulse intact.  Clean wound, will proceed to do suture repair.  Patient handled procedure well, she is appropriate discharge at this time.  Return precautions given. Final Clinical Impression(s) / ED Diagnoses Final diagnoses:  None    Rx / DC Orders ED Discharge Orders     None        Sherrill Raring, Hershal Coria 01/07/21 1122    Godfrey Pick, MD 01/08/21 [phone]

## 2021-01-07 NOTE — ED Triage Notes (Signed)
Sliced tip of thumb off, left thumb.

## 2021-01-07 NOTE — ED Notes (Addendum)
First contact with patient. Patient arrived via triage with complaints of left thumb laceration (left first digit).Patient sts she was cutting plastic off of furniture when the knife slipped.  Pt is A&OX 4. Respirations even/unlabored. Patient placed on monitor. Patient updated on plan of care. Suture tray at bedside

## 2021-01-14 ENCOUNTER — Ambulatory Visit: Payer: BC Managed Care – PPO | Admitting: Family

## 2021-01-14 ENCOUNTER — Other Ambulatory Visit: Payer: Self-pay

## 2021-01-14 VITALS — BP 104/66 | HR 73 | Temp 98.6°F | Resp 16 | Wt 119.0 lb

## 2021-01-14 DIAGNOSIS — S61012A Laceration without foreign body of left thumb without damage to nail, initial encounter: Secondary | ICD-10-CM

## 2021-01-14 DIAGNOSIS — K5900 Constipation, unspecified: Secondary | ICD-10-CM | POA: Diagnosis not present

## 2021-01-14 DIAGNOSIS — R58 Hemorrhage, not elsewhere classified: Secondary | ICD-10-CM

## 2021-01-14 DIAGNOSIS — K589 Irritable bowel syndrome without diarrhea: Secondary | ICD-10-CM | POA: Insufficient documentation

## 2021-01-14 HISTORY — DX: Laceration without foreign body of left thumb without damage to nail, initial encounter: S61.012A

## 2021-01-14 HISTORY — DX: Constipation, unspecified: K59.00

## 2021-01-14 LAB — CBC WITH DIFFERENTIAL/PLATELET
Basophils Absolute: 0 10*3/uL (ref 0.0–0.1)
Basophils Relative: 0.8 % (ref 0.0–3.0)
Eosinophils Absolute: 0.1 10*3/uL (ref 0.0–0.7)
Eosinophils Relative: 2.3 % (ref 0.0–5.0)
HCT: 41.3 % (ref 36.0–46.0)
Hemoglobin: 14 g/dL (ref 12.0–15.0)
Lymphocytes Relative: 42.9 % (ref 12.0–46.0)
Lymphs Abs: 1.8 10*3/uL (ref 0.7–4.0)
MCHC: 33.8 g/dL (ref 30.0–36.0)
MCV: 95.3 fl (ref 78.0–100.0)
Monocytes Absolute: 0.6 10*3/uL (ref 0.1–1.0)
Monocytes Relative: 14.3 % — ABNORMAL HIGH (ref 3.0–12.0)
Neutro Abs: 1.7 10*3/uL (ref 1.4–7.7)
Neutrophils Relative %: 39.7 % — ABNORMAL LOW (ref 43.0–77.0)
Platelets: 298 10*3/uL (ref 150.0–400.0)
RBC: 4.33 Mil/uL (ref 3.87–5.11)
RDW: 12.6 % (ref 11.5–15.5)
WBC: 4.3 10*3/uL (ref 4.0–10.5)

## 2021-01-14 NOTE — Progress Notes (Addendum)
Subjective:   By signing my name below, I, Shehryar Baig, attest that this documentation has been prepared under the direction and in the presence of [name] NP. 01/14/2021    Patient ID: Shannon [name], female    DOB: 1960/07/25, 60 y.o.   MRN: [id number]  Chief Complaint  Patient presents with   Suture / Staple Removal    Here for suture removal from left hand thumb. After injury 1 week ago.    HPI Patient is in today for a office visit.  Suture removal- She reports cutting her left thumb with a knife by accident while trying to cut plastic last Wednesday on 01/07/2021. She has gotten sutures placed in the ED to manage the laceration. Her wound is healing well and she ready to get them removed during this visit.  Weight gain- She has gained weight since her last visit. She report being constipated and thinks may be causing her recent weight gain.  Wt Readings from Last 3 Encounters:  01/14/21 119 lb (54 kg)  01/07/21 117 lb (53.1 kg)  12/29/20 117 lb (53.1 kg)    Bruising- She reports bruising easily on her lower legs. She does not take aspirin due to causing her stomach issues. She denies having any injuries on her legs.    Health Maintenance Due  Topic Date Due   Pneumococcal Vaccine 9-25 Years old (1 - PCV) Never done   Zoster Vaccines- Shingrix (1 of 2) Never done   INFLUENZA VACCINE  12/01/2020   PAP SMEAR-Modifier  02/02/2021    Past Medical History:  Diagnosis Date   Anemia    Bilateral thumb pain 01/12/2018   Cervical spondylosis without myelopathy 02/12/2016   Chronic midline posterior neck pain 06/19/2015   Concussion without loss of consciousness 06/18/2017   Generalized anxiety disorder with panic attacks 12/30/2010   History of eating disorder    anorexia nervosa in her 07-28-2022   Kidney stones    passed on their own   Low back pain 03/03/2020   Lumbar radicular pain 06/19/2015   Major depressive disorder 12/23/2012   Migraine headaches     Osteoarthritis of carpometacarpal (CMC) joint of thumb 01/10/2020   Postlaminectomy syndrome, lumbar region 06/19/2015   Raynaud's disease 03/23/2013   Sacroiliac dysfunction 06/19/2015   Spondylosis of lumbar region without myelopathy or radiculopathy 07/15/2015   Stomach disorder    due to complications with cholecystectomy "almost died"    Past Surgical History:  Procedure Laterality Date   APPENDECTOMY  07-28-74   BREAST BIOPSY Right    CHOLECYSTECTOMY  08/2009   reports history of gallbladder polyps, she reports stents in duct of lushca    HYSTEROSCOPY  Jul 28, 2011   LAMINECTOMY  1994   L4-5   TONSILLECTOMY  1982   TUBAL LIGATION      Family History  Problem Relation Age of Onset   Hypertension Mother    Memory loss Mother    Dementia Mother    Alcoholism Mother    Diabetes Father    Hypertension Father    Hypothyroidism Father    CAD Father    Stroke Father    Hashimoto's thyroiditis Sister    Hypothyroidism Sister    Neuropathy Sister        chronic inflammatory demyelinating peripheral neuropathy from lyme disease   Alcohol abuse Maternal Grandmother    Cancer Paternal Grandfather        lung cancer   Hypothyroidism Other     Social History  Socioeconomic History   Marital status: Married    Spouse name: Not on file   Number of children: 3   Years of education: 15   Highest education level: Associate degree: academic program  Occupational History    Employer: SELF EMPLOYED  Tobacco Use   Smoking status: Never   Smokeless tobacco: Never  Vaping Use   Vaping Use: Never used  Substance and Sexual Activity   Alcohol use: Yes    Comment: occ.   Drug use: Not Currently   Sexual activity: Yes    Partners: Male    Birth control/protection: Surgical  Other Topics Concern   Not on file  Social History Narrative   Regular exercise:  Yes (swim instructor)   Caffeine Use:  1 daily   Married 3 children ages 40 son, 21 son, and 44 daughter   Associates degree    Right handed          Social Determinants of Health   Financial Resource Strain: Not on file  Food Insecurity: Not on file  Transportation Needs: Not on file  Physical Activity: Not on file  Stress: Not on file  Social Connections: Not on file  Intimate Partner Violence: Not on file    Outpatient Medications Prior to Visit  Medication Sig Dispense Refill   buPROPion (WELLBUTRIN XL) 300 MG 24 hr tablet TAKE ONE TABLET BY MOUTH DAILY 30 tablet 5   busPIRone (BUSPAR) 15 MG tablet Take 1 tablet (15 mg total) by mouth 2 (two) times daily. 180 tablet 1   clonazePAM (KLONOPIN) 0.5 MG tablet Take 1 tablet (0.5 mg total) by mouth 3 (three) times daily as needed. for anxiety 80 tablet 0   LINZESS 290 MCG CAPS capsule Take 290 mcg by mouth daily.     traMADol (ULTRAM) 50 MG tablet tramadol 50 mg tablet  TAKE ONE TABLET BY MOUTH EVERY 4 TO 6 HOURS AS NEEDED     No facility-administered medications prior to visit.    Allergies  Allergen Reactions   Buprenorphine Hcl     Other reaction(s): Other (See Comments) No relief   Cortisporin [Neomycin-Polymyxin-Hc] Swelling    Ear dropps   Eszopiclone     Headaches   Flexeril [Cyclobenzaprine Hcl] Other (See Comments)    Mood swings   Gabapentin     sedation   Morphine And Related Other (See Comments)    No relief   Nsaids Other (See Comments)    GI bleed   Paroxetine Nausea Only        Shellfish Allergy Other (See Comments)    skin & mouth tingle   Tizanidine Hcl Other (See Comments)    Dizziness, mood swings   Tolmetin     Other reaction(s): Other (See Comments) GI bleed    Review of Systems  Endo/Heme/Allergies:  Bruises/bleeds easily (easy lower extermity bruising).      Objective:    Physical Exam Constitutional:      General: She is not in acute distress.    Appearance: Normal appearance. She is not ill-appearing.  HENT:     Head: Normocephalic and atraumatic.     Right Ear: External ear normal.     Left Ear:  External ear normal.  Eyes:     Pupils: Pupils are equal, round, and reactive to light.  Pulmonary:     Effort: Pulmonary effort is normal.     Breath sounds: No rales.  Skin:    General: Skin is warm and dry.  Comments: Well healing laceration noted left thumb Several bruises noted on bilateral shins  Neurological:     Mental Status: She is alert and oriented to person, place, and time.  Psychiatric:        Behavior: Behavior normal.     BP 104/66 (BP Location: Right Arm, Patient Position: Sitting, Cuff Size: Small)   Pulse 73   Temp 98.6 F (37 C) (Oral)   Resp 16   Wt 119 lb (54 kg)   SpO2 100%   BMI 19.21 kg/m  Wt Readings from Last 3 Encounters:  01/14/21 119 lb (54 kg)  01/07/21 117 lb (53.1 kg)  12/29/20 117 lb (53.1 kg)       Assessment & Plan:   Problem List Items Addressed This Visit       Unprioritized   Laceration of left thumb    Well healed. 3 sutures removed. Pt tolerated suture removal well.       Ecchymosis - Primary    New. Denies use of aspirin products.  Will check CBC.       Relevant Orders   CBC with Differential/Platelet   Constipation    Recommended miralax prn.        No orders of the defined types were placed in this encounter.   I, [name] NP, personally preformed the services described in this documentation.  All medical record entries made by the scribe were at my direction and in my presence.  I have reviewed the chart and discharge instructions (if applicable) and agree that the record reflects my personal performance and is accurate and complete. 01/14/2021   I,Shehryar Baig,acting as a scribe for [name], NP.,have documented all relevant documentation on the behalf of [name], NP,as directed by  [name], NP while in the presence of [name], NP.   [name], NP

## 2021-01-14 NOTE — Assessment & Plan Note (Signed)
>>  ASSESSMENT AND PLAN FOR ECCHYMOSIS WRITTEN ON 01/14/2021  8:29 AM BY Sandford Craze, NP  New. Denies use of aspirin products.  Will check CBC.

## 2021-01-14 NOTE — Assessment & Plan Note (Signed)
Recommended miralax prn.

## 2021-01-14 NOTE — Assessment & Plan Note (Signed)
New. Denies use of aspirin products.  Will check CBC.

## 2021-01-14 NOTE — Assessment & Plan Note (Signed)
Well healed. 3 sutures removed. Pt tolerated suture removal well.

## 2021-01-19 ENCOUNTER — Ambulatory Visit: Payer: 59 | Admitting: Psychologist

## 2021-01-22 ENCOUNTER — Other Ambulatory Visit: Payer: Self-pay | Admitting: Family

## 2021-01-22 NOTE — Telephone Encounter (Signed)
Requesting: clonazepam 0.5mg  Contract: 08/29/2019 UDS: 12/29/2020 Last Visit: 01/14/2021 Next Visit: 02/11/2021 Last Refill: 12/24/2020 #80 and 0RF Pt sig: 1 tab tid prn  Please Advise

## 2021-01-26 ENCOUNTER — Other Ambulatory Visit: Payer: Self-pay

## 2021-01-26 ENCOUNTER — Ambulatory Visit: Admission: RE | Admit: 2021-01-26 | Payer: 59 | Source: Ambulatory Visit

## 2021-01-26 ENCOUNTER — Ambulatory Visit
Admission: RE | Admit: 2021-01-26 | Discharge: 2021-01-26 | Disposition: A | Discharge status: Home / Self Care | Payer: BC Managed Care – PPO | Source: Ambulatory Visit | Attending: Family | Admitting: Family

## 2021-01-26 DIAGNOSIS — R922 Inconclusive mammogram: Secondary | ICD-10-CM | POA: Diagnosis not present

## 2021-01-26 DIAGNOSIS — R928 Other abnormal and inconclusive findings on diagnostic imaging of breast: Secondary | ICD-10-CM

## 2021-02-11 ENCOUNTER — Encounter: Payer: BC Managed Care – PPO | Admitting: Family

## 2021-02-23 ENCOUNTER — Other Ambulatory Visit: Payer: Self-pay | Admitting: Family

## 2021-03-17 LAB — HM PAP SMEAR: HM Pap smear: NEGATIVE

## 2021-03-24 ENCOUNTER — Other Ambulatory Visit: Payer: Self-pay | Admitting: Family

## 2021-03-25 DIAGNOSIS — M19032 Primary osteoarthritis, left wrist: Secondary | ICD-10-CM | POA: Diagnosis not present

## 2021-03-25 DIAGNOSIS — M25532 Pain in left wrist: Secondary | ICD-10-CM | POA: Diagnosis not present

## 2021-03-25 DIAGNOSIS — M25531 Pain in right wrist: Secondary | ICD-10-CM | POA: Diagnosis not present

## 2021-03-25 DIAGNOSIS — M13841 Other specified arthritis, right hand: Secondary | ICD-10-CM | POA: Diagnosis not present

## 2021-03-25 DIAGNOSIS — M19031 Primary osteoarthritis, right wrist: Secondary | ICD-10-CM | POA: Diagnosis not present

## 2021-03-30 DIAGNOSIS — L814 Other melanin hyperpigmentation: Secondary | ICD-10-CM | POA: Diagnosis not present

## 2021-03-30 DIAGNOSIS — L578 Other skin changes due to chronic exposure to nonionizing radiation: Secondary | ICD-10-CM | POA: Diagnosis not present

## 2021-03-30 DIAGNOSIS — L570 Actinic keratosis: Secondary | ICD-10-CM | POA: Diagnosis not present

## 2021-03-30 DIAGNOSIS — D225 Melanocytic nevi of trunk: Secondary | ICD-10-CM | POA: Diagnosis not present

## 2021-03-30 DIAGNOSIS — X32XXXS Exposure to sunlight, sequela: Secondary | ICD-10-CM | POA: Diagnosis not present

## 2021-04-14 DIAGNOSIS — K648 Other hemorrhoids: Secondary | ICD-10-CM | POA: Diagnosis not present

## 2021-04-14 DIAGNOSIS — Z1211 Encounter for screening for malignant neoplasm of colon: Secondary | ICD-10-CM | POA: Diagnosis not present

## 2021-04-14 DIAGNOSIS — Z800 Family history of malignant neoplasm of digestive organs: Secondary | ICD-10-CM | POA: Diagnosis not present

## 2021-04-14 DIAGNOSIS — K640 First degree hemorrhoids: Secondary | ICD-10-CM | POA: Diagnosis not present

## 2021-04-14 LAB — HM COLONOSCOPY

## 2021-04-16 DIAGNOSIS — Z681 Body mass index (BMI) 19 or less, adult: Secondary | ICD-10-CM | POA: Diagnosis not present

## 2021-04-16 DIAGNOSIS — N952 Postmenopausal atrophic vaginitis: Secondary | ICD-10-CM | POA: Diagnosis not present

## 2021-04-16 DIAGNOSIS — Z1329 Encounter for screening for other suspected endocrine disorder: Secondary | ICD-10-CM | POA: Diagnosis not present

## 2021-04-16 DIAGNOSIS — Z130 Encounter for screening for diseases of the blood and blood-forming organs and certain disorders involving the immune mechanism: Secondary | ICD-10-CM | POA: Diagnosis not present

## 2021-04-16 DIAGNOSIS — Z01419 Encounter for gynecological examination (general) (routine) without abnormal findings: Secondary | ICD-10-CM | POA: Diagnosis not present

## 2021-04-20 ENCOUNTER — Other Ambulatory Visit: Payer: Self-pay | Admitting: Family

## 2021-04-20 ENCOUNTER — Encounter: Payer: Self-pay | Admitting: Family

## 2021-04-24 ENCOUNTER — Ambulatory Visit: Payer: BC Managed Care – PPO | Admitting: Family

## 2021-04-24 DIAGNOSIS — F332 Major depressive disorder, recurrent severe without psychotic features: Secondary | ICD-10-CM | POA: Diagnosis not present

## 2021-04-24 DIAGNOSIS — F410 Panic disorder [episodic paroxysmal anxiety] without agoraphobia: Secondary | ICD-10-CM | POA: Diagnosis not present

## 2021-04-24 DIAGNOSIS — F411 Generalized anxiety disorder: Secondary | ICD-10-CM

## 2021-04-24 MED ORDER — BUPROPION HCL ER (XL) 300 MG PO TB24: 300.0000 mg | ORAL_TABLET | Freq: Every day | ORAL | 1 refills | Status: DC

## 2021-04-24 NOTE — Progress Notes (Signed)
Established Patient Office Visit  Subjective:  Patient ID: Shannon [name], female    DOB: 23-Nov-1960  Age: 60 y.o. MRN: [id number]  CC:  Chief Complaint  Patient presents with   Depression    Patient here for increased depression    HPI Shannon [name] presents today to discuss worsening depression. She reports increased stress recently.  Her elderly parents and their refusal to enter assisted living is a big source of her stress. She reports that this past month, the generic manufacturer changed on her wellbutrin and she thinks this is contributing to her worsening depression. She was seeing a therapist but did not feel like it was a good fit. She is scheduled to establish with a new therapist this MondayClaudie Leach in [place]. She uses klonopin TID.  She notes tearfulness if she does not take the Klonopin  Past Medical History:  Diagnosis Date   Anemia    Bilateral thumb pain 01/12/2018   Cervical spondylosis without myelopathy 02/12/2016   Chronic midline posterior neck pain 06/19/2015   Concussion without loss of consciousness 06/18/2017   Generalized anxiety disorder with panic attacks 12/30/2010   History of eating disorder    anorexia nervosa in her 2022/07/22   Kidney stones    passed on their own   Low back pain 03/03/2020   Lumbar radicular pain 06/19/2015   Major depressive disorder 12/23/2012   Migraine headaches    Osteoarthritis of carpometacarpal (CMC) joint of thumb 01/10/2020   Postlaminectomy syndrome, lumbar region 06/19/2015   Raynaud's disease 03/23/2013   Sacroiliac dysfunction 06/19/2015   Spondylosis of lumbar region without myelopathy or radiculopathy 07/15/2015   Stomach disorder    due to complications with cholecystectomy "almost died"    Past Surgical History:  Procedure Laterality Date   APPENDECTOMY  1974/07/22   BREAST BIOPSY Right    CHOLECYSTECTOMY  08/2009   reports history of gallbladder polyps, she reports stents in duct of  lushca    HYSTEROSCOPY  07-22-11   LAMINECTOMY  1994   L4-5   TONSILLECTOMY  1982   TUBAL LIGATION      Family History  Problem Relation Age of Onset   Hypertension Mother    Memory loss Mother    Dementia Mother    Alcoholism Mother    Diabetes Father    Hypertension Father    Hypothyroidism Father    CAD Father    Stroke Father    Hashimoto's thyroiditis Sister    Hypothyroidism Sister    Neuropathy Sister        chronic inflammatory demyelinating peripheral neuropathy from lyme disease   Alcohol abuse Maternal Grandmother    Cancer Paternal Grandfather        lung cancer   Hypothyroidism Other     Social History   Socioeconomic History   Marital status: Married    Spouse name: Not on file   Number of children: 3   Years of education: 15   Highest education level: Associate degree: academic program  Occupational History    Employer: SELF EMPLOYED  Tobacco Use   Smoking status: Never   Smokeless tobacco: Never  Vaping Use   Vaping Use: Never used  Substance and Sexual Activity   Alcohol use: Yes    Comment: occ.   Drug use: Not Currently   Sexual activity: Yes    Partners: Male    Birth control/protection: Surgical  Other Topics Concern   Not on file  Social History Narrative  Regular exercise:  Yes (swim instructor)   Caffeine Use:  1 daily   Married 3 children ages 60 son, 7 son, and 58 daughter   Associates degree   Right handed          Social Determinants of Health   Financial Resource Strain: Not on file  Food Insecurity: Not on file  Transportation Needs: Not on file  Physical Activity: Not on file  Stress: Not on file  Social Connections: Not on file  Intimate Partner Violence: Not on file    Outpatient Medications Prior to Visit  Medication Sig Dispense Refill   busPIRone (BUSPAR) 15 MG tablet Take 1 tablet (15 mg total) by mouth 2 (two) times daily. 180 tablet 1   clonazePAM (KLONOPIN) 0.5 MG tablet TAKE ONE TABLET BY MOUTH THREE  TIMES A DAY AS NEEDED FOR ANXIETY 80 tablet 0   Plecanatide (TRULANCE) 3 MG TABS Take by mouth.     traMADol (ULTRAM) 50 MG tablet tramadol 50 mg tablet  TAKE ONE TABLET BY MOUTH EVERY 4 TO 6 HOURS AS NEEDED     buPROPion (WELLBUTRIN XL) 300 MG 24 hr tablet TAKE ONE TABLET BY MOUTH DAILY 30 tablet 5   LINZESS 290 MCG CAPS capsule Take 290 mcg by mouth daily.     No facility-administered medications prior to visit.    Allergies  Allergen Reactions   Buprenorphine Hcl     Other reaction(s): Other (See Comments) No relief   Cortisporin [Neomycin-Polymyxin-Hc] Swelling    Ear dropps   Eszopiclone     Headaches   Flexeril [Cyclobenzaprine Hcl] Other (See Comments)    Mood swings   Gabapentin     sedation   Morphine And Related Other (See Comments)    No relief   Nsaids Other (See Comments)    GI bleed   Paroxetine Nausea Only        Shellfish Allergy Other (See Comments)    skin & mouth tingle   Tizanidine Hcl Other (See Comments)    Dizziness, mood swings   Tolmetin     Other reaction(s): Other (See Comments) GI bleed    ROS Review of Systems   See HPI   Objective:    Physical Exam Constitutional:      Appearance: She is well-developed.  Cardiovascular:     Rate and Rhythm: Normal rate and regular rhythm.     Heart sounds: Normal heart sounds. No murmur heard. Pulmonary:     Effort: Pulmonary effort is normal. No respiratory distress.     Breath sounds: Normal breath sounds. No wheezing.  Psychiatric:        Behavior: Behavior normal.        Thought Content: Thought content normal.        Judgment: Judgment normal.    BP 110/73 (BP Location: Right Arm, Patient Position: Sitting, Cuff Size: Small)    Pulse 66    Temp 98.2 F (36.8 C) (Oral)    Resp 16    Ht 5\' 6"  (1.676 m)    Wt 119 lb (54 kg)    SpO2 100%    BMI 19.21 kg/m  Wt Readings from Last 3 Encounters:  04/24/21 119 lb (54 kg)  01/14/21 119 lb (54 kg)  01/07/21 117 lb (53.1 kg)     Health  Maintenance Due  Topic Date Due   Pneumococcal Vaccine 29-14 Years old (1 - PCV) Never done   Zoster Vaccines- Shingrix (1 of 2) Never done  INFLUENZA VACCINE  12/01/2020   PAP SMEAR-Modifier  02/02/2021    There are no preventive care reminders to display for this patient.  Lab Results  Component Value Date   TSH 1.12 10/01/2020   Lab Results  Component Value Date   WBC 4.3 01/14/2021   HGB 14.0 01/14/2021   HCT 41.3 01/14/2021   MCV 95.3 01/14/2021   PLT 298.0 01/14/2021   Lab Results  Component Value Date   NA 138 05/05/2016   K 3.8 05/05/2016   CO2 31 05/05/2016   GLUCOSE 98 05/05/2016   BUN 12 05/05/2016   CREATININE 0.82 05/05/2016   BILITOT 0.6 03/13/2015   ALKPHOS 54 03/13/2015   AST 16 03/13/2015   ALT 11 03/13/2015   PROT 6.8 03/13/2015   ALBUMIN 4.1 03/13/2015   CALCIUM 8.7 05/05/2016   GFR 76.99 05/05/2016   Lab Results  Component Value Date   CHOL 171 03/13/2015   Lab Results  Component Value Date   HDL 58.40 03/13/2015   Lab Results  Component Value Date   LDLCALC 100 (H) 03/13/2015   Lab Results  Component Value Date   TRIG 66.0 03/13/2015   Lab Results  Component Value Date   CHOLHDL 3 03/13/2015   Lab Results  Component Value Date   HGBA1C 5.3 03/08/2012      Assessment & Plan:   Problem List Items Addressed This Visit       Unprioritized   Major depressive disorder    Uncontrolled. New Rx sent for Wellbutrin XR- brand name only. I think that counseling will be very helpful for her as well.      Relevant Medications   buPROPion (WELLBUTRIN XL) 300 MG 24 hr tablet   Generalized anxiety disorder with panic attacks    Uncontrolled. Continue buspar/klonopin and establish with counselor as scheduled.       Relevant Medications   buPROPion (WELLBUTRIN XL) 300 MG 24 hr tablet  20 minutes spent on today's visit. The majority of this time was spent counseling patient.   Meds ordered this encounter  Medications   buPROPion  (WELLBUTRIN XL) 300 MG 24 hr tablet    Sig: Take 1 tablet (300 mg total) by mouth daily.    Dispense:  90 tablet    Refill:  1    Requests brand name only    Order Specific Question:   Supervising Provider    Answer:   Penni Homans A [[id number]]    Follow-up: Return in about 3 months (around 07/23/2021) for follow up visit.    [name], NP

## 2021-04-24 NOTE — Assessment & Plan Note (Signed)
>>  ASSESSMENT AND PLAN FOR MAJOR DEPRESSIVE DISORDER WRITTEN ON 04/24/2021  8:13 AM BY O'SULLIVAN, Ashlinn Hemrick, NP  Uncontrolled. New Rx sent for Wellbutrin  XR- brand name only. I think that counseling will be very helpful for her as well.

## 2021-04-24 NOTE — Assessment & Plan Note (Signed)
Uncontrolled. New Rx sent for Wellbutrin XR- brand name only. I think that counseling will be very helpful for her as well.

## 2021-04-24 NOTE — Assessment & Plan Note (Signed)
Uncontrolled. Continue buspar/klonopin and establish with counselor as scheduled.

## 2021-04-27 DIAGNOSIS — Z62811 Personal history of psychological abuse in childhood: Secondary | ICD-10-CM | POA: Diagnosis not present

## 2021-04-27 DIAGNOSIS — F341 Dysthymic disorder: Secondary | ICD-10-CM | POA: Diagnosis not present

## 2021-04-27 DIAGNOSIS — F422 Mixed obsessional thoughts and acts: Secondary | ICD-10-CM | POA: Diagnosis not present

## 2021-04-27 DIAGNOSIS — F4310 Post-traumatic stress disorder, unspecified: Secondary | ICD-10-CM | POA: Diagnosis not present

## 2021-04-29 DIAGNOSIS — M5412 Radiculopathy, cervical region: Secondary | ICD-10-CM | POA: Diagnosis not present

## 2021-04-29 DIAGNOSIS — M542 Cervicalgia: Secondary | ICD-10-CM | POA: Diagnosis not present

## 2021-04-29 DIAGNOSIS — M5459 Other low back pain: Secondary | ICD-10-CM | POA: Diagnosis not present

## 2021-04-29 DIAGNOSIS — M961 Postlaminectomy syndrome, not elsewhere classified: Secondary | ICD-10-CM | POA: Diagnosis not present

## 2021-05-04 DIAGNOSIS — Z729 Problem related to lifestyle, unspecified: Secondary | ICD-10-CM | POA: Diagnosis not present

## 2021-05-04 DIAGNOSIS — F341 Dysthymic disorder: Secondary | ICD-10-CM | POA: Diagnosis not present

## 2021-05-04 DIAGNOSIS — Z638 Other specified problems related to primary support group: Secondary | ICD-10-CM | POA: Diagnosis not present

## 2021-05-04 DIAGNOSIS — F422 Mixed obsessional thoughts and acts: Secondary | ICD-10-CM | POA: Diagnosis not present

## 2021-05-22 ENCOUNTER — Other Ambulatory Visit: Payer: Self-pay | Admitting: Family

## 2021-05-22 NOTE — Telephone Encounter (Signed)
Requesting: clonazepam  Contract:  UDS: 12/29/20 Last Visit: 04/24/21 Next Visit: 07/20/21 Last Refill: 04/21/21  Please Advise

## 2021-06-01 DIAGNOSIS — M13841 Other specified arthritis, right hand: Secondary | ICD-10-CM | POA: Diagnosis not present

## 2021-06-01 DIAGNOSIS — M25531 Pain in right wrist: Secondary | ICD-10-CM | POA: Diagnosis not present

## 2021-06-01 DIAGNOSIS — M25532 Pain in left wrist: Secondary | ICD-10-CM | POA: Diagnosis not present

## 2021-06-03 DIAGNOSIS — F4310 Post-traumatic stress disorder, unspecified: Secondary | ICD-10-CM | POA: Diagnosis not present

## 2021-06-03 DIAGNOSIS — Z729 Problem related to lifestyle, unspecified: Secondary | ICD-10-CM | POA: Diagnosis not present

## 2021-06-03 DIAGNOSIS — Z638 Other specified problems related to primary support group: Secondary | ICD-10-CM | POA: Diagnosis not present

## 2021-06-03 DIAGNOSIS — F341 Dysthymic disorder: Secondary | ICD-10-CM | POA: Diagnosis not present

## 2021-06-17 DIAGNOSIS — F4310 Post-traumatic stress disorder, unspecified: Secondary | ICD-10-CM | POA: Diagnosis not present

## 2021-06-17 DIAGNOSIS — Z638 Other specified problems related to primary support group: Secondary | ICD-10-CM | POA: Diagnosis not present

## 2021-06-17 DIAGNOSIS — F422 Mixed obsessional thoughts and acts: Secondary | ICD-10-CM | POA: Diagnosis not present

## 2021-06-17 DIAGNOSIS — F341 Dysthymic disorder: Secondary | ICD-10-CM | POA: Diagnosis not present

## 2021-06-24 ENCOUNTER — Other Ambulatory Visit: Payer: Self-pay | Admitting: Family

## 2021-07-01 DIAGNOSIS — M25552 Pain in left hip: Secondary | ICD-10-CM | POA: Diagnosis not present

## 2021-07-01 DIAGNOSIS — M25551 Pain in right hip: Secondary | ICD-10-CM | POA: Diagnosis not present

## 2021-07-01 DIAGNOSIS — Z729 Problem related to lifestyle, unspecified: Secondary | ICD-10-CM | POA: Diagnosis not present

## 2021-07-01 DIAGNOSIS — F341 Dysthymic disorder: Secondary | ICD-10-CM | POA: Diagnosis not present

## 2021-07-01 DIAGNOSIS — Z638 Other specified problems related to primary support group: Secondary | ICD-10-CM | POA: Diagnosis not present

## 2021-07-01 DIAGNOSIS — Z630 Problems in relationship with spouse or partner: Secondary | ICD-10-CM | POA: Diagnosis not present

## 2021-07-07 DIAGNOSIS — M25551 Pain in right hip: Secondary | ICD-10-CM | POA: Diagnosis not present

## 2021-07-07 DIAGNOSIS — M5416 Radiculopathy, lumbar region: Secondary | ICD-10-CM | POA: Diagnosis not present

## 2021-07-13 ENCOUNTER — Telehealth: Payer: Self-pay

## 2021-07-13 ENCOUNTER — Ambulatory Visit: Payer: BC Managed Care – PPO | Admitting: Family

## 2021-07-13 VITALS — BP 121/70 | HR 76 | Temp 97.9°F | Resp 16 | Wt 117.8 lb

## 2021-07-13 DIAGNOSIS — F410 Panic disorder [episodic paroxysmal anxiety] without agoraphobia: Secondary | ICD-10-CM

## 2021-07-13 DIAGNOSIS — F411 Generalized anxiety disorder: Secondary | ICD-10-CM | POA: Diagnosis not present

## 2021-07-13 DIAGNOSIS — F332 Major depressive disorder, recurrent severe without psychotic features: Secondary | ICD-10-CM

## 2021-07-13 MED ORDER — KLONOPIN 0.5 MG PO TABS: 0.5000 mg | ORAL_TABLET | Freq: Three times a day (TID) | ORAL | 0 refills | Status: DC | PRN

## 2021-07-13 MED ORDER — WELLBUTRIN XL 300 MG PO TB24: 300.0000 mg | ORAL_TABLET | Freq: Every day | ORAL | 1 refills | Status: DC

## 2021-07-13 NOTE — Assessment & Plan Note (Signed)
>>  ASSESSMENT AND PLAN FOR MAJOR DEPRESSIVE DISORDER WRITTEN ON 07/13/2021  8:17 AM BY O'SULLIVAN, Debraann Livingstone, NP  Wt Readings from Last 3 Encounters:  07/13/21 117 lb 12.8 oz (53.4 kg)  04/24/21 119 lb (54 kg)  01/14/21 119 lb (54 kg)   PHQ9 SCORE ONLY 07/13/2021 04/24/2021 12/29/2020  PHQ-9 Total Score 14 18 16    Having a lot of stress at home. Encouraged her to continue counseling. Continue Wellbutrin  xl 300mg  once daily. She is requesting brand name.

## 2021-07-13 NOTE — Assessment & Plan Note (Addendum)
Wt Readings from Last 3 Encounters:  ?07/13/21 117 lb 12.8 oz (53.4 kg)  ?04/24/21 119 lb (54 kg)  ?01/14/21 119 lb (54 kg)  ? ?PHQ9 SCORE ONLY 07/13/2021 04/24/2021 12/29/2020  ?PHQ-9 Total Score '14 18 16  '$ ? ?Having a lot of stress at home. Encouraged her to continue counseling. Continue Wellbutrin xl '300mg'$  once daily. She is requesting brand name.  ?

## 2021-07-13 NOTE — Telephone Encounter (Signed)
PA initiated via Covermymeds; KEY: [id number]. Awaiting determination.  ?

## 2021-07-13 NOTE — Telephone Encounter (Signed)
PA initiated via Covermymeds; KEY: [id number]. Awaiting determination.  ?

## 2021-07-13 NOTE — Assessment & Plan Note (Signed)
Uncontrolled.  ?GAD 7 : Generalized Anxiety Score 07/13/2021 04/24/2021 12/29/2020 05/12/2020  ?Nervous, Anxious, on Edge '3 3 3 3  '$ ?Control/stop worrying '3 3 3 3  '$ ?Worry too much - different things '3 3 3 2  '$ ?Trouble relaxing '3 3 3 2  '$ ?Restless '3 3 3 2  '$ ?Easily annoyed or irritable 0 2 0 0  ?Afraid - awful might happen '3 3 3 3  '$ ?Total GAD 7 Score '18 20 18 15  '$ ?Anxiety Difficulty - - - Not difficult at all  ? ?Uncontrolled- but very situational.  Continue buspar, klonopin. She will continue counseling.  ? ?

## 2021-07-13 NOTE — Progress Notes (Signed)
Subjective:   By signing my name below, I, Shannon [name], attest that this documentation has been prepared under the direction and in the presence of [name], NP 07/13/2021        Patient ID: Shannon [name], female    DOB: 1960-11-14, 61 y.o.   MRN: [id number]  Chief Complaint  Patient presents with   Anxiety    Here for follow up    HPI Patient is in today for an office visit and 3 month f/u  Anxiety- She reports she has been unable to sleep and cannot calm her mind down. It is mostly due to family issues. She started seeing Claudie Leach at [facility] and reports he is helping with nutritional supplements because she forgets to eat sometimes especially on days she is very anxious. However, she does not think he is good for talk therapy. She is willing to keep seeing him. She had been on various anxiety medications but was worried about weight gain. She adds that they recently changed her medication at the pharmacy from brand to generic and they have not been very effective. She is requesting for a refill on 0.5 mg klonopin.  Immunizations- She will schedule the shingles vaccine for later.  Past Medical History:  Diagnosis Date   Anemia    Bilateral thumb pain 01/12/2018   Cervical spondylosis without myelopathy 02/12/2016   Chronic midline posterior neck pain 06/19/2015   Concussion without loss of consciousness 06/18/2017   Generalized anxiety disorder with panic attacks 12/30/2010   History of eating disorder    anorexia nervosa in her 2022/08/03   Kidney stones    passed on their own   Low back pain 03/03/2020   Lumbar radicular pain 06/19/2015   Major depressive disorder 12/23/2012   Migraine headaches    Osteoarthritis of carpometacarpal (CMC) joint of thumb 01/10/2020   Postlaminectomy syndrome, lumbar region 06/19/2015   Raynaud's disease 03/23/2013   Sacroiliac dysfunction 06/19/2015   Spondylosis of lumbar region without myelopathy or radiculopathy  07/15/2015   Stomach disorder    due to complications with cholecystectomy "almost died"    Past Surgical History:  Procedure Laterality Date   APPENDECTOMY  Aug 03, 1974   BREAST BIOPSY Right    CHOLECYSTECTOMY  08/2009   reports history of gallbladder polyps, she reports stents in duct of lushca    HYSTEROSCOPY  03-Aug-2011   LAMINECTOMY  1994   L4-5   TONSILLECTOMY  1982   TUBAL LIGATION      Family History  Problem Relation Age of Onset   Hypertension Mother    Memory loss Mother    Dementia Mother    Alcoholism Mother    Diabetes Father    Hypertension Father    Hypothyroidism Father    CAD Father    Stroke Father    Hashimoto's thyroiditis Sister    Hypothyroidism Sister    Neuropathy Sister        chronic inflammatory demyelinating peripheral neuropathy from lyme disease   Alcohol abuse Maternal Grandmother    Cancer Paternal Grandfather        lung cancer   Hypothyroidism Other     Social History   Socioeconomic History   Marital status: Married    Spouse name: Not on file   Number of children: 3   Years of education: 15   Highest education level: Associate degree: academic program  Occupational History    Employer: SELF EMPLOYED  Tobacco Use   Smoking status: Never  Smokeless tobacco: Never  Vaping Use   Vaping Use: Never used  Substance and Sexual Activity   Alcohol use: Yes    Comment: occ.   Drug use: Not Currently   Sexual activity: Yes    Partners: Male    Birth control/protection: Surgical  Other Topics Concern   Not on file  Social History Narrative   Regular exercise:  Yes (swim instructor)   Caffeine Use:  1 daily   Married 3 children ages 17 son, 9 son, and 23 daughter   Associates degree   Right handed          Social Determinants of Health   Financial Resource Strain: Not on file  Food Insecurity: Not on file  Transportation Needs: Not on file  Physical Activity: Not on file  Stress: Not on file  Social Connections: Not on file   Intimate Partner Violence: Not on file    Outpatient Medications Prior to Visit  Medication Sig Dispense Refill   busPIRone (BUSPAR) 15 MG tablet Take 1 tablet (15 mg total) by mouth 2 (two) times daily. 180 tablet 1   Plecanatide (TRULANCE) 3 MG TABS Take by mouth.     traMADol (ULTRAM) 50 MG tablet tramadol 50 mg tablet  TAKE ONE TABLET BY MOUTH EVERY 4 TO 6 HOURS AS NEEDED     buPROPion (WELLBUTRIN XL) 300 MG 24 hr tablet Take 1 tablet (300 mg total) by mouth daily. 90 tablet 1   clonazePAM (KLONOPIN) 0.5 MG tablet TAKE ONE TABLET BY MOUTH THREE TIMES A DAY AS NEEDED FOR ANXIETY 80 tablet 0   No facility-administered medications prior to visit.    Allergies  Allergen Reactions   Buprenorphine Hcl     Other reaction(s): Other (See Comments) No relief   Cortisporin [Neomycin-Polymyxin-Hc] Swelling    Ear dropps   Eszopiclone     Headaches   Flexeril [Cyclobenzaprine Hcl] Other (See Comments)    Mood swings   Gabapentin     sedation   Morphine And Related Other (See Comments)    No relief   Nsaids Other (See Comments)    GI bleed   Paroxetine Nausea Only        Shellfish Allergy Other (See Comments)    skin & mouth tingle   Tizanidine Hcl Other (See Comments)    Dizziness, mood swings   Tolmetin     Other reaction(s): Other (See Comments) GI bleed    Review of Systems  Constitutional:  Negative for fever.  HENT:  Negative for ear pain and hearing loss.        (-)nystagmus (-)adenopathy  Eyes:  Negative for blurred vision.  Respiratory:  Negative for cough, shortness of breath and wheezing.   Cardiovascular:  Negative for chest pain and leg swelling.  Gastrointestinal:  Negative for blood in stool, diarrhea, nausea and vomiting.  Genitourinary:  Negative for dysuria and frequency.  Musculoskeletal:  Negative for joint pain and myalgias.  Skin:  Negative for rash.  Neurological:  Negative for headaches.  Psychiatric/Behavioral:  Negative for depression. The  patient is nervous/anxious.       Objective:    Physical Exam Constitutional:      General: She is not in acute distress.    Appearance: Normal appearance. She is not ill-appearing.  HENT:     Head: Normocephalic and atraumatic.     Right Ear: External ear normal.     Left Ear: External ear normal.  Eyes:     Extraocular  Movements: Extraocular movements intact.     Pupils: Pupils are equal, round, and reactive to light.  Cardiovascular:     Rate and Rhythm: Normal rate and regular rhythm.     Pulses: Normal pulses.     Heart sounds: Normal heart sounds. No murmur heard. Pulmonary:     Effort: Pulmonary effort is normal. No respiratory distress.     Breath sounds: Normal breath sounds. No wheezing or rhonchi.  Abdominal:     General: Bowel sounds are normal. There is no distension.     Palpations: Abdomen is soft.     Tenderness: There is no abdominal tenderness. There is no guarding or rebound.  Musculoskeletal:     Cervical back: Neck supple.  Lymphadenopathy:     Cervical: No cervical adenopathy.  Skin:    General: Skin is warm and dry.  Neurological:     Mental Status: She is alert and oriented to person, place, and time.  Psychiatric:        Behavior: Behavior normal.        Judgment: Judgment normal.    BP 121/70 (BP Location: Right Arm, Patient Position: Sitting, Cuff Size: Small)    Pulse 76    Temp 97.9 F (36.6 C) (Oral)    Resp 16    Wt 117 lb 12.8 oz (53.4 kg)    SpO2 100%    BMI 19.01 kg/m  Wt Readings from Last 3 Encounters:  07/13/21 117 lb 12.8 oz (53.4 kg)  04/24/21 119 lb (54 kg)  01/14/21 119 lb (54 kg)    Diabetic Foot Exam - Simple   No data filed    Lab Results  Component Value Date   WBC 4.3 01/14/2021   HGB 14.0 01/14/2021   HCT 41.3 01/14/2021   PLT 298.0 01/14/2021   GLUCOSE 98 05/05/2016   CHOL 171 03/13/2015   TRIG 66.0 03/13/2015   HDL 58.40 03/13/2015   LDLCALC 100 (H) 03/13/2015   ALT 11 03/13/2015   AST 16 03/13/2015    NA 138 05/05/2016   K 3.8 05/05/2016   CL 102 05/05/2016   CREATININE 0.82 05/05/2016   BUN 12 05/05/2016   CO2 31 05/05/2016   TSH 1.12 10/01/2020   HGBA1C 5.3 03/08/2012    Lab Results  Component Value Date   TSH 1.12 10/01/2020   Lab Results  Component Value Date   WBC 4.3 01/14/2021   HGB 14.0 01/14/2021   HCT 41.3 01/14/2021   MCV 95.3 01/14/2021   PLT 298.0 01/14/2021   Lab Results  Component Value Date   NA 138 05/05/2016   K 3.8 05/05/2016   CO2 31 05/05/2016   GLUCOSE 98 05/05/2016   BUN 12 05/05/2016   CREATININE 0.82 05/05/2016   BILITOT 0.6 03/13/2015   ALKPHOS 54 03/13/2015   AST 16 03/13/2015   ALT 11 03/13/2015   PROT 6.8 03/13/2015   ALBUMIN 4.1 03/13/2015   CALCIUM 8.7 05/05/2016   GFR 76.99 05/05/2016   Lab Results  Component Value Date   CHOL 171 03/13/2015   Lab Results  Component Value Date   HDL 58.40 03/13/2015   Lab Results  Component Value Date   LDLCALC 100 (H) 03/13/2015   Lab Results  Component Value Date   TRIG 66.0 03/13/2015   Lab Results  Component Value Date   CHOLHDL 3 03/13/2015   Lab Results  Component Value Date   HGBA1C 5.3 03/08/2012       Assessment & Plan:  Problem List Items Addressed This Visit       Unprioritized   Major depressive disorder - Primary    Wt Readings from Last 3 Encounters:  07/13/21 117 lb 12.8 oz (53.4 kg)  04/24/21 119 lb (54 kg)  01/14/21 119 lb (54 kg)   PHQ9 SCORE ONLY 07/13/2021 04/24/2021 12/29/2020  PHQ-9 Total Score '14 18 16  '$ Having a lot of stress at home. Encouraged her to continue counseling. Continue Wellbutrin xl '300mg'$  once daily. She is requesting brand name.       Relevant Medications   WELLBUTRIN XL 300 MG 24 hr tablet   Generalized anxiety disorder with panic attacks    Uncontrolled.  GAD 7 : Generalized Anxiety Score 07/13/2021 04/24/2021 12/29/2020 05/12/2020  Nervous, Anxious, on Edge '3 3 3 3  '$ Control/stop worrying '3 3 3 3  '$ Worry too much - different  things '3 3 3 2  '$ Trouble relaxing '3 3 3 2  '$ Restless '3 3 3 2  '$ Easily annoyed or irritable 0 2 0 0  Afraid - awful might happen '3 3 3 3  '$ Total GAD 7 Score '18 20 18 15  '$ Anxiety Difficulty - - - Not difficult at all  Uncontrolled- but very situational.  Continue buspar, klonopin. She will continue counseling.        Relevant Medications   WELLBUTRIN XL 300 MG 24 hr tablet   23 minutes spent with pt today.  The majority of the time was spent counseling pt on anxiety/depression.  Meds ordered this encounter  Medications   KLONOPIN 0.5 MG tablet    Sig: Take 1 tablet (0.5 mg total) by mouth 3 (three) times daily as needed for anxiety.    Dispense:  80 tablet    Refill:  0    Order Specific Question:   Supervising Provider    Answer:   Penni Homans A [[id number]]   WELLBUTRIN XL 300 MG 24 hr tablet    Sig: Take 1 tablet (300 mg total) by mouth daily.    Dispense:  90 tablet    Refill:  1    Order Specific Question:   Supervising Provider    Answer:   Penni Homans A [[id number]]    I,Shannon [name],acting as a scribe for [name], NP.,have documented all relevant documentation on the behalf of [name], NP,as directed by  [name], NP while in the presence of [name], NP.   I, [name], NP , personally preformed the services described in this documentation.  All medical record entries made by the scribe were at my direction and in my presence.  I have reviewed the chart and discharge instructions (if applicable) and agree that the record reflects my personal performance and is accurate and complete.07/13/2021

## 2021-07-14 NOTE — Telephone Encounter (Signed)
PA denied. Awaiting denial information.  °

## 2021-07-14 NOTE — Telephone Encounter (Signed)
Caryl Pina with [employer] called stating medication was denied because it is not on patient's drug formulary and pt must try generic (alprazolam and lorazepam) first.  CB# for Caryl Pina with any questions is [phone], option 3, then option 1.  She also stated information regarding this decision would be forthcoming to the office in writing. ?

## 2021-07-16 MED ORDER — CLONAZEPAM 0.5 MG PO TABS: 0.5000 mg | ORAL_TABLET | Freq: Two times a day (BID) | ORAL | 0 refills | Status: DC | PRN

## 2021-07-16 NOTE — Addendum Note (Signed)
Addended by: [name] on: 07/16/2021 11:02 AM ? ? Modules accepted: Orders ? ?

## 2021-07-20 ENCOUNTER — Ambulatory Visit: Payer: BC Managed Care – PPO | Admitting: Family

## 2021-07-20 DIAGNOSIS — M25551 Pain in right hip: Secondary | ICD-10-CM | POA: Diagnosis not present

## 2021-07-20 DIAGNOSIS — M5416 Radiculopathy, lumbar region: Secondary | ICD-10-CM | POA: Diagnosis not present

## 2021-07-21 NOTE — Telephone Encounter (Signed)
PA denied. Awaiting denial information.  °

## 2021-07-22 NOTE — Telephone Encounter (Signed)
Courtesy call from [place] [phone], option 3)  stating denial and paperwork to be forthcoming on denial and other alternatives pt can try. ?

## 2021-07-23 MED ORDER — BUPROPION HCL ER (XL) 300 MG PO TB24: 300.0000 mg | ORAL_TABLET | Freq: Every day | ORAL | 1 refills | Status: DC

## 2021-07-23 NOTE — Telephone Encounter (Signed)
PA denied. Insurance does not cover brand name. The member must try and fail ALL formulary alternatives, one of which has to be generic bupropion. Alternatives meds: generic bupropion, fluoxetine capsules, sertraline, mirtazapine, citalopram, fluvoxamine, etc.  ? ?

## 2021-07-23 NOTE — Telephone Encounter (Signed)
I resent a generic Wellbutrin rx to her pharmacy.  ?

## 2021-07-24 DIAGNOSIS — J0100 Acute maxillary sinusitis, unspecified: Secondary | ICD-10-CM | POA: Diagnosis not present

## 2021-07-24 DIAGNOSIS — R509 Fever, unspecified: Secondary | ICD-10-CM | POA: Diagnosis not present

## 2021-07-24 DIAGNOSIS — R059 Cough, unspecified: Secondary | ICD-10-CM | POA: Diagnosis not present

## 2021-07-24 NOTE — Telephone Encounter (Signed)
Called but no answer, Lvm for patient to be aware of denial and prescription.  ?

## 2021-07-29 DIAGNOSIS — F422 Mixed obsessional thoughts and acts: Secondary | ICD-10-CM | POA: Diagnosis not present

## 2021-07-29 DIAGNOSIS — F341 Dysthymic disorder: Secondary | ICD-10-CM | POA: Diagnosis not present

## 2021-07-29 DIAGNOSIS — Z630 Problems in relationship with spouse or partner: Secondary | ICD-10-CM | POA: Diagnosis not present

## 2021-07-29 DIAGNOSIS — Z638 Other specified problems related to primary support group: Secondary | ICD-10-CM | POA: Diagnosis not present

## 2021-08-04 DIAGNOSIS — M5416 Radiculopathy, lumbar region: Secondary | ICD-10-CM | POA: Diagnosis not present

## 2021-08-04 DIAGNOSIS — M25551 Pain in right hip: Secondary | ICD-10-CM | POA: Diagnosis not present

## 2021-08-11 DIAGNOSIS — M5416 Radiculopathy, lumbar region: Secondary | ICD-10-CM | POA: Diagnosis not present

## 2021-08-11 DIAGNOSIS — M25551 Pain in right hip: Secondary | ICD-10-CM | POA: Diagnosis not present

## 2021-08-12 DIAGNOSIS — Z638 Other specified problems related to primary support group: Secondary | ICD-10-CM | POA: Diagnosis not present

## 2021-08-12 DIAGNOSIS — F4310 Post-traumatic stress disorder, unspecified: Secondary | ICD-10-CM | POA: Diagnosis not present

## 2021-08-12 DIAGNOSIS — F341 Dysthymic disorder: Secondary | ICD-10-CM | POA: Diagnosis not present

## 2021-08-14 ENCOUNTER — Other Ambulatory Visit: Payer: Self-pay | Admitting: Family

## 2021-08-17 DIAGNOSIS — M25532 Pain in left wrist: Secondary | ICD-10-CM | POA: Diagnosis not present

## 2021-08-17 DIAGNOSIS — M25531 Pain in right wrist: Secondary | ICD-10-CM | POA: Diagnosis not present

## 2021-08-17 DIAGNOSIS — M13841 Other specified arthritis, right hand: Secondary | ICD-10-CM | POA: Diagnosis not present

## 2021-08-28 DIAGNOSIS — M25551 Pain in right hip: Secondary | ICD-10-CM | POA: Diagnosis not present

## 2021-08-28 DIAGNOSIS — M5416 Radiculopathy, lumbar region: Secondary | ICD-10-CM | POA: Diagnosis not present

## 2021-09-01 DIAGNOSIS — Z630 Problems in relationship with spouse or partner: Secondary | ICD-10-CM | POA: Diagnosis not present

## 2021-09-01 DIAGNOSIS — Z638 Other specified problems related to primary support group: Secondary | ICD-10-CM | POA: Diagnosis not present

## 2021-09-01 DIAGNOSIS — F341 Dysthymic disorder: Secondary | ICD-10-CM | POA: Diagnosis not present

## 2021-09-01 DIAGNOSIS — F4310 Post-traumatic stress disorder, unspecified: Secondary | ICD-10-CM | POA: Diagnosis not present

## 2021-09-04 DIAGNOSIS — Z79899 Other long term (current) drug therapy: Secondary | ICD-10-CM | POA: Diagnosis not present

## 2021-09-04 DIAGNOSIS — S71152A Open bite, left thigh, initial encounter: Secondary | ICD-10-CM | POA: Diagnosis not present

## 2021-09-04 DIAGNOSIS — S71132A Puncture wound without foreign body, left thigh, initial encounter: Secondary | ICD-10-CM | POA: Diagnosis not present

## 2021-09-04 DIAGNOSIS — F418 Other specified anxiety disorders: Secondary | ICD-10-CM | POA: Diagnosis not present

## 2021-09-04 DIAGNOSIS — W540XXA Bitten by dog, initial encounter: Secondary | ICD-10-CM | POA: Diagnosis not present

## 2021-09-04 DIAGNOSIS — Z23 Encounter for immunization: Secondary | ICD-10-CM | POA: Diagnosis not present

## 2021-09-07 DIAGNOSIS — W540XXD Bitten by dog, subsequent encounter: Secondary | ICD-10-CM | POA: Diagnosis not present

## 2021-09-07 DIAGNOSIS — Z4800 Encounter for change or removal of nonsurgical wound dressing: Secondary | ICD-10-CM | POA: Diagnosis not present

## 2021-09-07 DIAGNOSIS — S71132D Puncture wound without foreign body, left thigh, subsequent encounter: Secondary | ICD-10-CM | POA: Diagnosis not present

## 2021-09-07 DIAGNOSIS — Z203 Contact with and (suspected) exposure to rabies: Secondary | ICD-10-CM | POA: Diagnosis not present

## 2021-09-07 DIAGNOSIS — Z23 Encounter for immunization: Secondary | ICD-10-CM | POA: Diagnosis not present

## 2021-09-07 DIAGNOSIS — Z791 Long term (current) use of non-steroidal anti-inflammatories (NSAID): Secondary | ICD-10-CM | POA: Diagnosis not present

## 2021-09-07 DIAGNOSIS — Z79899 Other long term (current) drug therapy: Secondary | ICD-10-CM | POA: Diagnosis not present

## 2021-09-07 DIAGNOSIS — S71152D Open bite, left thigh, subsequent encounter: Secondary | ICD-10-CM | POA: Diagnosis not present

## 2021-09-09 DIAGNOSIS — Z630 Problems in relationship with spouse or partner: Secondary | ICD-10-CM | POA: Diagnosis not present

## 2021-09-09 DIAGNOSIS — Z638 Other specified problems related to primary support group: Secondary | ICD-10-CM | POA: Diagnosis not present

## 2021-09-09 DIAGNOSIS — F341 Dysthymic disorder: Secondary | ICD-10-CM | POA: Diagnosis not present

## 2021-09-09 DIAGNOSIS — F422 Mixed obsessional thoughts and acts: Secondary | ICD-10-CM | POA: Diagnosis not present

## 2021-09-11 DIAGNOSIS — Z203 Contact with and (suspected) exposure to rabies: Secondary | ICD-10-CM | POA: Diagnosis not present

## 2021-09-11 DIAGNOSIS — Z23 Encounter for immunization: Secondary | ICD-10-CM | POA: Diagnosis not present

## 2021-10-01 ENCOUNTER — Ambulatory Visit: Payer: BC Managed Care – PPO | Admitting: Neurology

## 2021-10-02 DIAGNOSIS — M5459 Other low back pain: Secondary | ICD-10-CM | POA: Diagnosis not present

## 2021-10-05 DIAGNOSIS — L570 Actinic keratosis: Secondary | ICD-10-CM | POA: Diagnosis not present

## 2021-10-05 DIAGNOSIS — L578 Other skin changes due to chronic exposure to nonionizing radiation: Secondary | ICD-10-CM | POA: Diagnosis not present

## 2021-10-05 DIAGNOSIS — L821 Other seborrheic keratosis: Secondary | ICD-10-CM | POA: Diagnosis not present

## 2021-10-07 ENCOUNTER — Other Ambulatory Visit: Payer: Self-pay | Admitting: Family

## 2021-10-07 DIAGNOSIS — Z638 Other specified problems related to primary support group: Secondary | ICD-10-CM | POA: Diagnosis not present

## 2021-10-07 DIAGNOSIS — M25551 Pain in right hip: Secondary | ICD-10-CM | POA: Diagnosis not present

## 2021-10-07 DIAGNOSIS — M25552 Pain in left hip: Secondary | ICD-10-CM | POA: Diagnosis not present

## 2021-10-07 DIAGNOSIS — F4310 Post-traumatic stress disorder, unspecified: Secondary | ICD-10-CM | POA: Diagnosis not present

## 2021-10-07 DIAGNOSIS — F341 Dysthymic disorder: Secondary | ICD-10-CM | POA: Diagnosis not present

## 2021-10-07 DIAGNOSIS — Z62811 Personal history of psychological abuse in childhood: Secondary | ICD-10-CM | POA: Diagnosis not present

## 2021-10-12 DIAGNOSIS — M5459 Other low back pain: Secondary | ICD-10-CM | POA: Diagnosis not present

## 2021-10-12 DIAGNOSIS — M961 Postlaminectomy syndrome, not elsewhere classified: Secondary | ICD-10-CM | POA: Diagnosis not present

## 2021-10-21 DIAGNOSIS — Z638 Other specified problems related to primary support group: Secondary | ICD-10-CM | POA: Diagnosis not present

## 2021-10-21 DIAGNOSIS — F341 Dysthymic disorder: Secondary | ICD-10-CM | POA: Diagnosis not present

## 2021-10-21 DIAGNOSIS — Z62811 Personal history of psychological abuse in childhood: Secondary | ICD-10-CM | POA: Diagnosis not present

## 2021-10-21 DIAGNOSIS — F4310 Post-traumatic stress disorder, unspecified: Secondary | ICD-10-CM | POA: Diagnosis not present

## 2021-11-04 ENCOUNTER — Ambulatory Visit (INDEPENDENT_AMBULATORY_CARE_PROVIDER_SITE_OTHER): Payer: BC Managed Care – PPO | Admitting: Family

## 2021-11-04 DIAGNOSIS — F411 Generalized anxiety disorder: Secondary | ICD-10-CM | POA: Diagnosis not present

## 2021-11-04 DIAGNOSIS — F410 Panic disorder [episodic paroxysmal anxiety] without agoraphobia: Secondary | ICD-10-CM | POA: Diagnosis not present

## 2021-11-04 MED ORDER — CLONAZEPAM 0.5 MG PO TABS: 0.5000 mg | ORAL_TABLET | Freq: Three times a day (TID) | ORAL | 0 refills | Status: DC | PRN

## 2021-11-04 NOTE — Assessment & Plan Note (Signed)
Uncontrolled. She continues her work with her Social worker. Continue wellbutrin/klonopin/buspar.  Increased quantity of klonopin to 90 tabs/month. She is advised only to take if she needs it and not to exceed 3 tabs/day.  I also encouraged her to schedule an appointment with psychiatry for ongoing management.

## 2021-11-04 NOTE — Progress Notes (Signed)
Subjective:     Patient ID: Shannon [name], female    DOB: [birth year], 61 y.o.   MRN: [id number]  Chief Complaint  Patient presents with   Anxiety    Complains of increased anxyety    Anxiety     Patient is in today to discuss increased anxiety. Notes increased stressors.  Notes that she had stress with her son this past spring when the went to [employer].   Was attacked by a pit bull in May, had to have rabies shots/wound infection.    She continues to worry about her aging parents who are struggling to live alone, but decline placement.   She continues wellbutrin and buspar.  Uses 2-3 tabs of klonopin/day. Requesting to increase quantity to 90 tabs/month instead of 80.  She states that she discontinued the tramadol prescribed her Dr. Nelva Bush (pain management) as it was not helping her pain.   Health Maintenance Due  Topic Date Due   Zoster Vaccines- Shingrix (1 of 2) Never done   PAP SMEAR-Modifier  02/02/2021    Past Medical History:  Diagnosis Date   Anemia    Bilateral thumb pain 01/12/2018   Cervical spondylosis without myelopathy 02/12/2016   Chronic midline posterior neck pain 06/19/2015   Concussion without loss of consciousness 06/18/2017   Generalized anxiety disorder with panic attacks 12/30/2010   History of eating disorder    anorexia nervosa in her 2022-07-16   Kidney stones    passed on their own   Low back pain 03/03/2020   Lumbar radicular pain 06/19/2015   Major depressive disorder 12/23/2012   Migraine headaches    Osteoarthritis of carpometacarpal (CMC) joint of thumb 01/10/2020   Postlaminectomy syndrome, lumbar region 06/19/2015   Raynaud's disease 03/23/2013   Sacroiliac dysfunction 06/19/2015   Spondylosis of lumbar region without myelopathy or radiculopathy 2015/07/16   Stomach disorder    due to complications with cholecystectomy "almost died"    Past Surgical History:  Procedure Laterality Date   APPENDECTOMY  07-16-1974   BREAST  BIOPSY Right    CHOLECYSTECTOMY  08/2009   reports history of gallbladder polyps, she reports stents in duct of lushca    HYSTEROSCOPY  07-16-2011   LAMINECTOMY  1994   L4-5   TONSILLECTOMY  1982   TUBAL LIGATION      Family History  Problem Relation Age of Onset   Hypertension Mother    Memory loss Mother    Dementia Mother    Alcoholism Mother    Diabetes Father    Hypertension Father    Hypothyroidism Father    CAD Father    Stroke Father    Hashimoto's thyroiditis Sister    Hypothyroidism Sister    Neuropathy Sister        chronic inflammatory demyelinating peripheral neuropathy from lyme disease   Alcohol abuse Maternal Grandmother    Cancer Paternal Grandfather        lung cancer   Hypothyroidism Other     Social History   Socioeconomic History   Marital status: Married    Spouse name: Not on file   Number of children: 3   Years of education: 15   Highest education level: Associate degree: academic program  Occupational History    Employer: SELF EMPLOYED  Tobacco Use   Smoking status: Never   Smokeless tobacco: Never  Vaping Use   Vaping Use: Never used  Substance and Sexual Activity   Alcohol use: Yes    Comment: occ.  Drug use: Not Currently   Sexual activity: Yes    Partners: Male    Birth control/protection: Surgical  Other Topics Concern   Not on file  Social History Narrative   Regular exercise:  Yes (swim instructor)   Caffeine Use:  1 daily   Married 3 children ages 76 son, 38 son, and 58 daughter   Associates degree   Right handed          Social Determinants of Health   Financial Resource Strain: Not on file  Food Insecurity: Not on file  Transportation Needs: Not on file  Physical Activity: Not on file  Stress: Not on file  Social Connections: Not on file  Intimate Partner Violence: Not on file    Outpatient Medications Prior to Visit  Medication Sig Dispense Refill   buPROPion (WELLBUTRIN XL) 300 MG 24 hr tablet Take 1 tablet  (300 mg total) by mouth daily. 90 tablet 1   busPIRone (BUSPAR) 15 MG tablet Take 1 tablet (15 mg total) by mouth 2 (two) times daily. 180 tablet 1   clonazePAM (KLONOPIN) 0.5 MG tablet TAKE ONE TABLET BY MOUTH TWICE A DAY AS NEEDED FOR ANXIETY 80 tablet 0   Plecanatide (TRULANCE) 3 MG TABS Take by mouth.     traMADol (ULTRAM) 50 MG tablet tramadol 50 mg tablet  TAKE ONE TABLET BY MOUTH EVERY 4 TO 6 HOURS AS NEEDED     No facility-administered medications prior to visit.    Allergies  Allergen Reactions   Buprenorphine Hcl     Other reaction(s): Other (See Comments) No relief   Cortisporin [Neomycin-Polymyxin-Hc] Swelling    Ear dropps   Eszopiclone     Headaches   Flexeril [Cyclobenzaprine Hcl] Other (See Comments)    Mood swings   Gabapentin     sedation   Morphine And Related Other (See Comments)    No relief   Nsaids Other (See Comments)    GI bleed   Paroxetine Nausea Only        Shellfish Allergy Other (See Comments)    skin & mouth tingle   Tizanidine Hcl Other (See Comments)    Dizziness, mood swings   Tolmetin     Other reaction(s): Other (See Comments) GI bleed    ROS     Objective:    Physical Exam Constitutional:      Appearance: Normal appearance.  Pulmonary:     Effort: Pulmonary effort is normal.  Neurological:     Mental Status: She is alert and oriented to person, place, and time.  Psychiatric:        Attention and Perception: Attention normal.        Mood and Affect: Mood is anxious. Affect is tearful.        Speech: Speech normal.        Behavior: Behavior normal.     BP 118/73 (BP Location: Right Arm, Patient Position: Sitting, Cuff Size: Small)   Pulse 74   Temp 98.7 F (37.1 C) (Oral)   Resp 16   Wt 114 lb (51.7 kg)   SpO2 100%   BMI 18.40 kg/m  Wt Readings from Last 3 Encounters:  11/04/21 114 lb (51.7 kg)  07/13/21 117 lb 12.8 oz (53.4 kg)  04/24/21 119 lb (54 kg)       Assessment & Plan:   Problem List Items  Addressed This Visit       Unprioritized   Generalized anxiety disorder with panic attacks  Uncontrolled. She continues her work with her Social worker. Continue wellbutrin/klonopin/buspar.  Increased quantity of klonopin to 90 tabs/month. She is advised only to take if she needs it and not to exceed 3 tabs/day.  I also encouraged her to schedule an appointment with psychiatry for ongoing management.        I have discontinued Shannon [name]'s traMADol and Trulance. I have also changed her clonazePAM. Additionally, I am having her maintain her busPIRone and buPROPion.  Meds ordered this encounter  Medications   clonazePAM (KLONOPIN) 0.5 MG tablet    Sig: Take 1 tablet (0.5 mg total) by mouth 3 (three) times daily as needed for anxiety.    Dispense:  90 tablet    Refill:  0    Order Specific Question:   Supervising Provider    Answer:   Penni Homans A [[id number]]

## 2021-11-04 NOTE — Patient Instructions (Signed)
Psychiatric Services:  Dr. Chucky May [facility]) [phone]

## 2021-11-06 DIAGNOSIS — M5451 Vertebrogenic low back pain: Secondary | ICD-10-CM | POA: Diagnosis not present

## 2021-11-06 DIAGNOSIS — M25562 Pain in left knee: Secondary | ICD-10-CM | POA: Diagnosis not present

## 2021-11-19 DIAGNOSIS — Z653 Problems related to other legal circumstances: Secondary | ICD-10-CM | POA: Diagnosis not present

## 2021-11-19 DIAGNOSIS — Z638 Other specified problems related to primary support group: Secondary | ICD-10-CM | POA: Diagnosis not present

## 2021-11-19 DIAGNOSIS — F341 Dysthymic disorder: Secondary | ICD-10-CM | POA: Diagnosis not present

## 2021-11-25 DIAGNOSIS — G5732 Lesion of lateral popliteal nerve, left lower limb: Secondary | ICD-10-CM | POA: Diagnosis not present

## 2021-11-25 DIAGNOSIS — M25562 Pain in left knee: Secondary | ICD-10-CM | POA: Diagnosis not present

## 2021-11-26 DIAGNOSIS — M5416 Radiculopathy, lumbar region: Secondary | ICD-10-CM | POA: Diagnosis not present

## 2021-11-30 ENCOUNTER — Other Ambulatory Visit: Payer: Self-pay | Admitting: Specialist

## 2021-11-30 DIAGNOSIS — M545 Low back pain, unspecified: Secondary | ICD-10-CM

## 2021-11-30 DIAGNOSIS — M25562 Pain in left knee: Secondary | ICD-10-CM | POA: Diagnosis not present

## 2021-11-30 DIAGNOSIS — M5451 Vertebrogenic low back pain: Secondary | ICD-10-CM | POA: Diagnosis not present

## 2021-12-01 ENCOUNTER — Other Ambulatory Visit: Payer: Self-pay | Admitting: Family

## 2021-12-01 ENCOUNTER — Other Ambulatory Visit: Payer: Self-pay | Admitting: Specialist

## 2021-12-01 DIAGNOSIS — M545 Low back pain, unspecified: Secondary | ICD-10-CM

## 2021-12-02 ENCOUNTER — Other Ambulatory Visit: Payer: Self-pay | Admitting: Specialist

## 2021-12-02 ENCOUNTER — Ambulatory Visit
Admission: RE | Admit: 2021-12-02 | Discharge: 2021-12-02 | Disposition: A | Discharge status: Home / Self Care | Payer: Self-pay | Source: Ambulatory Visit | Attending: Specialist | Admitting: Specialist

## 2021-12-02 ENCOUNTER — Ambulatory Visit
Admission: RE | Admit: 2021-12-02 | Discharge: 2021-12-02 | Disposition: A | Discharge status: Home / Self Care | Payer: BC Managed Care – PPO | Source: Ambulatory Visit | Attending: Specialist | Admitting: Specialist

## 2021-12-02 DIAGNOSIS — M545 Low back pain, unspecified: Secondary | ICD-10-CM

## 2021-12-02 DIAGNOSIS — M5416 Radiculopathy, lumbar region: Secondary | ICD-10-CM | POA: Diagnosis not present

## 2021-12-02 DIAGNOSIS — M47816 Spondylosis without myelopathy or radiculopathy, lumbar region: Secondary | ICD-10-CM | POA: Diagnosis not present

## 2021-12-02 DIAGNOSIS — M5126 Other intervertebral disc displacement, lumbar region: Secondary | ICD-10-CM | POA: Diagnosis not present

## 2021-12-02 MED ORDER — IOPAMIDOL (ISOVUE-M 200) INJECTION 41%
20.0000 mL | Freq: Once | INTRAMUSCULAR | Status: AC
  Administered 2021-12-02: 20 mL via INTRATHECAL

## 2021-12-02 MED ORDER — DIAZEPAM 5 MG PO TABS: 10.0000 mg | ORAL_TABLET | Freq: Once | ORAL | Status: DC

## 2021-12-02 MED ORDER — MEPERIDINE HCL 50 MG/ML IJ SOLN
50.0000 mg | Freq: Once | INTRAMUSCULAR | Status: AC | PRN
  Administered 2021-12-02: 50 mg via INTRAMUSCULAR

## 2021-12-02 MED ORDER — ONDANSETRON HCL 4 MG/2ML IJ SOLN
4.0000 mg | Freq: Once | INTRAMUSCULAR | Status: AC | PRN
  Administered 2021-12-02: 4 mg via INTRAMUSCULAR

## 2021-12-02 NOTE — Discharge Instructions (Signed)

## 2021-12-02 NOTE — Discharge Instr - Other Orders (Addendum)
1325: pt reports pain 10/10 from myelogram procedure. See MAR.  4469: Pt reports pain is starting to get better. 7/10 now. Pt resting in nursing recovery area. No other complaints at this time.

## 2021-12-03 ENCOUNTER — Other Ambulatory Visit: Payer: Self-pay | Admitting: Family

## 2021-12-03 NOTE — Telephone Encounter (Signed)
Requesting: clonazepam 0.'5mg'$   Contract: 08/29/19 UDS: 12/29/20 Last Visit: 11/04/21 Next Visit: 02/03/22 Last Refill: 11/04/21 #90 and 0RF  Please Advise

## 2021-12-08 DIAGNOSIS — M961 Postlaminectomy syndrome, not elsewhere classified: Secondary | ICD-10-CM | POA: Diagnosis not present

## 2021-12-08 DIAGNOSIS — M5416 Radiculopathy, lumbar region: Secondary | ICD-10-CM | POA: Diagnosis not present

## 2021-12-17 MED ORDER — BUPROPION HCL ER (XL) 300 MG PO TB24: 300.0000 mg | ORAL_TABLET | Freq: Every day | ORAL | 1 refills | Status: DC

## 2021-12-17 NOTE — Telephone Encounter (Signed)
Opened in error

## 2022-01-05 ENCOUNTER — Other Ambulatory Visit: Payer: Self-pay | Admitting: Family

## 2022-01-28 DIAGNOSIS — M5451 Vertebrogenic low back pain: Secondary | ICD-10-CM | POA: Diagnosis not present

## 2022-01-28 DIAGNOSIS — M792 Neuralgia and neuritis, unspecified: Secondary | ICD-10-CM | POA: Diagnosis not present

## 2022-02-03 ENCOUNTER — Ambulatory Visit: Payer: BC Managed Care – PPO | Admitting: Family

## 2022-02-03 ENCOUNTER — Encounter: Payer: Self-pay | Admitting: Family

## 2022-02-03 VITALS — BP 110/78 | HR 67 | Temp 98.2°F | Resp 18 | Ht 66.0 in | Wt 111.4 lb

## 2022-02-03 DIAGNOSIS — F411 Generalized anxiety disorder: Secondary | ICD-10-CM | POA: Diagnosis not present

## 2022-02-03 DIAGNOSIS — F332 Major depressive disorder, recurrent severe without psychotic features: Secondary | ICD-10-CM

## 2022-02-03 DIAGNOSIS — F4321 Adjustment disorder with depressed mood: Secondary | ICD-10-CM

## 2022-02-03 DIAGNOSIS — F410 Panic disorder [episodic paroxysmal anxiety] without agoraphobia: Secondary | ICD-10-CM

## 2022-02-03 DIAGNOSIS — F4320 Adjustment disorder, unspecified: Secondary | ICD-10-CM

## 2022-02-03 HISTORY — DX: Adjustment disorder, unspecified: F43.20

## 2022-02-03 HISTORY — DX: Adjustment disorder with depressed mood: F43.21

## 2022-02-03 MED ORDER — CLONAZEPAM 0.5 MG PO TABS: 0.5000 mg | ORAL_TABLET | Freq: Three times a day (TID) | ORAL | 0 refills | Status: DC | PRN

## 2022-02-03 NOTE — Assessment & Plan Note (Signed)
>>  ASSESSMENT AND PLAN FOR MAJOR DEPRESSIVE DISORDER WRITTEN ON 02/03/2022  7:29 AM BY O'SULLIVAN, Cledis Sohn, NP  Overall stable on wellbutrin .

## 2022-02-03 NOTE — Assessment & Plan Note (Addendum)
Has been using klonopin regularly .  Refill sent. Continue prn klonopin as well as as buspar.  She has had some weight loss accompanying this stressful situation. I encouraged her to focus on eating enough food.

## 2022-02-03 NOTE — Assessment & Plan Note (Signed)
Overall stable on wellbutrin.

## 2022-02-03 NOTE — Patient Instructions (Signed)
[place] [phone]) .

## 2022-02-03 NOTE — Assessment & Plan Note (Signed)
New.  I encouraged her to seek another therapist.  I gave her the information for the mood treatment center as well as [place].

## 2022-02-03 NOTE — Progress Notes (Signed)
Subjective:     Patient ID: Shannon [name], female    DOB: 1960-06-22, 61 y.o.   MRN: [id number]  Chief Complaint  Patient presents with   Follow-up    HPI Patient is in today for follow up.  Father died December 26, 2022 unexpectedly.  Mom fell and broke 2 vertebral fractures.  Mom is in SNF but recently diagnosed with Lewy body syndrome and is having behavioral problems. She has been back and forth to the ER and the SNF with her mom which has been very stressful. Notes that she has not been able to grieve the loss of her father because she has been so stressed with her mother's situation.   Wt Readings from Last 3 Encounters:  02/03/22 111 lb 6.4 oz (50.5 kg)  11/04/21 114 lb (51.7 kg)  07/13/21 117 lb 12.8 oz (53.4 kg)   She was working with a therapist, but notes that she has not been back recently because she is not sure he is the right person to help her through this.  Health Maintenance Due  Topic Date Due   Zoster Vaccines- Shingrix (1 of 2) Never done   PAP SMEAR-Modifier  02/02/2021   INFLUENZA VACCINE  12/01/2021   MAMMOGRAM  12/29/2021    Past Medical History:  Diagnosis Date   Anemia    Bilateral thumb pain 01/12/2018   Cervical spondylosis without myelopathy 02/12/2016   Chronic midline posterior neck pain 06/19/2015   Concussion without loss of consciousness 06/18/2017   Generalized anxiety disorder with panic attacks 12/30/2010   History of eating disorder    anorexia nervosa in her 18-Jul-2022   Kidney stones    passed on their own   Low back pain 03/03/2020   Lumbar radicular pain 06/19/2015   Major depressive disorder 12/23/2012   Migraine headaches    Osteoarthritis of carpometacarpal (CMC) joint of thumb 01/10/2020   Postlaminectomy syndrome, lumbar region 06/19/2015   Raynaud's disease 03/23/2013   Sacroiliac dysfunction 06/19/2015   Spondylosis of lumbar region without myelopathy or radiculopathy July 18, 2015   Stomach disorder    due to complications  with cholecystectomy "almost died"    Past Surgical History:  Procedure Laterality Date   APPENDECTOMY  07-18-74   BREAST BIOPSY Right    CHOLECYSTECTOMY  08/2009   reports history of gallbladder polyps, she reports stents in duct of lushca    HYSTEROSCOPY  18-Jul-2011   LAMINECTOMY  1994   L4-5   TONSILLECTOMY  1982   TUBAL LIGATION      Family History  Problem Relation Age of Onset   Hypertension Mother    Memory loss Mother    Dementia Mother    Alcoholism Mother    Diabetes Father    Hypertension Father    Hypothyroidism Father    CAD Father    Stroke Father    Hashimoto's thyroiditis Sister    Hypothyroidism Sister    Neuropathy Sister        chronic inflammatory demyelinating peripheral neuropathy from lyme disease   Alcohol abuse Maternal Grandmother    Cancer Paternal Grandfather        lung cancer   Hypothyroidism Other     Social History   Socioeconomic History   Marital status: Married    Spouse name: Not on file   Number of children: 3   Years of education: 15   Highest education level: Associate degree: academic program  Occupational History    Employer: SELF EMPLOYED  Tobacco Use  Smoking status: Never   Smokeless tobacco: Never  Vaping Use   Vaping Use: Never used  Substance and Sexual Activity   Alcohol use: Yes    Comment: occ.   Drug use: Not Currently   Sexual activity: Yes    Partners: Male    Birth control/protection: Surgical  Other Topics Concern   Not on file  Social History Narrative   Regular exercise:  Yes (swim instructor)   Caffeine Use:  1 daily   Married 3 children ages 23 son, 59 son, and 44 daughter   Associates degree   Right handed          Social Determinants of Health   Financial Resource Strain: Not on file  Food Insecurity: Not on file  Transportation Needs: Not on file  Physical Activity: Not on file  Stress: Not on file  Social Connections: Not on file  Intimate Partner Violence: Not on file     Outpatient Medications Prior to Visit  Medication Sig Dispense Refill   buPROPion (WELLBUTRIN XL) 300 MG 24 hr tablet Take 1 tablet (300 mg total) by mouth daily. 90 tablet 1   busPIRone (BUSPAR) 15 MG tablet TAKE ONE TABLET BY MOUTH TWICE A DAY 180 tablet 1   Tapentadol HCl (NUCYNTA) 100 MG TABS Take 1 tablet 3 times a day by oral route as needed.     clonazePAM (KLONOPIN) 0.5 MG tablet TAKE ONE TABLET BY MOUTH THREE TIMES A DAY AS NEEDED FOR ANXIETY 90 tablet 0   No facility-administered medications prior to visit.    Allergies  Allergen Reactions   Buprenorphine Hcl     Other reaction(s): Other (See Comments) No relief   Cortisporin [Neomycin-Polymyxin-Hc] Swelling    Ear dropps   Eszopiclone     Headaches   Flexeril [Cyclobenzaprine Hcl] Other (See Comments)    Mood swings   Gabapentin     sedation   Morphine And Related Other (See Comments)    No relief   Nsaids Other (See Comments)    GI bleed   Paroxetine Nausea Only        Shellfish Allergy Other (See Comments)    skin & mouth tingle   Tizanidine Hcl Other (See Comments)    Dizziness, mood swings   Tolmetin     Other reaction(s): Other (See Comments) GI bleed    ROS See HPI    Objective:    Physical Exam Constitutional:      Appearance: She is underweight.  Cardiovascular:     Rate and Rhythm: Normal rate.  Pulmonary:     Effort: Pulmonary effort is normal.  Neurological:     Mental Status: She is alert and oriented to person, place, and time.  Psychiatric:        Attention and Perception: Attention normal.        Mood and Affect: Mood is anxious.        Speech: Speech normal.        Behavior: Behavior normal.        Thought Content: Thought content normal.     BP 110/78   Pulse 67   Temp 98.2 F (36.8 C)   Resp 18   Ht '5\' 6"'$  (1.676 m)   Wt 111 lb 6.4 oz (50.5 kg)   SpO2 99%   BMI 17.98 kg/m  Wt Readings from Last 3 Encounters:  02/03/22 111 lb 6.4 oz (50.5 kg)  11/04/21 114 lb  (51.7 kg)  07/13/21 117 lb 12.8  oz (53.4 kg)       Assessment & Plan:   Problem List Items Addressed This Visit       Unprioritized   Major depressive disorder    Overall stable on wellbutrin.       Grief reaction - Primary    New.  I encouraged her to seek another therapist.  I gave her the information for the [facility] as well as [facility].      Generalized anxiety disorder with panic attacks    Has been using klonopin regularly .  Refill sent. Continue prn klonopin as well as as buspar.  She has had some weight loss accompanying this stressful situation. I encouraged her to focus on eating enough food.         I have changed Shannon [name]'s clonazePAM. I am also having her maintain her busPIRone, buPROPion, and Nucynta.  Meds ordered this encounter  Medications   clonazePAM (KLONOPIN) 0.5 MG tablet    Sig: Take 1 tablet (0.5 mg total) by mouth 3 (three) times daily as needed. for anxiety    Dispense:  90 tablet    Refill:  0    Order Specific Question:   Supervising Provider    Answer:   Penni Homans A [[id number]]

## 2022-02-05 ENCOUNTER — Ambulatory Visit: Payer: BC Managed Care – PPO | Admitting: Neurology

## 2022-02-05 ENCOUNTER — Encounter: Payer: Self-pay | Admitting: Neurology

## 2022-02-05 VITALS — BP 118/60 | HR 78 | Ht 66.0 in | Wt 113.4 lb

## 2022-02-05 DIAGNOSIS — R4189 Other symptoms and signs involving cognitive functions and awareness: Secondary | ICD-10-CM

## 2022-02-05 DIAGNOSIS — M5416 Radiculopathy, lumbar region: Secondary | ICD-10-CM | POA: Diagnosis not present

## 2022-02-05 DIAGNOSIS — G629 Polyneuropathy, unspecified: Secondary | ICD-10-CM

## 2022-02-05 NOTE — Progress Notes (Signed)
NEUROLOGY FOLLOW UP OFFICE NOTE  Shannon [name] [id number] 1961-02-17  HISTORY OF PRESENT ILLNESS: I had the pleasure of seeing Shannon [name] in follow-up in the neurology clinic on 02/05/2022. She is alone in the office today. The patient was last seen over a year ago for memory loss. She also presents today for evaluation of common peroneal neuropathy. Records and images were personally reviewed where available.  She underwent Neuropsychological testing in December 26, 2020 with Dr. Melvyn Novas with a diagnosis of severe anxiety and depression. On testing, it was felt that there was no suggestion of an ongoing memory impairment, there was no suggestion of a deficit in information storage or rapid forgetting. All other cognitive domains were appropriate. Most likely culprit for subjective cognitive dysfunction and variability noted on testing was felt due to ongoing moderate to severe psychiatric distress, sleep dysfunction, and chronic pain. No compelling evidence for early onset AD or any other neurodegenerative condition. She is undergoing a lot of stress, her father passed away in December 26, 2020 then her mother moved to [facility]. Memory is "bad." She has been seeing a therapist but stopped in the summer when her father got sick.  She presents today reporting back pain that started over a year ago. She has a history of lumbar decompression at L4-5 30 years ago which was mostly affecting her right leg, however now symptoms are on the left leg. She used to swim and walk regularly, then noticed pain worsening when swimming. Pain starts in her back and radiates down the left buttock then leg, wrapping under her ankle and going into her toes. She describes occasional shooting pain down her leg, but also a constant pain radiating down her leg. She has tingling on the left leg. Pain worsens when sitting or doing activities. Sometimes her left foot catches on the floor. No falls. No bowel/bladder  involvement. She has been seeing Ortho and felt better briefly after injections. She tried Gabapentin (caused side effects), Tramadol, Percocet, Hydrocodone, with not much help. She is on Nucynta which does help better, she takes one a day, reducing pain from 8/10 to 4/10. She has had several tests done, including MRI lumbar spine reporting postsurgical changes at L4-5 without spinal canal stenosis, moderate left and mild right neural foraminal narrowing; small right anteromedially directed synovial cyst at L5-S1 with mild impingement and ventral medial displacement of descending right S1 nerve roots; mild left L3-4 neural foraminal narrowing, mild spinal canal stenosis; mild right L1-2 neural foraminal narrowing. CT myelogram showed left lateral recess stenosis at L3-4 due to a combination of shallow disc protrusion and facet and ligamentous hypertrophy, likely to compress the left L4 nerve. She had an EMG/NCV, report unavailable for review, per notes "indicates neuropathy of the peroneal nerve common demyelinating and axonal though not at the level of the fibular head." No prior trauma to the area. She does not cross her legs. No alcohol use. Her sister has CIDP.     History On Initial Assessment 10/01/2020: This is a pleasant 61 year old right-handed woman with a history of remote migraines, back surgery, anxiety, depression, presenting for evaluation of memory loss. She feels her memory is "sometimes okay, other times not okay." She lives with her husband, family have sometimes made comments that she just asked the same question or they had just told her this. She denies getting lost driving. She denies missing medications or bill payments. She denies misplacing things frequently or leaving the stove on. She states there is  a lot going on, her mother has symptoms suggestive of dementia and refuses care, and because of family history, she is concerned for herself. She cares for both parents who need help,  seeing them daily. She has a lot of panic attacks and takes clonazepam 2-3 times a day. She has mild headaches that she attributes to stress. She has occasional swallowing difficulties that she feels is anxiety-related. She has intermittent tingling in both feet and low back pain. No dizziness, diplopia, dysarthria, neck pain, bowel/bladder dysfunction, anosmia, or tremors. Aside from her mother, her father is also having memory changes since a stroke. She has had at least 3 concussions with loss of consciousness, most recently 3 years ago. She drinks alcohol socially. She is very active, exercising regularly, teaches swimming. She states she has held it together through all this.    Laboratory Data: Lab Results  Component Value Date   TSH 1.12 10/01/2020   Lab Results  Component Value Date   VITAMINB12 308 10/01/2020    PAST MEDICAL HISTORY: Past Medical History:  Diagnosis Date   Anemia    Bilateral thumb pain 01/12/2018   Cervical spondylosis without myelopathy 02/12/2016   Chronic midline posterior neck pain 06/19/2015   Concussion without loss of consciousness 06/18/2017   Generalized anxiety disorder with panic attacks 12/30/2010   History of eating disorder    anorexia nervosa in her 2022/08/14   Kidney stones    passed on their own   Low back pain 03/03/2020   Lumbar radicular pain 06/19/2015   Major depressive disorder 12/23/2012   Migraine headaches    Osteoarthritis of carpometacarpal (CMC) joint of thumb 01/10/2020   Postlaminectomy syndrome, lumbar region 06/19/2015   Raynaud's disease 03/23/2013   Sacroiliac dysfunction 06/19/2015   Spondylosis of lumbar region without myelopathy or radiculopathy Aug 14, 2015   Stomach disorder    due to complications with cholecystectomy "almost died"    MEDICATIONS: Current Outpatient Medications on File Prior to Visit  Medication Sig Dispense Refill   buPROPion (WELLBUTRIN XL) 300 MG 24 hr tablet Take 1 tablet (300 mg total) by  mouth daily. 90 tablet 1   busPIRone (BUSPAR) 15 MG tablet TAKE ONE TABLET BY MOUTH TWICE A DAY 180 tablet 1   clonazePAM (KLONOPIN) 0.5 MG tablet Take 1 tablet (0.5 mg total) by mouth 3 (three) times daily as needed. for anxiety 90 tablet 0   Tapentadol HCl (NUCYNTA) 100 MG TABS Take 1 tablet 3 times a day by oral route as needed.     No current facility-administered medications on file prior to visit.    ALLERGIES: Allergies  Allergen Reactions   Buprenorphine Hcl     Other reaction(s): Other (See Comments) No relief   Cortisporin [Neomycin-Polymyxin-Hc] Swelling    Ear dropps   Eszopiclone     Headaches   Flexeril [Cyclobenzaprine Hcl] Other (See Comments)    Mood swings   Gabapentin     sedation   Morphine And Related Other (See Comments)    No relief   Nsaids Other (See Comments)    GI bleed   Paroxetine Nausea Only        Shellfish Allergy Other (See Comments)    skin & mouth tingle   Tizanidine Hcl Other (See Comments)    Dizziness, mood swings   Tolmetin     Other reaction(s): Other (See Comments) GI bleed    FAMILY HISTORY: Family History  Problem Relation Age of Onset   Hypertension Mother  Memory loss Mother    Dementia Mother    Alcoholism Mother    Diabetes Father    Hypertension Father    Hypothyroidism Father    CAD Father    Stroke Father    Hashimoto's thyroiditis Sister    Hypothyroidism Sister    Neuropathy Sister        chronic inflammatory demyelinating peripheral neuropathy from lyme disease   Alcohol abuse Maternal Grandmother    Cancer Paternal Grandfather        lung cancer   Hypothyroidism Other     SOCIAL HISTORY: Social History   Socioeconomic History   Marital status: Married    Spouse name: Not on file   Number of children: 3   Years of education: 15   Highest education level: Associate degree: academic program  Occupational History    Employer: SELF EMPLOYED  Tobacco Use   Smoking status: Never   Smokeless  tobacco: Never  Vaping Use   Vaping Use: Never used  Substance and Sexual Activity   Alcohol use: Yes    Comment: occ.   Drug use: Not Currently   Sexual activity: Yes    Partners: Male    Birth control/protection: Surgical  Other Topics Concern   Not on file  Social History Narrative   Regular exercise:  Yes (swim instructor)   Caffeine Use:  1 daily   Married 3 children ages 40 son, 59 son, and 66 daughter   Associates degree   Right handed          Social Determinants of Health   Financial Resource Strain: Not on file  Food Insecurity: Not on file  Transportation Needs: Not on file  Physical Activity: Not on file  Stress: Not on file  Social Connections: Not on file  Intimate Partner Violence: Not on file     PHYSICAL EXAM: Vitals:   02/05/22 1002  BP: 118/60  Pulse: 78  SpO2: 97%   General: No acute distress Head:  Normocephalic/atraumatic Skin/Extremities: No rash, no edema Neurological Exam: alert and awake. No aphasia or dysarthria. Fund of knowledge is appropriate. Attention and concentration are normal.   Cranial nerves: Pupils equal, round. Extraocular movements intact with no nystagmus. Visual fields full.  No facial asymmetry.  Motor: Bulk and tone normal, muscle strength 5/5 throughout except with pain on left hip flexion. Slight weakness of 1st and 2nd digit on right hand (chronic). Sensation intact to all modalities on both UE. Intact temperature on both LE, decreased pin on dorsum of left foot, decreased vibration sense to both ankles. Reflexes +2 throughout, no ankle clonus. Finger to nose testing intact.  Gait slightly antalgic due to back/left leg pain. No ataxia.  Romberg negative.   IMPRESSION: This is a pleasant 61 yo RH woman seen in our office a year ago for memory concerns. Neuropsychological evaluation in 2022 did not show any evidence of a neurodegenerative condition, subjective memory concerns likely due to severe anxiety and depression. She  continues to deal with significant stressors, we discussed follow-up in 8 months to further evaluate symptoms as stressors hopefully reduce in that period of time. Main concern today is her back pain radiating down left leg, consistent with radicular pain with CT myelogram showing left lateral recess stenosis at L3-4 due to a combination of shallow disc protrusion and facet and ligamentous hypertrophy, likely to compress the left L4 nerve. She had an EMG/NCV in June/July, full report unavailable for review, notes state it "indicates neuropathy of  the peroneal nerve common demyelinating and axonal though not at the level of the fibular head." Due to these other findings and her sister's history of CIDP, she has been referred for further evaluation. Her neurological exam shows normal strength, no foot drop noted, there is slight decreased sensation on dorsum of foot. I will have to review the EMG/NCV report, this will be requested for review. We discussed that if anything further is needed after reviewing EMG report, this will be discussed with patient. Follow-up in 8 months, call for any changes.      Thank you for allowing me to participate in her care.  Please do not hesitate to call for any questions or concerns.    Ellouise Newer, M.D.   CC: [name], NP, Dr. Tonita Cong

## 2022-02-05 NOTE — Patient Instructions (Signed)
Good to see you. I hope we get you feeling better soon. I will request for the EMG results and see if there is anything else we need to do. Follow-up with me in 8 months, call for any changes.    RECOMMENDATIONS FOR ALL PATIENTS WITH MEMORY PROBLEMS: 1. Continue to exercise (Recommend 30 minutes of walking everyday, or 3 hours every week) 2. Increase social interactions - continue going to [place] and enjoy social gatherings with friends and family 3. Eat healthy, avoid fried foods and eat more fruits and vegetables 4. Maintain adequate blood pressure, blood sugar, and blood cholesterol level. Reducing the risk of stroke and cardiovascular disease also helps promoting better memory. 5. Avoid stressful situations. Live a simple life and avoid aggravations. Organize your time and prepare for the next day in anticipation. 6. Sleep well, avoid any interruptions of sleep and avoid any distractions in the bedroom that may interfere with adequate sleep quality 7. Avoid sugar, avoid sweets as there is a strong link between excessive sugar intake, diabetes, and cognitive impairment We discussed the Mediterranean diet, which has been shown to help patients reduce the risk of progressive memory disorders and reduces cardiovascular risk. This includes eating fish, eat fruits and green leafy vegetables, nuts like almonds and hazelnuts, walnuts, and also use olive oil. Avoid fast foods and fried foods as much as possible. Avoid sweets and sugar as sugar use has been linked to worsening of memory function.

## 2022-02-18 DIAGNOSIS — M5416 Radiculopathy, lumbar region: Secondary | ICD-10-CM | POA: Diagnosis not present

## 2022-03-01 ENCOUNTER — Encounter: Payer: Self-pay | Admitting: Family

## 2022-03-01 ENCOUNTER — Other Ambulatory Visit: Payer: Self-pay | Admitting: Family

## 2022-03-01 DIAGNOSIS — Z0000 Encounter for general adult medical examination without abnormal findings: Secondary | ICD-10-CM

## 2022-03-01 NOTE — Telephone Encounter (Signed)
Requesting:clonazepam 0.'5mg'$   Contract: 08/29/19 UDS: 12/29/20 Last Visit: 02/03/22 Next Visit: None Last Refill: 02/03/22 #90 and 0RF   Please Advise

## 2022-03-03 DIAGNOSIS — M5451 Vertebrogenic low back pain: Secondary | ICD-10-CM | POA: Diagnosis not present

## 2022-03-03 DIAGNOSIS — M792 Neuralgia and neuritis, unspecified: Secondary | ICD-10-CM | POA: Diagnosis not present

## 2022-03-08 ENCOUNTER — Ambulatory Visit: Payer: BC Managed Care – PPO | Admitting: Professional

## 2022-03-10 ENCOUNTER — Ambulatory Visit: Payer: Self-pay | Admitting: Orthopedic Surgery

## 2022-03-10 DIAGNOSIS — M48062 Spinal stenosis, lumbar region with neurogenic claudication: Secondary | ICD-10-CM

## 2022-03-17 ENCOUNTER — Ambulatory Visit (HOSPITAL_BASED_OUTPATIENT_CLINIC_OR_DEPARTMENT_OTHER)
Admission: RE | Admit: 2022-03-17 | Discharge: 2022-03-17 | Disposition: A | Discharge status: Home / Self Care | Payer: BC Managed Care – PPO | Source: Ambulatory Visit | Attending: Family | Admitting: Family

## 2022-03-17 ENCOUNTER — Encounter (HOSPITAL_BASED_OUTPATIENT_CLINIC_OR_DEPARTMENT_OTHER): Payer: Self-pay

## 2022-03-17 DIAGNOSIS — Z0000 Encounter for general adult medical examination without abnormal findings: Secondary | ICD-10-CM | POA: Diagnosis not present

## 2022-03-17 DIAGNOSIS — Z1231 Encounter for screening mammogram for malignant neoplasm of breast: Secondary | ICD-10-CM | POA: Insufficient documentation

## 2022-04-01 ENCOUNTER — Other Ambulatory Visit: Payer: Self-pay | Admitting: Family

## 2022-04-02 ENCOUNTER — Other Ambulatory Visit (HOSPITAL_COMMUNITY): Payer: BC Managed Care – PPO

## 2022-04-05 ENCOUNTER — Ambulatory Visit: Payer: Self-pay | Admitting: Orthopedic Surgery

## 2022-04-05 ENCOUNTER — Ambulatory Visit: Payer: BC Managed Care – PPO | Admitting: Behavioral Health

## 2022-04-05 NOTE — H&P (Signed)
Shannon [name] is an 61 y.o. female.   Chief Complaint: back and leg pain HPI: Reason for Visit: (normal) visit for: (back) Location (Lower Extremity): lower back pain ; leg pain bilateral, left side is worse, Severity: pain level 7/10 Quality: sharp; aching; burning; stabbing; throbbing Aggravating Factors: walking for ; sitting for Associated Symptoms: weakness (slight LLE); no numbness/tingling Medications: helping a little; Nucynta Notes: 10/19/23L L4 SNRB short term releif 90% relief for 1 day from the L4 selective nerve root block. She generalized neurologic disorder. Felt it was primarily coming from the back. Saw her neurologist who did not feel that this was a problem in terms of the peroneal nerve or was this a  Past Medical History:  Diagnosis Date   Anemia    Bilateral thumb pain 01/12/2018   Cervical spondylosis without myelopathy 02/12/2016   Chronic midline posterior neck pain 06/19/2015   Concussion without loss of consciousness 06/18/2017   Generalized anxiety disorder with panic attacks 12/30/2010   History of eating disorder    anorexia nervosa in her 07-29-22   Kidney stones    passed on their own   Low back pain 03/03/2020   Lumbar radicular pain 06/19/2015   Major depressive disorder 12/23/2012   Migraine headaches    Osteoarthritis of carpometacarpal (CMC) joint of thumb 01/10/2020   Postlaminectomy syndrome, lumbar region 06/19/2015   Raynaud's disease 03/23/2013   Sacroiliac dysfunction 06/19/2015   Spondylosis of lumbar region without myelopathy or radiculopathy 07/15/2015   Stomach disorder    due to complications with cholecystectomy "almost died"    Past Surgical History:  Procedure Laterality Date   APPENDECTOMY  07/29/74   BREAST BIOPSY Right    CHOLECYSTECTOMY  08/2009   reports history of gallbladder polyps, she reports stents in duct of lushca    HYSTEROSCOPY  July 29, 2011   LAMINECTOMY  1994   L4-5   TONSILLECTOMY  1982   TUBAL LIGATION       Family History  Problem Relation Age of Onset   Hypertension Mother    Memory loss Mother    Dementia Mother    Alcoholism Mother    Diabetes Father    Hypertension Father    Hypothyroidism Father    CAD Father    Stroke Father    Hashimoto's thyroiditis Sister    Hypothyroidism Sister    Neuropathy Sister        chronic inflammatory demyelinating peripheral neuropathy from lyme disease   Alcohol abuse Maternal Grandmother    Cancer Paternal Grandfather        lung cancer   Hypothyroidism Other    Social History:  reports that she has never smoked. She has never used smokeless tobacco. She reports current alcohol use. She reports that she does not currently use drugs.  Allergies:  Allergies  Allergen Reactions   Buprenorphine Hcl     Other reaction(s): Other (See Comments) No relief   Cortisporin [Neomycin-Polymyxin-Hc] Swelling    Ear dropps   Eszopiclone     Headaches   Flexeril [Cyclobenzaprine Hcl] Other (See Comments)    Mood swings   Gabapentin     sedation   Morphine And Related Other (See Comments)    No relief   Nsaids Other (See Comments)    GI bleed   Paroxetine Nausea Only        Shellfish Allergy Other (See Comments)    skin & mouth tingle   Tizanidine Hcl Other (See Comments)    Dizziness, mood swings  Tolmetin     Other reaction(s): Other (See Comments) GI bleed   Current meds: benzonatate 100 mg capsule buPROPion HCL XL 300 mg 24 hr tablet, extended release BUPROPION HYDROCHLORIDE ER (XL) 300 MG TB24 BUSPIRONE HYDROCHLORIDE 7.5 MG TABS clonazePAM 0.5 mg tablet Linzess 290 mcg capsule Nucynta 100 mg tablet SUMATRIPTAN SUCCINATE 50 MG TABS  Review of Systems  Constitutional: Negative.   HENT: Negative.    Eyes: Negative.   Respiratory: Negative.    Cardiovascular: Negative.   Gastrointestinal: Negative.   Endocrine: Negative.   Genitourinary: Negative.   Musculoskeletal:  Positive for back pain and gait problem.   Neurological:  Positive for weakness and numbness.    There were no vitals taken for this visit. Physical Exam Constitutional:      Appearance: Normal appearance.  HENT:     Head: Normocephalic and atraumatic.     Right Ear: External ear normal.     Left Ear: External ear normal.     Nose: Nose normal.     Mouth/Throat:     Pharynx: Oropharynx is clear.  Eyes:     Conjunctiva/sclera: Conjunctivae normal.  Cardiovascular:     Rate and Rhythm: Normal rate and regular rhythm.     Pulses: Normal pulses.     Heart sounds: Normal heart sounds.  Pulmonary:     Effort: Pulmonary effort is normal.     Breath sounds: Normal breath sounds.  Abdominal:     General: Bowel sounds are normal.  Musculoskeletal:     Cervical back: Normal range of motion.     Comments: Gait and Station: Appearance: ambulating with no assistive devices and antalgic gait.  Constitutional: General Appearance: healthy-appearing and distress (mild).  Psychiatric: Mood and Affect: active and alert.  Cardiovascular System: Edema Right: none; Dorsalis and posterior tibial pulses 2+. Edema Left: none.  Abdomen: Inspection and Palpation: non-distended and no tenderness.  Skin: Inspection and palpation: no rash.  Lumbar Spine: Inspection: normal alignment. Bony Palpation of the Lumbar Spine: tender at lumbosacral junction.. Bony Palpation of the Right Hip: no tenderness of the greater trochanter and tenderness of the SI joint; Pelvis stable. Bony Palpation of the Left Hip: no tenderness of the greater trochanter and tenderness of the SI joint. Soft Tissue Palpation on the Right: No flank pain with percussion. Active Range of Motion: limited flexion and extention.  Motor Strength: L1 Motor Strength on the Right: hip flexion iliopsoas 5/5. L1 Motor Strength on the Left: hip flexion iliopsoas 5/5. L2-L4 Motor Strength on the Right: knee extension quadriceps 5/5. L2-L4 Motor Strength on the Left: knee extension quadriceps  5/5. L5 Motor Strength on the Right: ankle dorsiflexion tibialis anterior 5/5 and great toe extension extensor hallucis longus 5/5. L5 Motor Strength on the Left: ankle dorsiflexion tibialis anterior 5/5 and great toe extension extensor hallucis longus 5/5. S1 Motor Strength on the Right: plantar flexion gastrocnemius 5/5. S1 Motor Strength on the Left: plantar flexion gastrocnemius 5/5.  Neurological System: Knee Reflex Right: normal (2). Knee Reflex Left: normal (2). Ankle Reflex Right: normal (2). Ankle Reflex Left: normal (2). Babinski Reflex Right: plantar reflex absent. Babinski Reflex Left: plantar reflex absent. Sensation on the Right: normal distal extremities. Sensation on the Left: normal distal extremities. Special Tests on the Right: no clonus of the ankle/knee. Special Tests on the Left: no clonus of the ankle/knee and seated straight leg raising test positive.  Skin:    General: Skin is warm and dry.  Neurological:  Mental Status: She is alert.    Outside CT myelogram reviewed independently by myself demonstrates severe lateral recess stenosis at L3-4 due to disc herniation and facet hypertrophy. There appears to be L4 nerve root compression. L4-5 and L5-S1 demonstrates no evidence of a neural compressive lesion  EMG and nerve conduction study indicates a common peroneal motor neuropathy with demyelinating and axonal injury noted.  Assessment/Plan Impression:  1. Left lower extremity radicular pain L4, L5 nerve root distribution not classic for L4 nerve root distribution though she had a diagnostic selective nerve root block L4 2. Disc herniation L3-4 with severe lateral recess stenosis compressing the L4 nerve root possibly affecting the L5 nerve root 3. Common peroneal neuropathy at the fibular head with a negative negative MRI neurologist feels that her radiculopathy is coming from her lumbar spine 4. Sister and her family has been diagnosed with a neurologic disorder  neurologist does not feel there is any association with  Plan:  I discussed with the patient and her current situation clearly there is neural compressive lesion L3-4 to the left and it is likely causing the buttock and left lower extremity radicular pain somewhat atypical in L5 nerve root distribution and L4 with temporary relief from the selective nerve root block.  With temporary relief from the L4 selective nerve root block I consider that diagnostic. I reviewed her neurology report. They are in agreement that they feel this pain generators from her lumbar spine. Not peripheral or not genetic.  We discussed a decompression at L3-4. I had an extensive discussion with the patient concerning the pathology relevant anatomy and treatment options. At this point exhausting conservative treatment and in the presence of a neurologic deficit we discussed microlumbar decompression. I discussed the risks and benefits including bleeding, infection, DVT, PE, anesthetic complications, worsening in their symptoms, improvement in their symptoms, C SF leakage, epidural fibrosis, need for future surgeries such as revision discectomy and lumbar fusion. I also indicated that this is an operation to basically decompress the nerve roots to allow recovery as opposed to fixing a herniated disc if it is encountered and that the incidence of recurrent chest disc herniation can approach 15%. Also that nerve root recovery is variable and may not recover completely. Any ligament or bone that is contributing to compressing the nerves will be removed as well.  I discussed the operative course including overnight in the hospital. Immediate ambulation. Follow-up in 2 weeks for suture removal. 6 weeks until healing of the herniation and surgical incision followed by 6 weeks of reconditioning and strengthening of the core musculature. Also discussed the need to employ the concepts of disc pressure management and core motion following  the surgery to minimize the risk of recurrent disc herniation. We will obtain preoperative clearance i if necessary and proceed accordingly.  She is otherwise healthy. Kefzol, oxycodone. No NSAIDs. She is currently on Nucynta from Dr. Nelva Bush  Plan lumbar laminectomy L3-4   Cecilie Kicks, PA-C for Dr Tonita Cong 04/05/2022, 4:46 PM

## 2022-04-05 NOTE — H&P (View-Only) (Signed)
Shannon [name] is an 61 y.o. female.   Chief Complaint: back and leg pain HPI: Reason for Visit: (normal) visit for: (back) Location (Lower Extremity): lower back pain ; leg pain bilateral, left side is worse, Severity: pain level 7/10 Quality: sharp; aching; burning; stabbing; throbbing Aggravating Factors: walking for ; sitting for Associated Symptoms: weakness (slight LLE); no numbness/tingling Medications: helping a little; Nucynta Notes: 10/19/23L L4 SNRB short term releif 90% relief for 1 day from the L4 selective nerve root block. She generalized neurologic disorder. Felt it was primarily coming from the back. Saw her neurologist who did not feel that this was a problem in terms of the peroneal nerve or was this a  Past Medical History:  Diagnosis Date   Anemia    Bilateral thumb pain 01/12/2018   Cervical spondylosis without myelopathy 02/12/2016   Chronic midline posterior neck pain 06/19/2015   Concussion without loss of consciousness 06/18/2017   Generalized anxiety disorder with panic attacks 12/30/2010   History of eating disorder    anorexia nervosa in her 2022/07/23   Kidney stones    passed on their own   Low back pain 03/03/2020   Lumbar radicular pain 06/19/2015   Major depressive disorder 12/23/2012   Migraine headaches    Osteoarthritis of carpometacarpal (CMC) joint of thumb 01/10/2020   Postlaminectomy syndrome, lumbar region 06/19/2015   Raynaud's disease 03/23/2013   Sacroiliac dysfunction 06/19/2015   Spondylosis of lumbar region without myelopathy or radiculopathy 07/15/2015   Stomach disorder    due to complications with cholecystectomy "almost died"    Past Surgical History:  Procedure Laterality Date   APPENDECTOMY  07/23/74   BREAST BIOPSY Right    CHOLECYSTECTOMY  08/2009   reports history of gallbladder polyps, she reports stents in duct of [place]ca    HYSTEROSCOPY  07-23-2011   LAMINECTOMY  1994   L4-5   TONSILLECTOMY  1982   TUBAL LIGATION       Family History  Problem Relation Age of Onset   Hypertension Mother    Memory loss Mother    Dementia Mother    Alcoholism Mother    Diabetes Father    Hypertension Father    Hypothyroidism Father    CAD Father    Stroke Father    Hashimoto's thyroiditis Sister    Hypothyroidism Sister    Neuropathy Sister        chronic inflammatory demyelinating peripheral neuropathy from lyme disease   Alcohol abuse Maternal Grandmother    Cancer Paternal Grandfather        lung cancer   Hypothyroidism Other    Social History:  reports that she has never smoked. She has never used smokeless tobacco. She reports current alcohol use. She reports that she does not currently use drugs.  Allergies:  Allergies  Allergen Reactions   Buprenorphine Hcl     Other reaction(s): Other (See Comments) No relief   Cortisporin [Neomycin-Polymyxin-Hc] Swelling    Ear dropps   Eszopiclone     Headaches   Flexeril [Cyclobenzaprine Hcl] Other (See Comments)    Mood swings   Gabapentin     sedation   Morphine And Related Other (See Comments)    No relief   Nsaids Other (See Comments)    GI bleed   Paroxetine Nausea Only        Shellfish Allergy Other (See Comments)    skin & mouth tingle   Tizanidine Hcl Other (See Comments)    Dizziness, mood swings  Tolmetin     Other reaction(s): Other (See Comments) GI bleed   Current meds: benzonatate 100 mg capsule buPROPion HCL XL 300 mg 24 hr tablet, extended release BUPROPION HYDROCHLORIDE ER (XL) 300 MG TB24 BUSPIRONE HYDROCHLORIDE 7.5 MG TABS clonazePAM 0.5 mg tablet Linzess 290 mcg capsule Nucynta 100 mg tablet SUMATRIPTAN SUCCINATE 50 MG TABS  Review of Systems  Constitutional: Negative.   HENT: Negative.    Eyes: Negative.   Respiratory: Negative.    Cardiovascular: Negative.   Gastrointestinal: Negative.   Endocrine: Negative.   Genitourinary: Negative.   Musculoskeletal:  Positive for back pain and gait problem.   Neurological:  Positive for weakness and numbness.    There were no vitals taken for this visit. Physical Exam Constitutional:      Appearance: Normal appearance.  HENT:     Head: Normocephalic and atraumatic.     Right Ear: External ear normal.     Left Ear: External ear normal.     Nose: Nose normal.     Mouth/Throat:     Pharynx: Oropharynx is clear.  Eyes:     Conjunctiva/sclera: Conjunctivae normal.  Cardiovascular:     Rate and Rhythm: Normal rate and regular rhythm.     Pulses: Normal pulses.     Heart sounds: Normal heart sounds.  Pulmonary:     Effort: Pulmonary effort is normal.     Breath sounds: Normal breath sounds.  Abdominal:     General: Bowel sounds are normal.  Musculoskeletal:     Cervical back: Normal range of motion.     Comments: Gait and Station: Appearance: ambulating with no assistive devices and antalgic gait.  Constitutional: General Appearance: healthy-appearing and distress (mild).  Psychiatric: Mood and Affect: active and alert.  Cardiovascular System: Edema Right: none; Dorsalis and posterior tibial pulses 2+. Edema Left: none.  Abdomen: Inspection and Palpation: non-distended and no tenderness.  Skin: Inspection and palpation: no rash.  Lumbar Spine: Inspection: normal alignment. Bony Palpation of the Lumbar Spine: tender at lumbosacral junction.. Bony Palpation of the Right Hip: no tenderness of the greater trochanter and tenderness of the SI joint; Pelvis stable. Bony Palpation of the Left Hip: no tenderness of the greater trochanter and tenderness of the SI joint. Soft Tissue Palpation on the Right: No flank pain with percussion. Active Range of Motion: limited flexion and extention.  Motor Strength: L1 Motor Strength on the Right: hip flexion iliopsoas 5/5. L1 Motor Strength on the Left: hip flexion iliopsoas 5/5. L2-L4 Motor Strength on the Right: knee extension quadriceps 5/5. L2-L4 Motor Strength on the Left: knee extension quadriceps  5/5. L5 Motor Strength on the Right: ankle dorsiflexion tibialis anterior 5/5 and great toe extension extensor hallucis longus 5/5. L5 Motor Strength on the Left: ankle dorsiflexion tibialis anterior 5/5 and great toe extension extensor hallucis longus 5/5. S1 Motor Strength on the Right: plantar flexion gastrocnemius 5/5. S1 Motor Strength on the Left: plantar flexion gastrocnemius 5/5.  Neurological System: Knee Reflex Right: normal (2). Knee Reflex Left: normal (2). Ankle Reflex Right: normal (2). Ankle Reflex Left: normal (2). Babinski Reflex Right: plantar reflex absent. Babinski Reflex Left: plantar reflex absent. Sensation on the Right: normal distal extremities. Sensation on the Left: normal distal extremities. Special Tests on the Right: no clonus of the ankle/knee. Special Tests on the Left: no clonus of the ankle/knee and seated straight leg raising test positive.  Skin:    General: Skin is warm and dry.  Neurological:  Mental Status: She is alert.    Outside CT myelogram reviewed independently by myself demonstrates severe lateral recess stenosis at L3-4 due to disc herniation and facet hypertrophy. There appears to be L4 nerve root compression. L4-5 and L5-S1 demonstrates no evidence of a neural compressive lesion  EMG and nerve conduction study indicates a common peroneal motor neuropathy with demyelinating and axonal injury noted.  Assessment/Plan Impression:  1. Left lower extremity radicular pain L4, L5 nerve root distribution not classic for L4 nerve root distribution though she had a diagnostic selective nerve root block L4 2. Disc herniation L3-4 with severe lateral recess stenosis compressing the L4 nerve root possibly affecting the L5 nerve root 3. Common peroneal neuropathy at the fibular head with a negative negative MRI neurologist feels that her radiculopathy is coming from her lumbar spine 4. Sister and her family has been diagnosed with a neurologic disorder  neurologist does not feel there is any association with  Plan:  I discussed with the patient and her current situation clearly there is neural compressive lesion L3-4 to the left and it is likely causing the buttock and left lower extremity radicular pain somewhat atypical in L5 nerve root distribution and L4 with temporary relief from the selective nerve root block.  With temporary relief from the L4 selective nerve root block I consider that diagnostic. I reviewed her neurology report. They are in agreement that they feel this pain generators from her lumbar spine. Not peripheral or not genetic.  We discussed a decompression at L3-4. I had an extensive discussion with the patient concerning the pathology relevant anatomy and treatment options. At this point exhausting conservative treatment and in the presence of a neurologic deficit we discussed microlumbar decompression. I discussed the risks and benefits including bleeding, infection, DVT, PE, anesthetic complications, worsening in their symptoms, improvement in their symptoms, C SF leakage, epidural fibrosis, need for future surgeries such as revision discectomy and lumbar fusion. I also indicated that this is an operation to basically decompress the nerve roots to allow recovery as opposed to fixing a herniated disc if it is encountered and that the incidence of recurrent chest disc herniation can approach 15%. Also that nerve root recovery is variable and may not recover completely. Any ligament or bone that is contributing to compressing the nerves will be removed as well.  I discussed the operative course including overnight in the hospital. Immediate ambulation. Follow-up in 2 weeks for suture removal. 6 weeks until healing of the herniation and surgical incision followed by 6 weeks of reconditioning and strengthening of the core musculature. Also discussed the need to employ the concepts of disc pressure management and core motion following  the surgery to minimize the risk of recurrent disc herniation. We will obtain preoperative clearance i if necessary and proceed accordingly.  She is otherwise healthy. Kefzol, oxycodone. No NSAIDs. She is currently on Nucynta from Dr. Nelva Bush  Plan lumbar laminectomy L3-4   Cecilie Kicks, PA-C for Dr Tonita Cong 04/05/2022, 4:46 PM

## 2022-04-06 ENCOUNTER — Encounter: Payer: BC Managed Care – PPO | Admitting: Family

## 2022-04-07 DIAGNOSIS — X32XXXS Exposure to sunlight, sequela: Secondary | ICD-10-CM | POA: Diagnosis not present

## 2022-04-07 DIAGNOSIS — L578 Other skin changes due to chronic exposure to nonionizing radiation: Secondary | ICD-10-CM | POA: Diagnosis not present

## 2022-04-07 DIAGNOSIS — L570 Actinic keratosis: Secondary | ICD-10-CM | POA: Diagnosis not present

## 2022-04-07 DIAGNOSIS — D225 Melanocytic nevi of trunk: Secondary | ICD-10-CM | POA: Diagnosis not present

## 2022-04-07 DIAGNOSIS — L814 Other melanin hyperpigmentation: Secondary | ICD-10-CM | POA: Diagnosis not present

## 2022-04-08 ENCOUNTER — Ambulatory Visit: Payer: BC Managed Care – PPO | Admitting: Psychology

## 2022-04-09 NOTE — Progress Notes (Signed)
Surgical Instructions    Your procedure is scheduled on Thursday December 14th.  Report to [facility] Main Entrance "A" at 5:30 A.M., then check in with the Admitting office.  Call this number if you have problems the morning of surgery:  [phone]   If you have any questions prior to your surgery date call [phone]: Open Monday-Friday 8am-4pm If you experience any cold or flu symptoms such as cough, fever, chills, shortness of breath, etc. between now and your scheduled surgery, please notify us at the above number     Remember:  Do not eat after midnight the night before your surgery  You may drink clear liquids until 4:30am the morning of your surgery.   Clear liquids allowed are: Water, Non-Citrus Juices (without pulp), Carbonated Beverages, Clear Tea, Black Coffee ONLY (NO MILK, CREAM OR POWDERED CREAMER of any kind), and Gatorade    Take these medicines the morning of surgery with A SIP OF WATER: buPROPion (WELLBUTRIN XL) 300 MG 24 hr tablet  LINZESS 290 MCG CAPS capsule  Tapentadol HCl (NUCYNTA) 100 MG TABS   IF NEEDED  clonazePAM (KLONOPIN) 0.5 MG tablet     As of today, STOP taking any Aspirin (unless otherwise instructed by your surgeon) Aleve, Naproxen, Ibuprofen, Motrin, Advil, Goody's, BC's, all herbal medications, fish oil, and all vitamins.           Do not wear jewelry or makeup. Do not wear lotions, powders, perfumes/cologne or deodorant. Do not shave 48 hours prior to surgery.   Do not bring valuables to the hospital. Do not wear nail polish, gel polish, artificial nails, or any other type of covering on natural nails (fingers and toes) If you have artificial nails or gel coating that need to be removed by a nail salon, please have this removed prior to surgery. Artificial nails or gel coating may interfere with anesthesia's ability to adequately monitor your vital signs.  [facility] is not responsible for any belongings or valuables.    Do NOT Smoke  (Tobacco/Vaping)  24 hours prior to your procedure  If you use a CPAP at night, you may bring your mask for your overnight stay.   Contacts, glasses, hearing aids, dentures or partials may not be worn into surgery, please bring cases for these belongings   For patients admitted to the hospital, discharge time will be determined by your treatment team.   Patients discharged the day of surgery will not be allowed to drive home, and someone needs to stay with them for 24 hours.   SURGICAL WAITING ROOM VISITATION Patients having surgery or a procedure may have no more than 2 support people in the waiting area - these visitors may rotate.   Children under the age of 3 must have an adult with them who is not the patient. If the patient needs to stay at the hospital during part of their recovery, the visitor guidelines for inpatient rooms apply. Pre-op nurse will coordinate an appropriate time for 1 support person to accompany patient in pre-op.  This support person may not rotate.   Please refer to [email or web address] for the visitor guidelines for Inpatients (after your surgery is over and you are in a regular room).    Special instructions:    Oral Hygiene is also important to reduce your risk of infection.  Remember - BRUSH YOUR TEETH THE MORNING OF SURGERY WITH YOUR REGULAR TOOTHPASTE   - Preparing For Surgery  Before surgery, you can play an important  role. Because skin is not sterile, your skin needs to be as free of germs as possible. You can reduce the number of germs on your skin by washing with CHG (chlorahexidine gluconate) Soap before surgery.  CHG is an antiseptic cleaner which kills germs and bonds with the skin to continue killing germs even after washing.     Please do not use if you have an allergy to CHG or antibacterial soaps. If your skin becomes reddened/irritated stop using the CHG.  Do not shave (including  legs and underarms) for at least 48 hours prior to first CHG shower. It is OK to shave your face.  Please follow these instructions carefully.     Shower the NIGHT BEFORE SURGERY and the MORNING OF SURGERY with CHG Soap.   If you chose to wash your hair, wash your hair first as usual with your normal shampoo. After you shampoo, rinse your hair and body thoroughly to remove the shampoo.  Then [employer] and genitals (private parts) with your normal soap and rinse thoroughly to remove soap.  After that Use CHG Soap as you would any other liquid soap. You can apply CHG directly to the skin and wash gently with a scrungie or a clean washcloth.   Apply the CHG Soap to your body ONLY FROM THE NECK DOWN.  Do not use on open wounds or open sores. Avoid contact with your eyes, ears, mouth and genitals (private parts). Wash Face and genitals (private parts)  with your normal soap.   Wash thoroughly, paying special attention to the area where your surgery will be performed.  Thoroughly rinse your body with warm water from the neck down.  DO NOT shower/wash with your normal soap after using and rinsing off the CHG Soap.  Pat yourself dry with a CLEAN TOWEL.  Wear CLEAN PAJAMAS to bed the night before surgery  Place CLEAN SHEETS on your bed the night before your surgery  DO NOT SLEEP WITH PETS.   Day of Surgery:  Take a shower with CHG soap. Wear Clean/Comfortable clothing the morning of surgery Do not apply any deodorants/lotions.   Remember to brush your teeth WITH YOUR REGULAR TOOTHPASTE.    If you received a COVID test during your pre-op visit, it is requested that you wear a mask when out in public, stay away from anyone that may not be feeling well, and notify your surgeon if you develop symptoms. If you have been in contact with anyone that has tested positive in the last 10 days, please notify your surgeon.    Please read over the following fact sheets that you were given.

## 2022-04-12 ENCOUNTER — Other Ambulatory Visit: Payer: Self-pay

## 2022-04-12 ENCOUNTER — Encounter (HOSPITAL_COMMUNITY): Payer: Self-pay

## 2022-04-12 ENCOUNTER — Ambulatory Visit (HOSPITAL_COMMUNITY)
Admission: RE | Admit: 2022-04-12 | Discharge: 2022-04-12 | Disposition: A | Discharge status: Home / Self Care | Payer: BC Managed Care – PPO | Source: Ambulatory Visit | Attending: Orthopedic Surgery | Admitting: Orthopedic Surgery

## 2022-04-12 ENCOUNTER — Encounter (HOSPITAL_COMMUNITY)
Admission: RE | Admit: 2022-04-12 | Discharge: 2022-04-12 | Disposition: A | Discharge status: Home / Self Care | Payer: BC Managed Care – PPO | Source: Ambulatory Visit | Attending: Specialist | Admitting: Specialist

## 2022-04-12 VITALS — BP 126/87 | HR 86 | Temp 97.4°F | Resp 18 | Ht 65.0 in | Wt 114.5 lb

## 2022-04-12 DIAGNOSIS — M48062 Spinal stenosis, lumbar region with neurogenic claudication: Secondary | ICD-10-CM | POA: Diagnosis not present

## 2022-04-12 DIAGNOSIS — M4126 Other idiopathic scoliosis, lumbar region: Secondary | ICD-10-CM | POA: Diagnosis not present

## 2022-04-12 DIAGNOSIS — Z01818 Encounter for other preprocedural examination: Secondary | ICD-10-CM | POA: Insufficient documentation

## 2022-04-12 LAB — SURGICAL PCR SCREEN
MRSA, PCR: NEGATIVE
Staphylococcus aureus: NEGATIVE

## 2022-04-12 LAB — CBC
HCT: 44 % (ref 36.0–46.0)
Hemoglobin: 14.5 g/dL (ref 12.0–15.0)
MCH: 31.6 pg (ref 26.0–34.0)
MCHC: 33 g/dL (ref 30.0–36.0)
MCV: 95.9 fL (ref 80.0–100.0)
Platelets: 393 10*3/uL (ref 150–400)
RBC: 4.59 MIL/uL (ref 3.87–5.11)
RDW: 12.5 % (ref 11.5–15.5)
WBC: 5.8 10*3/uL (ref 4.0–10.5)
nRBC: 0 % (ref 0.0–0.2)

## 2022-04-12 NOTE — Pre-Procedure Instructions (Signed)
Surgical Instructions    Your procedure is scheduled on April 15, 2022.  Report to [facility] Main Entrance "A" at 5:30 A.M., then check in with the Admitting office.  Call this number if you have problems the morning of surgery:  [phone]   If you have any questions prior to your surgery date call [phone]: Open Monday-Friday 8am-4pm If you experience any cold or flu symptoms such as cough, fever, chills, shortness of breath, etc. Between now and your scheduled surgery, please notify us at the above number.    Remember:  Do not eat after midnight the night before your surgery  You may drink clear liquids until 4:30 AM the morning of your surgery.   Clear liquids allowed are: Water, Non-Citrus Juices (without pulp), Carbonated Beverages, Clear Tea, Black Coffee Only (NO MILK, CREAM OR POWDERED CREAMER of any kind), and Gatorade.   Patient Instructions  The night before surgery:  No food after midnight. ONLY clear liquids after midnight  The day of surgery (if you do NOT have diabetes):  Drink ONE (1) Pre-Surgery Clear Ensure by 4:30 AM the morning of surgery. Drink in one sitting. Do not sip.  This drink was given to you during your hospital  pre-op appointment visit.  Nothing else to drink after completing the  Pre-Surgery Clear Ensure.         If you have questions, please contact your surgeon's office.    Take these medicines the morning of surgery with A SIP OF WATER:  buPROPion (WELLBUTRIN XL)   LINZESS   Tapentadol HCl (NUCYNTA)   clonazePAM (KLONOPIN) - may take if needed    As of today, STOP taking any Aspirin (unless otherwise instructed by your surgeon) Aleve, Naproxen, Ibuprofen, Motrin, Advil, Goody's, BC's, all herbal medications, fish oil, and all vitamins.                     Do NOT Smoke (Tobacco/Vaping) for 24 hours prior to your procedure.  If you use a CPAP at night, you may bring your mask/headgear for your overnight stay.   Contacts,  glasses, piercing's, hearing aid's, dentures or partials may not be worn into surgery, please bring cases for these belongings.    For patients admitted to the hospital, discharge time will be determined by your treatment team.   Patients discharged the day of surgery will not be allowed to drive home, and someone needs to stay with them for 24 hours.  SURGICAL WAITING ROOM VISITATION Patients having surgery or a procedure may have no more than 2 support people in the waiting area - these visitors may rotate.   Children under the age of 49 must have an adult with them who is not the patient. If the patient needs to stay at the hospital during part of their recovery, the visitor guidelines for inpatient rooms apply. Pre-op nurse will coordinate an appropriate time for 1 support person to accompany patient in pre-op.  This support person may not rotate.   Please refer to the [facility] website for the visitor guidelines for Inpatients (after your surgery is over and you are in a regular room).    Special instructions:   [place]- Preparing For Surgery  Before surgery, you can play an important role. Because skin is not sterile, your skin needs to be as free of germs as possible. You can reduce the number of germs on your skin by washing with CHG (chlorahexidine gluconate) Soap before surgery.  CHG  is an antiseptic cleaner which kills germs and bonds with the skin to continue killing germs even after washing.    Oral Hygiene is also important to reduce your risk of infection.  Remember - BRUSH YOUR TEETH THE MORNING OF SURGERY WITH YOUR REGULAR TOOTHPASTE  Please do not use if you have an allergy to CHG or antibacterial soaps. If your skin becomes reddened/irritated stop using the CHG.  Do not shave (including legs and underarms) for at least 48 hours prior to first CHG shower. It is OK to shave your face.  Please follow these instructions carefully.   Shower the NIGHT BEFORE SURGERY and  the MORNING OF SURGERY  If you chose to wash your hair, wash your hair first as usual with your normal shampoo.  After you shampoo, rinse your hair and body thoroughly to remove the shampoo.  Use CHG Soap as you would any other liquid soap. You can apply CHG directly to the skin and wash gently with a scrungie or a clean washcloth.   Apply the CHG Soap to your body ONLY FROM THE NECK DOWN.  Do not use on open wounds or open sores. Avoid contact with your eyes, ears, mouth and genitals (private parts). Wash Face and genitals (private parts)  with your normal soap.   Wash thoroughly, paying special attention to the area where your surgery will be performed.  Thoroughly rinse your body with warm water from the neck down.  DO NOT shower/wash with your normal soap after using and rinsing off the CHG Soap.  Pat yourself dry with a CLEAN TOWEL.  Wear CLEAN PAJAMAS to bed the night before surgery  Place CLEAN SHEETS on your bed the night before your surgery  DO NOT SLEEP WITH PETS.   Day of Surgery: Take a shower with CHG soap. Do not wear jewelry or makeup Do not wear lotions, powders, perfumes/colognes, or deodorant. Do not shave 48 hours prior to surgery.  Men may shave face and neck. Do not bring valuables to the hospital.  [facility] is not responsible for any belongings or valuables. Do not wear nail polish, gel polish, artificial nails, or any other type of covering on natural nails (fingers and toes) If you have artificial nails or gel coating that need to be removed by a nail salon, please have this removed prior to surgery. Artificial nails or gel coating may interfere with anesthesia's ability to adequately monitor your vital signs. Wear Clean/Comfortable clothing the morning of surgery Remember to brush your teeth WITH YOUR REGULAR TOOTHPASTE.   Please read over the following fact sheets that you were given.    If you received a COVID test during your pre-op visit  it is  requested that you wear a mask when out in public, stay away from anyone that may not be feeling well and notify your surgeon if you develop symptoms. If you have been in contact with anyone that has tested positive in the last 10 days please notify you surgeon.

## 2022-04-12 NOTE — Progress Notes (Signed)
PCP - [name], NP Cardiologist - Denies  PPM/ICD - Denies Device Orders - n/a Rep Notified - n/a  Chest x-ray - n/a EKG - Per pt, had one 2+ years ago during annual exam. Result normal/ Stress Test - Per pt, 15+ years ago. Result was normal ECHO - Denies Cardiac Cath - Denies  Sleep Study - Denies CPAP - n/a  No DM  Last dose of GLP1 agonist- n/a GLP1 instructions: n/a  Blood Thinner Instructions: n/a Aspirin Instructions: n/a  ERAS Protcol - Clear liquids until 0430 morning of surgery PRE-SURGERY Ensure or G2- Ensure given to patient at PAT appointment  COVID TEST- n/a   Anesthesia review: No.   Patient denies shortness of breath, fever, cough and chest pain at PAT appointment   All instructions explained to the patient, with a verbal understanding of the material. Patient agrees to go over the instructions while at home for a better understanding. Patient also instructed to self quarantine after being tested for COVID-19. The opportunity to ask questions was provided.

## 2022-04-14 NOTE — Anesthesia Preprocedure Evaluation (Signed)
Anesthesia Evaluation  Patient identified by MRN, date of birth, ID band Patient awake    Reviewed: Allergy & Precautions, NPO status , Patient's Chart, lab work & pertinent test results  History of Anesthesia Complications Negative for: history of anesthetic complications  Airway Mallampati: II  TM Distance: >3 FB Neck ROM: Full    Dental no notable dental hx. (+) Dental Advisory Given   Pulmonary neg pulmonary ROS   Pulmonary exam normal        Cardiovascular negative cardio ROS Normal cardiovascular exam     Neuro/Psych  PSYCHIATRIC DISORDERS Anxiety Depression       GI/Hepatic negative GI ROS, Neg liver ROS,,,  Endo/Other  negative endocrine ROS    Renal/GU negative Renal ROS     Musculoskeletal negative musculoskeletal ROS (+)    Abdominal   Peds  Hematology negative hematology ROS (+)   Anesthesia Other Findings   Reproductive/Obstetrics                             Anesthesia Physical Anesthesia Plan  ASA: 2  Anesthesia Plan: General   Post-op Pain Management: Celebrex PO (pre-op)* and Tylenol PO (pre-op)*   Induction: Intravenous  PONV Risk Score and Plan: 4 or greater and Ondansetron, Dexamethasone, Midazolam and Scopolamine patch - Pre-op  Airway Management Planned: Oral ETT  Additional Equipment:   Intra-op Plan:   Post-operative Plan: Extubation in OR  Informed Consent: I have reviewed the patients History and Physical, chart, labs and discussed the procedure including the risks, benefits and alternatives for the proposed anesthesia with the patient or authorized representative who has indicated his/her understanding and acceptance.     Dental advisory given  Plan Discussed with: Anesthesiologist and CRNA  Anesthesia Plan Comments:        Anesthesia Quick Evaluation

## 2022-04-15 ENCOUNTER — Ambulatory Visit (HOSPITAL_COMMUNITY): Payer: BC Managed Care – PPO | Admitting: Certified Registered"

## 2022-04-15 ENCOUNTER — Ambulatory Visit (HOSPITAL_COMMUNITY): Payer: BC Managed Care – PPO

## 2022-04-15 ENCOUNTER — Ambulatory Visit (HOSPITAL_COMMUNITY)
Admission: RE | Admit: 2022-04-15 | Discharge: 2022-04-16 | Disposition: A | Discharge status: Home / Self Care | Payer: BC Managed Care – PPO | Attending: Specialist | Admitting: Specialist

## 2022-04-15 ENCOUNTER — Other Ambulatory Visit: Payer: Self-pay

## 2022-04-15 ENCOUNTER — Encounter (HOSPITAL_COMMUNITY)
Admission: RE | Disposition: A | Discharge status: Home / Self Care | Payer: Self-pay | Source: Home / Self Care | Attending: Specialist

## 2022-04-15 DIAGNOSIS — M5116 Intervertebral disc disorders with radiculopathy, lumbar region: Secondary | ICD-10-CM | POA: Insufficient documentation

## 2022-04-15 DIAGNOSIS — M48061 Spinal stenosis, lumbar region without neurogenic claudication: Secondary | ICD-10-CM

## 2022-04-15 DIAGNOSIS — G9519 Other vascular myelopathies: Secondary | ICD-10-CM | POA: Diagnosis not present

## 2022-04-15 DIAGNOSIS — M5416 Radiculopathy, lumbar region: Secondary | ICD-10-CM

## 2022-04-15 DIAGNOSIS — R2681 Unsteadiness on feet: Secondary | ICD-10-CM | POA: Diagnosis not present

## 2022-04-15 DIAGNOSIS — Z981 Arthrodesis status: Secondary | ICD-10-CM | POA: Diagnosis not present

## 2022-04-15 DIAGNOSIS — M5126 Other intervertebral disc displacement, lumbar region: Secondary | ICD-10-CM | POA: Diagnosis not present

## 2022-04-15 HISTORY — PX: LUMBAR LAMINECTOMY/DECOMPRESSION MICRODISCECTOMY: SHX5026

## 2022-04-15 HISTORY — DX: Spinal stenosis, lumbar region without neurogenic claudication: M48.061

## 2022-04-15 HISTORY — DX: Spinal stenosis, lumbar region without neurogenic claudication: M54.16

## 2022-04-15 SURGERY — LUMBAR LAMINECTOMY/DECOMPRESSION MICRODISCECTOMY 1 LEVEL
Anesthesia: General | Site: Spine Lumbar

## 2022-04-15 MED ORDER — PROPOFOL 10 MG/ML IV BOLUS
INTRAVENOUS | Status: AC
  Filled 2022-04-15: qty 20

## 2022-04-15 MED ORDER — ONDANSETRON HCL 4 MG/2ML IJ SOLN
INTRAMUSCULAR | Status: DC | PRN
  Administered 2022-04-15: 4 mg via INTRAVENOUS

## 2022-04-15 MED ORDER — HYDROCODONE-ACETAMINOPHEN 10-325 MG PO TABS
2.0000 | ORAL_TABLET | ORAL | Status: DC | PRN
  Administered 2022-04-15 – 2022-04-16 (×4): 2 via ORAL
  Filled 2022-04-15 (×4): qty 2

## 2022-04-15 MED ORDER — PROPOFOL 10 MG/ML IV BOLUS
INTRAVENOUS | Status: DC | PRN
  Administered 2022-04-15: 170 mg via INTRAVENOUS

## 2022-04-15 MED ORDER — 0.9 % SODIUM CHLORIDE (POUR BTL) OPTIME
TOPICAL | Status: DC | PRN
  Administered 2022-04-15: 1000 mL

## 2022-04-15 MED ORDER — FENTANYL CITRATE (PF) 100 MCG/2ML IJ SOLN
25.0000 ug | INTRAMUSCULAR | Status: DC | PRN
  Administered 2022-04-15 (×3): 50 ug via INTRAVENOUS

## 2022-04-15 MED ORDER — AMISULPRIDE (ANTIEMETIC) 5 MG/2ML IV SOLN
10.0000 mg | Freq: Once | INTRAVENOUS | Status: AC | PRN
  Administered 2022-04-15: 10 mg via INTRAVENOUS

## 2022-04-15 MED ORDER — KCL IN DEXTROSE-NACL 20-5-0.45 MEQ/L-%-% IV SOLN
INTRAVENOUS | Status: DC
  Filled 2022-04-15: qty 1000

## 2022-04-15 MED ORDER — BUPIVACAINE-EPINEPHRINE (PF) 0.5% -1:200000 IJ SOLN
INTRAMUSCULAR | Status: AC
  Filled 2022-04-15: qty 30

## 2022-04-15 MED ORDER — ACETAMINOPHEN 500 MG PO TABS
1000.0000 mg | ORAL_TABLET | Freq: Once | ORAL | Status: AC
  Administered 2022-04-15: 1000 mg via ORAL
  Filled 2022-04-15: qty 2

## 2022-04-15 MED ORDER — CHLORHEXIDINE GLUCONATE 0.12 % MT SOLN
15.0000 mL | Freq: Once | OROMUCOSAL | Status: AC
  Administered 2022-04-15: 15 mL via OROMUCOSAL

## 2022-04-15 MED ORDER — LIDOCAINE 2% (20 MG/ML) 5 ML SYRINGE
INTRAMUSCULAR | Status: DC | PRN
  Administered 2022-04-15: 100 mg via INTRAVENOUS

## 2022-04-15 MED ORDER — BUSPIRONE HCL 15 MG PO TABS
15.0000 mg | ORAL_TABLET | Freq: Two times a day (BID) | ORAL | Status: DC
  Administered 2022-04-15 – 2022-04-16 (×2): 15 mg via ORAL
  Filled 2022-04-15 (×2): qty 1

## 2022-04-15 MED ORDER — FENTANYL CITRATE (PF) 100 MCG/2ML IJ SOLN
INTRAMUSCULAR | Status: AC
  Filled 2022-04-15: qty 2

## 2022-04-15 MED ORDER — FENTANYL CITRATE (PF) 250 MCG/5ML IJ SOLN
INTRAMUSCULAR | Status: AC
  Filled 2022-04-15: qty 5

## 2022-04-15 MED ORDER — ALUM & MAG HYDROXIDE-SIMETH 200-200-20 MG/5ML PO SUSP: 30.0000 mL | Freq: Four times a day (QID) | ORAL | Status: DC | PRN

## 2022-04-15 MED ORDER — OXYCODONE HCL 5 MG PO TABS
5.0000 mg | ORAL_TABLET | ORAL | Status: DC | PRN
  Administered 2022-04-16 (×2): 5 mg via ORAL
  Filled 2022-04-15 (×2): qty 1

## 2022-04-15 MED ORDER — METHOCARBAMOL 500 MG PO TABS
500.0000 mg | ORAL_TABLET | Freq: Four times a day (QID) | ORAL | Status: DC | PRN
  Administered 2022-04-15 – 2022-04-16 (×4): 500 mg via ORAL
  Filled 2022-04-15 (×5): qty 1

## 2022-04-15 MED ORDER — ROCURONIUM BROMIDE 10 MG/ML (PF) SYRINGE
PREFILLED_SYRINGE | INTRAVENOUS | Status: DC | PRN
  Administered 2022-04-15 (×2): 10 mg via INTRAVENOUS
  Administered 2022-04-15: 80 mg via INTRAVENOUS

## 2022-04-15 MED ORDER — BUPIVACAINE-EPINEPHRINE 0.5% -1:200000 IJ SOLN
INTRAMUSCULAR | Status: DC | PRN
  Administered 2022-04-15: 3 mL

## 2022-04-15 MED ORDER — PHENYLEPHRINE 80 MCG/ML (10ML) SYRINGE FOR IV PUSH (FOR BLOOD PRESSURE SUPPORT)
PREFILLED_SYRINGE | INTRAVENOUS | Status: DC | PRN
  Administered 2022-04-15 (×2): 80 ug via INTRAVENOUS
  Administered 2022-04-15: 160 ug via INTRAVENOUS
  Administered 2022-04-15 (×2): 80 ug via INTRAVENOUS
  Administered 2022-04-15: 40 ug via INTRAVENOUS

## 2022-04-15 MED ORDER — THROMBIN 20000 UNITS EX SOLR: CUTANEOUS | Status: DC | PRN

## 2022-04-15 MED ORDER — CLONAZEPAM 0.5 MG PO TABS
0.5000 mg | ORAL_TABLET | Freq: Three times a day (TID) | ORAL | Status: DC | PRN
  Administered 2022-04-15: 0.5 mg via ORAL
  Filled 2022-04-15 (×2): qty 1

## 2022-04-15 MED ORDER — BUPROPION HCL ER (XL) 300 MG PO TB24
300.0000 mg | ORAL_TABLET | Freq: Every day | ORAL | Status: DC
  Administered 2022-04-16: 300 mg via ORAL
  Filled 2022-04-15: qty 1

## 2022-04-15 MED ORDER — VITAMIN B-12 1000 MCG PO TABS
500.0000 ug | ORAL_TABLET | Freq: Every day | ORAL | Status: DC
  Administered 2022-04-16: 500 ug via ORAL
  Filled 2022-04-15: qty 1

## 2022-04-15 MED ORDER — ROCURONIUM BROMIDE 10 MG/ML (PF) SYRINGE
PREFILLED_SYRINGE | INTRAVENOUS | Status: AC
  Filled 2022-04-15: qty 10

## 2022-04-15 MED ORDER — AMISULPRIDE (ANTIEMETIC) 5 MG/2ML IV SOLN
INTRAVENOUS | Status: AC
  Filled 2022-04-15: qty 4

## 2022-04-15 MED ORDER — ACETAMINOPHEN 325 MG PO TABS: 650.0000 mg | ORAL_TABLET | ORAL | Status: DC | PRN

## 2022-04-15 MED ORDER — EPHEDRINE SULFATE-NACL 50-0.9 MG/10ML-% IV SOSY
PREFILLED_SYRINGE | INTRAVENOUS | Status: DC | PRN
  Administered 2022-04-15 (×2): 10 mg via INTRAVENOUS
  Administered 2022-04-15: 5 mg via INTRAVENOUS

## 2022-04-15 MED ORDER — MIDAZOLAM HCL 2 MG/2ML IJ SOLN
INTRAMUSCULAR | Status: DC | PRN
  Administered 2022-04-15: 2 mg via INTRAVENOUS

## 2022-04-15 MED ORDER — MENTHOL 3 MG MT LOZG: 1.0000 | LOZENGE | OROMUCOSAL | Status: DC | PRN

## 2022-04-15 MED ORDER — LIDOCAINE 2% (20 MG/ML) 5 ML SYRINGE
INTRAMUSCULAR | Status: AC
  Filled 2022-04-15: qty 5

## 2022-04-15 MED ORDER — CHLORHEXIDINE GLUCONATE 0.12 % MT SOLN
OROMUCOSAL | Status: AC
  Filled 2022-04-15: qty 15

## 2022-04-15 MED ORDER — ACETAMINOPHEN 10 MG/ML IV SOLN
1000.0000 mg | INTRAVENOUS | Status: DC
  Filled 2022-04-15: qty 100

## 2022-04-15 MED ORDER — DEXAMETHASONE SODIUM PHOSPHATE 10 MG/ML IJ SOLN
INTRAMUSCULAR | Status: AC
  Filled 2022-04-15: qty 1

## 2022-04-15 MED ORDER — LINACLOTIDE 145 MCG PO CAPS
290.0000 ug | ORAL_CAPSULE | Freq: Every day | ORAL | Status: DC
  Administered 2022-04-16: 290 ug via ORAL
  Filled 2022-04-15 (×2): qty 2

## 2022-04-15 MED ORDER — ORAL CARE MOUTH RINSE: 15.0000 mL | Freq: Once | OROMUCOSAL | Status: AC

## 2022-04-15 MED ORDER — THROMBIN 20000 UNITS EX SOLR
CUTANEOUS | Status: AC
  Filled 2022-04-15: qty 20000

## 2022-04-15 MED ORDER — OXYCODONE HCL 5 MG PO TABS: 5.0000 mg | ORAL_TABLET | ORAL | 0 refills | Status: DC | PRN

## 2022-04-15 MED ORDER — LACTATED RINGERS IV SOLN: INTRAVENOUS | Status: DC

## 2022-04-15 MED ORDER — DOCUSATE SODIUM 100 MG PO CAPS
100.0000 mg | ORAL_CAPSULE | Freq: Two times a day (BID) | ORAL | Status: DC
  Administered 2022-04-15 – 2022-04-16 (×2): 100 mg via ORAL
  Filled 2022-04-15 (×2): qty 1

## 2022-04-15 MED ORDER — POLYETHYLENE GLYCOL 3350 17 G PO PACK: 17.0000 g | PACK | Freq: Every day | ORAL | Status: DC | PRN

## 2022-04-15 MED ORDER — ONDANSETRON HCL 4 MG/2ML IJ SOLN: 4.0000 mg | Freq: Four times a day (QID) | INTRAMUSCULAR | Status: DC | PRN

## 2022-04-15 MED ORDER — BISACODYL 5 MG PO TBEC: 5.0000 mg | DELAYED_RELEASE_TABLET | Freq: Every day | ORAL | Status: DC | PRN

## 2022-04-15 MED ORDER — ONDANSETRON HCL 4 MG PO TABS: 4.0000 mg | ORAL_TABLET | Freq: Four times a day (QID) | ORAL | Status: DC | PRN

## 2022-04-15 MED ORDER — CEFAZOLIN SODIUM-DEXTROSE 2-4 GM/100ML-% IV SOLN
2.0000 g | Freq: Three times a day (TID) | INTRAVENOUS | Status: AC
  Administered 2022-04-15 (×2): 2 g via INTRAVENOUS
  Filled 2022-04-15 (×2): qty 100

## 2022-04-15 MED ORDER — RISAQUAD PO CAPS
1.0000 | ORAL_CAPSULE | Freq: Every day | ORAL | Status: DC
  Administered 2022-04-15 – 2022-04-16 (×2): 1 via ORAL
  Filled 2022-04-15 (×2): qty 1

## 2022-04-15 MED ORDER — GABA 25 MG PO TBDP: 500.0000 mg | ORAL_TABLET | Freq: Every day | ORAL | Status: DC

## 2022-04-15 MED ORDER — ONDANSETRON HCL 4 MG/2ML IJ SOLN
INTRAMUSCULAR | Status: AC
  Filled 2022-04-15: qty 2

## 2022-04-15 MED ORDER — CEFAZOLIN SODIUM-DEXTROSE 2-4 GM/100ML-% IV SOLN
2.0000 g | INTRAVENOUS | Status: AC
  Administered 2022-04-15: 2 g via INTRAVENOUS
  Filled 2022-04-15: qty 100

## 2022-04-15 MED ORDER — MAGNESIUM CITRATE PO SOLN: 1.0000 | Freq: Once | ORAL | Status: DC | PRN

## 2022-04-15 MED ORDER — MIDAZOLAM HCL 2 MG/2ML IJ SOLN
INTRAMUSCULAR | Status: AC
  Filled 2022-04-15: qty 2

## 2022-04-15 MED ORDER — DOCUSATE SODIUM 100 MG PO CAPS: 100.0000 mg | ORAL_CAPSULE | Freq: Two times a day (BID) | ORAL | 1 refills | Status: DC | PRN

## 2022-04-15 MED ORDER — SCOPOLAMINE 1 MG/3DAYS TD PT72
1.0000 | MEDICATED_PATCH | TRANSDERMAL | Status: DC
  Administered 2022-04-15: 1.5 mg via TRANSDERMAL
  Filled 2022-04-15: qty 1

## 2022-04-15 MED ORDER — DEXAMETHASONE SODIUM PHOSPHATE 10 MG/ML IJ SOLN
INTRAMUSCULAR | Status: DC | PRN
  Administered 2022-04-15: 10 mg via INTRAVENOUS

## 2022-04-15 MED ORDER — ACETAMINOPHEN 650 MG RE SUPP: 650.0000 mg | RECTAL | Status: DC | PRN

## 2022-04-15 MED ORDER — PHENOL 1.4 % MT LIQD: 1.0000 | OROMUCOSAL | Status: DC | PRN

## 2022-04-15 MED ORDER — TRANEXAMIC ACID-NACL 1000-0.7 MG/100ML-% IV SOLN
1000.0000 mg | INTRAVENOUS | Status: AC
  Administered 2022-04-15: 1000 mg via INTRAVENOUS
  Filled 2022-04-15: qty 100

## 2022-04-15 MED ORDER — FENTANYL CITRATE (PF) 250 MCG/5ML IJ SOLN
INTRAMUSCULAR | Status: DC | PRN
  Administered 2022-04-15: 50 ug via INTRAVENOUS
  Administered 2022-04-15: 100 ug via INTRAVENOUS
  Administered 2022-04-15: 50 ug via INTRAVENOUS

## 2022-04-15 MED ORDER — POLYETHYLENE GLYCOL 3350 17 G PO PACK: 17.0000 g | PACK | Freq: Every day | ORAL | 0 refills | Status: AC

## 2022-04-15 MED ORDER — PROMETHAZINE HCL 25 MG/ML IJ SOLN: 6.2500 mg | INTRAMUSCULAR | Status: DC | PRN

## 2022-04-15 MED ORDER — SUGAMMADEX SODIUM 200 MG/2ML IV SOLN
INTRAVENOUS | Status: DC | PRN
  Administered 2022-04-15: 100 mg via INTRAVENOUS

## 2022-04-15 MED ORDER — METHOCARBAMOL 1000 MG/10ML IJ SOLN: 500.0000 mg | Freq: Four times a day (QID) | INTRAVENOUS | Status: DC | PRN

## 2022-04-15 MED ORDER — MAGNESIUM GLUCONATE 500 MG PO TABS
250.0000 mg | ORAL_TABLET | Freq: Every day | ORAL | Status: DC
  Filled 2022-04-15 (×2): qty 1

## 2022-04-15 MED ORDER — MELATONIN 3 MG PO TABS
3.0000 mg | ORAL_TABLET | Freq: Every day | ORAL | Status: DC
  Filled 2022-04-15 (×2): qty 1

## 2022-04-15 SURGICAL SUPPLY — 57 items
BAG COUNTER SPONGE SURGICOUNT (BAG) ×1 IMPLANT
BAG DECANTER FOR FLEXI CONT (MISCELLANEOUS) IMPLANT
BAND RUBBER #18 3X1/16 STRL (MISCELLANEOUS) ×2 IMPLANT
BUR EGG ELITE 5.0 (BURR) IMPLANT
BUR RND DIAMOND ELITE 4.0 (BURR) IMPLANT
CLEANER TIP ELECTROSURG 2X2 (MISCELLANEOUS) ×1 IMPLANT
CNTNR URN SCR LID CUP LEK RST (MISCELLANEOUS) ×1 IMPLANT
CONT SPEC 4OZ STRL OR WHT (MISCELLANEOUS) ×1
DRAPE LAPAROTOMY 100X72X124 (DRAPES) ×1 IMPLANT
DRAPE MICROSCOPE SLANT 54X150 (MISCELLANEOUS) ×1 IMPLANT
DRAPE SHEET LG 3/4 BI-LAMINATE (DRAPES) ×1 IMPLANT
DRAPE SURG 17X11 SM STRL (DRAPES) ×1 IMPLANT
DRAPE UTILITY XL STRL (DRAPES) ×1 IMPLANT
DRSG AQUACEL AG ADV 3.5X 4 (GAUZE/BANDAGES/DRESSINGS) IMPLANT
DRSG AQUACEL AG ADV 3.5X 6 (GAUZE/BANDAGES/DRESSINGS) IMPLANT
DRSG TELFA 3X8 NADH STRL (GAUZE/BANDAGES/DRESSINGS) IMPLANT
DURAPREP 26ML APPLICATOR (WOUND CARE) ×1 IMPLANT
DURASEAL SPINE SEALANT 3ML (MISCELLANEOUS) IMPLANT
ELECT BLADE 4.0 EZ CLEAN MEGAD (MISCELLANEOUS)
ELECT REM PT RETURN 9FT ADLT (ELECTROSURGICAL) ×1
ELECTRODE BLDE 4.0 EZ CLN MEGD (MISCELLANEOUS) IMPLANT
ELECTRODE REM PT RTRN 9FT ADLT (ELECTROSURGICAL) ×1 IMPLANT
GLOVE BIOGEL PI IND STRL 7.5 (GLOVE) ×1 IMPLANT
GLOVE SURG SS PI 7.0 STRL IVOR (GLOVE) ×1 IMPLANT
GLOVE SURG SS PI 8.0 STRL IVOR (GLOVE) ×2 IMPLANT
GOWN STRL REUS W/ TWL LRG LVL3 (GOWN DISPOSABLE) ×1 IMPLANT
GOWN STRL REUS W/ TWL XL LVL3 (GOWN DISPOSABLE) ×1 IMPLANT
GOWN STRL REUS W/TWL LRG LVL3 (GOWN DISPOSABLE) ×1
GOWN STRL REUS W/TWL XL LVL3 (GOWN DISPOSABLE) ×1
IV CATH 14GX2 1/4 (CATHETERS) ×1 IMPLANT
KIT BASIN OR (CUSTOM PROCEDURE TRAY) ×1 IMPLANT
NDL 22X1.5 STRL (OR ONLY) (MISCELLANEOUS) ×1 IMPLANT
NDL SPNL 18GX3.5 QUINCKE PK (NEEDLE) ×2 IMPLANT
NEEDLE 22X1.5 STRL (OR ONLY) (MISCELLANEOUS) ×1 IMPLANT
NEEDLE SPNL 18GX3.5 QUINCKE PK (NEEDLE) ×2 IMPLANT
PACK LAMINECTOMY NEURO (CUSTOM PROCEDURE TRAY) ×1 IMPLANT
PATTIES SURGICAL .25X.25 (GAUZE/BANDAGES/DRESSINGS) IMPLANT
PATTIES SURGICAL .75X.75 (GAUZE/BANDAGES/DRESSINGS) ×1 IMPLANT
SOLUTION PRONTOSAN WOUND 350ML (IRRIGATION / IRRIGATOR) IMPLANT
SPONGE SURGIFOAM ABS GEL 100 (HEMOSTASIS) ×1 IMPLANT
SPONGE T-LAP 4X18 ~~LOC~~+RFID (SPONGE) IMPLANT
STAPLER VISISTAT (STAPLE) IMPLANT
STRIP CLOSURE SKIN 1/2X4 (GAUZE/BANDAGES/DRESSINGS) ×1 IMPLANT
SUT NURALON 4 0 TR CR/8 (SUTURE) IMPLANT
SUT PROLENE 3 0 PS 2 (SUTURE) IMPLANT
SUT VIC AB 1 CT1 27 (SUTURE)
SUT VIC AB 1 CT1 27XBRD ANTBC (SUTURE) IMPLANT
SUT VIC AB 1-0 CT2 27 (SUTURE) IMPLANT
SUT VIC AB 2-0 CT1 27 (SUTURE)
SUT VIC AB 2-0 CT1 TAPERPNT 27 (SUTURE) IMPLANT
SUT VIC AB 2-0 CT2 27 (SUTURE) IMPLANT
SYR 3ML LL SCALE MARK (SYRINGE) ×1 IMPLANT
TOWEL GREEN STERILE (TOWEL DISPOSABLE) ×1 IMPLANT
TOWEL GREEN STERILE FF (TOWEL DISPOSABLE) ×1 IMPLANT
TRAY FOLEY MTR SLVR 16FR STAT (SET/KITS/TRAYS/PACK) ×1 IMPLANT
WIPE CHG 2% 2PK PREOPERATIVE (MISCELLANEOUS) ×1 IMPLANT
YANKAUER SUCT BULB TIP NO VENT (SUCTIONS) ×1 IMPLANT

## 2022-04-15 NOTE — Op Note (Signed)
Shannon [name], QAZI MEDICAL RECORD NO: [id number] ACCOUNT NO: [id number] DATE OF BIRTH: 04/20/1961 FACILITY: [facility] LOCATION: [facility]-PERIOP PHYSICIAN: Johnn Hai, MD  Operative Report   DATE OF PROCEDURE: 04/15/2022  PREOPERATIVE DIAGNOSIS:  Spinal stenosis, HNP, L4-L5.  POSTOPERATIVE DIAGNOSES:  Spinal stenosis, HNP, L4-L5.  PROCEDURE PERFORMED:   1.  Lumbar decompression L3-L4 with bilateral lateral recess decompression and foraminotomy, L3-L4 left with microdiskectomy L3-L4, left. 2. Lysis of extensive epidural venous plexus L3-L4 left, tethering the L3 and L4 nerve roots.  ANESTHESIA:  General.  ASSISTANT:  Lacie Draft, PA.  HISTORY:  A 61 year old female with left lower extremity radicular pain L4 nerve root distribution, secondary to stenosis and disk herniation L3-L4, left with severe lateral recess stenosis.  She was indicated for decompression of the L3 and L4 nerve  roots predominantly on the left.  She began by having right sided symptoms.  We discussed the decompression and evaluation of the lateral recesses.  Risks and benefits discussed including bleeding, infection, damage to neurovascular structures, no change  in symptoms, worsening symptoms, DVT, PE, anesthetic complications, etc.  DESCRIPTION OF PROCEDURE:  With the patient in supine position, after induction of adequate general anesthesia, 2 grams Kefzol placed prone on the Wilson frame.  All bony prominences well padded.  Lumbar region was prepped and draped in the usual sterile  fashion.  Two 18-gauge spinal needles utilized to localize the L3-L4 interspace, confirmed with x-ray.  Incision was made from the spinous process of 3 to below 4.  Subcutaneous tissue was dissected.  Electrocautery was utilized to achieve hemostasis.   Dorsal lumbar fascia divided in line with skin incision.  Paraspinous muscle elevated from lamina L3-L4. Operating microscope was draped and brought in the surgical field after  confirmatory radiograph obtained with Kochers on the spinous processes of 3  and 4.  A small interlaminar window was noted at 3-4.  The patient has underlying scoliosis and slight rotation.  I felt this precluded a unilateral decompression I therefore proceeded with a central decompression.  I removed the interspinous ligament,  removed partial spinous processes of 3 and of 4.  I used Leksell rongeur to remove a portion of the hemilamina of 3, preserving the pars.  Straight curette utilized detached ligamentum flavum from the cephalad edge of 4.  I then from the midline entered  the epidural space with a Penfield and then Oxford to develop a plane between ligamentum flavum and the epidural space.  I began removing ligamentum flavum from the interspace.  I skeletonized the superior articulating process of L4 on the left.  I  placed a neuro patty beneath the thecal sac and decompressing the lateral recess it was severely stenotic on the left.  I protected the neural elements and performed a generous foraminotomy of L4.  I continued cephalad.  There was still compression of  the thecal sac in the lateral recess.  Undercut the facets to the medial border of the pedicle.  There was an extensive epidural venous plexus noted tethering the L4 and L3 roots. I meticulously used bipolar cautery and a nerve hook to cauterize and  divide the plexus to mobilize the nerve roots.  There was severe tethering was noted.  After this, then I undercut the foramen of 3, preserving the pars.  A focal HNP was noted and I protected the 3 root of the thecal sac and the 4 root.  The disk  herniation was noted.  I placed a patty cephalad and caudad.  I performed an annulotomy.  Copious portion of disk material was removed from the disk space with a micropituitary. Further mobilized with Epstein and catheter lavage with additional multiple  fragments excised.  This decompressed the lateral recess satisfactorily.  Confirmatory  radiograph obtained with a Penfield in the disk space at L3-L4.  Following this, I irrigated again.  Additional fragments were excised.  All herniated material was  excised.  I then checked beneath thecal sac the foramen of 3 and 4 in the axilla and in the shoulder of the root no residual disk herniation noted.  There was good mobilization of the 4 root medial to pedicle approximately a cm without tension.   Indentation of the lateral aspect of the thecal sac was noted as a result of the severe lateral recess stenosis.  Woodson probe passed freely out the foramen at 3 and 4 in cephalad above 3 and below L4.  I placed thrombin-soaked Gelfoam in the wound and  then removed it.  Copiously irrigated.  No evidence of CSF leakage or active bleeding.  Bone wax was placed on the cancellous surfaces.  I then removed the [facility] retractor, irrigated it copiously the paraspinous musculature.  I evacuated the  irrigant.  Prior to that, we had removed ligamentum flavum from the interspace on the right L3-L4 as well.  It was not nearly as stenotic as on the left.  Closed the dorsal lumbar fascia with #1 Vicryl in interrupted figure-of-eight sutures, subcutaneous  with 2-0 and skin with subcuticular Prolene.  Sterile dressing applied.  Placed supine on the hospital bed, extubated without difficulty and transported to the recovery room in satisfactory condition.  The patient tolerated the procedure well.  No complications.  ASSISTANT:  Lacie Draft, PA.  BLOOD LOSS:  50 mL.   PUS D: 04/15/2022 10:15:10 am T: 04/15/2022 11:04:00 am  JOB: [id number]/ [id number]

## 2022-04-15 NOTE — Transfer of Care (Signed)
Immediate Anesthesia Transfer of Care Note  Patient: Shannon [name]  Procedure(s) Performed: LUMBAR THREE-FOUR LUMBAR LAMINECTOMY (Spine Lumbar)  Patient Location: PACU  Anesthesia Type:General  Level of Consciousness: awake, alert , and oriented  Airway & Oxygen Therapy: Patient Spontanous Breathing and Patient connected to nasal cannula oxygen  Post-op Assessment: Report given to RN, Post -op Vital signs reviewed and stable, and Patient moving all extremities  Post vital signs: Reviewed and stable  Last Vitals:  Vitals Value Taken Time  BP 114/68 04/15/22 1030  Temp 36.2 C 04/15/22 1025  Pulse 73 04/15/22 1031  Resp 22 04/15/22 1031  SpO2 97 % 04/15/22 1031  Vitals shown include unvalidated device data.  Last Pain:  Vitals:   04/15/22 0649  TempSrc:   PainSc: 9       Patients Stated Pain Goal: 1 (03/03/27 1188)  Complications: No notable events documented.

## 2022-04-15 NOTE — Anesthesia Procedure Notes (Signed)
Procedure Name: Intubation Date/Time: 04/15/2022 7:41 AM  Performed by: Leonor Liv, CRNAPre-anesthesia Checklist: Patient identified, Emergency Drugs available, Suction available and Patient being monitored Patient Re-evaluated:Patient Re-evaluated prior to induction Oxygen Delivery Method: Circle System Utilized Preoxygenation: Pre-oxygenation with 100% oxygen Induction Type: IV induction Ventilation: Mask ventilation without difficulty Laryngoscope Size: Mac and 3 Grade View: Grade II Tube type: Oral Tube size: 7.0 mm Number of attempts: 1 Airway Equipment and Method: Stylet and Oral airway Placement Confirmation: ETT inserted through vocal cords under direct vision, positive ETCO2 and breath sounds checked- equal and bilateral Secured at: 21 cm Tube secured with: Tape Dental Injury: Teeth and Oropharynx as per pre-operative assessment

## 2022-04-15 NOTE — Brief Op Note (Signed)
04/15/2022  7:24 AM  PATIENT:  Shannon [name]  61 y.o. female  PRE-OPERATIVE DIAGNOSIS:  Stenosis L3-4  POST-OPERATIVE DIAGNOSIS:  * No post-op diagnosis entered *  PROCEDURE:  Procedure(s) with comments: LUMBAR LAMINECTOMY L3-4 (N/A) - 2 hrs 3 C-Bed  SURGEON:  Surgeon(s) and Role:    Susa Day, MD - Primary  PHYSICIAN ASSISTANT:   ASSISTANTS: Bissell   ANESTHESIA:   general  EBL:  25   BLOOD ADMINISTERED:none  DRAINS: none   LOCAL MEDICATIONS USED:  MARCAINE     SPECIMEN:  No Specimen  DISPOSITION OF SPECIMEN:  N/A  COUNTS:  YES  TOURNIQUET:  * No tourniquets in log *  DICTATION: .Other Dictation: Dictation Number [id number]  PLAN OF CARE: Admit for overnight observation  PATIENT DISPOSITION:  PACU - hemodynamically stable.   Delay start of Pharmacological VTE agent (>24hrs) due to surgical blood loss or risk of bleeding: yes

## 2022-04-15 NOTE — Anesthesia Postprocedure Evaluation (Signed)
Anesthesia Post Note  Patient: Shannon [name]  Procedure(s) Performed: LUMBAR THREE-FOUR LUMBAR LAMINECTOMY (Spine Lumbar)     Patient location during evaluation: PACU Anesthesia Type: General Level of consciousness: sedated Pain management: pain level controlled Vital Signs Assessment: post-procedure vital signs reviewed and stable Respiratory status: spontaneous breathing and respiratory function stable Cardiovascular status: stable Postop Assessment: no apparent nausea or vomiting Anesthetic complications: no  No notable events documented.  Last Vitals:  Vitals:   04/15/22 1155 04/15/22 1200  BP: 106/73 108/73  Pulse: 63 70  Resp: 17 16  Temp:  36.4 C  SpO2: 99% 98%    Last Pain:  Vitals:   04/15/22 1200  TempSrc:   PainSc: 3                  Kamden Reber DANIEL

## 2022-04-15 NOTE — Discharge Instructions (Signed)

## 2022-04-15 NOTE — Interval H&P Note (Signed)
History and Physical Interval Note:  04/15/2022 7:23 AM  Shannon [name]  has presented today for surgery, with the diagnosis of Stenosis L3-4.  The various methods of treatment have been discussed with the patient and family. After consideration of risks, benefits and other options for treatment, the patient has consented to  Procedure(s) with comments: LUMBAR LAMINECTOMY L3-4 (N/A) - 2 hrs 3 C-Bed as a surgical intervention.  The patient's history has been reviewed, patient examined, no change in status, stable for surgery.  I have reviewed the patient's chart and labs.  Questions were answered to the patient's satisfaction.     Johnn Hai

## 2022-04-16 ENCOUNTER — Encounter (HOSPITAL_COMMUNITY): Payer: Self-pay | Admitting: Specialist

## 2022-04-16 DIAGNOSIS — R2681 Unsteadiness on feet: Secondary | ICD-10-CM | POA: Diagnosis not present

## 2022-04-16 DIAGNOSIS — M48061 Spinal stenosis, lumbar region without neurogenic claudication: Secondary | ICD-10-CM | POA: Diagnosis not present

## 2022-04-16 DIAGNOSIS — M5116 Intervertebral disc disorders with radiculopathy, lumbar region: Secondary | ICD-10-CM | POA: Diagnosis not present

## 2022-04-16 NOTE — Evaluation (Signed)
Occupational Therapy Evaluation Patient Details Name: Shannon [name] MRN: [id number] DOB: 05-09-1960 Today's Date: 04/16/2022   History of Present Illness 61 y.o. female admitted 12/14 underwent LUMBAR THREE-FOUR LUMBAR LAMINECTOMY.   Clinical Impression   Patient admitted for the procedure above.  PTA she lives at home with her spouse, who can assist as needed.  The patient remains active and needed no assist with ADL or iADL.  Currently she has expected post op discomfort, but is performing her own ADL from a sit to stand level, and moving about her room without assist.  All precautions reviewed during a functional task, and all questions answered.  No further OT needs in the acute setting.        Recommendations for follow up therapy are one component of a multi-disciplinary discharge planning process, led by the attending physician.  Recommendations may be updated based on patient status, additional functional criteria and insurance authorization.   Follow Up Recommendations  No OT follow up     Assistance Recommended at Discharge Set up Supervision/Assistance  Patient can return home with the following Assist for transportation    Functional Status Assessment  Patient has not had a recent decline in their functional status  Equipment Recommendations  None recommended by OT    Recommendations for Other Services       Precautions / Restrictions Precautions Precautions: Back Precaution Booklet Issued: Yes (comment) Precaution Comments: Reviewed handout, provided Restrictions Weight Bearing Restrictions: No      Mobility Bed Mobility Overal bed mobility: Modified Independent                  Transfers Overall transfer level: Modified independent                        Balance Overall balance assessment: Mild deficits observed, not formally tested                                         ADL either performed or assessed with  clinical judgement   ADL Overall ADL's : At baseline                                             Vision Patient Visual Report: No change from baseline       Perception     Praxis      Pertinent Vitals/Pain Pain Assessment Pain Assessment: Faces Faces Pain Scale: Hurts a little bit Pain Location: back Pain Descriptors / Indicators: Grimacing, Guarding Pain Intervention(s): Monitored during session     Hand Dominance Right   Extremity/Trunk Assessment Upper Extremity Assessment Upper Extremity Assessment: Overall WFL for tasks assessed   Lower Extremity Assessment Lower Extremity Assessment: Defer to PT evaluation   Cervical / Trunk Assessment Cervical / Trunk Assessment: Back Surgery   Communication Communication Communication: No difficulties   Cognition Arousal/Alertness: Awake/alert Behavior During Therapy: WFL for tasks assessed/performed Overall Cognitive Status: Within Functional Limits for tasks assessed                                       General Comments   VSS on RA    Exercises  Shoulder Instructions      Home Living Family/patient expects to be discharged to:: Private residence Living Arrangements: Spouse/significant other Available Help at Discharge: Family;Available 24 hours/day Type of Home: House Home Access: Stairs to enter [employer] of Steps: 5 Entrance Stairs-Rails: Left;Right Home Layout: Two level;Bed/bath upstairs Alternate Level Stairs-Number of Steps: 13 Alternate Level Stairs-Rails: Left Bathroom Shower/Tub: Tub/shower unit;Walk-in shower   Bathroom Toilet: Standard Bathroom Accessibility: Yes How Accessible: Accessible via walker Home Equipment: [place] (2 wheels);Cane - single point;BSC/3in1;Shower seat - built in          Prior Functioning/Environment Prior Level of Function : Independent/Modified Independent             Mobility Comments: Previously  very active, Academic librarian, teaches swim lessons. ADLs Comments: Ind        OT Problem List: Pain      OT Treatment/Interventions:      OT Goals(Current goals can be found in the care plan section) Acute Rehab OT Goals Patient Stated Goal: Return home OT Goal Formulation: With patient Time For Goal Achievement: 04/19/22 Potential to Achieve Goals: Good  OT Frequency:      Co-evaluation              AM-PAC OT "6 Clicks" Daily Activity     Outcome Measure Help from another person eating meals?: None Help from another person taking care of personal grooming?: None Help from another person toileting, which includes using toliet, bedpan, or urinal?: None Help from another person bathing (including washing, rinsing, drying)?: None Help from another person to put on and taking off regular upper body clothing?: None Help from another person to put on and taking off regular lower body clothing?: None 6 Click Score: 24   End of Session Nurse Communication: Mobility status  Activity Tolerance: Patient tolerated treatment well Patient left: in bed;with call bell/phone within reach  OT Visit Diagnosis: Unsteadiness on feet (R26.81)                Time: 4650-3546 OT Time Calculation (min): 21 min Charges:  OT General Charges $OT Visit: 1 Visit OT Evaluation $OT Eval Moderate Complexity: 1 Mod  04/16/2022  RP, OTR/L  Acute Rehabilitation Services  Office:  [phone]   Metta Clines 04/16/2022, 9:51 AM

## 2022-04-16 NOTE — Progress Notes (Addendum)
Subjective: 1 Day Post-Op Procedure(s) (LRB): LUMBAR THREE-FOUR LUMBAR LAMINECTOMY (N/A) Patient reports pain as moderate.   Leg symptoms improved. C/o back pain. Voiding without difficulty  Objective: Vital signs in last 24 hours: Temp:  [97.2 F (36.2 C)-98.5 F (36.9 C)] 98.1 F (36.7 C) (12/15 0308) Pulse Rate:  [57-80] 65 (12/15 0308) Resp:  [11-20] 18 (12/15 0308) BP: (91-130)/(53-74) 97/55 (12/15 0308) SpO2:  [97 %-100 %] 99 % (12/15 0308)  Intake/Output from previous day: 12/14 0701 - 12/15 0700 In: 2560 [P.O.:960; I.V.:1600] Out: 850 [Urine:750; Blood:100] Intake/Output this shift: No intake/output data recorded.  No results for input(s): "HGB" in the last 72 hours. No results for input(s): "WBC", "RBC", "HCT", "PLT" in the last 72 hours. No results for input(s): "NA", "K", "CL", "CO2", "BUN", "CREATININE", "GLUCOSE", "CALCIUM" in the last 72 hours. No results for input(s): "LABPT", "INR" in the last 72 hours.  Neurologically intact ABD soft Neurovascular intact Sensation intact distally Intact pulses distally Dorsiflexion/Plantar flexion intact Incision: dressing C/D/I and no drainage No cellulitis present Compartment soft No sign of DVT   Assessment/Plan: 1 Day Post-Op Procedure(s) (LRB): LUMBAR THREE-FOUR LUMBAR LAMINECTOMY (N/A) Advance diet Up with therapy D/C IV fluids Taking Norco - can try Oxy D/C home today Discussed D/C instructions, Lspine precautions, dressing instructions   Cecilie Kicks 04/16/2022, 7:57 AM

## 2022-04-16 NOTE — Plan of Care (Signed)
  Problem: Education: Goal: Ability to verbalize activity precautions or restrictions will improve Outcome: Completed/Met Goal: Knowledge of the prescribed therapeutic regimen will improve Outcome: Completed/Met Goal: Understanding of discharge needs will improve Outcome: Completed/Met  Patient alert and oriented, VSS, Void, Ambulate. Surgical clean and dry no sign of infection. D/c instructions explain and given to the patient all questions answered. Patient d/c home per order.

## 2022-04-16 NOTE — Evaluation (Signed)
Physical Therapy Evaluation and Discharge Patient Details Name: Shannon [name] MRN: [id number] DOB: November 24, 1960 Today's Date: 04/16/2022  History of Present Illness  61 y.o. female admitted 12/14 underwent LUMBAR THREE-FOUR LUMBAR LAMINECTOMY.  Clinical Impression  Patient evaluated by Physical Therapy with no further acute PT needs identified. All education has been completed and the patient has no further questions. Safely completed stair training similar to home environment (main bed/bath upstairs.) Educated on precautions, able to verbalize understanding (handout provided.) No physical assist with activities performed today. Ambulating >200 feet with and without assistive device. Agreeable to RW use as needed at home (already has this device.) See below for any follow-up Physical Therapy or equipment needs. PT is signing off. Thank you for this referral.        Recommendations for follow up therapy are one component of a multi-disciplinary discharge planning process, led by the attending physician.  Recommendations may be updated based on patient status, additional functional criteria and insurance authorization.  Follow Up Recommendations No PT follow up      Assistance Recommended at Discharge Set up Supervision/Assistance  Patient can return home with the following  Assistance with cooking/housework;Assist for transportation;Help with stairs or ramp for entrance    Equipment Recommendations None recommended by PT  Recommendations for Other Services       Functional Status Assessment Patient has had a recent decline in their functional status and demonstrates the ability to make significant improvements in function in a reasonable and predictable amount of time.     Precautions / Restrictions Precautions Precautions: Back Precaution Booklet Issued: Yes (comment) Precaution Comments: Reviewed handout, provided      Mobility  Bed Mobility Overal bed mobility: Needs  Assistance Bed Mobility: Rolling, Sidelying to Sit, Sit to Sidelying Rolling: Supervision Sidelying to sit: Supervision     Sit to sidelying: Supervision General bed mobility comments: Reviewed log roll technique for getting into and out of bed. Minor cues for sequencing but no physical assist required.    Transfers Overall transfer level: Needs assistance Equipment used: Rolling walker (2 wheels), None Transfers: Sit to/from Stand Sit to Stand: Supervision           General transfer comment: Performed from various surfaces with and without RW for support. Cues for hand technique, and hip hinge to maintain back precautions. No assist needed.    Ambulation/Gait Ambulation/Gait assistance: Supervision Gait Distance (Feet): 215 Feet Assistive device: Rolling walker (2 wheels), None Gait Pattern/deviations: Step-through pattern, Narrow base of support, Drifts right/left Gait velocity: decreased Gait velocity interpretation: <1.8 ft/sec, indicate of risk for recurrent falls   General Gait Details: Educated on AD use with RW, shows good control, no loss of balance, 100 feet, slower gait speed no evidence of LE buckling. 115 feet without AD, minor drift intermittently noted, able to self correct with supervision for safety, cues for awareness.  Stairs Stairs: Yes Stairs assistance: Supervision Stair Management: One rail Left, Forwards, Step to pattern Number of Stairs: 13 General stair comments: Educated on safe stair navigation with various techniques demonstrated to pt. Safely completed with cues for seqeuncing and rail use similar to home set-up.  Wheelchair Mobility    Modified Rankin (Stroke Patients Only)       Balance Overall balance assessment: Mild deficits observed, not formally tested  Pertinent Vitals/Pain Pain Assessment Pain Assessment: Faces Faces Pain Scale: Hurts little more Pain Location:  back Pain Descriptors / Indicators: Aching, Operative site guarding Pain Intervention(s): Monitored during session, Repositioned, Limited activity within patient's tolerance    Home Living Family/patient expects to be discharged to:: Private residence Living Arrangements: Spouse/significant other Available Help at Discharge: Family;Available 24 hours/day Type of Home: House Home Access: Stairs to enter Entrance Stairs-Rails: Chemical engineer of Steps: 5 Alternate Level Stairs-Number of Steps: 13 Home Layout: Two level;Bed/bath upstairs Home Equipment: Conservation officer, nature (2 wheels);Cane - single point;BSC/3in1      Prior Function Prior Level of Function : Independent/Modified Independent             Mobility Comments: Previously very active, swimmer, teaches swim lessons.       Hand Dominance        Extremity/Trunk Assessment   Upper Extremity Assessment Upper Extremity Assessment: Defer to OT evaluation    Lower Extremity Assessment Lower Extremity Assessment: Generalized weakness       Communication   Communication: No difficulties  Cognition Arousal/Alertness: Awake/alert Behavior During Therapy: WFL for tasks assessed/performed Overall Cognitive Status: Within Functional Limits for tasks assessed                                          General Comments      Exercises     Assessment/Plan    PT Assessment Patient does not need any further PT services  PT Problem List         PT Treatment Interventions      PT Goals (Current goals can be found in the Care Plan section)  Acute Rehab PT Goals Patient Stated Goal: Return to swimming PT Goal Formulation: All assessment and education complete, DC therapy    Frequency       Co-evaluation               AM-PAC PT "6 Clicks" Mobility  Outcome Measure Help needed turning from your back to your side while in a flat bed without using bedrails?: A Little Help  needed moving from lying on your back to sitting on the side of a flat bed without using bedrails?: A Little Help needed moving to and from a bed to a chair (including a wheelchair)?: A Little Help needed standing up from a chair using your arms (e.g., wheelchair or bedside chair)?: None Help needed to walk in hospital room?: None Help needed climbing 3-5 steps with a railing? : None 6 Click Score: 21    End of Session Equipment Utilized During Treatment: Gait belt Activity Tolerance: Patient tolerated treatment well Patient left: in bed;with call bell/phone within reach Nurse Communication: Mobility status PT Visit Diagnosis: Unsteadiness on feet (R26.81);Other abnormalities of gait and mobility (R26.89);Muscle weakness (generalized) (M62.81);Pain Pain - part of body:  (back)    Time: 8110-3159 PT Time Calculation (min) (ACUTE ONLY): 26 min   Charges:   PT Evaluation $PT Eval Low Complexity: 1 Low PT Treatments $Gait Training: 8-22 mins        Candie Mile, PT, DPT Physical Therapist Acute Rehabilitation Services [place]   Ellouise Newer 04/16/2022, 9:25 AM

## 2022-04-19 ENCOUNTER — Ambulatory Visit: Payer: BC Managed Care – PPO | Admitting: Behavioral Health

## 2022-04-28 ENCOUNTER — Other Ambulatory Visit: Payer: Self-pay | Admitting: Family

## 2022-04-29 NOTE — Telephone Encounter (Signed)
Shannon [name]  Requesting: clonazepam 0.'5mg'$   Contract:08/29/19 UDS:12/29/20 Last Visit: 02/03/22 Next Visit: None Last Refill: 04/01/22 #90 and 0RF  Please Advise

## 2022-05-05 ENCOUNTER — Ambulatory Visit: Payer: BC Managed Care – PPO | Admitting: Behavioral Health

## 2022-05-05 NOTE — Progress Notes (Unsigned)
                Ana Woodroof L Tenise Stetler, LMFT 

## 2022-05-18 ENCOUNTER — Telehealth (HOSPITAL_COMMUNITY): Payer: Self-pay | Admitting: Psychiatry

## 2022-05-18 ENCOUNTER — Ambulatory Visit (INDEPENDENT_AMBULATORY_CARE_PROVIDER_SITE_OTHER): Payer: BC Managed Care – PPO | Admitting: Professional

## 2022-05-18 ENCOUNTER — Encounter: Payer: Self-pay | Admitting: Professional

## 2022-05-18 DIAGNOSIS — F331 Major depressive disorder, recurrent, moderate: Secondary | ICD-10-CM | POA: Diagnosis not present

## 2022-05-18 DIAGNOSIS — F411 Generalized anxiety disorder: Secondary | ICD-10-CM

## 2022-05-18 NOTE — Telephone Encounter (Signed)
D:  Shannon [name],(therapist) referred pt to [facility].  A:  Placed call to orient pt.  Pt inquired about in person groups.  Informed pt that [facility] is virtual only; but there are two other places cm knew of that had in person options ([facility] and [facility]).  Pt states that her sister is coming to town and she would be unavailable for three weeks.  "I think I would prefer to do group the end of February, if I decide to do virtual group.  I just know that I do better in person instead of virtual."  Pt states she will discuss with Juliann Pulse.  Encouraged pt to call the cm back if she changes her mind.  Inform Juliann Pulse.  R:  Pt receptive.

## 2022-05-18 NOTE — Progress Notes (Signed)
                Eleanor Dimichele, LCMHC °
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
## 2022-05-24 ENCOUNTER — Ambulatory Visit (INDEPENDENT_AMBULATORY_CARE_PROVIDER_SITE_OTHER): Payer: BC Managed Care – PPO | Admitting: Professional

## 2022-05-24 ENCOUNTER — Encounter: Payer: Self-pay | Admitting: Professional

## 2022-05-24 DIAGNOSIS — F331 Major depressive disorder, recurrent, moderate: Secondary | ICD-10-CM

## 2022-05-24 DIAGNOSIS — F411 Generalized anxiety disorder: Secondary | ICD-10-CM | POA: Diagnosis not present

## 2022-05-24 NOTE — Progress Notes (Signed)
[place] Counselor/Therapist Progress Note  Patient ID: Shannon [name], MRN: [id number],    Date: 05/24/2022  Time Spent: 58 minutes 1001-1059 am   Treatment Type: Individual Therapy  Risk Assessment: Danger to Self:  No Self-injurious Behavior: No Danger to Others: No  Subjective: This session was held via video teletherapy. The patient consented to video teletherapy and was located at her home during this session. She is aware it is the responsibility of the patient to secure confidentiality on her end of the session. The provider was in a private home office for the duration of this session.    The patient arrived on time for her webex session.   Issues addressed: 1-treatment planning -patient and Clinician completed development of patient's treatment plan -pt fully participated and is in full agreement with her plan.  Treatment Plan Problems Addressed  Anxiety, Childhood Trauma, Low Self-Esteem, Unipolar Depression Goals 1. Alleviate depressive symptoms and return to previous level of effective functioning. 2. Appropriately grieve the loss in order to normalize mood and to return to previously adaptive level of functioning. Objective Learn and implement behavioral strategies to overcome depression. Target Date: 2023-05-24 Frequency: Biweekly  Progress: 0 Modality: individual  Related Interventions Engage the client in "behavioral activation," increasing his/her activity level and contact with sources of reward, while identifying processes that inhibit activation (see Behavioral Activation for Depression by Beverly Gust, Dimidjian, and Herman-Dunn; or assign "Identify and Schedule Pleasant Activities" in the Adult Psychotherapy Homework Planner by [facility]); use behavioral techniques such as instruction, rehearsal, role-playing, role reversal, as needed, to facilitate activity in the client's daily life; reinforce success. Assist the client in developing skills  that increase the likelihood of deriving pleasure from behavioral activation (e.g., assertiveness skills, developing an exercise plan, less internal/more external focus, increased social involvement); reinforce success. Objective Learn and implement conflict resolution skills to resolve interpersonal problems. Target Date: 2023-05-24 Frequency: Biweekly  Progress: 0 Modality: individual  Related Interventions Teach conflict resolution skills (e.g., empathy, active listening, "I messages," respectful communication, assertiveness without aggression, compromise); use psychoeducation, modeling, role-playing, and rehearsal to work through several current conflicts; assign homework exercises; review and repeat so as to integrate their use into the client's life. Objective Identify important people in life, past and present, and describe the quality, good and poor, of those relationships. Target Date: 2023-05-24 Frequency: Biweekly  Progress: 0 Modality: individual  Objective Learn and implement relapse prevention skills. Target Date: 2023-05-24 Frequency: Biweekly  Progress: 0 Modality: individual  Related Interventions Identify and rehearse with the client the management of future situations or circumstances in which lapses could occur. Objective Verbalize an understanding of healthy and unhealthy emotions with the intent of increasing the use of healthy emotions to guide actions. Target Date: 2023-05-24 Frequency: Biweekly  Progress: 0 Modality: individual  Related Interventions Use a process-experiential approach consistent with Emotion-Focused Therapy to create a safe, nurturing environment in which the client can process emotions, learning to identify and regulate unhealthy feelings and to generate more adaptive ones that then guide actions (see Emotion-Focused Therapy for Depression by Andrey Spearman). 3. Demonstrate improved self-esteem through more pride in appearance, more  assertiveness, greater eye contact, and identification of positive traits in self-talk messages. 4. Develop an awareness of how childhood issues have affected and continue to affect one's family life. 5. Develop healthy interpersonal relationships that lead to the alleviation and help prevent the relapse of depression. 6. Develop healthy thinking patterns and beliefs about self, others, and the world that lead  to the alleviation and help prevent the relapse of depression. 7. Elevate self-esteem. Objective Demonstrate an increased ability to identify and express personal feelings. Target Date: 2023-05-24 Frequency: Biweekly  Progress: 0 Modality: individual  Related Interventions Assist the client in identifying and labeling emotions. Objective Acknowledge feeling less competent than most others. Target Date: 2023-05-24 Frequency: Biweekly  Progress: 0 Modality: individual  Related Interventions Explore the client's assessment of himself/herself and what is verbalized as the basis for negative self-perception. Actively build the level of trust with the client in individual sessions through consistent eye contact, active listening, unconditional positive regard, and warm acceptance to help increase his/her ability to identify and express feelings. Objective Decrease the frequency of negative self-descriptive statements and increase frequency of positive self-descriptive statements. Target Date: 2023-05-24 Frequency: Biweekly  Progress: 0 Modality: individual  Related Interventions Assist the client in becoming aware of how he/she expresses or acts out negative feelings about himself/herself. Help the client reframe his/her negative assessment of himself/herself. Assist the client in developing positive self-talk as a way of boosting his/her confidence and self-image (or assign "Positive Self-Talk" in the Adult Psychotherapy Homework Planner by Bryn Gulling). Objective Identify and engage in  activities that would improve self-image by being consistent with one's values. Target Date: 2023-05-24 Frequency: Biweekly  Progress: 0 Modality: individual  Related Interventions Help the client analyze his/her values and the congruence or incongruence between them and the client's daily activities. Identify and assign activities congruent with the client's values; process them toward improving self-concept and self-esteem. Objective Identify positive traits and talents about self. Target Date: 2023-05-24 Frequency: Biweekly  Progress: 0 Modality: individual  Related Interventions Assign the client the exercise of identifying his/her positive physical characteristics in a mirror to help him/her become more comfortable with himself/herself. Ask the client to keep building a list of positive traits and have him/her read the list at the beginning and end of each session (or assign "Acknowledging My Strengths" or "What Are My Good Qualities?" in the Adult Psychotherapy Homework Planner by [facility]); reinforce the client's positive self-descriptive statements. 8. Enhance ability to effectively cope with the full variety of life's worries and anxieties. 9. Establish an inward sense of self-worth, confidence, and competence. 10. Interact socially without undue distress or disability. 11. Learn and implement coping skills that result in a reduction of anxiety and worry, and improved daily functioning. 12. Let go of blame and begin to forgive others for pain caused in childhood. Objective Describe what it was like to grow up in the home environment. Target Date: 2023-05-24 Frequency: Biweekly  Progress: 0 Modality: individual  Related Interventions Develop the client's family genogram and/or symptom line and help identify patterns of dysfunction within the family. Objective Increase level of trust of others as shown by more socialization and greater intimacy tolerance. Target Date: 2023-05-24  Frequency: Biweekly  Progress: 0 Modality: individual  Related Interventions Teach the client the share-check method of building trust in relationships (sharing a little information and checking as to the recipient's sensitivity in reacting to that information). Teach the client the advantages of treating people as trustworthy given a reasonable amount of time to assess their character. Objective Describe each family member and identify the role each played within the family. Target Date: 2023-05-24 Frequency: Biweekly  Progress: 0 Modality: individual  Related Interventions Assist the client in clarifying his/her role within the family and his/her feelings connected to that role. Objective Identify feelings associated with major traumatic incidents in childhood and with parental child-rearing  patterns. Target Date: 2023-05-24 Frequency: Biweekly  Progress: 0 Modality: individual  Related Interventions Support and encourage the client when he/she begins to express feelings of rage, sadness, fear, and rejection relating to family abuse or neglect. Assign the client to record feelings in a journal that describes memories, behavior, and emotions tied to his/her traumatic childhood experiences (or assign "How the Trauma Affects Me" in the Adult Psychotherapy Homework Planner by Bryn Gulling). Ask the client to read books on the emotional effects of neglect and abuse in childhood (e.g., It Will Never Happen to Me by [facility]; Outgrowing the Pain by Artis Delay; Healing the Child Within by [facility]); process insights attained. 13. Reduce overall frequency, intensity, and duration of the anxiety so that daily functioning is not impaired. Objective Reestablish a consistent sleep-wake cycle. Target Date: 2023-05-24 Frequency: Biweekly  Progress: 0 Modality: individual  Related Interventions Teach and implement sleep hygiene practices to help the client reestablish a consistent sleep-wake cycle; review, reinforce  success, and provide corrective feedback toward improvement. Objective Identify, challenge, and replace biased, fearful self-talk with positive, realistic, and empowering self-talk. Target Date: 2023-05-24 Frequency: Biweekly  Progress: 0 Modality: individual  Related Interventions Explore the client's schema and self-talk that mediate his/her fear response; assist him/her in challenging the biases; replace the distorted messages with reality-based alternatives and positive, realistic self-talk that will increase his/her self-confidence in coping with irrational fears (see Cognitive Therapy of Anxiety Disorders by Alison Stalling). Assign the client a homework exercise in which he/she identifies fearful self-talk, identifies biases in the self-talk, generates alternatives, and tests through behavioral experiments (or assign "Negative Thoughts Trigger Negative Feelings" in the Adult Psychotherapy Homework Planner by [facility]); review and reinforce success, providing corrective feedback toward improvement. Objective Learn and implement problem-solving strategies for realistically addressing worries. Target Date: 2023-05-24 Frequency: Biweekly  Progress: 0 Modality: individual  Related Interventions Teach the client problem-solving strategies involving specifically defining a problem, generating options for addressing it, evaluating the pros and cons of each option, selecting and implementing an optional action, and reevaluating and refining the action (or assign "Applying Problem-Solving to Interpersonal Conflict" in the Adult Psychotherapy Homework Planner by Bryn Gulling). Objective Learn and implement calming skills to reduce overall anxiety and manage anxiety symptoms. Target Date: 2023-05-24 Frequency: Biweekly  Progress: 0 Modality: individual  Related Interventions Teach the client calming/relaxation skills (e.g., applied relaxation, progressive muscle relaxation, cue controlled relaxation; mindful  breathing; biofeedback) and how to discriminate better between relaxation and tension; teach the client how to apply these skills to his/her daily life (e.g., New Directions in Progressive Muscle Relaxation by Casper Harrison, and Hazlett-Stevens; Treating Generalized Anxiety Disorder by Rygh and Amparo Bristol). Assign the client to read about progressive muscle relaxation and other calming strategies in relevant books or treatment manuals (e.g., Progressive Relaxation Training by Gwynneth Aliment and Dani Gobble; Mastery of Your Anxiety and Worry: Workbook by Beckie Busing). Objective Learn and implement relapse prevention strategies for managing possible future anxiety symptoms. Target Date: 2023-05-24 Frequency: Biweekly  Progress: 0 Modality: individual  Related Interventions Discuss with the client the distinction between a lapse and relapse, associating a lapse with an initial and reversible return of worry, anxiety symptoms, or urges to avoid, and relapse with the decision to continue the fearful and avoidant patterns. Identify and rehearse with the client the management of future situations or circumstances in which lapses could occur. Instruct the client to routinely use new therapeutic skills (e.g., relaxation, cognitive restructuring, exposure, and problem-solving) in daily life to address emergent worries,  anxiety, and avoidant tendencies. Develop a "coping card" on which coping strategies and other important information (e.g., "Breathe deeply and relax," "Challenge unrealistic worries," "Use problem-solving") are written for the client's later use. Objective Identify the major life conflicts from the past and present that form the basis for present anxiety. Target Date: 2023-05-24 Frequency: Biweekly  Progress: 0 Modality: individual  Related Interventions Reinforce the client's insights into the role of his/her past emotional pain and present anxiety. Ask the client to develop and process a  list of key past and present life conflicts that continue to cause worry. Assist the client in becoming aware of key unresolved life conflicts and in starting to work toward their resolution. 14. Release the emotions associated with past childhood/family issues, resulting in less resentment and more serenity. 15. Resolve past childhood/family issues, leading to less anger and depression, greater self-esteem, security, and confidence. 16. Resolve the core conflict that is the source of anxiety. 17. Stabilize anxiety level while increasing ability to function on a daily basis.  Diagnosis:Major depressive disorder, recurrent episode, moderate (HCC)  Generalized anxiety disorder  Plan:  -meet online until in-person appointments and pt is available. -meet again on Wednesday, June 02, 2022 at [employer].

## 2022-05-24 NOTE — Progress Notes (Signed)
                Shannon [name], LCMHC °

## 2022-05-26 DIAGNOSIS — Z713 Dietary counseling and surveillance: Secondary | ICD-10-CM | POA: Diagnosis not present

## 2022-05-29 ENCOUNTER — Encounter: Payer: Self-pay | Admitting: Family

## 2022-05-30 MED ORDER — CLONAZEPAM 0.5 MG PO TABS: 0.5000 mg | ORAL_TABLET | Freq: Three times a day (TID) | ORAL | 0 refills | Status: DC | PRN

## 2022-06-02 ENCOUNTER — Ambulatory Visit (INDEPENDENT_AMBULATORY_CARE_PROVIDER_SITE_OTHER): Payer: BC Managed Care – PPO | Admitting: Professional

## 2022-06-02 ENCOUNTER — Encounter: Payer: Self-pay | Admitting: Professional

## 2022-06-02 DIAGNOSIS — F331 Major depressive disorder, recurrent, moderate: Secondary | ICD-10-CM | POA: Diagnosis not present

## 2022-06-02 DIAGNOSIS — F411 Generalized anxiety disorder: Secondary | ICD-10-CM | POA: Diagnosis not present

## 2022-06-02 DIAGNOSIS — F4310 Post-traumatic stress disorder, unspecified: Secondary | ICD-10-CM

## 2022-06-02 NOTE — Progress Notes (Signed)
[place] Counselor/Therapist Progress Note  Patient ID: Shannon [name], MRN: [id number],    Date: 06/02/2022  Time Spent: 55 minutes [id number]   Treatment Type: Individual Therapy  Risk Assessment: Danger to Self:  No Self-injurious Behavior: No Danger to Others: No  Subjective: This session was held via video teletherapy. The patient consented to video teletherapy and was located at her home during this session. She is aware it is the responsibility of the patient to secure confidentiality on her end of the session. The provider was in a private home office for the duration of this session.    The patient arrived on time for her webex session.   Issues addressed: 1-family roles -[name] was the golden child, parentified -Merry Proud is the addict, the lost child -pt was the rebel -her mother resented her when she realized how much her father loved her -when Merry Proud came her mother's resentment grew because her father was so happy to have a boy -her mother told [name] when she graduated high school that she will never forget how angry her father was when he found out they were going to have another child   -pt assured [name] that her father was only happy  -[name] was head of the cheerleading squad, homecoming queen -pt's mother was not liked by her grandmother, her youngest brother Legrand Como and sister Jamas Lav got involved in drugs and alcohol 2-how to manage -rage room to release some of pent up anger -writing her thoughts and feelings on paper to begin purging -talking to her mother while she is still alive -destroying the old "tape" and replacing with a hew one  Treatment Plan Problems Addressed  Anxiety, Childhood Trauma, Low Self-Esteem, Unipolar Depression Goals 1. Alleviate depressive symptoms and return to previous level of effective functioning. 2. Appropriately grieve the loss in order to normalize mood and to return to previously adaptive level of  functioning. Objective Learn and implement behavioral strategies to overcome depression. Target Date: 2023-05-24 Frequency: Biweekly  Progress: 0 Modality: individual  Related Interventions Engage the client in "behavioral activation," increasing his/her activity level and contact with sources of reward, while identifying processes that inhibit activation (see Behavioral Activation for Depression by Beverly Gust, Dimidjian, and Herman-Dunn; or assign "Identify and Schedule Pleasant Activities" in the Adult Psychotherapy Homework Planner by [facility]); use behavioral techniques such as instruction, rehearsal, role-playing, role reversal, as needed, to facilitate activity in the client's daily life; reinforce success. Assist the client in developing skills that increase the likelihood of deriving pleasure from behavioral activation (e.g., assertiveness skills, developing an exercise plan, less internal/more external focus, increased social involvement); reinforce success. Objective Learn and implement conflict resolution skills to resolve interpersonal problems. Target Date: 2023-05-24 Frequency: Biweekly  Progress: 0 Modality: individual  Related Interventions Teach conflict resolution skills (e.g., empathy, active listening, "I messages," respectful communication, assertiveness without aggression, compromise); use psychoeducation, modeling, role-playing, and rehearsal to work through several current conflicts; assign homework exercises; review and repeat so as to integrate their use into the client's life. Objective Identify important people in life, past and present, and describe the quality, good and poor, of those relationships. Target Date: 2023-05-24 Frequency: Biweekly  Progress: 0 Modality: individual  Objective Learn and implement relapse prevention skills. Target Date: 2023-05-24 Frequency: Biweekly  Progress: 0 Modality: individual  Related Interventions Identify and rehearse with the  client the management of future situations or circumstances in which lapses could occur. Objective Verbalize an understanding of healthy and unhealthy emotions with the intent of increasing  the use of healthy emotions to guide actions. Target Date: 2023-05-24 Frequency: Biweekly  Progress: 0 Modality: individual  Related Interventions Use a process-experiential approach consistent with Emotion-Focused Therapy to create a safe, nurturing environment in which the client can process emotions, learning to identify and regulate unhealthy feelings and to generate more adaptive ones that then guide actions (see Emotion-Focused Therapy for Depression by Andrey Spearman). 3. Demonstrate improved self-esteem through more pride in appearance, more assertiveness, greater eye contact, and identification of positive traits in self-talk messages. 4. Develop an awareness of how childhood issues have affected and continue to affect one's family life. 5. Develop healthy interpersonal relationships that lead to the alleviation and help prevent the relapse of depression. 6. Develop healthy thinking patterns and beliefs about self, others, and the world that lead to the alleviation and help prevent the relapse of depression. 7. Elevate self-esteem. Objective Demonstrate an increased ability to identify and express personal feelings. Target Date: 2023-05-24 Frequency: Biweekly  Progress: 0 Modality: individual  Related Interventions Assist the client in identifying and labeling emotions. Objective Acknowledge feeling less competent than most others. Target Date: 2023-05-24 Frequency: Biweekly  Progress: 0 Modality: individual  Related Interventions Explore the client's assessment of himself/herself and what is verbalized as the basis for negative self-perception. Actively build the level of trust with the client in individual sessions through consistent eye contact, active listening, unconditional positive  regard, and warm acceptance to help increase his/her ability to identify and express feelings. Objective Decrease the frequency of negative self-descriptive statements and increase frequency of positive self-descriptive statements. Target Date: 2023-05-24 Frequency: Biweekly  Progress: 0 Modality: individual  Related Interventions Assist the client in becoming aware of how he/she expresses or acts out negative feelings about himself/herself. Help the client reframe his/her negative assessment of himself/herself. Assist the client in developing positive self-talk as a way of boosting his/her confidence and self-image (or assign "Positive Self-Talk" in the Adult Psychotherapy Homework Planner by Bryn Gulling). Objective Identify and engage in activities that would improve self-image by being consistent with one's values. Target Date: 2023-05-24 Frequency: Biweekly  Progress: 0 Modality: individual  Related Interventions Help the client analyze his/her values and the congruence or incongruence between them and the client's daily activities. Identify and assign activities congruent with the client's values; process them toward improving self-concept and self-esteem. Objective Identify positive traits and talents about self. Target Date: 2023-05-24 Frequency: Biweekly  Progress: 0 Modality: individual  Related Interventions Assign the client the exercise of identifying his/her positive physical characteristics in a mirror to help him/her become more comfortable with himself/herself. Ask the client to keep building a list of positive traits and have him/her read the list at the beginning and end of each session (or assign "Acknowledging My Strengths" or "What Are My Good Qualities?" in the Adult Psychotherapy Homework Planner by [facility]); reinforce the client's positive self-descriptive statements. 8. Enhance ability to effectively cope with the full variety of life's worries and anxieties. 9. Establish  an inward sense of self-worth, confidence, and competence. 10. Interact socially without undue distress or disability. 11. Learn and implement coping skills that result in a reduction of anxiety and worry, and improved daily functioning. 12. Let go of blame and begin to forgive others for pain caused in childhood. Objective Describe what it was like to grow up in the home environment. Target Date: 2023-05-24 Frequency: Biweekly  Progress: 0 Modality: individual  Related Interventions Develop the client's family genogram and/or symptom line and help identify patterns  of dysfunction within the family. Objective Increase level of trust of others as shown by more socialization and greater intimacy tolerance. Target Date: 2023-05-24 Frequency: Biweekly  Progress: 0 Modality: individual  Related Interventions Teach the client the share-check method of building trust in relationships (sharing a little information and checking as to the recipient's sensitivity in reacting to that information). Teach the client the advantages of treating people as trustworthy given a reasonable amount of time to assess their character. Objective Describe each family member and identify the role each played within the family. Target Date: 2023-05-24 Frequency: Biweekly  Progress: 0 Modality: individual  Related Interventions Assist the client in clarifying his/her role within the family and his/her feelings connected to that role. Objective Identify feelings associated with major traumatic incidents in childhood and with parental child-rearing patterns. Target Date: 2023-05-24 Frequency: Biweekly  Progress: 0 Modality: individual  Related Interventions Support and encourage the client when he/she begins to express feelings of rage, sadness, fear, and rejection relating to family abuse or neglect. Assign the client to record feelings in a journal that describes memories, behavior, and emotions tied to his/her  traumatic childhood experiences (or assign "How the Trauma Affects Me" in the Adult Psychotherapy Homework Planner by Bryn Gulling). Ask the client to read books on the emotional effects of neglect and abuse in childhood (e.g., It Will Never Happen to Me by [facility]; Outgrowing the Pain by Artis Delay; Healing the Child Within by [facility]); process insights attained. 13. Reduce overall frequency, intensity, and duration of the anxiety so that daily functioning is not impaired. Objective Reestablish a consistent sleep-wake cycle. Target Date: 2023-05-24 Frequency: Biweekly  Progress: 0 Modality: individual  Related Interventions Teach and implement sleep hygiene practices to help the client reestablish a consistent sleep-wake cycle; review, reinforce success, and provide corrective feedback toward improvement. Objective Identify, challenge, and replace biased, fearful self-talk with positive, realistic, and empowering self-talk. Target Date: 2023-05-24 Frequency: Biweekly  Progress: 0 Modality: individual  Related Interventions Explore the client's schema and self-talk that mediate his/her fear response; assist him/her in challenging the biases; replace the distorted messages with reality-based alternatives and positive, realistic self-talk that will increase his/her self-confidence in coping with irrational fears (see Cognitive Therapy of Anxiety Disorders by Alison Stalling). Assign the client a homework exercise in which he/she identifies fearful self-talk, identifies biases in the self-talk, generates alternatives, and tests through behavioral experiments (or assign "Negative Thoughts Trigger Negative Feelings" in the Adult Psychotherapy Homework Planner by [facility]); review and reinforce success, providing corrective feedback toward improvement. Objective Learn and implement problem-solving strategies for realistically addressing worries. Target Date: 2023-05-24 Frequency: Biweekly  Progress: 0 Modality:  individual  Related Interventions Teach the client problem-solving strategies involving specifically defining a problem, generating options for addressing it, evaluating the pros and cons of each option, selecting and implementing an optional action, and reevaluating and refining the action (or assign "Applying Problem-Solving to Interpersonal Conflict" in the Adult Psychotherapy Homework Planner by Bryn Gulling). Objective Learn and implement calming skills to reduce overall anxiety and manage anxiety symptoms. Target Date: 2023-05-24 Frequency: Biweekly  Progress: 0 Modality: individual  Related Interventions Teach the client calming/relaxation skills (e.g., applied relaxation, progressive muscle relaxation, cue controlled relaxation; mindful breathing; biofeedback) and how to discriminate better between relaxation and tension; teach the client how to apply these skills to his/her daily life (e.g., New Directions in Progressive Muscle Relaxation by Casper Harrison, and Hazlett-Stevens; Treating Generalized Anxiety Disorder by Rygh and Amparo Bristol). Assign the client to read about  progressive muscle relaxation and other calming strategies in relevant books or treatment manuals (e.g., Progressive Relaxation Training by Leroy Kennedy; Mastery of Your Anxiety and Worry: Workbook by Beckie Busing). Objective Learn and implement relapse prevention strategies for managing possible future anxiety symptoms. Target Date: 2023-05-24 Frequency: Biweekly  Progress: 0 Modality: individual  Related Interventions Discuss with the client the distinction between a lapse and relapse, associating a lapse with an initial and reversible return of worry, anxiety symptoms, or urges to avoid, and relapse with the decision to continue the fearful and avoidant patterns. Identify and rehearse with the client the management of future situations or circumstances in which lapses could occur. Instruct the client to  routinely use new therapeutic skills (e.g., relaxation, cognitive restructuring, exposure, and problem-solving) in daily life to address emergent worries, anxiety, and avoidant tendencies. Develop a "coping card" on which coping strategies and other important information (e.g., "Breathe deeply and relax," "Challenge unrealistic worries," "Use problem-solving") are written for the client's later use. Objective Identify the major life conflicts from the past and present that form the basis for present anxiety. Target Date: 2023-05-24 Frequency: Biweekly  Progress: 0 Modality: individual  Related Interventions Reinforce the client's insights into the role of his/her past emotional pain and present anxiety. Ask the client to develop and process a list of key past and present life conflicts that continue to cause worry. Assist the client in becoming aware of key unresolved life conflicts and in starting to work toward their resolution. 14. Release the emotions associated with past childhood/family issues, resulting in less resentment and more serenity. 15. Resolve past childhood/family issues, leading to less anger and depression, greater self-esteem, security, and confidence. 16. Resolve the core conflict that is the source of anxiety. 17. Stabilize anxiety level while increasing ability to function on a daily basis.  Diagnosis:Major depressive disorder, recurrent episode, moderate (HCC)  Generalized anxiety disorder  PTSD (post-traumatic stress disorder)  Plan:  -meet online until in-person appointments and pt is available. -meet again on Thursday, July 08, 2022 at 11am in person.

## 2022-06-03 ENCOUNTER — Other Ambulatory Visit: Payer: Self-pay | Admitting: Family

## 2022-06-15 DIAGNOSIS — Z713 Dietary counseling and surveillance: Secondary | ICD-10-CM | POA: Diagnosis not present

## 2022-06-17 DIAGNOSIS — M5451 Vertebrogenic low back pain: Secondary | ICD-10-CM | POA: Diagnosis not present

## 2022-06-21 DIAGNOSIS — M961 Postlaminectomy syndrome, not elsewhere classified: Secondary | ICD-10-CM | POA: Diagnosis not present

## 2022-06-21 DIAGNOSIS — M25551 Pain in right hip: Secondary | ICD-10-CM | POA: Diagnosis not present

## 2022-06-21 DIAGNOSIS — Z713 Dietary counseling and surveillance: Secondary | ICD-10-CM | POA: Diagnosis not present

## 2022-06-23 DIAGNOSIS — M25551 Pain in right hip: Secondary | ICD-10-CM | POA: Diagnosis not present

## 2022-06-24 ENCOUNTER — Telehealth: Payer: Self-pay

## 2022-06-24 NOTE — Telephone Encounter (Signed)
Caller Name [place] Phone Number [phone] Patient Name Shannon [name] Patient DOB February 10, 2061 Call Type Message Only Information Provided Reason for Call Request to Schedule Office Appointment Initial Comment Caller states she needs to make an appt. Disp. Time Disposition Final User 06/24/2022 8:07:21 AM General Information Provided Yes Green, [name] Call Closed By: Philis Kendall Transaction Date/Time: 06/24/2022 8:04:05 AM (ET)

## 2022-06-25 NOTE — Telephone Encounter (Signed)
Called patient but no answer, left voice mail for patinet to call back to set up appointment or she can also schedule on line

## 2022-06-30 ENCOUNTER — Other Ambulatory Visit: Payer: Self-pay | Admitting: Family

## 2022-07-01 ENCOUNTER — Encounter: Payer: Self-pay | Admitting: Neurology

## 2022-07-01 ENCOUNTER — Encounter: Payer: Self-pay | Admitting: Professional

## 2022-07-01 ENCOUNTER — Ambulatory Visit (INDEPENDENT_AMBULATORY_CARE_PROVIDER_SITE_OTHER): Payer: BC Managed Care – PPO | Admitting: Professional

## 2022-07-01 DIAGNOSIS — F4310 Post-traumatic stress disorder, unspecified: Secondary | ICD-10-CM

## 2022-07-01 DIAGNOSIS — F331 Major depressive disorder, recurrent, moderate: Secondary | ICD-10-CM | POA: Diagnosis not present

## 2022-07-01 DIAGNOSIS — F411 Generalized anxiety disorder: Secondary | ICD-10-CM

## 2022-07-01 DIAGNOSIS — Z713 Dietary counseling and surveillance: Secondary | ICD-10-CM | POA: Diagnosis not present
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
## 2022-07-08 ENCOUNTER — Encounter: Payer: Self-pay | Admitting: Professional

## 2022-07-08 ENCOUNTER — Ambulatory Visit: Payer: BC Managed Care – PPO | Admitting: Professional

## 2022-07-08 DIAGNOSIS — F4310 Post-traumatic stress disorder, unspecified: Secondary | ICD-10-CM

## 2022-07-08 DIAGNOSIS — Z713 Dietary counseling and surveillance: Secondary | ICD-10-CM | POA: Diagnosis not present

## 2022-07-08 DIAGNOSIS — F411 Generalized anxiety disorder: Secondary | ICD-10-CM

## 2022-07-08 DIAGNOSIS — F331 Major depressive disorder, recurrent, moderate: Secondary | ICD-10-CM | POA: Diagnosis not present

## 2022-07-08 NOTE — Progress Notes (Signed)
[place] Counselor/Therapist Progress Note  Patient ID: Shannon [name], MRN: [id number],    Date: 07/08/2022  Time Spent: 53 minutes [id number]   Treatment Type: Individual Therapy  Risk Assessment: Danger to Self:  No Self-injurious Behavior: No Danger to Others: No  Subjective: This session was held via video teletherapy. The patient consented to video teletherapy and was located at her home during this session. She is aware it is the responsibility of the patient to secure confidentiality on her end of the session. The provider was in a private home office for the duration of this session.    The patient arrived on time for her webex session.   Issues addressed: 1-mood -anxious and took a Klonopin to try and avoid crying the entire time 2-mother a-pt has been seeing her mother more frequently to check in b-"they (nursing home) never calls [place]" c-she senses that her mother knows she is dying -her mother asked what she is going to to do when she is gone -pt reassured her that she would be okay and that her mother would always be with her -her parents house is now fairly empty, tonight the family room will be empty 3-grief -never got to be sad -crisis after crisis and put saying goodbye on the back burner    Treatment Plan Problems Addressed  Anxiety, Childhood Trauma, Low Self-Esteem, Unipolar Depression Goals 1. Alleviate depressive symptoms and return to previous level of effective functioning. 2. Appropriately grieve the loss in order to normalize mood and to return to previously adaptive level of functioning. Objective Learn and implement behavioral strategies to overcome depression. Target Date: 2023-05-24 Frequency: Biweekly  Progress: 0 Modality: individual  Related Interventions Engage the client in "behavioral activation," increasing his/her activity level and contact with sources of reward, while identifying processes that inhibit activation  (see Behavioral Activation for Depression by Beverly Gust, Dimidjian, and Herman-Dunn; or assign "Identify and Schedule Pleasant Activities" in the Adult Psychotherapy Homework Planner by [facility]); use behavioral techniques such as instruction, rehearsal, role-playing, role reversal, as needed, to facilitate activity in the client's daily life; reinforce success. Assist the client in developing skills that increase the likelihood of deriving pleasure from behavioral activation (e.g., assertiveness skills, developing an exercise plan, less internal/more external focus, increased social involvement); reinforce success. Objective Learn and implement conflict resolution skills to resolve interpersonal problems. Target Date: 2023-05-24 Frequency: Biweekly  Progress: 0 Modality: individual  Related Interventions Teach conflict resolution skills (e.g., empathy, active listening, "I messages," respectful communication, assertiveness without aggression, compromise); use psychoeducation, modeling, role-playing, and rehearsal to work through several current conflicts; assign homework exercises; review and repeat so as to integrate their use into the client's life. Objective Identify important people in life, past and present, and describe the quality, good and poor, of those relationships. Target Date: 2023-05-24 Frequency: Biweekly  Progress: 0 Modality: individual  Objective Learn and implement relapse prevention skills. Target Date: 2023-05-24 Frequency: Biweekly  Progress: 0 Modality: individual  Related Interventions Identify and rehearse with the client the management of future situations or circumstances in which lapses could occur. Objective Verbalize an understanding of healthy and unhealthy emotions with the intent of increasing the use of healthy emotions to guide actions. Target Date: 2023-05-24 Frequency: Biweekly  Progress: 0 Modality: individual  Related Interventions Use a process-experiential  approach consistent with Emotion-Focused Therapy to create a safe, nurturing environment in which the client can process emotions, learning to identify and regulate unhealthy feelings and to generate more adaptive ones that then  guide actions (see Emotion-Focused Therapy for Depression by Andrey Spearman). 3. Demonstrate improved self-esteem through more pride in appearance, more assertiveness, greater eye contact, and identification of positive traits in self-talk messages. 4. Develop an awareness of how childhood issues have affected and continue to affect one's family life. 5. Develop healthy interpersonal relationships that lead to the alleviation and help prevent the relapse of depression. 6. Develop healthy thinking patterns and beliefs about self, others, and the world that lead to the alleviation and help prevent the relapse of depression. 7. Elevate self-esteem. Objective Demonstrate an increased ability to identify and express personal feelings. Target Date: 2023-05-24 Frequency: Biweekly  Progress: 0 Modality: individual  Related Interventions Assist the client in identifying and labeling emotions. Objective Acknowledge feeling less competent than most others. Target Date: 2023-05-24 Frequency: Biweekly  Progress: 0 Modality: individual  Related Interventions Explore the client's assessment of himself/herself and what is verbalized as the basis for negative self-perception. Actively build the level of trust with the client in individual sessions through consistent eye contact, active listening, unconditional positive regard, and warm acceptance to help increase his/her ability to identify and express feelings. Objective Decrease the frequency of negative self-descriptive statements and increase frequency of positive self-descriptive statements. Target Date: 2023-05-24 Frequency: Biweekly  Progress: 0 Modality: individual  Related Interventions Assist the client in becoming  aware of how he/she expresses or acts out negative feelings about himself/herself. Help the client reframe his/her negative assessment of himself/herself. Assist the client in developing positive self-talk as a way of boosting his/her confidence and self-image (or assign "Positive Self-Talk" in the Adult Psychotherapy Homework Planner by Bryn Gulling). Objective Identify and engage in activities that would improve self-image by being consistent with one's values. Target Date: 2023-05-24 Frequency: Biweekly  Progress: 0 Modality: individual  Related Interventions Help the client analyze his/her values and the congruence or incongruence between them and the client's daily activities. Identify and assign activities congruent with the client's values; process them toward improving self-concept and self-esteem. Objective Identify positive traits and talents about self. Target Date: 2023-05-24 Frequency: Biweekly  Progress: 0 Modality: individual  Related Interventions Assign the client the exercise of identifying his/her positive physical characteristics in a mirror to help him/her become more comfortable with himself/herself. Ask the client to keep building a list of positive traits and have him/her read the list at the beginning and end of each session (or assign "Acknowledging My Strengths" or "What Are My Good Qualities?" in the Adult Psychotherapy Homework Planner by [facility]); reinforce the client's positive self-descriptive statements. 8. Enhance ability to effectively cope with the full variety of life's worries and anxieties. 9. Establish an inward sense of self-worth, confidence, and competence. 10. Interact socially without undue distress or disability. 11. Learn and implement coping skills that result in a reduction of anxiety and worry, and improved daily functioning. 12. Let go of blame and begin to forgive others for pain caused in childhood. Objective Describe what it was like to grow up  in the home environment. Target Date: 2023-05-24 Frequency: Biweekly  Progress: 0 Modality: individual  Related Interventions Develop the client's family genogram and/or symptom line and help identify patterns of dysfunction within the family. Objective Increase level of trust of others as shown by more socialization and greater intimacy tolerance. Target Date: 2023-05-24 Frequency: Biweekly  Progress: 0 Modality: individual  Related Interventions Teach the client the share-check method of building trust in relationships (sharing a little information and checking as to the recipient's sensitivity in  reacting to that information). Teach the client the advantages of treating people as trustworthy given a reasonable amount of time to assess their character. Objective Describe each family member and identify the role each played within the family. Target Date: 2023-05-24 Frequency: Biweekly  Progress: 0 Modality: individual  Related Interventions Assist the client in clarifying his/her role within the family and his/her feelings connected to that role. Objective Identify feelings associated with major traumatic incidents in childhood and with parental child-rearing patterns. Target Date: 2023-05-24 Frequency: Biweekly  Progress: 0 Modality: individual  Related Interventions Support and encourage the client when he/she begins to express feelings of rage, sadness, fear, and rejection relating to family abuse or neglect. Assign the client to record feelings in a journal that describes memories, behavior, and emotions tied to his/her traumatic childhood experiences (or assign "How the Trauma Affects Me" in the Adult Psychotherapy Homework Planner by Bryn Gulling). Ask the client to read books on the emotional effects of neglect and abuse in childhood (e.g., It Will Never Happen to Me by [facility]; Outgrowing the Pain by Artis Delay; Healing the Child Within by [facility]); process insights attained. 13. Reduce  overall frequency, intensity, and duration of the anxiety so that daily functioning is not impaired. Objective Reestablish a consistent sleep-wake cycle. Target Date: 2023-05-24 Frequency: Biweekly  Progress: 0 Modality: individual  Related Interventions Teach and implement sleep hygiene practices to help the client reestablish a consistent sleep-wake cycle; review, reinforce success, and provide corrective feedback toward improvement. Objective Identify, challenge, and replace biased, fearful self-talk with positive, realistic, and empowering self-talk. Target Date: 2023-05-24 Frequency: Biweekly  Progress: 0 Modality: individual  Related Interventions Explore the client's schema and self-talk that mediate his/her fear response; assist him/her in challenging the biases; replace the distorted messages with reality-based alternatives and positive, realistic self-talk that will increase his/her self-confidence in coping with irrational fears (see Cognitive Therapy of Anxiety Disorders by Alison Stalling). Assign the client a homework exercise in which he/she identifies fearful self-talk, identifies biases in the self-talk, generates alternatives, and tests through behavioral experiments (or assign "Negative Thoughts Trigger Negative Feelings" in the Adult Psychotherapy Homework Planner by [facility]); review and reinforce success, providing corrective feedback toward improvement. Objective Learn and implement problem-solving strategies for realistically addressing worries. Target Date: 2023-05-24 Frequency: Biweekly  Progress: 0 Modality: individual  Related Interventions Teach the client problem-solving strategies involving specifically defining a problem, generating options for addressing it, evaluating the pros and cons of each option, selecting and implementing an optional action, and reevaluating and refining the action (or assign "Applying Problem-Solving to Interpersonal Conflict" in the Adult  Psychotherapy Homework Planner by Bryn Gulling). Objective Learn and implement calming skills to reduce overall anxiety and manage anxiety symptoms. Target Date: 2023-05-24 Frequency: Biweekly  Progress: 0 Modality: individual  Related Interventions Teach the client calming/relaxation skills (e.g., applied relaxation, progressive muscle relaxation, cue controlled relaxation; mindful breathing; biofeedback) and how to discriminate better between relaxation and tension; teach the client how to apply these skills to his/her daily life (e.g., New Directions in Progressive Muscle Relaxation by Casper Harrison, and Hazlett-Stevens; Treating Generalized Anxiety Disorder by Rygh and Amparo Bristol). Assign the client to read about progressive muscle relaxation and other calming strategies in relevant books or treatment manuals (e.g., Progressive Relaxation Training by Gwynneth Aliment and Dani Gobble; Mastery of Your Anxiety and Worry: Workbook by Beckie Busing). Objective Learn and implement relapse prevention strategies for managing possible future anxiety symptoms. Target Date: 2023-05-24 Frequency: Biweekly  Progress: 0 Modality: individual  Related  Interventions Discuss with the client the distinction between a lapse and relapse, associating a lapse with an initial and reversible return of worry, anxiety symptoms, or urges to avoid, and relapse with the decision to continue the fearful and avoidant patterns. Identify and rehearse with the client the management of future situations or circumstances in which lapses could occur. Instruct the client to routinely use new therapeutic skills (e.g., relaxation, cognitive restructuring, exposure, and problem-solving) in daily life to address emergent worries, anxiety, and avoidant tendencies. Develop a "coping card" on which coping strategies and other important information (e.g., "Breathe deeply and relax," "Challenge unrealistic worries," "Use problem-solving") are  written for the client's later use. Objective Identify the major life conflicts from the past and present that form the basis for present anxiety. Target Date: 2023-05-24 Frequency: Biweekly  Progress: 0 Modality: individual  Related Interventions Reinforce the client's insights into the role of his/her past emotional pain and present anxiety. Ask the client to develop and process a list of key past and present life conflicts that continue to cause worry. Assist the client in becoming aware of key unresolved life conflicts and in starting to work toward their resolution. 14. Release the emotions associated with past childhood/family issues, resulting in less resentment and more serenity. 15. Resolve past childhood/family issues, leading to less anger and depression, greater self-esteem, security, and confidence. 16. Resolve the core conflict that is the source of anxiety. 17. Stabilize anxiety level while increasing ability to function on a daily basis.  Diagnosis:Major depressive disorder, recurrent episode, moderate (HCC)  Generalized anxiety disorder  PTSD (post-traumatic stress disorder)  Plan:  -consider how she might talk to her mother while she is still living -meet again on Thursday, July 15, 2022 at 1pm in person.

## 2022-07-12 MED ORDER — BUPROPION HCL ER (XL) 300 MG PO TB24: 300.0000 mg | ORAL_TABLET | Freq: Every day | ORAL | 1 refills | Status: DC

## 2022-07-12 NOTE — Telephone Encounter (Signed)
ERROR

## 2022-07-14 DIAGNOSIS — M5451 Vertebrogenic low back pain: Secondary | ICD-10-CM | POA: Diagnosis not present

## 2022-07-15 ENCOUNTER — Ambulatory Visit: Payer: BC Managed Care – PPO | Admitting: Professional

## 2022-07-15 ENCOUNTER — Encounter: Payer: Self-pay | Admitting: Professional

## 2022-07-15 DIAGNOSIS — F331 Major depressive disorder, recurrent, moderate: Secondary | ICD-10-CM | POA: Diagnosis not present

## 2022-07-15 DIAGNOSIS — F411 Generalized anxiety disorder: Secondary | ICD-10-CM

## 2022-07-15 DIAGNOSIS — F4310 Post-traumatic stress disorder, unspecified: Secondary | ICD-10-CM | POA: Diagnosis not present

## 2022-07-15 NOTE — Progress Notes (Signed)
[facility] Counselor/Therapist Progress Note  Patient ID: Shannon [name], MRN: [id number],    Date: 07/15/2022  Time Spent: 59 minutes 1-159pm   Treatment Type: Individual Therapy  Risk Assessment: Danger to Self:  No Self-injurious Behavior: No Danger to Others: No  Subjective: This session was held via video teletherapy. The patient consented to video teletherapy and was located at her home during this session. She is aware it is the responsibility of the patient to secure confidentiality on her end of the session. The provider was in a private home office for the duration of this session.    The patient arrived on time for her webex session.   Issues addressed: 1-homework- made one statement to her mother about being there for her and how she will not need to be alone a-consider how she might talk to her mother while she is still living 2-mood a-pleasant, at times tearful, melancholy related to planned visit this weekend to parents home 2-childhood issues a-mother's alcoholism and degradation toward pt b-frequent moves for pt when school age, not experienced by her sister [name] -[name] attended only one school while patient attended many c-never felt as smart and her brother and sister d-consideration of her and mother's behaviors resulting from dissatisfaction from frequent moves 3-self-esteem -feels "icky" come over her when she hears a compliment -has trouble accepting compliment but has been trying to be more thankful -positively reinforcing herself -positive vs negative thinking -how to move from beating herself up to building herself up -asking spouse to reinforce her accepting his compliments -daily positive affirmation on Building control surveyor   Treatment Plan Problems Addressed  Anxiety, Childhood Trauma, Low Self-Esteem, Unipolar Depression Goals 1. Alleviate depressive symptoms and return to previous level of effective functioning. 2.  Appropriately grieve the loss in order to normalize mood and to return to previously adaptive level of functioning. Objective Learn and implement behavioral strategies to overcome depression. Target Date: 2023-05-24 Frequency: Biweekly  Progress: 0 Modality: individual  Related Interventions Engage the client in "behavioral activation," increasing his/her activity level and contact with sources of reward, while identifying processes that inhibit activation (see Behavioral Activation for Depression by Beverly Gust, Dimidjian, and Herman-Dunn; or assign "Identify and Schedule Pleasant Activities" in the Adult Psychotherapy Homework Planner by [facility]); use behavioral techniques such as instruction, rehearsal, role-playing, role reversal, as needed, to facilitate activity in the client's daily life; reinforce success. Assist the client in developing skills that increase the likelihood of deriving pleasure from behavioral activation (e.g., assertiveness skills, developing an exercise plan, less internal/more external focus, increased social involvement); reinforce success. Objective Learn and implement conflict resolution skills to resolve interpersonal problems. Target Date: 2023-05-24 Frequency: Biweekly  Progress: 0 Modality: individual  Related Interventions Teach conflict resolution skills (e.g., empathy, active listening, "I messages," respectful communication, assertiveness without aggression, compromise); use psychoeducation, modeling, role-playing, and rehearsal to work through several current conflicts; assign homework exercises; review and repeat so as to integrate their use into the client's life. Objective Identify important people in life, past and present, and describe the quality, good and poor, of those relationships. Target Date: 2023-05-24 Frequency: Biweekly  Progress: 0 Modality: individual  Objective Learn and implement relapse prevention skills. Target Date: 2023-05-24 Frequency:  Biweekly  Progress: 0 Modality: individual  Related Interventions Identify and rehearse with the client the management of future situations or circumstances in which lapses could occur. Objective Verbalize an understanding of healthy and unhealthy emotions with the intent of increasing the use of healthy emotions  to guide actions. Target Date: 2023-05-24 Frequency: Biweekly  Progress: 0 Modality: individual  Related Interventions Use a process-experiential approach consistent with Emotion-Focused Therapy to create a safe, nurturing environment in which the client can process emotions, learning to identify and regulate unhealthy feelings and to generate more adaptive ones that then guide actions (see Emotion-Focused Therapy for Depression by Andrey Spearman). 3. Demonstrate improved self-esteem through more pride in appearance, more assertiveness, greater eye contact, and identification of positive traits in self-talk messages. 4. Develop an awareness of how childhood issues have affected and continue to affect one's family life. 5. Develop healthy interpersonal relationships that lead to the alleviation and help prevent the relapse of depression. 6. Develop healthy thinking patterns and beliefs about self, others, and the world that lead to the alleviation and help prevent the relapse of depression. 7. Elevate self-esteem. Objective Demonstrate an increased ability to identify and express personal feelings. Target Date: 2023-05-24 Frequency: Biweekly  Progress: 0 Modality: individual  Related Interventions Assist the client in identifying and labeling emotions. Objective Acknowledge feeling less competent than most others. Target Date: 2023-05-24 Frequency: Biweekly  Progress: 0 Modality: individual  Related Interventions Explore the client's assessment of himself/herself and what is verbalized as the basis for negative self-perception. Actively build the level of trust with the  client in individual sessions through consistent eye contact, active listening, unconditional positive regard, and warm acceptance to help increase his/her ability to identify and express feelings. Objective Decrease the frequency of negative self-descriptive statements and increase frequency of positive self-descriptive statements. Target Date: 2023-05-24 Frequency: Biweekly  Progress: 0 Modality: individual  Related Interventions Assist the client in becoming aware of how he/she expresses or acts out negative feelings about himself/herself. Help the client reframe his/her negative assessment of himself/herself. Assist the client in developing positive self-talk as a way of boosting his/her confidence and self-image (or assign "Positive Self-Talk" in the Adult Psychotherapy Homework Planner by Bryn Gulling). Objective Identify and engage in activities that would improve self-image by being consistent with one's values. Target Date: 2023-05-24 Frequency: Biweekly  Progress: 0 Modality: individual  Related Interventions Help the client analyze his/her values and the congruence or incongruence between them and the client's daily activities. Identify and assign activities congruent with the client's values; process them toward improving self-concept and self-esteem. Objective Identify positive traits and talents about self. Target Date: 2023-05-24 Frequency: Biweekly  Progress: 0 Modality: individual  Related Interventions Assign the client the exercise of identifying his/her positive physical characteristics in a mirror to help him/her become more comfortable with himself/herself. Ask the client to keep building a list of positive traits and have him/her read the list at the beginning and end of each session (or assign "Acknowledging My Strengths" or "What Are My Good Qualities?" in the Adult Psychotherapy Homework Planner by [facility]); reinforce the client's positive self-descriptive statements. 8.  Enhance ability to effectively cope with the full variety of life's worries and anxieties. 9. Establish an inward sense of self-worth, confidence, and competence. 10. Interact socially without undue distress or disability. 11. Learn and implement coping skills that result in a reduction of anxiety and worry, and improved daily functioning. 12. Let go of blame and begin to forgive others for pain caused in childhood. Objective Describe what it was like to grow up in the home environment. Target Date: 2023-05-24 Frequency: Biweekly  Progress: 0 Modality: individual  Related Interventions Develop the client's family genogram and/or symptom line and help identify patterns of dysfunction within the family.  Objective Increase level of trust of others as shown by more socialization and greater intimacy tolerance. Target Date: 2023-05-24 Frequency: Biweekly  Progress: 0 Modality: individual  Related Interventions Teach the client the share-check method of building trust in relationships (sharing a little information and checking as to the recipient's sensitivity in reacting to that information). Teach the client the advantages of treating people as trustworthy given a reasonable amount of time to assess their character. Objective Describe each family member and identify the role each played within the family. Target Date: 2023-05-24 Frequency: Biweekly  Progress: 0 Modality: individual  Related Interventions Assist the client in clarifying his/her role within the family and his/her feelings connected to that role. Objective Identify feelings associated with major traumatic incidents in childhood and with parental child-rearing patterns. Target Date: 2023-05-24 Frequency: Biweekly  Progress: 0 Modality: individual  Related Interventions Support and encourage the client when he/she begins to express feelings of rage, sadness, fear, and rejection relating to family abuse or neglect. Assign the  client to record feelings in a journal that describes memories, behavior, and emotions tied to his/her traumatic childhood experiences (or assign "How the Trauma Affects Me" in the Adult Psychotherapy Homework Planner by Bryn Gulling). Ask the client to read books on the emotional effects of neglect and abuse in childhood (e.g., It Will Never Happen to Me by [facility]; Outgrowing the Pain by Artis Delay; Healing the Child Within by [facility]); process insights attained. 13. Reduce overall frequency, intensity, and duration of the anxiety so that daily functioning is not impaired. Objective Reestablish a consistent sleep-wake cycle. Target Date: 2023-05-24 Frequency: Biweekly  Progress: 0 Modality: individual  Related Interventions Teach and implement sleep hygiene practices to help the client reestablish a consistent sleep-wake cycle; review, reinforce success, and provide corrective feedback toward improvement. Objective Identify, challenge, and replace biased, fearful self-talk with positive, realistic, and empowering self-talk. Target Date: 2023-05-24 Frequency: Biweekly  Progress: 0 Modality: individual  Related Interventions Explore the client's schema and self-talk that mediate his/her fear response; assist him/her in challenging the biases; replace the distorted messages with reality-based alternatives and positive, realistic self-talk that will increase his/her self-confidence in coping with irrational fears (see Cognitive Therapy of Anxiety Disorders by Alison Stalling). Assign the client a homework exercise in which he/she identifies fearful self-talk, identifies biases in the self-talk, generates alternatives, and tests through behavioral experiments (or assign "Negative Thoughts Trigger Negative Feelings" in the Adult Psychotherapy Homework Planner by [facility]); review and reinforce success, providing corrective feedback toward improvement. Objective Learn and implement problem-solving strategies for  realistically addressing worries. Target Date: 2023-05-24 Frequency: Biweekly  Progress: 0 Modality: individual  Related Interventions Teach the client problem-solving strategies involving specifically defining a problem, generating options for addressing it, evaluating the pros and cons of each option, selecting and implementing an optional action, and reevaluating and refining the action (or assign "Applying Problem-Solving to Interpersonal Conflict" in the Adult Psychotherapy Homework Planner by Bryn Gulling). Objective Learn and implement calming skills to reduce overall anxiety and manage anxiety symptoms. Target Date: 2023-05-24 Frequency: Biweekly  Progress: 0 Modality: individual  Related Interventions Teach the client calming/relaxation skills (e.g., applied relaxation, progressive muscle relaxation, cue controlled relaxation; mindful breathing; biofeedback) and how to discriminate better between relaxation and tension; teach the client how to apply these skills to his/her daily life (e.g., New Directions in Progressive Muscle Relaxation by Casper Harrison, and Hazlett-Stevens; Treating Generalized Anxiety Disorder by Rygh and Amparo Bristol). Assign the client to read about progressive muscle relaxation and other  calming strategies in relevant books or treatment manuals (e.g., Progressive Relaxation Training by Leroy Kennedy; Mastery of Your Anxiety and Worry: Workbook by Beckie Busing). Objective Learn and implement relapse prevention strategies for managing possible future anxiety symptoms. Target Date: 2023-05-24 Frequency: Biweekly  Progress: 0 Modality: individual  Related Interventions Discuss with the client the distinction between a lapse and relapse, associating a lapse with an initial and reversible return of worry, anxiety symptoms, or urges to avoid, and relapse with the decision to continue the fearful and avoidant patterns. Identify and rehearse with the client the  management of future situations or circumstances in which lapses could occur. Instruct the client to routinely use new therapeutic skills (e.g., relaxation, cognitive restructuring, exposure, and problem-solving) in daily life to address emergent worries, anxiety, and avoidant tendencies. Develop a "coping card" on which coping strategies and other important information (e.g., "Breathe deeply and relax," "Challenge unrealistic worries," "Use problem-solving") are written for the client's later use. Objective Identify the major life conflicts from the past and present that form the basis for present anxiety. Target Date: 2023-05-24 Frequency: Biweekly  Progress: 0 Modality: individual  Related Interventions Reinforce the client's insights into the role of his/her past emotional pain and present anxiety. Ask the client to develop and process a list of key past and present life conflicts that continue to cause worry. Assist the client in becoming aware of key unresolved life conflicts and in starting to work toward their resolution. 14. Release the emotions associated with past childhood/family issues, resulting in less resentment and more serenity. 15. Resolve past childhood/family issues, leading to less anger and depression, greater self-esteem, security, and confidence. 16. Resolve the core conflict that is the source of anxiety. 17. Stabilize anxiety level while increasing ability to function on a daily basis.  Diagnosis:Major depressive disorder, recurrent episode, moderate (HCC)  Generalized anxiety disorder  PTSD (post-traumatic stress disorder)  Plan:  -positive affirmations -meet again on Thursday, July 29, 2022 at 1pm in person.

## 2022-07-19 ENCOUNTER — Ambulatory Visit: Payer: BC Managed Care – PPO | Admitting: Professional

## 2022-07-22 ENCOUNTER — Ambulatory Visit (INDEPENDENT_AMBULATORY_CARE_PROVIDER_SITE_OTHER): Payer: BC Managed Care – PPO | Admitting: Family

## 2022-07-22 ENCOUNTER — Encounter: Payer: Self-pay | Admitting: Family

## 2022-07-22 ENCOUNTER — Telehealth: Payer: Self-pay | Admitting: Family

## 2022-07-22 VITALS — BP 120/72 | HR 80 | Temp 98.2°F | Resp 18 | Ht 65.0 in | Wt 112.6 lb

## 2022-07-22 DIAGNOSIS — F411 Generalized anxiety disorder: Secondary | ICD-10-CM

## 2022-07-22 DIAGNOSIS — Z0000 Encounter for general adult medical examination without abnormal findings: Secondary | ICD-10-CM | POA: Diagnosis not present

## 2022-07-22 DIAGNOSIS — R634 Abnormal weight loss: Secondary | ICD-10-CM | POA: Diagnosis not present

## 2022-07-22 DIAGNOSIS — F4321 Adjustment disorder with depressed mood: Secondary | ICD-10-CM

## 2022-07-22 DIAGNOSIS — R0789 Other chest pain: Secondary | ICD-10-CM | POA: Diagnosis not present

## 2022-07-22 DIAGNOSIS — K5900 Constipation, unspecified: Secondary | ICD-10-CM

## 2022-07-22 DIAGNOSIS — F410 Panic disorder [episodic paroxysmal anxiety] without agoraphobia: Secondary | ICD-10-CM

## 2022-07-22 DIAGNOSIS — Z8349 Family history of other endocrine, nutritional and metabolic diseases: Secondary | ICD-10-CM

## 2022-07-22 DIAGNOSIS — F419 Anxiety disorder, unspecified: Secondary | ICD-10-CM | POA: Diagnosis not present

## 2022-07-22 LAB — COMPREHENSIVE METABOLIC PANEL
ALT: 20 U/L (ref 0–35)
AST: 21 U/L (ref 0–37)
Albumin: 4.6 g/dL (ref 3.5–5.2)
Alkaline Phosphatase: 62 U/L (ref 39–117)
BUN: 16 mg/dL (ref 6–23)
CO2: 32 mEq/L (ref 19–32)
Calcium: 9.8 mg/dL (ref 8.4–10.5)
Chloride: 98 mEq/L (ref 96–112)
Creatinine, Ser: 0.95 mg/dL (ref 0.40–1.20)
GFR: 64.9 mL/min (ref >60.00)
Glucose, Bld: 95 mg/dL (ref 70–99)
Potassium: 4.1 mEq/L (ref 3.5–5.1)
Sodium: 135 mEq/L (ref 135–145)
Total Bilirubin: 0.4 mg/dL (ref 0.2–1.2)
Total Protein: 7.2 g/dL (ref 6.0–8.3)

## 2022-07-22 LAB — CBC WITH DIFFERENTIAL/PLATELET
Basophils Absolute: 0 10*3/uL (ref 0.0–0.1)
Basophils Relative: 0.6 % (ref 0.0–3.0)
Eosinophils Absolute: 0.2 10*3/uL (ref 0.0–0.7)
Eosinophils Relative: 3.7 % (ref 0.0–5.0)
HCT: 44 % (ref 36.0–46.0)
Hemoglobin: 14.9 g/dL (ref 12.0–15.0)
Lymphocytes Relative: 32.9 % (ref 12.0–46.0)
Lymphs Abs: 1.4 10*3/uL (ref 0.7–4.0)
MCHC: 33.9 g/dL (ref 30.0–36.0)
MCV: 94.8 fl (ref 78.0–100.0)
Monocytes Absolute: 0.6 10*3/uL (ref 0.1–1.0)
Monocytes Relative: 14.6 % — ABNORMAL HIGH (ref 3.0–12.0)
Neutro Abs: 2 10*3/uL (ref 1.4–7.7)
Neutrophils Relative %: 48.2 % (ref 43.0–77.0)
Platelets: 287 10*3/uL (ref 150.0–400.0)
RBC: 4.64 Mil/uL (ref 3.87–5.11)
RDW: 12.9 % (ref 11.5–15.5)
WBC: 4.2 10*3/uL (ref 4.0–10.5)

## 2022-07-22 LAB — LIPID PANEL
Cholesterol: 177 mg/dL (ref 0–200)
HDL: 89.3 mg/dL (ref >39.00)
LDL Cholesterol: 78 mg/dL (ref 0–99)
NonHDL: 88.13
Total CHOL/HDL Ratio: 2
Triglycerides: 49 mg/dL (ref 0.0–149.0)
VLDL: 9.8 mg/dL (ref 0.0–40.0)

## 2022-07-22 NOTE — Assessment & Plan Note (Signed)
Discussed healthy diet.  Mammo up to date. Will request pap from GYN.  Declines immunizations today as above.

## 2022-07-22 NOTE — Assessment & Plan Note (Signed)
Suspect due to anxiety.  EKG tracing is personally reviewed.  EKG notes NSR.  No acute changes. Compared to EKG on file from 2018. Will refer to cardiology for further evaluation.

## 2022-07-22 NOTE — Assessment & Plan Note (Signed)
Continues linzess for IBS, fair control watching diet. Continue linzess.

## 2022-07-22 NOTE — Assessment & Plan Note (Signed)
She is doing well overall, just stressed about her mother who is in hospice for her dementia.  Continues to work with a Social worker.

## 2022-07-22 NOTE — Telephone Encounter (Signed)
Request faxed

## 2022-07-22 NOTE — Assessment & Plan Note (Signed)
Continues to have significant anxiety. Will update controlled substance contract and UDS today. Continue wellbutrin, buspar and klonopin.

## 2022-07-22 NOTE — Progress Notes (Signed)
Subjective:     Patient ID: Shannon [name], female    DOB: 11-04-1960, 62 y.o.   MRN: [id number]  Chief Complaint  Patient presents with   Annual Exam    HPI  Patient presents today for complete physical.  Immunizations: declines shingrix, declines flu shot and covid.  Diet: needs improvement, feels like she doesn't ave time to eat.  Wt Readings from Last 3 Encounters:  07/22/22 112 lb 9.6 oz (51.1 kg)  04/15/22 113 lb (51.3 kg)  04/12/22 114 lb 8 oz (51.9 kg)  Exercise: trying to go back to swimming, does not have pain with swimming.   Colonoscopy: up to date Pap Smear: will request for GYN Mammogram: 11/15 [facility] Up to date Dental : up to date.   She is working with Francie Massing for therapy.  She is maintained on Wellbutrin and buspar.  Using klonopin TID.   IBS- She is maintained on Linzess.     Adrenal insufficiency- dad had.  She is concerned about a a brown skin spot on the right cheek.  Notes that she has occasions of increased resting heart rat, chest pain, jaw pain.  She reports that she had an episode of 88/60 BP.  Dad had PPM at age 54.   No alcohol x 2 months.    Health Maintenance Due  Topic Date Due   PAP SMEAR-Modifier  02/02/2021    Past Medical History:  Diagnosis Date   Anemia    Bilateral thumb pain 01/12/2018   Cervical spondylosis without myelopathy 02/12/2016   Chronic midline posterior neck pain 06/19/2015   Concussion without loss of consciousness 06/18/2017   Generalized anxiety disorder with panic attacks 12/30/2010   History of eating disorder    anorexia nervosa in her 20's   Hx Migraine headaches    Kidney stones    passed on their own   Low back pain 03/03/2020   Lumbar radicular pain 06/19/2015   Major depressive disorder 12/23/2012   Osteoarthritis of carpometacarpal [facility]) joint of thumb 01/10/2020   Postlaminectomy syndrome, lumbar region 06/19/2015   Raynaud's disease 03/23/2013   Sacroiliac dysfunction 06/19/2015    Spondylosis of lumbar region without myelopathy or radiculopathy 07/15/2015   Stomach disorder    due to complications with cholecystectomy "almost died"    Past Surgical History:  Procedure Laterality Date   APPENDECTOMY  1976   BREAST BIOPSY Right    CHOLECYSTECTOMY  08/2009   reports history of gallbladder polyps, she reports stents in duct of [place]ca    COLONOSCOPY  2022   HYSTEROSCOPY  07/2011   LAMINECTOMY  1994   L4-5   LUMBAR LAMINECTOMY/DECOMPRESSION MICRODISCECTOMY N/A 04/15/2022   Procedure: LUMBAR THREE-FOUR LUMBAR LAMINECTOMY;  Surgeon: Susa Day, MD;  Location: [facility];  Service: Orthopedics;  Laterality: N/A;  2 hrs 3 C-Bed   TONSILLECTOMY  1982   TUBAL LIGATION      Family History  Problem Relation Age of Onset   Hypertension Mother    Memory loss Mother    Dementia Mother    Alcoholism Mother    Diabetes Father    Hypertension Father    Hypothyroidism Father    CAD Father    Stroke Father    Hashimoto's thyroiditis Sister    Hypothyroidism Sister    Neuropathy Sister        chronic inflammatory demyelinating peripheral neuropathy from lyme disease   Alcohol abuse Maternal Grandmother    Cancer Paternal Grandfather  lung cancer   Hypothyroidism Other     Social History   Socioeconomic History   Marital status: Married    Spouse name: Not on file   Number of children: 3   Years of education: 15   Highest education level: Associate degree: academic program  Occupational History    Employer: SELF EMPLOYED  Tobacco Use   Smoking status: Never   Smokeless tobacco: Never  Vaping Use   Vaping Use: Never used  Substance and Sexual Activity   Alcohol use: Yes    Comment: occ.   Drug use: Not Currently   Sexual activity: Yes    Partners: Male    Birth control/protection: Surgical  Other Topics Concern   Not on file  Social History Narrative   Regular exercise:  Yes (swim instructor)   Caffeine Use:  1 daily   Married 3 children ages  34 son, 41 son, and 85 daughter   Associates degree   Right handed          Social Determinants of Health   Financial Resource Strain: Not on file  Food Insecurity: Not on file  Transportation Needs: Not on file  Physical Activity: Not on file  Stress: Not on file  Social Connections: Not on file  Intimate Partner Violence: Not on file    Outpatient Medications Prior to Visit  Medication Sig Dispense Refill   buPROPion (WELLBUTRIN XL) 300 MG 24 hr tablet Take 1 tablet (300 mg total) by mouth daily. 90 tablet 1   busPIRone (BUSPAR) 15 MG tablet TAKE 1 TABLET BY MOUTH TWICE A DAY 180 tablet 1   clonazePAM (KLONOPIN) 0.5 MG tablet TAKE ONE TABLET BY MOUTH THREE TIMES A DAY AS NEEDED FOR ANXIETY 90 tablet 0   Coenzyme Q10 (COQ10) 100 MG CAPS Take 100 mg by mouth daily.     cyanocobalamin (VITAMIN B12) 500 MCG tablet Take 500 mcg by mouth daily.     Gamma-Aminobutyric Acid (GABA PO) Take 500 mg by mouth at bedtime.     lidocaine (LIDODERM) 5 % Place 1 patch onto the skin daily as needed (pain). Remove & Discard patch within 12 hours or as directed by MD     LINZESS 290 MCG CAPS capsule Take 290 mcg by mouth daily.     MAGNESIUM GLYCINATE PO Take 120 mg by mouth at bedtime.     melatonin 3 MG TABS tablet Take 3 mg by mouth at bedtime.     Multiple Vitamin (MULTIVITAMIN) tablet Take 1 tablet by mouth daily.     OVER THE COUNTER MEDICATION Take 1 capsule by mouth daily. Brain Elevate     polyethylene glycol (MIRALAX / GLYCOLAX) 17 g packet Take 17 g by mouth daily. 14 each 0   docusate sodium (COLACE) 100 MG capsule Take 1 capsule (100 mg total) by mouth 2 (two) times daily as needed for mild constipation. 30 capsule 1   oxyCODONE (OXY IR/ROXICODONE) 5 MG immediate release tablet Take 1 tablet (5 mg total) by mouth every 4 (four) hours as needed for severe pain. 40 tablet 0   No facility-administered medications prior to visit.    Allergies  Allergen Reactions   Buprenorphine Hcl      Other reaction(s): Other (See Comments) No relief   Cortisporin [Neomycin-Polymyxin-Hc] Swelling    Ear dropps   Eszopiclone     Headaches  Lunesta*   Flexeril [Cyclobenzaprine Hcl] Other (See Comments)    Mood swings   Gabapentin  sedation   Morphine And Related Other (See Comments)    No relief   Nsaids Other (See Comments)    GI bleed   Paroxetine Nausea Only        Shellfish Allergy Other (See Comments)    skin & mouth tingle   Tizanidine Hcl Other (See Comments)    Dizziness, mood swings   Tolmetin     Other reaction(s): Other (See Comments) GI bleed    Review of Systems  Constitutional:  Positive for weight loss.  HENT:  Negative for congestion and hearing loss.        Hoarseness for allergies  Eyes:  Negative for blurred vision.  Respiratory:  Negative for cough.   Cardiovascular:  Positive for chest pain and palpitations.       Palpitations with panic attacks  Gastrointestinal:  Positive for constipation.  Genitourinary:  Negative for dysuria, frequency and hematuria.  Musculoskeletal:  Positive for back pain (very mild). Negative for joint pain and myalgias.  Skin:  Negative for rash.  Neurological:  Positive for headaches (infrequent- attributes to stress).  Psychiatric/Behavioral:         See HPI       Objective:    Physical Exam  BP 120/72   Pulse 80   Temp 98.2 F (36.8 C)   Resp 18   Ht 5\' 5"  (1.651 m)   Wt 112 lb 9.6 oz (51.1 kg)   LMP 03/19/2017   SpO2 100%   BMI 18.74 kg/m  Wt Readings from Last 3 Encounters:  07/22/22 112 lb 9.6 oz (51.1 kg)  04/15/22 113 lb (51.3 kg)  04/12/22 114 lb 8 oz (51.9 kg)  Physical Exam  Constitutional: She is oriented to person, place, and time. She appears well-developed and well-nourished. No distress.  HENT:  Head: Normocephalic and atraumatic.  Right Ear: Tympanic membrane and ear canal normal.  Left Ear: Tympanic membrane and ear canal normal.  Mouth/Throat: Oropharynx is clear and moist.   Eyes: Pupils are equal, round, and reactive to light. No scleral icterus.  Neck: Normal range of motion. No thyromegaly present.  Cardiovascular: Normal rate and regular rhythm.   No murmur heard. Pulmonary/Chest: Effort normal and breath sounds normal. No respiratory distress. He has no wheezes. She has no rales. She exhibits no tenderness.  Abdominal: Soft. Bowel sounds are normal. She exhibits no distension and no mass. There is no tenderness. There is no rebound and no guarding.  Musculoskeletal: She exhibits no edema.  Lymphadenopathy:    She has no cervical adenopathy.  Neurological: She is alert and oriented to person, place, and time. She has normal patellar reflexes. She exhibits normal muscle tone. Coordination normal.  Skin: Skin is warm and dry.  Psychiatric: She has a normal mood and affect. Her behavior is normal. Judgment and thought content normal.           Assessment & Plan:        Assessment & Plan:   Problem List Items Addressed This Visit       Unprioritized   Preventative health care - Primary    Discussed healthy diet.  Mammo up to date. Will request pap from GYN.  Declines immunizations today as above.       Relevant Orders   Lipid panel   Grief reaction    She is doing well overall, just stressed about her mother who is in hospice for her dementia.  Continues to work with a Social worker.  Generalized anxiety disorder with panic attacks    Continues to have significant anxiety. Will update controlled substance contract and UDS today. Continue wellbutrin, buspar and klonopin.       Constipation    Continues linzess for IBS, fair control watching diet. Continue linzess.       Atypical chest pain    Suspect due to anxiety.  EKG tracing is personally reviewed.  EKG notes NSR.  No acute changes. Compared to EKG on file from 2018. Will refer to cardiology for further evaluation.        Relevant Orders   EKG 12-Lead (Completed)   Ambulatory  referral to Cardiology   Other Visit Diagnoses     Weight loss       Relevant Orders   Comp Met (CMET)   CBC w/Diff   TSH   Anxiety       Relevant Orders   DRUG MONITORING, PANEL 8 WITH CONFIRMATION, URINE   Family history of adrenal insufficiency       Relevant Orders   Cortisol       I have discontinued Rindi Edgecombe's docusate sodium and oxyCODONE. I am also having her maintain her Linzess, melatonin, Gamma-Aminobutyric Acid (GABA PO), MAGNESIUM GLYCINATE PO, multivitamin, OVER THE COUNTER MEDICATION, CoQ10, cyanocobalamin, lidocaine, polyethylene glycol, busPIRone, clonazePAM, and buPROPion.  No orders of the defined types were placed in this encounter.

## 2022-07-22 NOTE — Telephone Encounter (Signed)
Please call [facility] and request copy of last pap.

## 2022-07-23 LAB — DRUG MONITORING, PANEL 8 WITH CONFIRMATION, URINE
6 Acetylmorphine: NEGATIVE ng/mL (ref <10)
Alcohol Metabolites: NEGATIVE ng/mL (ref <500)
Amphetamines: NEGATIVE ng/mL (ref <500)
Benzodiazepines: NEGATIVE ng/mL (ref <100)
Buprenorphine, Urine: NEGATIVE ng/mL (ref <5)
Cocaine Metabolite: NEGATIVE ng/mL (ref <150)
Creatinine: 41.8 mg/dL (ref >=20.0)
MDMA: NEGATIVE ng/mL (ref <500)
Marijuana Metabolite: NEGATIVE ng/mL (ref <20)
Opiates: NEGATIVE ng/mL (ref <100)
Oxidant: NEGATIVE ug/mL (ref <200)
Oxycodone: NEGATIVE ng/mL (ref <100)
pH: 7.1 (ref 4.5–9.0)

## 2022-07-23 LAB — CORTISOL: Cortisol, Plasma: 11.8 ug/dL

## 2022-07-23 LAB — TSH: TSH: 1.38 u[IU]/mL (ref 0.35–5.50)

## 2022-07-23 LAB — DM TEMPLATE

## 2022-07-26 ENCOUNTER — Telehealth: Payer: Self-pay | Admitting: Family

## 2022-07-26 DIAGNOSIS — Z79899 Other long term (current) drug therapy: Secondary | ICD-10-CM

## 2022-07-26 NOTE — Telephone Encounter (Signed)
Please advise pt that I would like to bring her back for a blood test as her urine drug screening did not give me the information I need to continue to prescribe her klonopin.

## 2022-07-27 NOTE — Telephone Encounter (Signed)
Patient scheduled for tomorrow am

## 2022-07-28 ENCOUNTER — Encounter: Payer: Self-pay | Admitting: Family

## 2022-07-28 ENCOUNTER — Telehealth: Payer: Self-pay | Admitting: Family

## 2022-07-28 ENCOUNTER — Other Ambulatory Visit (INDEPENDENT_AMBULATORY_CARE_PROVIDER_SITE_OTHER): Payer: BC Managed Care – PPO

## 2022-07-28 ENCOUNTER — Other Ambulatory Visit: Payer: Self-pay | Admitting: Family

## 2022-07-28 ENCOUNTER — Encounter (INDEPENDENT_AMBULATORY_CARE_PROVIDER_SITE_OTHER): Payer: Self-pay

## 2022-07-28 DIAGNOSIS — Z79899 Other long term (current) drug therapy: Secondary | ICD-10-CM | POA: Diagnosis not present

## 2022-07-28 NOTE — Telephone Encounter (Signed)
Pt questions were addressed via mychart.

## 2022-07-28 NOTE — Telephone Encounter (Signed)
Pt came in office stating would like to speak with provider about her last lab work that came back negative (DM template)- pt is wanting to know why her results were negative also pt is wanting to know if she can get one of her rx name brand and not generic med since she states is not working. Please advise pt tel [phone]

## 2022-07-29 ENCOUNTER — Encounter: Payer: Self-pay | Admitting: Professional

## 2022-07-29 ENCOUNTER — Ambulatory Visit: Payer: BC Managed Care – PPO | Admitting: Professional

## 2022-07-29 DIAGNOSIS — F331 Major depressive disorder, recurrent, moderate: Secondary | ICD-10-CM

## 2022-07-29 DIAGNOSIS — F4310 Post-traumatic stress disorder, unspecified: Secondary | ICD-10-CM

## 2022-07-29 DIAGNOSIS — F411 Generalized anxiety disorder: Secondary | ICD-10-CM | POA: Diagnosis not present

## 2022-07-29 NOTE — Progress Notes (Signed)
[facility] Counselor/Therapist Progress Note  Patient ID: Shannon [name], MRN: [id number],    Date: 07/29/2022  Time Spent: 56 minutes [id number]   Treatment Type: Individual Therapy  Risk Assessment: Danger to Self:  No Self-injurious Behavior: No Danger to Others: No  Subjective: The patient arrived on time for her in-person session.   Issues addressed: 1-mother a-is appearing to decline in memory and appetite b-receiving call from nursing home with away on beach trip and was anxious the remainder of the time c-pt returned a day early from vacation and went to see her mother d-pt's mother is hallucinating, calling out for her parents who she sees in the hallway -at one point pt had to deal with her mother not realizing who she was  -pt opted to ask her mother if shc could help since she could not locate Shannon [name] -pt tearful as she shared that the painters are coming next week to her parents and then the house will be staged for sale -pt struggling with feelings of loss, especially related to father's death and loss of house -pt knows her mother is in a great deal of pain and wants for her to pass so that she is no longer suffering 2-children -pt shared details related to strained relationship between son Shannon [name] and daughter Shannon [name] -issues with them have occurred for years but Shannon [name]'s history of drug addiction greatly impacted Shannon [name] -she has attempted to talk with him but he refuses to engage and even at their maternal grandfather's funeral he refused to even speak -pt does not like her children to be estranged but has not successfully communicated her feelings -when she attempted to discuss with Shannon [name] he commented that he knew she would bring that up -pt commented that it it were not for his daughter Shannon [name] he would not have a relationship with him  Treatment Plan Problems Addressed  Anxiety, Childhood Trauma, Low Self-Esteem, Unipolar  Depression Goals 1. Alleviate depressive symptoms and return to previous level of effective functioning. 2. Appropriately grieve the loss in order to normalize mood and to return to previously adaptive level of functioning. Objective Learn and implement behavioral strategies to overcome depression. Target Date: 2023-05-24 Frequency: Biweekly  Progress: 0 Modality: individual  Related Interventions Engage the client in "behavioral activation," increasing his/her activity level and contact with sources of reward, while identifying processes that inhibit activation (see Behavioral Activation for Depression by Beverly Gust, Dimidjian, and Herman-Dunn; or assign "Identify and Schedule Pleasant Activities" in the Adult Psychotherapy Homework Planner by [facility]); use behavioral techniques such as instruction, rehearsal, role-playing, role reversal, as needed, to facilitate activity in the client's daily life; reinforce success. Assist the client in developing skills that increase the likelihood of deriving pleasure from behavioral activation (e.g., assertiveness skills, developing an exercise plan, less internal/more external focus, increased social involvement); reinforce success. Objective Learn and implement conflict resolution skills to resolve interpersonal problems. Target Date: 2023-05-24 Frequency: Biweekly  Progress: 0 Modality: individual  Related Interventions Teach conflict resolution skills (e.g., empathy, active listening, "I messages," respectful communication, assertiveness without aggression, compromise); use psychoeducation, modeling, role-playing, and rehearsal to work through several current conflicts; assign homework exercises; review and repeat so as to integrate their use into the client's life. Objective Identify important people in life, past and present, and describe the quality, good and poor, of those relationships. Target Date: 2023-05-24 Frequency: Biweekly  Progress: 0  Modality: individual  Objective Learn and implement relapse prevention skills. Target Date: 2023-05-24 Frequency: Biweekly  Progress: 0 Modality:  individual  Related Interventions Identify and rehearse with the client the management of future situations or circumstances in which lapses could occur. Objective Verbalize an understanding of healthy and unhealthy emotions with the intent of increasing the use of healthy emotions to guide actions. Target Date: 2023-05-24 Frequency: Biweekly  Progress: 0 Modality: individual  Related Interventions Use a process-experiential approach consistent with Emotion-Focused Therapy to create a safe, nurturing environment in which the client can process emotions, learning to identify and regulate unhealthy feelings and to generate more adaptive ones that then guide actions (see Emotion-Focused Therapy for Depression by Andrey Spearman). 3. Demonstrate improved self-esteem through more pride in appearance, more assertiveness, greater eye contact, and identification of positive traits in self-talk messages. 4. Develop an awareness of how childhood issues have affected and continue to affect one's family life. 5. Develop healthy interpersonal relationships that lead to the alleviation and help prevent the relapse of depression. 6. Develop healthy thinking patterns and beliefs about self, others, and the world that lead to the alleviation and help prevent the relapse of depression. 7. Elevate self-esteem. Objective Demonstrate an increased ability to identify and express personal feelings. Target Date: 2023-05-24 Frequency: Biweekly  Progress: 0 Modality: individual  Related Interventions Assist the client in identifying and labeling emotions. Objective Acknowledge feeling less competent than most others. Target Date: 2023-05-24 Frequency: Biweekly  Progress: 0 Modality: individual  Related Interventions Explore the client's assessment of  himself/herself and what is verbalized as the basis for negative self-perception. Actively build the level of trust with the client in individual sessions through consistent eye contact, active listening, unconditional positive regard, and warm acceptance to help increase his/her ability to identify and express feelings. Objective Decrease the frequency of negative self-descriptive statements and increase frequency of positive self-descriptive statements. Target Date: 2023-05-24 Frequency: Biweekly  Progress: 0 Modality: individual  Related Interventions Assist the client in becoming aware of how he/she expresses or acts out negative feelings about himself/herself. Help the client reframe his/her negative assessment of himself/herself. Assist the client in developing positive self-talk as a way of boosting his/her confidence and self-image (or assign "Positive Self-Talk" in the Adult Psychotherapy Homework Planner by Bryn Gulling). Objective Identify and engage in activities that would improve self-image by being consistent with one's values. Target Date: 2023-05-24 Frequency: Biweekly  Progress: 0 Modality: individual  Related Interventions Help the client analyze his/her values and the congruence or incongruence between them and the client's daily activities. Identify and assign activities congruent with the client's values; process them toward improving self-concept and self-esteem. Objective Identify positive traits and talents about self. Target Date: 2023-05-24 Frequency: Biweekly  Progress: 0 Modality: individual  Related Interventions Assign the client the exercise of identifying his/her positive physical characteristics in a mirror to help him/her become more comfortable with himself/herself. Ask the client to keep building a list of positive traits and have him/her read the list at the beginning and end of each session (or assign "Acknowledging My Strengths" or "What Are My Good  Qualities?" in the Adult Psychotherapy Homework Planner by [facility]); reinforce the client's positive self-descriptive statements. 8. Enhance ability to effectively cope with the full variety of life's worries and anxieties. 9. Establish an inward sense of self-worth, confidence, and competence. 10. Interact socially without undue distress or disability. 11. Learn and implement coping skills that result in a reduction of anxiety and worry, and improved daily functioning. 12. Let go of blame and begin to forgive others for pain caused in childhood. Objective Describe  what it was like to grow up in the home environment. Target Date: 2023-05-24 Frequency: Biweekly  Progress: 0 Modality: individual  Related Interventions Develop the client's family genogram and/or symptom line and help identify patterns of dysfunction within the family. Objective Increase level of trust of others as shown by more socialization and greater intimacy tolerance. Target Date: 2023-05-24 Frequency: Biweekly  Progress: 0 Modality: individual  Related Interventions Teach the client the share-check method of building trust in relationships (sharing a little information and checking as to the recipient's sensitivity in reacting to that information). Teach the client the advantages of treating people as trustworthy given a reasonable amount of time to assess their character. Objective Describe each family member and identify the role each played within the family. Target Date: 2023-05-24 Frequency: Biweekly  Progress: 0 Modality: individual  Related Interventions Assist the client in clarifying his/her role within the family and his/her feelings connected to that role. Objective Identify feelings associated with major traumatic incidents in childhood and with parental child-rearing patterns. Target Date: 2023-05-24 Frequency: Biweekly  Progress: 0 Modality: individual  Related Interventions Support and encourage the  client when he/she begins to express feelings of rage, sadness, fear, and rejection relating to family abuse or neglect. Assign the client to record feelings in a journal that describes memories, behavior, and emotions tied to his/her traumatic childhood experiences (or assign "How the Trauma Affects Me" in the Adult Psychotherapy Homework Planner by Bryn Gulling). Ask the client to read books on the emotional effects of neglect and abuse in childhood (e.g., It Will Never Happen to Me by [facility]; Outgrowing the Pain by Artis Delay; Healing the Child Within by [facility]); process insights attained. 13. Reduce overall frequency, intensity, and duration of the anxiety so that daily functioning is not impaired. Objective Reestablish a consistent sleep-wake cycle. Target Date: 2023-05-24 Frequency: Biweekly  Progress: 0 Modality: individual  Related Interventions Teach and implement sleep hygiene practices to help the client reestablish a consistent sleep-wake cycle; review, reinforce success, and provide corrective feedback toward improvement. Objective Identify, challenge, and replace biased, fearful self-talk with positive, realistic, and empowering self-talk. Target Date: 2023-05-24 Frequency: Biweekly  Progress: 0 Modality: individual  Related Interventions Explore the client's schema and self-talk that mediate his/her fear response; assist him/her in challenging the biases; replace the distorted messages with reality-based alternatives and positive, realistic self-talk that will increase his/her self-confidence in coping with irrational fears (see Cognitive Therapy of Anxiety Disorders by Alison Stalling). Assign the client a homework exercise in which he/she identifies fearful self-talk, identifies biases in the self-talk, generates alternatives, and tests through behavioral experiments (or assign "Negative Thoughts Trigger Negative Feelings" in the Adult Psychotherapy Homework Planner by [facility]); review and  reinforce success, providing corrective feedback toward improvement. Objective Learn and implement problem-solving strategies for realistically addressing worries. Target Date: 2023-05-24 Frequency: Biweekly  Progress: 0 Modality: individual  Related Interventions Teach the client problem-solving strategies involving specifically defining a problem, generating options for addressing it, evaluating the pros and cons of each option, selecting and implementing an optional action, and reevaluating and refining the action (or assign "Applying Problem-Solving to Interpersonal Conflict" in the Adult Psychotherapy Homework Planner by Bryn Gulling). Objective Learn and implement calming skills to reduce overall anxiety and manage anxiety symptoms. Target Date: 2023-05-24 Frequency: Biweekly  Progress: 0 Modality: individual  Related Interventions Teach the client calming/relaxation skills (e.g., applied relaxation, progressive muscle relaxation, cue controlled relaxation; mindful breathing; biofeedback) and how to discriminate better between relaxation and tension; teach the  client how to apply these skills to his/her daily life (e.g., New Directions in Progressive Muscle Relaxation by Casper Harrison, and Hazlett-Stevens; Treating Generalized Anxiety Disorder by Rygh and Amparo Bristol). Assign the client to read about progressive muscle relaxation and other calming strategies in relevant books or treatment manuals (e.g., Progressive Relaxation Training by Gwynneth Aliment and Dani Gobble; Mastery of Your Anxiety and Worry: Workbook by Beckie Busing). Objective Learn and implement relapse prevention strategies for managing possible future anxiety symptoms. Target Date: 2023-05-24 Frequency: Biweekly  Progress: 0 Modality: individual  Related Interventions Discuss with the client the distinction between a lapse and relapse, associating a lapse with an initial and reversible return of worry, anxiety symptoms, or urges  to avoid, and relapse with the decision to continue the fearful and avoidant patterns. Identify and rehearse with the client the management of future situations or circumstances in which lapses could occur. Instruct the client to routinely use new therapeutic skills (e.g., relaxation, cognitive restructuring, exposure, and problem-solving) in daily life to address emergent worries, anxiety, and avoidant tendencies. Develop a "coping card" on which coping strategies and other important information (e.g., "Breathe deeply and relax," "Challenge unrealistic worries," "Use problem-solving") are written for the client's later use. Objective Identify the major life conflicts from the past and present that form the basis for present anxiety. Target Date: 2023-05-24 Frequency: Biweekly  Progress: 0 Modality: individual  Related Interventions Reinforce the client's insights into the role of his/her past emotional pain and present anxiety. Ask the client to develop and process a list of [name] past and present life conflicts that continue to cause worry. Assist the client in becoming aware of [name] unresolved life conflicts and in starting to work toward their resolution. 14. Release the emotions associated with past childhood/family issues, resulting in less resentment and more serenity. 15. Resolve past childhood/family issues, leading to less anger and depression, greater self-esteem, security, and confidence. 16. Resolve the core conflict that is the source of anxiety. 17. Stabilize anxiety level while increasing ability to function on a daily basis.  Diagnosis:Major depressive disorder, recurrent episode, moderate (HCC)  Generalized anxiety disorder  PTSD (post-traumatic stress disorder)  Plan:  -positive affirmations -meet again on Thursday, August 05, 2022 at 1pm in person.

## 2022-08-03 LAB — CLONAZEPAM LEVEL: Clonazepam Lvl: 19 mcg/L — ABNORMAL LOW (ref 30–60)

## 2022-08-05 ENCOUNTER — Encounter: Payer: Self-pay | Admitting: Professional

## 2022-08-05 ENCOUNTER — Ambulatory Visit: Payer: BC Managed Care – PPO | Admitting: Professional

## 2022-08-05 DIAGNOSIS — F411 Generalized anxiety disorder: Secondary | ICD-10-CM

## 2022-08-05 DIAGNOSIS — F331 Major depressive disorder, recurrent, moderate: Secondary | ICD-10-CM | POA: Diagnosis not present

## 2022-08-05 DIAGNOSIS — F4310 Post-traumatic stress disorder, unspecified: Secondary | ICD-10-CM | POA: Diagnosis not present

## 2022-08-05 NOTE — Progress Notes (Signed)
[place] Counselor/Therapist Progress Note  Patient ID: Shannon [name], MRN: [id number],    Date: 08/05/2022  Time Spent: 56 minutes [id number]   Treatment Type: Individual Therapy  Risk Assessment: Danger to Self:  No Self-injurious Behavior: No Danger to Others: No  Subjective: The patient arrived on time for her in-person session.   Issues addressed: 1-mother a-continues to decline in her health with decreased speech and limited swallowing abilities -pt continues to support her mother with ongoing visits and spent Easter morning attending a service with her at the nursing home -pt took her to lunch but she was minimally engaged -pt allowing herself to go to the beach for the weekend for self care   -discussed use of mini-vacations when she cannot be at the beach 2-sale of parent's house -pt continues to grieve the loss of her parent's home -pt reports she and [name] had some disagreements over painting the two bedrooms -pt is ready for the house to be off of her plate 3-sister [name] -pt has noticed a difference in the tone of interactions with her sister -[name]'s emails are negative and that is not her regular tone -pt thinks [name]'s husband Francee Piccolo has been influencing her   -he is not happy that the home sale is not going through his real estate employer -keeping relationship with [name] and not allowing Roger to interfere -pt has some feelings about Francee Piccolo due to his control over [name] and his past cheating behavior  Treatment Plan Problems Addressed  Anxiety, Childhood Trauma, Low Self-Esteem, Unipolar Depression Goals 1. Alleviate depressive symptoms and return to previous level of effective functioning. 2. Appropriately grieve the loss in order to normalize mood and to return to previously adaptive level of functioning. Objective Learn and implement behavioral strategies to overcome depression. Target Date: 2023-05-24 Frequency: Biweekly  Progress: 0 Modality:  individual  Related Interventions Engage the client in "behavioral activation," increasing his/her activity level and contact with sources of reward, while identifying processes that inhibit activation (see Behavioral Activation for Depression by Beverly Gust, Dimidjian, and Herman-Dunn; or assign "Identify and Schedule Pleasant Activities" in the Adult Psychotherapy Homework Planner by [facility]); use behavioral techniques such as instruction, rehearsal, role-playing, role reversal, as needed, to facilitate activity in the client's daily life; reinforce success. Assist the client in developing skills that increase the likelihood of deriving pleasure from behavioral activation (e.g., assertiveness skills, developing an exercise plan, less internal/more external focus, increased social involvement); reinforce success. Objective Learn and implement conflict resolution skills to resolve interpersonal problems. Target Date: 2023-05-24 Frequency: Biweekly  Progress: 0 Modality: individual  Related Interventions Teach conflict resolution skills (e.g., empathy, active listening, "I messages," respectful communication, assertiveness without aggression, compromise); use psychoeducation, modeling, role-playing, and rehearsal to work through several current conflicts; assign homework exercises; review and repeat so as to integrate their use into the client's life. Objective Identify important people in life, past and present, and describe the quality, good and poor, of those relationships. Target Date: 2023-05-24 Frequency: Biweekly  Progress: 0 Modality: individual  Objective Learn and implement relapse prevention skills. Target Date: 2023-05-24 Frequency: Biweekly  Progress: 0 Modality: individual  Related Interventions Identify and rehearse with the client the management of future situations or circumstances in which lapses could occur. Objective Verbalize an understanding of healthy and unhealthy emotions  with the intent of increasing the use of healthy emotions to guide actions. Target Date: 2023-05-24 Frequency: Biweekly  Progress: 0 Modality: individual  Related Interventions Use a process-experiential approach consistent with Emotion-Focused  Therapy to create a safe, nurturing environment in which the client can process emotions, learning to identify and regulate unhealthy feelings and to generate more adaptive ones that then guide actions (see Emotion-Focused Therapy for Depression by Andrey Spearman). 3. Demonstrate improved self-esteem through more pride in appearance, more assertiveness, greater eye contact, and identification of positive traits in self-talk messages. 4. Develop an awareness of how childhood issues have affected and continue to affect one's family life. 5. Develop healthy interpersonal relationships that lead to the alleviation and help prevent the relapse of depression. 6. Develop healthy thinking patterns and beliefs about self, others, and the world that lead to the alleviation and help prevent the relapse of depression. 7. Elevate self-esteem. Objective Demonstrate an increased ability to identify and express personal feelings. Target Date: 2023-05-24 Frequency: Biweekly  Progress: 0 Modality: individual  Related Interventions Assist the client in identifying and labeling emotions. Objective Acknowledge feeling less competent than most others. Target Date: 2023-05-24 Frequency: Biweekly  Progress: 0 Modality: individual  Related Interventions Explore the client's assessment of himself/herself and what is verbalized as the basis for negative self-perception. Actively build the level of trust with the client in individual sessions through consistent eye contact, active listening, unconditional positive regard, and warm acceptance to help increase his/her ability to identify and express feelings. Objective Decrease the frequency of negative self-descriptive  statements and increase frequency of positive self-descriptive statements. Target Date: 2023-05-24 Frequency: Biweekly  Progress: 0 Modality: individual  Related Interventions Assist the client in becoming aware of how he/she expresses or acts out negative feelings about himself/herself. Help the client reframe his/her negative assessment of himself/herself. Assist the client in developing positive self-talk as a way of boosting his/her confidence and self-image (or assign "Positive Self-Talk" in the Adult Psychotherapy Homework Planner by Bryn Gulling). Objective Identify and engage in activities that would improve self-image by being consistent with one's values. Target Date: 2023-05-24 Frequency: Biweekly  Progress: 0 Modality: individual  Related Interventions Help the client analyze his/her values and the congruence or incongruence between them and the client's daily activities. Identify and assign activities congruent with the client's values; process them toward improving self-concept and self-esteem. Objective Identify positive traits and talents about self. Target Date: 2023-05-24 Frequency: Biweekly  Progress: 0 Modality: individual  Related Interventions Assign the client the exercise of identifying his/her positive physical characteristics in a mirror to help him/her become more comfortable with himself/herself. Ask the client to keep building a list of positive traits and have him/her read the list at the beginning and end of each session (or assign "Acknowledging My Strengths" or "What Are My Good Qualities?" in the Adult Psychotherapy Homework Planner by [facility]); reinforce the client's positive self-descriptive statements. 8. Enhance ability to effectively cope with the full variety of life's worries and anxieties. 9. Establish an inward sense of self-worth, confidence, and competence. 10. Interact socially without undue distress or disability. 11. Learn and implement coping skills  that result in a reduction of anxiety and worry, and improved daily functioning. 12. Let go of blame and begin to forgive others for pain caused in childhood. Objective Describe what it was like to grow up in the home environment. Target Date: 2023-05-24 Frequency: Biweekly  Progress: 0 Modality: individual  Related Interventions Develop the client's family genogram and/or symptom line and help identify patterns of dysfunction within the family. Objective Increase level of trust of others as shown by more socialization and greater intimacy tolerance. Target Date: 2023-05-24 Frequency: Biweekly  Progress:  0 Modality: individual  Related Interventions Teach the client the share-check method of building trust in relationships (sharing a little information and checking as to the recipient's sensitivity in reacting to that information). Teach the client the advantages of treating people as trustworthy given a reasonable amount of time to assess their character. Objective Describe each family member and identify the role each played within the family. Target Date: 2023-05-24 Frequency: Biweekly  Progress: 0 Modality: individual  Related Interventions Assist the client in clarifying his/her role within the family and his/her feelings connected to that role. Objective Identify feelings associated with major traumatic incidents in childhood and with parental child-rearing patterns. Target Date: 2023-05-24 Frequency: Biweekly  Progress: 0 Modality: individual  Related Interventions Support and encourage the client when he/she begins to express feelings of rage, sadness, fear, and rejection relating to family abuse or neglect. Assign the client to record feelings in a journal that describes memories, behavior, and emotions tied to his/her traumatic childhood experiences (or assign "How the Trauma Affects Me" in the Adult Psychotherapy Homework Planner by Bryn Gulling). Ask the client to read books on the  emotional effects of neglect and abuse in childhood (e.g., It Will Never Happen to Me by [facility]; Outgrowing the Pain by Artis Delay; Healing the Child Within by [facility]); process insights attained. 13. Reduce overall frequency, intensity, and duration of the anxiety so that daily functioning is not impaired. Objective Reestablish a consistent sleep-wake cycle. Target Date: 2023-05-24 Frequency: Biweekly  Progress: 0 Modality: individual  Related Interventions Teach and implement sleep hygiene practices to help the client reestablish a consistent sleep-wake cycle; review, reinforce success, and provide corrective feedback toward improvement. Objective Identify, challenge, and replace biased, fearful self-talk with positive, realistic, and empowering self-talk. Target Date: 2023-05-24 Frequency: Biweekly  Progress: 0 Modality: individual  Related Interventions Explore the client's schema and self-talk that mediate his/her fear response; assist him/her in challenging the biases; replace the distorted messages with reality-based alternatives and positive, realistic self-talk that will increase his/her self-confidence in coping with irrational fears (see Cognitive Therapy of Anxiety Disorders by Alison Stalling). Assign the client a homework exercise in which he/she identifies fearful self-talk, identifies biases in the self-talk, generates alternatives, and tests through behavioral experiments (or assign "Negative Thoughts Trigger Negative Feelings" in the Adult Psychotherapy Homework Planner by [facility]); review and reinforce success, providing corrective feedback toward improvement. Objective Learn and implement problem-solving strategies for realistically addressing worries. Target Date: 2023-05-24 Frequency: Biweekly  Progress: 0 Modality: individual  Related Interventions Teach the client problem-solving strategies involving specifically defining a problem, generating options for addressing it,  evaluating the pros and cons of each option, selecting and implementing an optional action, and reevaluating and refining the action (or assign "Applying Problem-Solving to Interpersonal Conflict" in the Adult Psychotherapy Homework Planner by Bryn Gulling). Objective Learn and implement calming skills to reduce overall anxiety and manage anxiety symptoms. Target Date: 2023-05-24 Frequency: Biweekly  Progress: 0 Modality: individual  Related Interventions Teach the client calming/relaxation skills (e.g., applied relaxation, progressive muscle relaxation, cue controlled relaxation; mindful breathing; biofeedback) and how to discriminate better between relaxation and tension; teach the client how to apply these skills to his/her daily life (e.g., New Directions in Progressive Muscle Relaxation by Casper Harrison, and Hazlett-Stevens; Treating Generalized Anxiety Disorder by Rygh and Amparo Bristol). Assign the client to read about progressive muscle relaxation and other calming strategies in relevant books or treatment manuals (e.g., Progressive Relaxation Training by Gwynneth Aliment and Dani Gobble; Mastery of Your Anxiety and Worry: Workbook  by Beckie Busing). Objective Learn and implement relapse prevention strategies for managing possible future anxiety symptoms. Target Date: 2023-05-24 Frequency: Biweekly  Progress: 0 Modality: individual  Related Interventions Discuss with the client the distinction between a lapse and relapse, associating a lapse with an initial and reversible return of worry, anxiety symptoms, or urges to avoid, and relapse with the decision to continue the fearful and avoidant patterns. Identify and rehearse with the client the management of future situations or circumstances in which lapses could occur. Instruct the client to routinely use new therapeutic skills (e.g., relaxation, cognitive restructuring, exposure, and problem-solving) in daily life to address emergent worries, anxiety,  and avoidant tendencies. Develop a "coping card" on which coping strategies and other important information (e.g., "Breathe deeply and relax," "Challenge unrealistic worries," "Use problem-solving") are written for the client's later use. Objective Identify the major life conflicts from the past and present that form the basis for present anxiety. Target Date: 2023-05-24 Frequency: Biweekly  Progress: 0 Modality: individual  Related Interventions Reinforce the client's insights into the role of his/her past emotional pain and present anxiety. Ask the client to develop and process a list of key past and present life conflicts that continue to cause worry. Assist the client in becoming aware of key unresolved life conflicts and in starting to work toward their resolution. 14. Release the emotions associated with past childhood/family issues, resulting in less resentment and more serenity. 15. Resolve past childhood/family issues, leading to less anger and depression, greater self-esteem, security, and confidence. 16. Resolve the core conflict that is the source of anxiety. 17. Stabilize anxiety level while increasing ability to function on a daily basis.  Diagnosis:Major depressive disorder, recurrent episode, moderate  Generalized anxiety disorder  PTSD (post-traumatic stress disorder)  Plan:  -meet again on Thursday, August 12, 2022 at 1pm in person.

## 2022-08-12 ENCOUNTER — Ambulatory Visit (INDEPENDENT_AMBULATORY_CARE_PROVIDER_SITE_OTHER): Payer: BC Managed Care – PPO | Admitting: Professional

## 2022-08-12 ENCOUNTER — Encounter: Payer: Self-pay | Admitting: Professional

## 2022-08-12 DIAGNOSIS — F4310 Post-traumatic stress disorder, unspecified: Secondary | ICD-10-CM

## 2022-08-12 DIAGNOSIS — F411 Generalized anxiety disorder: Secondary | ICD-10-CM

## 2022-08-12 DIAGNOSIS — F331 Major depressive disorder, recurrent, moderate: Secondary | ICD-10-CM

## 2022-08-12 NOTE — Progress Notes (Signed)
[facility] Counselor/Therapist Progress Note  Patient ID: Shannon [name], MRN: [id number],    Date: 08/12/2022  Time Spent: 48 minutes [id number]   Treatment Type: Individual Therapy  Risk Assessment: Danger to Self:  No Self-injurious Behavior: No Danger to Others: No  Subjective: This session was held via video teletherapy. The patient consented to video teletherapy and was located at her home during this session. She is aware it is the responsibility of the patient to secure confidentiality on her end of the session. The provider was in a private home office for the duration of this session.   The patient arrived late for her Caregility session due to connectivity issues.   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 -pt thought being away would be easier b-she was feeling heaviness in her chest and normally loves being on the beach by herself -pt tried focusing on the positives helped a little to keep telling herself of the great times nad memories -"it's still harder than I thought" -pt has to go to closing even though she is not power of attorney due to [place] real estate law -pt stated she knows she'll be fine c-drove home from beach and thought she was could finalize cleaning out the kitchen cabinets -she cannot enter until after the walk through d-[name] always gets angry with her when something goes wrong -went to zoo on Saturday with [name] and [name] -[name] refused to do the rope course and when she questioned her recent back surgery   -he said that she wanted to do it   -they went out to lunch afterward and was in a mood     -didn't like the service, dropped salsa on his shorts, threw his arms up and stopped talking     -[name] asked if he was mad and she could see that [name] was nervous the way she feels     -he communicated well to [facility] but did not treat  her well -pt saw that [name] had a on no daddy's mad   -pt worried about his mood and how he treated [name]   -he tried to call mom the day after and she didn't answer the phone   -[name] called yesterday and told him he had to get his painting supplies out of grandparent's garage   -[name] commented that mom was sad and upset, having a hard time     -he didn't care about how hard it was for her; he only wanted to know how the sale of the house was going to effect him   -if not for [name] she would not put up with this anymore; when he is not with her she doesn't answer his calls -[name] was "all over me" at the funeral   -she thinks it was that he didn't want to deal with anyone else there   -[name] made obligatory contact with some people -[name] has empathy for [name] and care very deeply and loves her, she is his life    Treatment Plan Problems Addressed  Anxiety, Childhood Trauma, Low Self-Esteem, Unipolar Depression Goals 1. Alleviate depressive symptoms and return to previous level of effective functioning. 2. Appropriately grieve the loss in order to normalize mood and to return to previously adaptive level of functioning. Objective Learn and implement behavioral strategies to overcome depression. Target Date: 2023-05-24 Frequency: Biweekly  Progress: 0 Modality: individual  Related Interventions Engage the client  in "behavioral activation," increasing his/her activity level and contact with sources of reward, while identifying processes that inhibit activation (see Behavioral Activation for Depression by Katharine LookMartell, Dimidjian, and Herman-Dunn; or assign "Identify and Schedule Pleasant Activities" in the Adult Psychotherapy Homework Planner by [facility]); use behavioral techniques such as instruction, rehearsal, role-playing, role reversal, as needed, to facilitate activity in the client's daily life; reinforce success. Assist the client in developing skills that increase the likelihood of  deriving pleasure from behavioral activation (e.g., assertiveness skills, developing an exercise plan, less internal/more external focus, increased social involvement); reinforce success. Objective Learn and implement conflict resolution skills to resolve interpersonal problems. Target Date: 2023-05-24 Frequency: Biweekly  Progress: 0 Modality: individual  Related Interventions Teach conflict resolution skills (e.g., empathy, active listening, "I messages," respectful communication, assertiveness without aggression, compromise); use psychoeducation, modeling, role-playing, and rehearsal to work through several current conflicts; assign homework exercises; review and repeat so as to integrate their use into the client's life. Objective Identify important people in life, past and present, and describe the quality, good and poor, of those relationships. Target Date: 2023-05-24 Frequency: Biweekly  Progress: 0 Modality: individual  Objective Learn and implement relapse prevention skills. Target Date: 2023-05-24 Frequency: Biweekly  Progress: 0 Modality: individual  Related Interventions Identify and rehearse with the client the management of future situations or circumstances in which lapses could occur. Objective Verbalize an understanding of healthy and unhealthy emotions with the intent of increasing the use of healthy emotions to guide actions. Target Date: 2023-05-24 Frequency: Biweekly  Progress: 0 Modality: individual  Related Interventions Use a process-experiential approach consistent with Emotion-Focused Therapy to create a safe, nurturing environment in which the client can process emotions, learning to identify and regulate unhealthy feelings and to generate more adaptive ones that then guide actions (see Emotion-Focused Therapy for Depression by Peter MiniumGreenberg and Watson). 3. Demonstrate improved self-esteem through more pride in appearance, more assertiveness, greater eye contact, and  identification of positive traits in self-talk messages. 4. Develop an awareness of how childhood issues have affected and continue to affect one's family life. 5. Develop healthy interpersonal relationships that lead to the alleviation and help prevent the relapse of depression. 6. Develop healthy thinking patterns and beliefs about self, others, and the world that lead to the alleviation and help prevent the relapse of depression. 7. Elevate self-esteem. Objective Demonstrate an increased ability to identify and express personal feelings. Target Date: 2023-05-24 Frequency: Biweekly  Progress: 0 Modality: individual  Related Interventions Assist the client in identifying and labeling emotions. Objective Acknowledge feeling less competent than most others. Target Date: 2023-05-24 Frequency: Biweekly  Progress: 0 Modality: individual  Related Interventions Explore the client's assessment of himself/herself and what is verbalized as the basis for negative self-perception. Actively build the level of trust with the client in individual sessions through consistent eye contact, active listening, unconditional positive regard, and warm acceptance to help increase his/her ability to identify and express feelings. Objective Decrease the frequency of negative self-descriptive statements and increase frequency of positive self-descriptive statements. Target Date: 2023-05-24 Frequency: Biweekly  Progress: 0 Modality: individual  Related Interventions Assist the client in becoming aware of how he/she expresses or acts out negative feelings about himself/herself. Help the client reframe his/her negative assessment of himself/herself. Assist the client in developing positive self-talk as a way of boosting his/her confidence and self-image (or assign "Positive Self-Talk" in the Adult Psychotherapy Homework Planner by Stephannie LiJongsma). Objective Identify and engage in activities that would improve self-image by  being  consistent with one's values. Target Date: 2023-05-24 Frequency: Biweekly  Progress: 0 Modality: individual  Related Interventions Help the client analyze his/her values and the congruence or incongruence between them and the client's daily activities. Identify and assign activities congruent with the client's values; process them toward improving self-concept and self-esteem. Objective Identify positive traits and talents about self. Target Date: 2023-05-24 Frequency: Biweekly  Progress: 0 Modality: individual  Related Interventions Assign the client the exercise of identifying his/her positive physical characteristics in a mirror to help him/her become more comfortable with himself/herself. Ask the client to keep building a list of positive traits and have him/her read the list at the beginning and end of each session (or assign "Acknowledging My Strengths" or "What Are My Good Qualities?" in the Adult Psychotherapy Homework Planner by [facility]); reinforce the client's positive self-descriptive statements. 8. Enhance ability to effectively cope with the full variety of life's worries and anxieties. 9. Establish an inward sense of self-worth, confidence, and competence. 10. Interact socially without undue distress or disability. 11. Learn and implement coping skills that result in a reduction of anxiety and worry, and improved daily functioning. 12. Let go of blame and begin to forgive others for pain caused in childhood. Objective Describe what it was like to grow up in the home environment. Target Date: 2023-05-24 Frequency: Biweekly  Progress: 0 Modality: individual  Related Interventions Develop the client's family genogram and/or symptom line and help identify patterns of dysfunction within the family. Objective Increase level of trust of others as shown by more socialization and greater intimacy tolerance. Target Date: 2023-05-24 Frequency: Biweekly  Progress: 0 Modality:  individual  Related Interventions Teach the client the share-check method of building trust in relationships (sharing a little information and checking as to the recipient's sensitivity in reacting to that information). Teach the client the advantages of treating people as trustworthy given a reasonable amount of time to assess their character. Objective Describe each family member and identify the role each played within the family. Target Date: 2023-05-24 Frequency: Biweekly  Progress: 0 Modality: individual  Related Interventions Assist the client in clarifying his/her role within the family and his/her feelings connected to that role. Objective Identify feelings associated with major traumatic incidents in childhood and with parental child-rearing patterns. Target Date: 2023-05-24 Frequency: Biweekly  Progress: 0 Modality: individual  Related Interventions Support and encourage the client when he/she begins to express feelings of rage, sadness, fear, and rejection relating to family abuse or neglect. Assign the client to record feelings in a journal that describes memories, behavior, and emotions tied to his/her traumatic childhood experiences (or assign "How the Trauma Affects Me" in the Adult Psychotherapy Homework Planner by Stephannie Li). Ask the client to read books on the emotional effects of neglect and abuse in childhood (e.g., It Will Never Happen to Me by [facility]; Outgrowing the Pain by Bronson Curb; Healing the Child Within by [facility]); process insights attained. 13. Reduce overall frequency, intensity, and duration of the anxiety so that daily functioning is not impaired. Objective Reestablish a consistent sleep-wake cycle. Target Date: 2023-05-24 Frequency: Biweekly  Progress: 0 Modality: individual  Related Interventions Teach and implement sleep hygiene practices to help the client reestablish a consistent sleep-wake cycle; review, reinforce success, and provide corrective feedback  toward improvement. Objective Identify, challenge, and replace biased, fearful self-talk with positive, realistic, and empowering self-talk. Target Date: 2023-05-24 Frequency: Biweekly  Progress: 0 Modality: individual  Related Interventions Explore the client's schema and self-talk that mediate his/her fear response;  assist him/her in challenging the biases; replace the distorted messages with reality-based alternatives and positive, realistic self-talk that will increase his/her self-confidence in coping with irrational fears (see Cognitive Therapy of Anxiety Disorders by Laurence Slate). Assign the client a homework exercise in which he/she identifies fearful self-talk, identifies biases in the self-talk, generates alternatives, and tests through behavioral experiments (or assign "Negative Thoughts Trigger Negative Feelings" in the Adult Psychotherapy Homework Planner by [facility]); review and reinforce success, providing corrective feedback toward improvement. Objective Learn and implement problem-solving strategies for realistically addressing worries. Target Date: 2023-05-24 Frequency: Biweekly  Progress: 0 Modality: individual  Related Interventions Teach the client problem-solving strategies involving specifically defining a problem, generating options for addressing it, evaluating the pros and cons of each option, selecting and implementing an optional action, and reevaluating and refining the action (or assign "Applying Problem-Solving to Interpersonal Conflict" in the Adult Psychotherapy Homework Planner by Stephannie Li). Objective Learn and implement calming skills to reduce overall anxiety and manage anxiety symptoms. Target Date: 2023-05-24 Frequency: Biweekly  Progress: 0 Modality: individual  Related Interventions Teach the client calming/relaxation skills (e.g., applied relaxation, progressive muscle relaxation, cue controlled relaxation; mindful breathing; biofeedback) and how to  discriminate better between relaxation and tension; teach the client how to apply these skills to his/her daily life (e.g., New Directions in Progressive Muscle Relaxation by Marcelyn Ditty, and Hazlett-Stevens; Treating Generalized Anxiety Disorder by Rygh and Ida Rogue). Assign the client to read about progressive muscle relaxation and other calming strategies in relevant books or treatment manuals (e.g., Progressive Relaxation Training by Robb Matar and Alen Blew; Mastery of Your Anxiety and Worry: Workbook by Earlie Counts). Objective Learn and implement relapse prevention strategies for managing possible future anxiety symptoms. Target Date: 2023-05-24 Frequency: Biweekly  Progress: 0 Modality: individual  Related Interventions Discuss with the client the distinction between a lapse and relapse, associating a lapse with an initial and reversible return of worry, anxiety symptoms, or urges to avoid, and relapse with the decision to continue the fearful and avoidant patterns. Identify and rehearse with the client the management of future situations or circumstances in which lapses could occur. Instruct the client to routinely use new therapeutic skills (e.g., relaxation, cognitive restructuring, exposure, and problem-solving) in daily life to address emergent worries, anxiety, and avoidant tendencies. Develop a "coping card" on which coping strategies and other important information (e.g., "Breathe deeply and relax," "Challenge unrealistic worries," "Use problem-solving") are written for the client's later use. Objective Identify the major life conflicts from the past and present that form the basis for present anxiety. Target Date: 2023-05-24 Frequency: Biweekly  Progress: 0 Modality: individual  Related Interventions Reinforce the client's insights into the role of his/her past emotional pain and present anxiety. Ask the client to develop and process a list of key past and present life  conflicts that continue to cause worry. Assist the client in becoming aware of key unresolved life conflicts and in starting to work toward their resolution. 14. Release the emotions associated with past childhood/family issues, resulting in less resentment and more serenity. 15. Resolve past childhood/family issues, leading to less anger and depression, greater self-esteem, security, and confidence. 16. Resolve the core conflict that is the source of anxiety. 17. Stabilize anxiety level while increasing ability to function on a daily basis.  Diagnosis:Major depressive disorder, recurrent episode, moderate  Generalized anxiety disorder  PTSD (post-traumatic stress disorder)  Plan:  -meet again on Friday, August 20, 2022 at 9am in person.

## 2022-08-20 ENCOUNTER — Encounter: Payer: Self-pay | Admitting: Professional

## 2022-08-20 ENCOUNTER — Ambulatory Visit (INDEPENDENT_AMBULATORY_CARE_PROVIDER_SITE_OTHER): Payer: BC Managed Care – PPO | Admitting: Professional

## 2022-08-20 DIAGNOSIS — F411 Generalized anxiety disorder: Secondary | ICD-10-CM

## 2022-08-20 DIAGNOSIS — F331 Major depressive disorder, recurrent, moderate: Secondary | ICD-10-CM

## 2022-08-20 NOTE — Progress Notes (Signed)
[facility] Counselor/Therapist Progress Note  Patient ID: Shannon [name], MRN: [id number],    Date: 08/20/2022  Time Spent: 46 minutes 9-946am   Treatment Type: Individual Therapy  Risk Assessment: Danger to Self:  No Self-injurious Behavior: No Danger to Others: No  Subjective: This session was held via video teletherapy. The patient consented to video teletherapy and was located at her home during this session. She is aware it is the responsibility of the patient to secure confidentiality on her end of the session. The provider was in a private home office for the duration of this session.   The patient arrived late for her Caregility session due to connectivity issues.   Issues addressed: 1-marital a-pt just got back from the beach b-she and [name] had two days with no interruptions -no kids, grandkids, no calls until Wednesday afternoon c-first time they had any time to be together -not talking about [name], her parents, or the house -"it was kinda nice, no it was very nice" d-[name] "has always been my very very best friend and still is" -he has always been there for me but in intimacy they have not been intimate in five years -she knows they are both bothered by it -pt has shared her with her spouse that his weight is an issue for her, he weighs 300 lbs. -pt has told him the weight is an issue, a huge issue -her spouse has gone to [facility] and that has not worked -they have recently gone to a nutritionist for her to gain weight and for [name] to lose weight 2-stressors a-calls from [employer] b-texts from real estate agent regarding sale of house c-"in the man cave is my dad's desk" -her father's desk is not wanted by her sons -she and her sister [name] cannot "see" the desk removed -the buyers like the desk and plan to keep it d-pt worried for spouse's health due to him being 300 lbs.  Treatment Plan Problems Addressed  Anxiety,  Childhood Trauma, Low Self-Esteem, Unipolar Depression Goals 1. Alleviate depressive symptoms and return to previous level of effective functioning. 2. Appropriately grieve the loss in order to normalize mood and to return to previously adaptive level of functioning. Objective Learn and implement behavioral strategies to overcome depression. Target Date: 2023-05-24 Frequency: Biweekly  Progress: 0 Modality: individual  Related Interventions Engage the client in "behavioral activation," increasing his/her activity level and contact with sources of reward, while identifying processes that inhibit activation (see Behavioral Activation for Depression by Katharine Look, Dimidjian, and Herman-Dunn; or assign "Identify and Schedule Pleasant Activities" in the Adult Psychotherapy Homework Planner by [facility]); use behavioral techniques such as instruction, rehearsal, role-playing, role reversal, as needed, to facilitate activity in the client's daily life; reinforce success. Assist the client in developing skills that increase the likelihood of deriving pleasure from behavioral activation (e.g., assertiveness skills, developing an exercise plan, less internal/more external focus, increased social involvement); reinforce success. Objective Learn and implement conflict resolution skills to resolve interpersonal problems. Target Date: 2023-05-24 Frequency: Biweekly  Progress: 0 Modality: individual  Related Interventions Teach conflict resolution skills (e.g., empathy, active listening, "I messages," respectful communication, assertiveness without aggression, compromise); use psychoeducation, modeling, role-playing, and rehearsal to work through several current conflicts; assign homework exercises; review and repeat so as to integrate their use into the client's life. Objective Identify important people in life, past and present, and describe the quality, good and poor, of those relationships. Target Date:  2023-05-24 Frequency: Biweekly  Progress: 0  Modality: individual  Objective Learn and implement relapse prevention skills. Target Date: 2023-05-24 Frequency: Biweekly  Progress: 0 Modality: individual  Related Interventions Identify and rehearse with the client the management of future situations or circumstances in which lapses could occur. Objective Verbalize an understanding of healthy and unhealthy emotions with the intent of increasing the use of healthy emotions to guide actions. Target Date: 2023-05-24 Frequency: Biweekly  Progress: 0 Modality: individual  Related Interventions Use a process-experiential approach consistent with Emotion-Focused Therapy to create a safe, nurturing environment in which the client can process emotions, learning to identify and regulate unhealthy feelings and to generate more adaptive ones that then guide actions (see Emotion-Focused Therapy for Depression by Peter Minium). 3. Demonstrate improved self-esteem through more pride in appearance, more assertiveness, greater eye contact, and identification of positive traits in self-talk messages. 4. Develop an awareness of how childhood issues have affected and continue to affect one's family life. 5. Develop healthy interpersonal relationships that lead to the alleviation and help prevent the relapse of depression. 6. Develop healthy thinking patterns and beliefs about self, others, and the world that lead to the alleviation and help prevent the relapse of depression. 7. Elevate self-esteem. Objective Demonstrate an increased ability to identify and express personal feelings. Target Date: 2023-05-24 Frequency: Biweekly  Progress: 0 Modality: individual  Related Interventions Assist the client in identifying and labeling emotions. Objective Acknowledge feeling less competent than most others. Target Date: 2023-05-24 Frequency: Biweekly  Progress: 0 Modality: individual  Related  Interventions Explore the client's assessment of himself/herself and what is verbalized as the basis for negative self-perception. Actively build the level of trust with the client in individual sessions through consistent eye contact, active listening, unconditional positive regard, and warm acceptance to help increase his/her ability to identify and express feelings. Objective Decrease the frequency of negative self-descriptive statements and increase frequency of positive self-descriptive statements. Target Date: 2023-05-24 Frequency: Biweekly  Progress: 0 Modality: individual  Related Interventions Assist the client in becoming aware of how he/she expresses or acts out negative feelings about himself/herself. Help the client reframe his/her negative assessment of himself/herself. Assist the client in developing positive self-talk as a way of boosting his/her confidence and self-image (or assign "Positive Self-Talk" in the Adult Psychotherapy Homework Planner by Stephannie Li). Objective Identify and engage in activities that would improve self-image by being consistent with one's values. Target Date: 2023-05-24 Frequency: Biweekly  Progress: 0 Modality: individual  Related Interventions Help the client analyze his/her values and the congruence or incongruence between them and the client's daily activities. Identify and assign activities congruent with the client's values; process them toward improving self-concept and self-esteem. Objective Identify positive traits and talents about self. Target Date: 2023-05-24 Frequency: Biweekly  Progress: 0 Modality: individual  Related Interventions Assign the client the exercise of identifying his/her positive physical characteristics in a mirror to help him/her become more comfortable with himself/herself. Ask the client to keep building a list of positive traits and have him/her read the list at the beginning and end of each session (or assign  "Acknowledging My Strengths" or "What Are My Good Qualities?" in the Adult Psychotherapy Homework Planner by [facility]); reinforce the client's positive self-descriptive statements. 8. Enhance ability to effectively cope with the full variety of life's worries and anxieties. 9. Establish an inward sense of self-worth, confidence, and competence. 10. Interact socially without undue distress or disability. 11. Learn and implement coping skills that result in a reduction of anxiety and worry, and improved  daily functioning. 12. Let go of blame and begin to forgive others for pain caused in childhood. Objective Describe what it was like to grow up in the home environment. Target Date: 2023-05-24 Frequency: Biweekly  Progress: 0 Modality: individual  Related Interventions Develop the client's family genogram and/or symptom line and help identify patterns of dysfunction within the family. Objective Increase level of trust of others as shown by more socialization and greater intimacy tolerance. Target Date: 2023-05-24 Frequency: Biweekly  Progress: 0 Modality: individual  Related Interventions Teach the client the share-check method of building trust in relationships (sharing a little information and checking as to the recipient's sensitivity in reacting to that information). Teach the client the advantages of treating people as trustworthy given a reasonable amount of time to assess their character. Objective Describe each family member and identify the role each played within the family. Target Date: 2023-05-24 Frequency: Biweekly  Progress: 0 Modality: individual  Related Interventions Assist the client in clarifying his/her role within the family and his/her feelings connected to that role. Objective Identify feelings associated with major traumatic incidents in childhood and with parental child-rearing patterns. Target Date: 2023-05-24 Frequency: Biweekly  Progress: 0 Modality: individual   Related Interventions Support and encourage the client when he/she begins to express feelings of rage, sadness, fear, and rejection relating to family abuse or neglect. Assign the client to record feelings in a journal that describes memories, behavior, and emotions tied to his/her traumatic childhood experiences (or assign "How the Trauma Affects Me" in the Adult Psychotherapy Homework Planner by Stephannie Li). Ask the client to read books on the emotional effects of neglect and abuse in childhood (e.g., It Will Never Happen to Me by [facility]; Outgrowing the Pain by Bronson Curb; Healing the Child Within by [facility]); process insights attained. 13. Reduce overall frequency, intensity, and duration of the anxiety so that daily functioning is not impaired. Objective Reestablish a consistent sleep-wake cycle. Target Date: 2023-05-24 Frequency: Biweekly  Progress: 0 Modality: individual  Related Interventions Teach and implement sleep hygiene practices to help the client reestablish a consistent sleep-wake cycle; review, reinforce success, and provide corrective feedback toward improvement. Objective Identify, challenge, and replace biased, fearful self-talk with positive, realistic, and empowering self-talk. Target Date: 2023-05-24 Frequency: Biweekly  Progress: 0 Modality: individual  Related Interventions Explore the client's schema and self-talk that mediate his/her fear response; assist him/her in challenging the biases; replace the distorted messages with reality-based alternatives and positive, realistic self-talk that will increase his/her self-confidence in coping with irrational fears (see Cognitive Therapy of Anxiety Disorders by Laurence Slate). Assign the client a homework exercise in which he/she identifies fearful self-talk, identifies biases in the self-talk, generates alternatives, and tests through behavioral experiments (or assign "Negative Thoughts Trigger Negative Feelings" in the Adult  Psychotherapy Homework Planner by [facility]); review and reinforce success, providing corrective feedback toward improvement. Objective Learn and implement problem-solving strategies for realistically addressing worries. Target Date: 2023-05-24 Frequency: Biweekly  Progress: 0 Modality: individual  Related Interventions Teach the client problem-solving strategies involving specifically defining a problem, generating options for addressing it, evaluating the pros and cons of each option, selecting and implementing an optional action, and reevaluating and refining the action (or assign "Applying Problem-Solving to Interpersonal Conflict" in the Adult Psychotherapy Homework Planner by Stephannie Li). Objective Learn and implement calming skills to reduce overall anxiety and manage anxiety symptoms. Target Date: 2023-05-24 Frequency: Biweekly  Progress: 0 Modality: individual  Related Interventions Teach the client calming/relaxation skills (e.g., applied relaxation, progressive  muscle relaxation, cue controlled relaxation; mindful breathing; biofeedback) and how to discriminate better between relaxation and tension; teach the client how to apply these skills to his/her daily life (e.g., New Directions in Progressive Muscle Relaxation by Marcelyn Ditty, and Hazlett-Stevens; Treating Generalized Anxiety Disorder by Rygh and Ida Rogue). Assign the client to read about progressive muscle relaxation and other calming strategies in relevant books or treatment manuals (e.g., Progressive Relaxation Training by Robb Matar and Alen Blew; Mastery of Your Anxiety and Worry: Workbook by Earlie Counts). Objective Learn and implement relapse prevention strategies for managing possible future anxiety symptoms. Target Date: 2023-05-24 Frequency: Biweekly  Progress: 0 Modality: individual  Related Interventions Discuss with the client the distinction between a lapse and relapse, associating a lapse with an initial and  reversible return of worry, anxiety symptoms, or urges to avoid, and relapse with the decision to continue the fearful and avoidant patterns. Identify and rehearse with the client the management of future situations or circumstances in which lapses could occur. Instruct the client to routinely use new therapeutic skills (e.g., relaxation, cognitive restructuring, exposure, and problem-solving) in daily life to address emergent worries, anxiety, and avoidant tendencies. Develop a "coping card" on which coping strategies and other important information (e.g., "Breathe deeply and relax," "Challenge unrealistic worries," "Use problem-solving") are written for the client's later use. Objective Identify the major life conflicts from the past and present that form the basis for present anxiety. Target Date: 2023-05-24 Frequency: Biweekly  Progress: 0 Modality: individual  Related Interventions Reinforce the client's insights into the role of his/her past emotional pain and present anxiety. Ask the client to develop and process a list of key past and present life conflicts that continue to cause worry. Assist the client in becoming aware of key unresolved life conflicts and in starting to work toward their resolution. 14. Release the emotions associated with past childhood/family issues, resulting in less resentment and more serenity. 15. Resolve past childhood/family issues, leading to less anger and depression, greater self-esteem, security, and confidence. 16. Resolve the core conflict that is the source of anxiety. 17. Stabilize anxiety level while increasing ability to function on a daily basis.  Diagnosis:Major depressive disorder, recurrent episode, moderate  Generalized anxiety disorder  Plan:  -meet again Thursday, August 26, 2022 at 1pm in person.

## 2022-08-25 ENCOUNTER — Other Ambulatory Visit: Payer: Self-pay | Admitting: Family

## 2022-08-25 NOTE — Telephone Encounter (Signed)
Requesting: Klonopin Contract: 07/22/2022 UDS: 07/22/2022 Last Visit: 07/22/2022 Next Visit: N/A Last Refill: 07/28/2022  Please Advise

## 2022-08-26 ENCOUNTER — Ambulatory Visit: Payer: BC Managed Care – PPO | Admitting: Professional

## 2022-08-27 ENCOUNTER — Encounter: Payer: Self-pay | Admitting: Family

## 2022-08-30 MED ORDER — KLONOPIN 0.5 MG PO TABS: 0.5000 mg | ORAL_TABLET | Freq: Three times a day (TID) | ORAL | 0 refills | Status: DC | PRN

## 2022-08-31 ENCOUNTER — Ambulatory Visit (INDEPENDENT_AMBULATORY_CARE_PROVIDER_SITE_OTHER): Payer: BC Managed Care – PPO | Admitting: Professional

## 2022-08-31 ENCOUNTER — Encounter: Payer: Self-pay | Admitting: Professional

## 2022-08-31 DIAGNOSIS — F411 Generalized anxiety disorder: Secondary | ICD-10-CM

## 2022-08-31 DIAGNOSIS — F331 Major depressive disorder, recurrent, moderate: Secondary | ICD-10-CM | POA: Diagnosis not present

## 2022-08-31 DIAGNOSIS — F4310 Post-traumatic stress disorder, unspecified: Secondary | ICD-10-CM

## 2022-08-31 NOTE — Addendum Note (Signed)
Addended by: Irven Coe on: 08/31/2022 09:46 AM   Modules accepted: Level of Service

## 2022-08-31 NOTE — Progress Notes (Signed)
[facility] Counselor/Therapist Progress Note  Patient ID: Shannon [name], MRN: [id number],    Date: 08/31/2022  Time Spent: 58 minutes [id number]   Treatment Type: Individual Therapy  Risk Assessment: Danger to Self:  No Self-injurious Behavior: No Danger to Others: No  Subjective: This session was held via video teletherapy. The patient consented to video teletherapy and was located at her home during this session. She is aware it is the responsibility of the patient to secure confidentiality on her end of the session. The provider was in a private home office for the duration of this session.   The patient arrived late for her Caregility session due to connectivity issues.   Issues addressed: 1-feels frazzled -because of her sister and her mother -had two birthday parties lat week -her sister scheduled the birthday party during her therapy session last week 2-sale of home -closing on home will be the 15th -[name] will be back in [place] on 13th 3-mother a-meeting this past Friday with [facility] b-got the news they did not want but knew they were going to get -expected timeline was 90 days -if she aspirates they will intervene once and then would not intervene c-pt and sister addressed some concerns they have with her mother's care d-when they went back on Saturday care conditions appeared to have declined -[name] decided her mother was unable to stay there -they will be discussing further when she returns -[name]'s emails have an undercurrent of blame e-what does pt want for her mother's end of life process -she does not want her in care, she does not want her to be alone when she dies -pt tries to fix everything  Treatment Plan Problems Addressed  Anxiety, Childhood Trauma, Low Self-Esteem, Unipolar Depression Goals 1. Alleviate depressive symptoms and return to previous level of effective functioning. 2. Appropriately grieve the loss in order to normalize mood and to  return to previously adaptive level of functioning. Objective Learn and implement behavioral strategies to overcome depression. Target Date: 2023-05-24 Frequency: Biweekly  Progress: 0 Modality: individual  Related Interventions Engage the client in "behavioral activation," increasing his/her activity level and contact with sources of reward, while identifying processes that inhibit activation (see Behavioral Activation for Depression by Katharine Look, Dimidjian, and Herman-Dunn; or assign "Identify and Schedule Pleasant Activities" in the Adult Psychotherapy Homework Planner by [facility]); use behavioral techniques such as instruction, rehearsal, role-playing, role reversal, as needed, to facilitate activity in the client's daily life; reinforce success. Assist the client in developing skills that increase the likelihood of deriving pleasure from behavioral activation (e.g., assertiveness skills, developing an exercise plan, less internal/more external focus, increased social involvement); reinforce success. Objective Learn and implement conflict resolution skills to resolve interpersonal problems. Target Date: 2023-05-24 Frequency: Biweekly  Progress: 0 Modality: individual  Related Interventions Teach conflict resolution skills (e.g., empathy, active listening, "I messages," respectful communication, assertiveness without aggression, compromise); use psychoeducation, modeling, role-playing, and rehearsal to work through several current conflicts; assign homework exercises; review and repeat so as to integrate their use into the client's life. Objective Identify important people in life, past and present, and describe the quality, good and poor, of those relationships. Target Date: 2023-05-24 Frequency: Biweekly  Progress: 0 Modality: individual  Objective Learn and implement relapse prevention skills. Target Date: 2023-05-24 Frequency: Biweekly  Progress: 0 Modality: individual  Related  Interventions Identify and rehearse with the client the management of future situations or circumstances in which lapses could occur. Objective Verbalize an understanding of healthy and unhealthy emotions  with the intent of increasing the use of healthy emotions to guide actions. Target Date: 2023-05-24 Frequency: Biweekly  Progress: 0 Modality: individual  Related Interventions Use a process-experiential approach consistent with Emotion-Focused Therapy to create a safe, nurturing environment in which the client can process emotions, learning to identify and regulate unhealthy feelings and to generate more adaptive ones that then guide actions (see Emotion-Focused Therapy for Depression by Peter Minium). 3. Demonstrate improved self-esteem through more pride in appearance, more assertiveness, greater eye contact, and identification of positive traits in self-talk messages. 4. Develop an awareness of how childhood issues have affected and continue to affect one's family life. 5. Develop healthy interpersonal relationships that lead to the alleviation and help prevent the relapse of depression. 6. Develop healthy thinking patterns and beliefs about self, others, and the world that lead to the alleviation and help prevent the relapse of depression. 7. Elevate self-esteem. Objective Demonstrate an increased ability to identify and express personal feelings. Target Date: 2023-05-24 Frequency: Biweekly  Progress: 0 Modality: individual  Related Interventions Assist the client in identifying and labeling emotions. Objective Acknowledge feeling less competent than most others. Target Date: 2023-05-24 Frequency: Biweekly  Progress: 0 Modality: individual  Related Interventions Explore the client's assessment of himself/herself and what is verbalized as the basis for negative self-perception. Actively build the level of trust with the client in individual sessions through consistent eye  contact, active listening, unconditional positive regard, and warm acceptance to help increase his/her ability to identify and express feelings. Objective Decrease the frequency of negative self-descriptive statements and increase frequency of positive self-descriptive statements. Target Date: 2023-05-24 Frequency: Biweekly  Progress: 0 Modality: individual  Related Interventions Assist the client in becoming aware of how he/she expresses or acts out negative feelings about himself/herself. Help the client reframe his/her negative assessment of himself/herself. Assist the client in developing positive self-talk as a way of boosting his/her confidence and self-image (or assign "Positive Self-Talk" in the Adult Psychotherapy Homework Planner by Stephannie Li). Objective Identify and engage in activities that would improve self-image by being consistent with one's values. Target Date: 2023-05-24 Frequency: Biweekly  Progress: 0 Modality: individual  Related Interventions Help the client analyze his/her values and the congruence or incongruence between them and the client's daily activities. Identify and assign activities congruent with the client's values; process them toward improving self-concept and self-esteem. Objective Identify positive traits and talents about self. Target Date: 2023-05-24 Frequency: Biweekly  Progress: 0 Modality: individual  Related Interventions Assign the client the exercise of identifying his/her positive physical characteristics in a mirror to help him/her become more comfortable with himself/herself. Ask the client to keep building a list of positive traits and have him/her read the list at the beginning and end of each session (or assign "Acknowledging My Strengths" or "What Are My Good Qualities?" in the Adult Psychotherapy Homework Planner by [facility]); reinforce the client's positive self-descriptive statements. 8. Enhance ability to effectively cope with the full  variety of life's worries and anxieties. 9. Establish an inward sense of self-worth, confidence, and competence. 10. Interact socially without undue distress or disability. 11. Learn and implement coping skills that result in a reduction of anxiety and worry, and improved daily functioning. 12. Let go of blame and begin to forgive others for pain caused in childhood. Objective Describe what it was like to grow up in the home environment. Target Date: 2023-05-24 Frequency: Biweekly  Progress: 0 Modality: individual  Related Interventions Develop the client's family genogram and/or symptom  line and help identify patterns of dysfunction within the family. Objective Increase level of trust of others as shown by more socialization and greater intimacy tolerance. Target Date: 2023-05-24 Frequency: Biweekly  Progress: 0 Modality: individual  Related Interventions Teach the client the share-check method of building trust in relationships (sharing a little information and checking as to the recipient's sensitivity in reacting to that information). Teach the client the advantages of treating people as trustworthy given a reasonable amount of time to assess their character. Objective Describe each family member and identify the role each played within the family. Target Date: 2023-05-24 Frequency: Biweekly  Progress: 0 Modality: individual  Related Interventions Assist the client in clarifying his/her role within the family and his/her feelings connected to that role. Objective Identify feelings associated with major traumatic incidents in childhood and with parental child-rearing patterns. Target Date: 2023-05-24 Frequency: Biweekly  Progress: 0 Modality: individual  Related Interventions Support and encourage the client when he/she begins to express feelings of rage, sadness, fear, and rejection relating to family abuse or neglect. Assign the client to record feelings in a journal that describes  memories, behavior, and emotions tied to his/her traumatic childhood experiences (or assign "How the Trauma Affects Me" in the Adult Psychotherapy Homework Planner by Stephannie Li). Ask the client to read books on the emotional effects of neglect and abuse in childhood (e.g., It Will Never Happen to Me by [facility]; Outgrowing the Pain by Bronson Curb; Healing the Child Within by [facility]); process insights attained. 13. Reduce overall frequency, intensity, and duration of the anxiety so that daily functioning is not impaired. Objective Reestablish a consistent sleep-wake cycle. Target Date: 2023-05-24 Frequency: Biweekly  Progress: 0 Modality: individual  Related Interventions Teach and implement sleep hygiene practices to help the client reestablish a consistent sleep-wake cycle; review, reinforce success, and provide corrective feedback toward improvement. Objective Identify, challenge, and replace biased, fearful self-talk with positive, realistic, and empowering self-talk. Target Date: 2023-05-24 Frequency: Biweekly  Progress: 0 Modality: individual  Related Interventions Explore the client's schema and self-talk that mediate his/her fear response; assist him/her in challenging the biases; replace the distorted messages with reality-based alternatives and positive, realistic self-talk that will increase his/her self-confidence in coping with irrational fears (see Cognitive Therapy of Anxiety Disorders by Laurence Slate). Assign the client a homework exercise in which he/she identifies fearful self-talk, identifies biases in the self-talk, generates alternatives, and tests through behavioral experiments (or assign "Negative Thoughts Trigger Negative Feelings" in the Adult Psychotherapy Homework Planner by [facility]); review and reinforce success, providing corrective feedback toward improvement. Objective Learn and implement problem-solving strategies for realistically addressing worries. Target Date: 2023-05-24  Frequency: Biweekly  Progress: 0 Modality: individual  Related Interventions Teach the client problem-solving strategies involving specifically defining a problem, generating options for addressing it, evaluating the pros and cons of each option, selecting and implementing an optional action, and reevaluating and refining the action (or assign "Applying Problem-Solving to Interpersonal Conflict" in the Adult Psychotherapy Homework Planner by Stephannie Li). Objective Learn and implement calming skills to reduce overall anxiety and manage anxiety symptoms. Target Date: 2023-05-24 Frequency: Biweekly  Progress: 0 Modality: individual  Related Interventions Teach the client calming/relaxation skills (e.g., applied relaxation, progressive muscle relaxation, cue controlled relaxation; mindful breathing; biofeedback) and how to discriminate better between relaxation and tension; teach the client how to apply these skills to his/her daily life (e.g., New Directions in Progressive Muscle Relaxation by Marcelyn Ditty, and Hazlett-Stevens; Treating Generalized Anxiety Disorder by Rygh and Ida Rogue). Assign  the client to read about progressive muscle relaxation and other calming strategies in relevant books or treatment manuals (e.g., Progressive Relaxation Training by Robb Matar and Alen Blew; Mastery of Your Anxiety and Worry: Workbook by Earlie Counts). Objective Learn and implement relapse prevention strategies for managing possible future anxiety symptoms. Target Date: 2023-05-24 Frequency: Biweekly  Progress: 0 Modality: individual  Related Interventions Discuss with the client the distinction between a lapse and relapse, associating a lapse with an initial and reversible return of worry, anxiety symptoms, or urges to avoid, and relapse with the decision to continue the fearful and avoidant patterns. Identify and rehearse with the client the management of future situations or circumstances in which  lapses could occur. Instruct the client to routinely use new therapeutic skills (e.g., relaxation, cognitive restructuring, exposure, and problem-solving) in daily life to address emergent worries, anxiety, and avoidant tendencies. Develop a "coping card" on which coping strategies and other important information (e.g., "Breathe deeply and relax," "Challenge unrealistic worries," "Use problem-solving") are written for the client's later use. Objective Identify the major life conflicts from the past and present that form the basis for present anxiety. Target Date: 2023-05-24 Frequency: Biweekly  Progress: 0 Modality: individual  Related Interventions Reinforce the client's insights into the role of his/her past emotional pain and present anxiety. Ask the client to develop and process a list of key past and present life conflicts that continue to cause worry. Assist the client in becoming aware of key unresolved life conflicts and in starting to work toward their resolution. 14. Release the emotions associated with past childhood/family issues, resulting in less resentment and more serenity. 15. Resolve past childhood/family issues, leading to less anger and depression, greater self-esteem, security, and confidence. 16. Resolve the core conflict that is the source of anxiety. 17. Stabilize anxiety level while increasing ability to function on a daily basis.  Diagnosis:Major depressive disorder, recurrent episode, moderate (HCC)  Generalized anxiety disorder  PTSD (post-traumatic stress disorder)  Plan:  -meet again Thursday, Sep 16, 2022 at 11am in person.

## 2022-09-01 DIAGNOSIS — L820 Inflamed seborrheic keratosis: Secondary | ICD-10-CM | POA: Diagnosis not present

## 2022-09-02 ENCOUNTER — Ambulatory Visit: Payer: BC Managed Care – PPO | Admitting: Professional

## 2022-09-07 ENCOUNTER — Ambulatory Visit: Payer: BC Managed Care – PPO | Admitting: Cardiology

## 2022-09-16 ENCOUNTER — Encounter: Payer: Self-pay | Admitting: Professional

## 2022-09-16 ENCOUNTER — Ambulatory Visit (INDEPENDENT_AMBULATORY_CARE_PROVIDER_SITE_OTHER): Payer: BC Managed Care – PPO | Admitting: Professional

## 2022-09-16 DIAGNOSIS — F411 Generalized anxiety disorder: Secondary | ICD-10-CM | POA: Diagnosis not present

## 2022-09-16 DIAGNOSIS — F331 Major depressive disorder, recurrent, moderate: Secondary | ICD-10-CM

## 2022-09-16 NOTE — Progress Notes (Signed)
[facility] Counselor/Therapist Progress Note  Patient ID: Shannon [name], MRN: [id number],    Date: 09/16/2022  Time Spent: 54 minutes 1059-1153am   Treatment Type: Individual Therapy  Risk Assessment: Danger to Self:  No Self-injurious Behavior: No Danger to Others: No  Subjective: The patient arrived on time for her in-person appointment.   Issues addressed: 1-self-care -time at the beach with her spouse was wonderful -they celebrated their 36th anniversary while there -pt reports a pizza and wine on the beach with just the two of them helped her feel closer to him than she has in years -making decisions related to some or no swimming lessons this summer -pt admits light exercise would be helpful and commits to beginning 2-sale of home -closing on home has been difficult due to loss patient feels -pt was able to spend some time there before closing specifically in her father's office 3-mother a-is declining in health -was very difficult on several occasions last week reminding pt of the mother that raised her -she is thankful for time that has been sweet to help her like her mother -she has decided she cannot take her mother in the home b-pt and sister [name] had time to discuss and both agreed to do nothing for eight weeks and reassess if her mother is still alive  Treatment Plan Problems Addressed  Anxiety, Childhood Trauma, Low Self-Esteem, Unipolar Depression Goals 1. Alleviate depressive symptoms and return to previous level of effective functioning. 2. Appropriately grieve the loss in order to normalize mood and to return to previously adaptive level of functioning. Objective Learn and implement behavioral strategies to overcome depression. Target Date: 2023-05-24 Frequency: Biweekly  Progress: 0 Modality: individual  Related Interventions Engage the client in "behavioral activation," increasing his/her activity level and contact with sources of  reward, while identifying processes that inhibit activation (see Behavioral Activation for Depression by Katharine Look, Dimidjian, and Herman-Dunn; or assign "Identify and Schedule Pleasant Activities" in the Adult Psychotherapy Homework Planner by [facility]); use behavioral techniques such as instruction, rehearsal, role-playing, role reversal, as needed, to facilitate activity in the client's daily life; reinforce success. Assist the client in developing skills that increase the likelihood of deriving pleasure from behavioral activation (e.g., assertiveness skills, developing an exercise plan, less internal/more external focus, increased social involvement); reinforce success. Objective Learn and implement conflict resolution skills to resolve interpersonal problems. Target Date: 2023-05-24 Frequency: Biweekly  Progress: 0 Modality: individual  Related Interventions Teach conflict resolution skills (e.g., empathy, active listening, "I messages," respectful communication, assertiveness without aggression, compromise); use psychoeducation, modeling, role-playing, and rehearsal to work through several current conflicts; assign homework exercises; review and repeat so as to integrate their use into the client's life. Objective Identify important people in life, past and present, and describe the quality, good and poor, of those relationships. Target Date: 2023-05-24 Frequency: Biweekly  Progress: 0 Modality: individual  Objective Learn and implement relapse prevention skills. Target Date: 2023-05-24 Frequency: Biweekly  Progress: 0 Modality: individual  Related Interventions Identify and rehearse with the client the management of future situations or circumstances in which lapses could occur. Objective Verbalize an understanding of healthy and unhealthy emotions with the intent of increasing the use of healthy emotions to guide actions. Target Date: 2023-05-24 Frequency: Biweekly  Progress: 0 Modality:  individual  Related Interventions Use a process-experiential approach consistent with Emotion-Focused Therapy to create a safe, nurturing environment in which the client can process emotions, learning to identify and regulate unhealthy feelings and to generate more adaptive  ones that then guide actions (see Emotion-Focused Therapy for Depression by Peter Minium). 3. Demonstrate improved self-esteem through more pride in appearance, more assertiveness, greater eye contact, and identification of positive traits in self-talk messages. 4. Develop an awareness of how childhood issues have affected and continue to affect one's family life. 5. Develop healthy interpersonal relationships that lead to the alleviation and help prevent the relapse of depression. 6. Develop healthy thinking patterns and beliefs about self, others, and the world that lead to the alleviation and help prevent the relapse of depression. 7. Elevate self-esteem. Objective Demonstrate an increased ability to identify and express personal feelings. Target Date: 2023-05-24 Frequency: Biweekly  Progress: 0 Modality: individual  Related Interventions Assist the client in identifying and labeling emotions. Objective Acknowledge feeling less competent than most others. Target Date: 2023-05-24 Frequency: Biweekly  Progress: 0 Modality: individual  Related Interventions Explore the client's assessment of himself/herself and what is verbalized as the basis for negative self-perception. Actively build the level of trust with the client in individual sessions through consistent eye contact, active listening, unconditional positive regard, and warm acceptance to help increase his/her ability to identify and express feelings. Objective Decrease the frequency of negative self-descriptive statements and increase frequency of positive self-descriptive statements. Target Date: 2023-05-24 Frequency: Biweekly  Progress: 0 Modality:  individual  Related Interventions Assist the client in becoming aware of how he/she expresses or acts out negative feelings about himself/herself. Help the client reframe his/her negative assessment of himself/herself. Assist the client in developing positive self-talk as a way of boosting his/her confidence and self-image (or assign "Positive Self-Talk" in the Adult Psychotherapy Homework Planner by Stephannie Li). Objective Identify and engage in activities that would improve self-image by being consistent with one's values. Target Date: 2023-05-24 Frequency: Biweekly  Progress: 0 Modality: individual  Related Interventions Help the client analyze his/her values and the congruence or incongruence between them and the client's daily activities. Identify and assign activities congruent with the client's values; process them toward improving self-concept and self-esteem. Objective Identify positive traits and talents about self. Target Date: 2023-05-24 Frequency: Biweekly  Progress: 0 Modality: individual  Related Interventions Assign the client the exercise of identifying his/her positive physical characteristics in a mirror to help him/her become more comfortable with himself/herself. Ask the client to keep building a list of positive traits and have him/her read the list at the beginning and end of each session (or assign "Acknowledging My Strengths" or "What Are My Good Qualities?" in the Adult Psychotherapy Homework Planner by [facility]); reinforce the client's positive self-descriptive statements. 8. Enhance ability to effectively cope with the full variety of life's worries and anxieties. 9. Establish an inward sense of self-worth, confidence, and competence. 10. Interact socially without undue distress or disability. 11. Learn and implement coping skills that result in a reduction of anxiety and worry, and improved daily functioning. 12. Let go of blame and begin to forgive others for pain  caused in childhood. Objective Describe what it was like to grow up in the home environment. Target Date: 2023-05-24 Frequency: Biweekly  Progress: 0 Modality: individual  Related Interventions Develop the client's family genogram and/or symptom line and help identify patterns of dysfunction within the family. Objective Increase level of trust of others as shown by more socialization and greater intimacy tolerance. Target Date: 2023-05-24 Frequency: Biweekly  Progress: 0 Modality: individual  Related Interventions Teach the client the share-check method of building trust in relationships (sharing a little information and checking as to the  recipient's sensitivity in reacting to that information). Teach the client the advantages of treating people as trustworthy given a reasonable amount of time to assess their character. Objective Describe each family member and identify the role each played within the family. Target Date: 2023-05-24 Frequency: Biweekly  Progress: 0 Modality: individual  Related Interventions Assist the client in clarifying his/her role within the family and his/her feelings connected to that role. Objective Identify feelings associated with major traumatic incidents in childhood and with parental child-rearing patterns. Target Date: 2023-05-24 Frequency: Biweekly  Progress: 0 Modality: individual  Related Interventions Support and encourage the client when he/she begins to express feelings of rage, sadness, fear, and rejection relating to family abuse or neglect. Assign the client to record feelings in a journal that describes memories, behavior, and emotions tied to his/her traumatic childhood experiences (or assign "How the Trauma Affects Me" in the Adult Psychotherapy Homework Planner by Stephannie Li). Ask the client to read books on the emotional effects of neglect and abuse in childhood (e.g., It Will Never Happen to Me by [facility]; Outgrowing the Pain by Bronson Curb; Healing the  Child Within by [facility]); process insights attained. 13. Reduce overall frequency, intensity, and duration of the anxiety so that daily functioning is not impaired. Objective Reestablish a consistent sleep-wake cycle. Target Date: 2023-05-24 Frequency: Biweekly  Progress: 0 Modality: individual  Related Interventions Teach and implement sleep hygiene practices to help the client reestablish a consistent sleep-wake cycle; review, reinforce success, and provide corrective feedback toward improvement. Objective Identify, challenge, and replace biased, fearful self-talk with positive, realistic, and empowering self-talk. Target Date: 2023-05-24 Frequency: Biweekly  Progress: 0 Modality: individual  Related Interventions Explore the client's schema and self-talk that mediate his/her fear response; assist him/her in challenging the biases; replace the distorted messages with reality-based alternatives and positive, realistic self-talk that will increase his/her self-confidence in coping with irrational fears (see Cognitive Therapy of Anxiety Disorders by Laurence Slate). Assign the client a homework exercise in which he/she identifies fearful self-talk, identifies biases in the self-talk, generates alternatives, and tests through behavioral experiments (or assign "Negative Thoughts Trigger Negative Feelings" in the Adult Psychotherapy Homework Planner by [facility]); review and reinforce success, providing corrective feedback toward improvement. Objective Learn and implement problem-solving strategies for realistically addressing worries. Target Date: 2023-05-24 Frequency: Biweekly  Progress: 0 Modality: individual  Related Interventions Teach the client problem-solving strategies involving specifically defining a problem, generating options for addressing it, evaluating the pros and cons of each option, selecting and implementing an optional action, and reevaluating and refining the action (or assign  "Applying Problem-Solving to Interpersonal Conflict" in the Adult Psychotherapy Homework Planner by Stephannie Li). Objective Learn and implement calming skills to reduce overall anxiety and manage anxiety symptoms. Target Date: 2023-05-24 Frequency: Biweekly  Progress: 0 Modality: individual  Related Interventions Teach the client calming/relaxation skills (e.g., applied relaxation, progressive muscle relaxation, cue controlled relaxation; mindful breathing; biofeedback) and how to discriminate better between relaxation and tension; teach the client how to apply these skills to his/her daily life (e.g., New Directions in Progressive Muscle Relaxation by Marcelyn Ditty, and Hazlett-Stevens; Treating Generalized Anxiety Disorder by Rygh and Ida Rogue). Assign the client to read about progressive muscle relaxation and other calming strategies in relevant books or treatment manuals (e.g., Progressive Relaxation Training by Robb Matar and Alen Blew; Mastery of Your Anxiety and Worry: Workbook by Earlie Counts). Objective Learn and implement relapse prevention strategies for managing possible future anxiety symptoms. Target Date: 2023-05-24 Frequency: Biweekly  Progress: 0 Modality:  individual  Related Interventions Discuss with the client the distinction between a lapse and relapse, associating a lapse with an initial and reversible return of worry, anxiety symptoms, or urges to avoid, and relapse with the decision to continue the fearful and avoidant patterns. Identify and rehearse with the client the management of future situations or circumstances in which lapses could occur. Instruct the client to routinely use new therapeutic skills (e.g., relaxation, cognitive restructuring, exposure, and problem-solving) in daily life to address emergent worries, anxiety, and avoidant tendencies. Develop a "coping card" on which coping strategies and other important information (e.g., "Breathe deeply and relax,"  "Challenge unrealistic worries," "Use problem-solving") are written for the client's later use. Objective Identify the major life conflicts from the past and present that form the basis for present anxiety. Target Date: 2023-05-24 Frequency: Biweekly  Progress: 0 Modality: individual  Related Interventions Reinforce the client's insights into the role of his/her past emotional pain and present anxiety. Ask the client to develop and process a list of key past and present life conflicts that continue to cause worry. Assist the client in becoming aware of key unresolved life conflicts and in starting to work toward their resolution. 14. Release the emotions associated with past childhood/family issues, resulting in less resentment and more serenity. 15. Resolve past childhood/family issues, leading to less anger and depression, greater self-esteem, security, and confidence. 16. Resolve the core conflict that is the source of anxiety. 17. Stabilize anxiety level while increasing ability to function on a daily basis.  Diagnosis:Major depressive disorder, recurrent episode, moderate (HCC)  Generalized anxiety disorder  Plan:  -meet again Thursday, Sep 30, 2022 at 12pam in person.

## 2022-09-20 DIAGNOSIS — K5904 Chronic idiopathic constipation: Secondary | ICD-10-CM | POA: Diagnosis not present

## 2022-09-22 ENCOUNTER — Ambulatory Visit (INDEPENDENT_AMBULATORY_CARE_PROVIDER_SITE_OTHER): Payer: BC Managed Care – PPO | Admitting: Professional

## 2022-09-22 ENCOUNTER — Encounter: Payer: Self-pay | Admitting: Professional

## 2022-09-22 DIAGNOSIS — F331 Major depressive disorder, recurrent, moderate: Secondary | ICD-10-CM

## 2022-09-22 DIAGNOSIS — F411 Generalized anxiety disorder: Secondary | ICD-10-CM | POA: Diagnosis not present

## 2022-09-22 DIAGNOSIS — F4310 Post-traumatic stress disorder, unspecified: Secondary | ICD-10-CM | POA: Diagnosis not present

## 2022-09-22 NOTE — Progress Notes (Addendum)
Behavioral Health Counselor/Therapist Progress Note  Patient ID: Shannon [name], MRN: [id number],    Date: 09/22/2022  Time Spent: 52 minutes 11-1152am   Treatment Type: Individual Therapy  Risk Assessment: Danger to Self:  No Self-injurious Behavior: No Danger to Others: No  Subjective: The patient arrived on time for her in-person appointment.   Issues addressed: 1-mother -still at [facility] -[name] left to return home -[name] said at the end of June she will move her mom to [place] via ambulance -[name] said not having 24/7 care via hospice in pt's home she will move to [place] -pt shared with [name] that she wants to discuss things to do with their mother on the phone and not via text -pt is surprised that she feels upset that amy plans to take their mother to [place] -[name] left Sunday morning  B-shame related for pregnancy -children were planning to come over for a relaxing afternoon until her son tim wants to invite a minimum of three friends -pt found the beginning of the visit was fun but then it came over her that she needed a break  -pt thought about getting away and driving over to the house to get her parents mail and realized that they no longer own the house -pt has not driven by her parents home but wants to and cannot -feels disappointed that the new owners have not reached out through their realtor the way she had reached out -discussed difference in values and -pt shared her ongoing shame and guilt associated with getting pregnant her first time at 89  Treatment Plan Problems Addressed  Anxiety, Childhood Trauma, Low Self-Esteem, Unipolar Depression Goals 1. Alleviate depressive symptoms and return to previous level of effective functioning. 2. Appropriately grieve the loss in order to normalize mood and to return to previously adaptive level of functioning. Objective Learn and implement behavioral strategies to overcome depression. Target Date: 2023-05-24 Frequency:  Biweekly  Progress: 0 Modality: individual  Related Interventions Engage the client in "behavioral activation," increasing his/her activity level and contact with sources of reward, while identifying processes that inhibit activation (see Behavioral Activation for Depression by Katharine Look, Dimidjian, and Herman-Dunn; or assign "Identify and Schedule Pleasant Activities" in the Adult Psychotherapy Homework Planner by [facility]); use behavioral techniques such as instruction, rehearsal, role-playing, role reversal, as needed, to facilitate activity in the client's daily life; reinforce success. Assist the client in developing skills that increase the likelihood of deriving pleasure from behavioral activation (e.g., assertiveness skills, developing an exercise plan, less internal/more external focus, increased social involvement); reinforce success. Objective Learn and implement conflict resolution skills to resolve interpersonal problems. Target Date: 2023-05-24 Frequency: Biweekly  Progress: 0 Modality: individual  Related Interventions Teach conflict resolution skills (e.g., empathy, active listening, "I messages," respectful communication, assertiveness without aggression, compromise); use psychoeducation, modeling, role-playing, and rehearsal to work through several current conflicts; assign homework exercises; review and repeat so as to integrate their use into the client's life. Objective Identify important people in life, past and present, and describe the quality, good and poor, of those relationships. Target Date: 2023-05-24 Frequency: Biweekly  Progress: 0 Modality: individual  Objective Learn and implement relapse prevention skills. Target Date: 2023-05-24 Frequency: Biweekly  Progress: 0 Modality: individual  Related Interventions Identify and rehearse with the client the management of future situations or circumstances in which lapses could occur. Objective Verbalize an understanding of  healthy and unhealthy emotions with the intent of increasing the use of healthy emotions to guide actions. Target Date:  2023-05-24 Frequency: Biweekly  Progress: 0 Modality: individual  Related Interventions Use a process-experiential approach consistent with Emotion-Focused Therapy to create a safe, nurturing environment in which the client can process emotions, learning to identify and regulate unhealthy feelings and to generate more adaptive ones that then guide actions (see Emotion-Focused Therapy for Depression by Peter Minium). 3. Demonstrate improved self-esteem through more pride in appearance, more assertiveness, greater eye contact, and identification of positive traits in self-talk messages. 4. Develop an awareness of how childhood issues have affected and continue to affect one's family life. 5. Develop healthy interpersonal relationships that lead to the alleviation and help prevent the relapse of depression. 6. Develop healthy thinking patterns and beliefs about self, others, and the world that lead to the alleviation and help prevent the relapse of depression. 7. Elevate self-esteem. Objective Demonstrate an increased ability to identify and express personal feelings. Target Date: 2023-05-24 Frequency: Biweekly  Progress: 0 Modality: individual  Related Interventions Assist the client in identifying and labeling emotions. Objective Acknowledge feeling less competent than most others. Target Date: 2023-05-24 Frequency: Biweekly  Progress: 0 Modality: individual  Related Interventions Explore the client's assessment of himself/herself and what is verbalized as the basis for negative self-perception. Actively build the level of trust with the client in individual sessions through consistent eye contact, active listening, unconditional positive regard, and warm acceptance to help increase his/her ability to identify and express feelings. Objective Decrease the frequency of  negative self-descriptive statements and increase frequency of positive self-descriptive statements. Target Date: 2023-05-24 Frequency: Biweekly  Progress: 0 Modality: individual  Related Interventions Assist the client in becoming aware of how he/she expresses or acts out negative feelings about himself/herself. Help the client reframe his/her negative assessment of himself/herself. Assist the client in developing positive self-talk as a way of boosting his/her confidence and self-image (or assign "Positive Self-Talk" in the Adult Psychotherapy Homework Planner by Stephannie Li). Objective Identify and engage in activities that would improve self-image by being consistent with one's values. Target Date: 2023-05-24 Frequency: Biweekly  Progress: 0 Modality: individual  Related Interventions Help the client analyze his/her values and the congruence or incongruence between them and the client's daily activities. Identify and assign activities congruent with the client's values; process them toward improving self-concept and self-esteem. Objective Identify positive traits and talents about self. Target Date: 2023-05-24 Frequency: Biweekly  Progress: 0 Modality: individual  Related Interventions Assign the client the exercise of identifying his/her positive physical characteristics in a mirror to help him/her become more comfortable with himself/herself. Ask the client to keep building a list of positive traits and have him/her read the list at the beginning and end of each session (or assign "Acknowledging My Strengths" or "What Are My Good Qualities?" in the Adult Psychotherapy Homework Planner by [facility]); reinforce the client's positive self-descriptive statements. 8. Enhance ability to effectively cope with the full variety of life's worries and anxieties. 9. Establish an inward sense of self-worth, confidence, and competence. 10. Interact socially without undue distress or disability. 11. Learn  and implement coping skills that result in a reduction of anxiety and worry, and improved daily functioning. 12. Let go of blame and begin to forgive others for pain caused in childhood. Objective Describe what it was like to grow up in the home environment. Target Date: 2023-05-24 Frequency: Biweekly  Progress: 0 Modality: individual  Related Interventions Develop the client's family genogram and/or symptom line and help identify patterns of dysfunction within the family. Objective Increase level of trust  of others as shown by more socialization and greater intimacy tolerance. Target Date: 2023-05-24 Frequency: Biweekly  Progress: 0 Modality: individual  Related Interventions Teach the client the share-check method of building trust in relationships (sharing a little information and checking as to the recipient's sensitivity in reacting to that information). Teach the client the advantages of treating people as trustworthy given a reasonable amount of time to assess their character. Objective Describe each family member and identify the role each played within the family. Target Date: 2023-05-24 Frequency: Biweekly  Progress: 0 Modality: individual  Related Interventions Assist the client in clarifying his/her role within the family and his/her feelings connected to that role. Objective Identify feelings associated with major traumatic incidents in childhood and with parental child-rearing patterns. Target Date: 2023-05-24 Frequency: Biweekly  Progress: 0 Modality: individual  Related Interventions Support and encourage the client when he/she begins to express feelings of rage, sadness, fear, and rejection relating to family abuse or neglect. Assign the client to record feelings in a journal that describes memories, behavior, and emotions tied to his/her traumatic childhood experiences (or assign "How the Trauma Affects Me" in the Adult Psychotherapy Homework Planner by Stephannie Li). Ask the  client to read books on the emotional effects of neglect and abuse in childhood (e.g., It Will Never Happen to Me by [facility]; Outgrowing the Pain by Bronson Curb; Healing the Child Within by [facility]); process insights attained. 13. Reduce overall frequency, intensity, and duration of the anxiety so that daily functioning is not impaired. Objective Reestablish a consistent sleep-wake cycle. Target Date: 2023-05-24 Frequency: Biweekly  Progress: 0 Modality: individual  Related Interventions Teach and implement sleep hygiene practices to help the client reestablish a consistent sleep-wake cycle; review, reinforce success, and provide corrective feedback toward improvement. Objective Identify, challenge, and replace biased, fearful self-talk with positive, realistic, and empowering self-talk. Target Date: 2023-05-24 Frequency: Biweekly  Progress: 0 Modality: individual  Related Interventions Explore the client's schema and self-talk that mediate his/her fear response; assist him/her in challenging the biases; replace the distorted messages with reality-based alternatives and positive, realistic self-talk that will increase his/her self-confidence in coping with irrational fears (see Cognitive Therapy of Anxiety Disorders by Laurence Slate). Assign the client a homework exercise in which he/she identifies fearful self-talk, identifies biases in the self-talk, generates alternatives, and tests through behavioral experiments (or assign "Negative Thoughts Trigger Negative Feelings" in the Adult Psychotherapy Homework Planner by [facility]); review and reinforce success, providing corrective feedback toward improvement. Objective Learn and implement problem-solving strategies for realistically addressing worries. Target Date: 2023-05-24 Frequency: Biweekly  Progress: 0 Modality: individual  Related Interventions Teach the client problem-solving strategies involving specifically defining a problem, generating options  for addressing it, evaluating the pros and cons of each option, selecting and implementing an optional action, and reevaluating and refining the action (or assign "Applying Problem-Solving to Interpersonal Conflict" in the Adult Psychotherapy Homework Planner by Stephannie Li). Objective Learn and implement calming skills to reduce overall anxiety and manage anxiety symptoms. Target Date: 2023-05-24 Frequency: Biweekly  Progress: 0 Modality: individual  Related Interventions Teach the client calming/relaxation skills (e.g., applied relaxation, progressive muscle relaxation, cue controlled relaxation; mindful breathing; biofeedback) and how to discriminate better between relaxation and tension; teach the client how to apply these skills to his/her daily life (e.g., New Directions in Progressive Muscle Relaxation by Marcelyn Ditty, and Hazlett-Stevens; Treating Generalized Anxiety Disorder by Rygh and Ida Rogue). Assign the client to read about progressive muscle relaxation and other calming strategies in relevant books  or treatment manuals (e.g., Progressive Relaxation Training by Twana First; Mastery of Your Anxiety and Worry: Workbook by Earlie Counts). Objective Learn and implement relapse prevention strategies for managing possible future anxiety symptoms. Target Date: 2023-05-24 Frequency: Biweekly  Progress: 0 Modality: individual  Related Interventions Discuss with the client the distinction between a lapse and relapse, associating a lapse with an initial and reversible return of worry, anxiety symptoms, or urges to avoid, and relapse with the decision to continue the fearful and avoidant patterns. Identify and rehearse with the client the management of future situations or circumstances in which lapses could occur. Instruct the client to routinely use new therapeutic skills (e.g., relaxation, cognitive restructuring, exposure, and problem-solving) in daily life to address emergent  worries, anxiety, and avoidant tendencies. Develop a "coping card" on which coping strategies and other important information (e.g., "Breathe deeply and relax," "Challenge unrealistic worries," "Use problem-solving") are written for the client's later use. Objective Identify the major life conflicts from the past and present that form the basis for present anxiety. Target Date: 2023-05-24 Frequency: Biweekly  Progress: 0 Modality: individual  Related Interventions Reinforce the client's insights into the role of his/her past emotional pain and present anxiety. Ask the client to develop and process a list of key past and present life conflicts that continue to cause worry. Assist the client in becoming aware of key unresolved life conflicts and in starting to work toward their resolution. 14. Release the emotions associated with past childhood/family issues, resulting in less resentment and more serenity. 15. Resolve past childhood/family issues, leading to less anger and depression, greater self-esteem, security, and confidence. 16. Resolve the core conflict that is the source of anxiety. 17. Stabilize anxiety level while increasing ability to function on a daily basis.  Diagnosis:Major depressive disorder, recurrent episode, moderate (HCC)  Generalized anxiety disorder  PTSD (post-traumatic stress disorder)  Plan:  -meet again Thursday, Sep 30, 2022 at 12 in person.

## 2022-09-22 NOTE — Addendum Note (Signed)
Addended by: Irven Coe on: 09/22/2022 04:19 PM   Modules accepted: Level of Service

## 2022-09-23 ENCOUNTER — Ambulatory Visit: Payer: BC Managed Care – PPO | Admitting: Professional

## 2022-09-30 ENCOUNTER — Ambulatory Visit: Payer: BC Managed Care – PPO | Admitting: Professional

## 2022-10-04 DIAGNOSIS — R21 Rash and other nonspecific skin eruption: Secondary | ICD-10-CM | POA: Diagnosis not present

## 2022-10-04 DIAGNOSIS — L564 Polymorphous light eruption: Secondary | ICD-10-CM | POA: Diagnosis not present

## 2022-10-05 DIAGNOSIS — M5412 Radiculopathy, cervical region: Secondary | ICD-10-CM | POA: Diagnosis not present

## 2022-10-05 DIAGNOSIS — M5459 Other low back pain: Secondary | ICD-10-CM | POA: Diagnosis not present

## 2022-10-05 DIAGNOSIS — M961 Postlaminectomy syndrome, not elsewhere classified: Secondary | ICD-10-CM | POA: Diagnosis not present

## 2022-10-06 ENCOUNTER — Ambulatory Visit: Payer: BC Managed Care – PPO | Admitting: Neurology

## 2022-10-07 ENCOUNTER — Ambulatory Visit: Payer: BC Managed Care – PPO | Admitting: Neurology

## 2022-10-14 ENCOUNTER — Other Ambulatory Visit: Payer: Self-pay

## 2022-10-14 ENCOUNTER — Ambulatory Visit (INDEPENDENT_AMBULATORY_CARE_PROVIDER_SITE_OTHER): Payer: BC Managed Care – PPO | Admitting: Professional

## 2022-10-14 DIAGNOSIS — F411 Generalized anxiety disorder: Secondary | ICD-10-CM | POA: Diagnosis not present

## 2022-10-14 DIAGNOSIS — K319 Disease of stomach and duodenum, unspecified: Secondary | ICD-10-CM | POA: Insufficient documentation

## 2022-10-14 DIAGNOSIS — D649 Anemia, unspecified: Secondary | ICD-10-CM | POA: Insufficient documentation

## 2022-10-14 DIAGNOSIS — N200 Calculus of kidney: Secondary | ICD-10-CM | POA: Insufficient documentation

## 2022-10-14 DIAGNOSIS — F509 Eating disorder, unspecified: Secondary | ICD-10-CM | POA: Insufficient documentation

## 2022-10-14 DIAGNOSIS — F331 Major depressive disorder, recurrent, moderate: Secondary | ICD-10-CM

## 2022-10-14 DIAGNOSIS — F4310 Post-traumatic stress disorder, unspecified: Secondary | ICD-10-CM | POA: Diagnosis not present

## 2022-10-14 NOTE — Progress Notes (Signed)
[facility] Counselor/Therapist Progress Note  Patient ID: Shannon [name], MRN: [id number],    Date: 10/14/2022  Time Spent: 54 minutes 1203-1257am   Treatment Type: Individual Therapy  Risk Assessment: Danger to Self:  No Self-injurious Behavior: No Danger to Others: No  Subjective: The patient arrived on time for her in-person appointment.   Issues addressed: 1-mother -maintaining her own in placement -pt has informed her sister [name] that she will not agree to bring her mother to her home for care -[name] has still not made decision about whether she will move her mother to [place] -pt concerned for [name]'s physical and mental health should she chose to move her mother 2-anxiety -pt unhappy about lack of relationship her two younger children have with each other -they do not talk and are very critical of the other -pt was confronted by [name] for not spending time with her every other week when she has Matt's daughter [name] -pt admits feeling guilty that [name] is upset -pt feels manipulated by [facility] because she fears he will pull her contact with [name] -pt reminded that her son has no control over her and the relationship she shares with [name]'s mother and her parents -suggested pt consider that her adult children have to manage their own relationships and she does not need to be pulled in  Treatment Plan Problems Addressed  Anxiety, Childhood Trauma, Low Self-Esteem, Unipolar Depression Goals 1. Alleviate depressive symptoms and return to previous level of effective functioning. 2. Appropriately grieve the loss in order to normalize mood and to return to previously adaptive level of functioning. Objective Learn and implement behavioral strategies to overcome depression. Target Date: 2023-05-24 Frequency: Biweekly  Progress: 0 Modality: individual  Related Interventions Engage the client in "behavioral activation," increasing his/her activity level and contact  with sources of reward, while identifying processes that inhibit activation (see Behavioral Activation for Depression by Katharine Look, Dimidjian, and Herman-Dunn; or assign "Identify and Schedule Pleasant Activities" in the Adult Psychotherapy Homework Planner by [facility]); use behavioral techniques such as instruction, rehearsal, role-playing, role reversal, as needed, to facilitate activity in the client's daily life; reinforce success. Assist the client in developing skills that increase the likelihood of deriving pleasure from behavioral activation (e.g., assertiveness skills, developing an exercise plan, less internal/more external focus, increased social involvement); reinforce success. Objective Learn and implement conflict resolution skills to resolve interpersonal problems. Target Date: 2023-05-24 Frequency: Biweekly  Progress: 0 Modality: individual  Related Interventions Teach conflict resolution skills (e.g., empathy, active listening, "I messages," respectful communication, assertiveness without aggression, compromise); use psychoeducation, modeling, role-playing, and rehearsal to work through several current conflicts; assign homework exercises; review and repeat so as to integrate their use into the client's life. Objective Identify important people in life, past and present, and describe the quality, good and poor, of those relationships. Target Date: 2023-05-24 Frequency: Biweekly  Progress: 0 Modality: individual  Objective Learn and implement relapse prevention skills. Target Date: 2023-05-24 Frequency: Biweekly  Progress: 0 Modality: individual  Related Interventions Identify and rehearse with the client the management of future situations or circumstances in which lapses could occur. Objective Verbalize an understanding of healthy and unhealthy emotions with the intent of increasing the use of healthy emotions to guide actions. Target Date: 2023-05-24 Frequency: Biweekly   Progress: 0 Modality: individual  Related Interventions Use a process-experiential approach consistent with Emotion-Focused Therapy to create a safe, nurturing environment in which the client can process emotions, learning to identify and regulate unhealthy feelings and to generate  more adaptive ones that then guide actions (see Emotion-Focused Therapy for Depression by Peter Minium). 3. Demonstrate improved self-esteem through more pride in appearance, more assertiveness, greater eye contact, and identification of positive traits in self-talk messages. 4. Develop an awareness of how childhood issues have affected and continue to affect one's family life. 5. Develop healthy interpersonal relationships that lead to the alleviation and help prevent the relapse of depression. 6. Develop healthy thinking patterns and beliefs about self, others, and the world that lead to the alleviation and help prevent the relapse of depression. 7. Elevate self-esteem. Objective Demonstrate an increased ability to identify and express personal feelings. Target Date: 2023-05-24 Frequency: Biweekly  Progress: 0 Modality: individual  Related Interventions Assist the client in identifying and labeling emotions. Objective Acknowledge feeling less competent than most others. Target Date: 2023-05-24 Frequency: Biweekly  Progress: 0 Modality: individual  Related Interventions Explore the client's assessment of himself/herself and what is verbalized as the basis for negative self-perception. Actively build the level of trust with the client in individual sessions through consistent eye contact, active listening, unconditional positive regard, and warm acceptance to help increase his/her ability to identify and express feelings. Objective Decrease the frequency of negative self-descriptive statements and increase frequency of positive self-descriptive statements. Target Date: 2023-05-24 Frequency: Biweekly   Progress: 0 Modality: individual  Related Interventions Assist the client in becoming aware of how he/she expresses or acts out negative feelings about himself/herself. Help the client reframe his/her negative assessment of himself/herself. Assist the client in developing positive self-talk as a way of boosting his/her confidence and self-image (or assign "Positive Self-Talk" in the Adult Psychotherapy Homework Planner by Stephannie Li). Objective Identify and engage in activities that would improve self-image by being consistent with one's values. Target Date: 2023-05-24 Frequency: Biweekly  Progress: 0 Modality: individual  Related Interventions Help the client analyze his/her values and the congruence or incongruence between them and the client's daily activities. Identify and assign activities congruent with the client's values; process them toward improving self-concept and self-esteem. Objective Identify positive traits and talents about self. Target Date: 2023-05-24 Frequency: Biweekly  Progress: 0 Modality: individual  Related Interventions Assign the client the exercise of identifying his/her positive physical characteristics in a mirror to help him/her become more comfortable with himself/herself. Ask the client to keep building a list of positive traits and have him/her read the list at the beginning and end of each session (or assign "Acknowledging My Strengths" or "What Are My Good Qualities?" in the Adult Psychotherapy Homework Planner by [facility]); reinforce the client's positive self-descriptive statements. 8. Enhance ability to effectively cope with the full variety of life's worries and anxieties. 9. Establish an inward sense of self-worth, confidence, and competence. 10. Interact socially without undue distress or disability. 11. Learn and implement coping skills that result in a reduction of anxiety and worry, and improved daily functioning. 12. Let go of blame and begin to  forgive others for pain caused in childhood. Objective Describe what it was like to grow up in the home environment. Target Date: 2023-05-24 Frequency: Biweekly  Progress: 0 Modality: individual  Related Interventions Develop the client's family genogram and/or symptom line and help identify patterns of dysfunction within the family. Objective Increase level of trust of others as shown by more socialization and greater intimacy tolerance. Target Date: 2023-05-24 Frequency: Biweekly  Progress: 0 Modality: individual  Related Interventions Teach the client the share-check method of building trust in relationships (sharing a little information and checking as  to the recipient's sensitivity in reacting to that information). Teach the client the advantages of treating people as trustworthy given a reasonable amount of time to assess their character. Objective Describe each family member and identify the role each played within the family. Target Date: 2023-05-24 Frequency: Biweekly  Progress: 0 Modality: individual  Related Interventions Assist the client in clarifying his/her role within the family and his/her feelings connected to that role. Objective Identify feelings associated with major traumatic incidents in childhood and with parental child-rearing patterns. Target Date: 2023-05-24 Frequency: Biweekly  Progress: 0 Modality: individual  Related Interventions Support and encourage the client when he/she begins to express feelings of rage, sadness, fear, and rejection relating to family abuse or neglect. Assign the client to record feelings in a journal that describes memories, behavior, and emotions tied to his/her traumatic childhood experiences (or assign "How the Trauma Affects Me" in the Adult Psychotherapy Homework Planner by Stephannie Li). Ask the client to read books on the emotional effects of neglect and abuse in childhood (e.g., It Will Never Happen to Me by [facility]; Outgrowing the  Pain by Bronson Curb; Healing the Child Within by [facility]); process insights attained. 13. Reduce overall frequency, intensity, and duration of the anxiety so that daily functioning is not impaired. Objective Reestablish a consistent sleep-wake cycle. Target Date: 2023-05-24 Frequency: Biweekly  Progress: 0 Modality: individual  Related Interventions Teach and implement sleep hygiene practices to help the client reestablish a consistent sleep-wake cycle; review, reinforce success, and provide corrective feedback toward improvement. Objective Identify, challenge, and replace biased, fearful self-talk with positive, realistic, and empowering self-talk. Target Date: 2023-05-24 Frequency: Biweekly  Progress: 0 Modality: individual  Related Interventions Explore the client's schema and self-talk that mediate his/her fear response; assist him/her in challenging the biases; replace the distorted messages with reality-based alternatives and positive, realistic self-talk that will increase his/her self-confidence in coping with irrational fears (see Cognitive Therapy of Anxiety Disorders by Laurence Slate). Assign the client a homework exercise in which he/she identifies fearful self-talk, identifies biases in the self-talk, generates alternatives, and tests through behavioral experiments (or assign "Negative Thoughts Trigger Negative Feelings" in the Adult Psychotherapy Homework Planner by [facility]); review and reinforce success, providing corrective feedback toward improvement. Objective Learn and implement problem-solving strategies for realistically addressing worries. Target Date: 2023-05-24 Frequency: Biweekly  Progress: 0 Modality: individual  Related Interventions Teach the client problem-solving strategies involving specifically defining a problem, generating options for addressing it, evaluating the pros and cons of each option, selecting and implementing an optional action, and reevaluating and  refining the action (or assign "Applying Problem-Solving to Interpersonal Conflict" in the Adult Psychotherapy Homework Planner by Stephannie Li). Objective Learn and implement calming skills to reduce overall anxiety and manage anxiety symptoms. Target Date: 2023-05-24 Frequency: Biweekly  Progress: 0 Modality: individual  Related Interventions Teach the client calming/relaxation skills (e.g., applied relaxation, progressive muscle relaxation, cue controlled relaxation; mindful breathing; biofeedback) and how to discriminate better between relaxation and tension; teach the client how to apply these skills to his/her daily life (e.g., New Directions in Progressive Muscle Relaxation by Marcelyn Ditty, and Hazlett-Stevens; Treating Generalized Anxiety Disorder by Rygh and Ida Rogue). Assign the client to read about progressive muscle relaxation and other calming strategies in relevant books or treatment manuals (e.g., Progressive Relaxation Training by Robb Matar and Alen Blew; Mastery of Your Anxiety and Worry: Workbook by Earlie Counts). Objective Learn and implement relapse prevention strategies for managing possible future anxiety symptoms. Target Date: 2023-05-24 Frequency: Biweekly  Progress:  0 Modality: individual  Related Interventions Discuss with the client the distinction between a lapse and relapse, associating a lapse with an initial and reversible return of worry, anxiety symptoms, or urges to avoid, and relapse with the decision to continue the fearful and avoidant patterns. Identify and rehearse with the client the management of future situations or circumstances in which lapses could occur. Instruct the client to routinely use new therapeutic skills (e.g., relaxation, cognitive restructuring, exposure, and problem-solving) in daily life to address emergent worries, anxiety, and avoidant tendencies. Develop a "coping card" on which coping strategies and other important information  (e.g., "Breathe deeply and relax," "Challenge unrealistic worries," "Use problem-solving") are written for the client's later use. Objective Identify the major life conflicts from the past and present that form the basis for present anxiety. Target Date: 2023-05-24 Frequency: Biweekly  Progress: 0 Modality: individual  Related Interventions Reinforce the client's insights into the role of his/her past emotional pain and present anxiety. Ask the client to develop and process a list of key past and present life conflicts that continue to cause worry. Assist the client in becoming aware of key unresolved life conflicts and in starting to work toward their resolution. 14. Release the emotions associated with past childhood/family issues, resulting in less resentment and more serenity. 15. Resolve past childhood/family issues, leading to less anger and depression, greater self-esteem, security, and confidence. 16. Resolve the core conflict that is the source of anxiety. 17. Stabilize anxiety level while increasing ability to function on a daily basis.  Diagnosis:Major depressive disorder, recurrent episode, moderate (HCC)  Generalized anxiety disorder  PTSD (post-traumatic stress disorder)  Plan:  -meet again Thursday, October 28, 2022 at 1pm inperson.

## 2022-10-21 ENCOUNTER — Ambulatory Visit (INDEPENDENT_AMBULATORY_CARE_PROVIDER_SITE_OTHER): Payer: BC Managed Care – PPO | Admitting: Professional

## 2022-10-21 ENCOUNTER — Encounter: Payer: Self-pay | Admitting: Professional

## 2022-10-21 DIAGNOSIS — F4310 Post-traumatic stress disorder, unspecified: Secondary | ICD-10-CM | POA: Diagnosis not present

## 2022-10-21 DIAGNOSIS — F411 Generalized anxiety disorder: Secondary | ICD-10-CM | POA: Diagnosis not present

## 2022-10-21 DIAGNOSIS — F331 Major depressive disorder, recurrent, moderate: Secondary | ICD-10-CM

## 2022-10-21 NOTE — Progress Notes (Signed)
[facility] Counselor/Therapist Progress Note  Patient ID: Shannon [name], MRN: [id number],    Date: 10/21/2022  Time Spent: 48 minutes 11-1148am   Treatment Type: Individual Therapy  Risk Assessment: Danger to Self:  No Self-injurious Behavior: No Danger to Others: No  Subjective: The patient arrived on time for her in-person appointment.   Issues addressed: 1-mother -mother's aid got into a disagreement with a staff person at the care facility and is no longer allowed on the premises -pt and her sister were not pleased as aid could always provided information to pt and sister about mother's care -they have since received a new car giver who appear to be performing well -the pt and her sister Shannon [name] have made the decision to remove her from [employer] and to move her in with Shannon [name] in [place] -move will be facilitated once ambulance transfer can be facilitated within the next month -pt admits to mixed feelings about her mother leaving as they have developed a nice relationships -she no longer carries resentment toward her mother for past hurts -she recognizes that she will likely begin to grieve her father's death and she is nervous about this -discussed with pt healthy copings skills   -pt has used alcohol in the past and last evening drank two IPA's to help her relax   -educated pt on other techniques to achieve a similar state of relaxation 2-children -pt continues to fret over problematic relationship her two children share -pt reminded it is out of her control -pt carries guilt that her daughter Shannon [name] has verbalized that she is always put last and that Shannon [name] and his daughter Shannon [name] always get the pt first -pt admits that she doesn't mean for Shannon [name] and Shannon [name] to get more attention, however, she can related to how Shannon [name] feels about this -pt also shared that oldest son Shannon [name] admitted that he has also experienced feeling cheated in his relationship with his mother  due to Shannon [name] -Clinician pt consider sharing her time and that with three children two grandchildren she is likely pulled in many different directions; she admits that she makes little time for herself or her spouse Shannon [name] -consider giving each child 30% of her time if desired and to give each of her grandchildren 5% -she is going to visit Shannon [name] this weekend and plans to ask her what amount of time would feel good to her.  Treatment Plan Problems Addressed  Anxiety, Childhood Trauma, Low Self-Esteem, Unipolar Depression Goals 1. Alleviate depressive symptoms and return to previous level of effective functioning. 2. Appropriately grieve the loss in order to normalize mood and to return to previously adaptive level of functioning. Objective Learn and implement behavioral strategies to overcome depression. Target Date: 2023-05-24 Frequency: Biweekly  Progress: 0 Modality: individual  Related Interventions Engage the client in "behavioral activation," increasing his/her activity level and contact with sources of reward, while identifying processes that inhibit activation (see Behavioral Activation for Depression by Katharine Look, Dimidjian, and Herman-Dunn; or assign "Identify and Schedule Pleasant Activities" in the Adult Psychotherapy Homework Planner by [facility]); use behavioral techniques such as instruction, rehearsal, role-playing, role reversal, as needed, to facilitate activity in the client's daily life; reinforce success. Assist the client in developing skills that increase the likelihood of deriving pleasure from behavioral activation (e.g., assertiveness skills, developing an exercise plan, less internal/more external focus, increased social involvement); reinforce success. Objective Learn and implement conflict resolution skills to resolve interpersonal problems. Target Date: 2023-05-24 Frequency: Biweekly  Progress: 0 Modality: individual  Related Interventions Teach conflict resolution  skills (e.g., empathy, active listening, "I messages," respectful communication, assertiveness without aggression, compromise); use psychoeducation, modeling, role-playing, and rehearsal to work through several current conflicts; assign homework exercises; review and repeat so as to integrate their use into the client's life. Objective Identify important people in life, past and present, and describe the quality, good and poor, of those relationships. Target Date: 2023-05-24 Frequency: Biweekly  Progress: 0 Modality: individual  Objective Learn and implement relapse prevention skills. Target Date: 2023-05-24 Frequency: Biweekly  Progress: 0 Modality: individual  Related Interventions Identify and rehearse with the client the management of future situations or circumstances in which lapses could occur. Objective Verbalize an understanding of healthy and unhealthy emotions with the intent of increasing the use of healthy emotions to guide actions. Target Date: 2023-05-24 Frequency: Biweekly  Progress: 0 Modality: individual  Related Interventions Use a process-experiential approach consistent with Emotion-Focused Therapy to create a safe, nurturing environment in which the client can process emotions, learning to identify and regulate unhealthy feelings and to generate more adaptive ones that then guide actions (see Emotion-Focused Therapy for Depression by Peter Minium). 3. Demonstrate improved self-esteem through more pride in appearance, more assertiveness, greater eye contact, and identification of positive traits in self-talk messages. 4. Develop an awareness of how childhood issues have affected and continue to affect one's family life. 5. Develop healthy interpersonal relationships that lead to the alleviation and help prevent the relapse of depression. 6. Develop healthy thinking patterns and beliefs about self, others, and the world that lead to the alleviation and help prevent the  relapse of depression. 7. Elevate self-esteem. Objective Demonstrate an increased ability to identify and express personal feelings. Target Date: 2023-05-24 Frequency: Biweekly  Progress: 0 Modality: individual  Related Interventions Assist the client in identifying and labeling emotions. Objective Acknowledge feeling less competent than most others. Target Date: 2023-05-24 Frequency: Biweekly  Progress: 0 Modality: individual  Related Interventions Explore the client's assessment of himself/herself and what is verbalized as the basis for negative self-perception. Actively build the level of trust with the client in individual sessions through consistent eye contact, active listening, unconditional positive regard, and warm acceptance to help increase his/her ability to identify and express feelings. Objective Decrease the frequency of negative self-descriptive statements and increase frequency of positive self-descriptive statements. Target Date: 2023-05-24 Frequency: Biweekly  Progress: 0 Modality: individual  Related Interventions Assist the client in becoming aware of how he/she expresses or acts out negative feelings about himself/herself. Help the client reframe his/her negative assessment of himself/herself. Assist the client in developing positive self-talk as a way of boosting his/her confidence and self-image (or assign "Positive Self-Talk" in the Adult Psychotherapy Homework Planner by Stephannie Li). Objective Identify and engage in activities that would improve self-image by being consistent with one's values. Target Date: 2023-05-24 Frequency: Biweekly  Progress: 0 Modality: individual  Related Interventions Help the client analyze his/her values and the congruence or incongruence between them and the client's daily activities. Identify and assign activities congruent with the client's values; process them toward improving self-concept and self-esteem. Objective Identify  positive traits and talents about self. Target Date: 2023-05-24 Frequency: Biweekly  Progress: 0 Modality: individual  Related Interventions Assign the client the exercise of identifying his/her positive physical characteristics in a mirror to help him/her become more comfortable with himself/herself. Ask the client to keep building a list of positive traits and have him/her read the list at the beginning and end of each session (or  assign "Acknowledging My Strengths" or "What Are My Good Qualities?" in the Adult Psychotherapy Homework Planner by [facility]); reinforce the client's positive self-descriptive statements. 8. Enhance ability to effectively cope with the full variety of life's worries and anxieties. 9. Establish an inward sense of self-worth, confidence, and competence. 10. Interact socially without undue distress or disability. 11. Learn and implement coping skills that result in a reduction of anxiety and worry, and improved daily functioning. 12. Let go of blame and begin to forgive others for pain caused in childhood. Objective Describe what it was like to grow up in the home environment. Target Date: 2023-05-24 Frequency: Biweekly  Progress: 0 Modality: individual  Related Interventions Develop the client's family genogram and/or symptom line and help identify patterns of dysfunction within the family. Objective Increase level of trust of others as shown by more socialization and greater intimacy tolerance. Target Date: 2023-05-24 Frequency: Biweekly  Progress: 0 Modality: individual  Related Interventions Teach the client the share-check method of building trust in relationships (sharing a little information and checking as to the recipient's sensitivity in reacting to that information). Teach the client the advantages of treating people as trustworthy given a reasonable amount of time to assess their character. Objective Describe each family member and identify the role each  played within the family. Target Date: 2023-05-24 Frequency: Biweekly  Progress: 0 Modality: individual  Related Interventions Assist the client in clarifying his/her role within the family and his/her feelings connected to that role. Objective Identify feelings associated with major traumatic incidents in childhood and with parental child-rearing patterns. Target Date: 2023-05-24 Frequency: Biweekly  Progress: 0 Modality: individual  Related Interventions Support and encourage the client when he/she begins to express feelings of rage, sadness, fear, and rejection relating to family abuse or neglect. Assign the client to record feelings in a journal that describes memories, behavior, and emotions tied to his/her traumatic childhood experiences (or assign "How the Trauma Affects Me" in the Adult Psychotherapy Homework Planner by Stephannie Li). Ask the client to read books on the emotional effects of neglect and abuse in childhood (e.g., It Will Never Happen to Me by [facility]; Outgrowing the Pain by Bronson Curb; Healing the Child Within by [facility]); process insights attained. 13. Reduce overall frequency, intensity, and duration of the anxiety so that daily functioning is not impaired. Objective Reestablish a consistent sleep-wake cycle. Target Date: 2023-05-24 Frequency: Biweekly  Progress: 0 Modality: individual  Related Interventions Teach and implement sleep hygiene practices to help the client reestablish a consistent sleep-wake cycle; review, reinforce success, and provide corrective feedback toward improvement. Objective Identify, challenge, and replace biased, fearful self-talk with positive, realistic, and empowering self-talk. Target Date: 2023-05-24 Frequency: Biweekly  Progress: 0 Modality: individual  Related Interventions Explore the client's schema and self-talk that mediate his/her fear response; assist him/her in challenging the biases; replace the distorted messages with reality-based  alternatives and positive, realistic self-talk that will increase his/her self-confidence in coping with irrational fears (see Cognitive Therapy of Anxiety Disorders by Laurence Slate). Assign the client a homework exercise in which he/she identifies fearful self-talk, identifies biases in the self-talk, generates alternatives, and tests through behavioral experiments (or assign "Negative Thoughts Trigger Negative Feelings" in the Adult Psychotherapy Homework Planner by [facility]); review and reinforce success, providing corrective feedback toward improvement. Objective Learn and implement problem-solving strategies for realistically addressing worries. Target Date: 2023-05-24 Frequency: Biweekly  Progress: 0 Modality: individual  Related Interventions Teach the client problem-solving strategies involving specifically defining a problem, generating options  for addressing it, evaluating the pros and cons of each option, selecting and implementing an optional action, and reevaluating and refining the action (or assign "Applying Problem-Solving to Interpersonal Conflict" in the Adult Psychotherapy Homework Planner by Stephannie Li). Objective Learn and implement calming skills to reduce overall anxiety and manage anxiety symptoms. Target Date: 2023-05-24 Frequency: Biweekly  Progress: 0 Modality: individual  Related Interventions Teach the client calming/relaxation skills (e.g., applied relaxation, progressive muscle relaxation, cue controlled relaxation; mindful breathing; biofeedback) and how to discriminate better between relaxation and tension; teach the client how to apply these skills to his/her daily life (e.g., New Directions in Progressive Muscle Relaxation by Marcelyn Ditty, and Hazlett-Stevens; Treating Generalized Anxiety Disorder by Rygh and Ida Rogue). Assign the client to read about progressive muscle relaxation and other calming strategies in relevant books or treatment manuals (e.g.,  Progressive Relaxation Training by Robb Matar and Alen Blew; Mastery of Your Anxiety and Worry: Workbook by Earlie Counts). Objective Learn and implement relapse prevention strategies for managing possible future anxiety symptoms. Target Date: 2023-05-24 Frequency: Biweekly  Progress: 0 Modality: individual  Related Interventions Discuss with the client the distinction between a lapse and relapse, associating a lapse with an initial and reversible return of worry, anxiety symptoms, or urges to avoid, and relapse with the decision to continue the fearful and avoidant patterns. Identify and rehearse with the client the management of future situations or circumstances in which lapses could occur. Instruct the client to routinely use new therapeutic skills (e.g., relaxation, cognitive restructuring, exposure, and problem-solving) in daily life to address emergent worries, anxiety, and avoidant tendencies. Develop a "coping card" on which coping strategies and other important information (e.g., "Breathe deeply and relax," "Challenge unrealistic worries," "Use problem-solving") are written for the client's later use. Objective Identify the major life conflicts from the past and present that form the basis for present anxiety. Target Date: 2023-05-24 Frequency: Biweekly  Progress: 0 Modality: individual  Related Interventions Reinforce the client's insights into the role of his/her past emotional pain and present anxiety. Ask the client to develop and process a list of key past and present life conflicts that continue to cause worry. Assist the client in becoming aware of key unresolved life conflicts and in starting to work toward their resolution. 14. Release the emotions associated with past childhood/family issues, resulting in less resentment and more serenity. 15. Resolve past childhood/family issues, leading to less anger and depression, greater self-esteem, security, and confidence. 16.  Resolve the core conflict that is the source of anxiety. 17. Stabilize anxiety level while increasing ability to function on a daily basis.  Diagnosis:Major depressive disorder, recurrent episode, moderate (HCC)  Generalized anxiety disorder  PTSD (post-traumatic stress disorder)  Plan:  -meet again Thursday, October 28, 2022 at 1pm inperson.

## 2022-10-24 ENCOUNTER — Other Ambulatory Visit: Payer: Self-pay | Admitting: Family

## 2022-10-25 ENCOUNTER — Other Ambulatory Visit: Payer: Self-pay | Admitting: Family

## 2022-10-25 DIAGNOSIS — L309 Dermatitis, unspecified: Secondary | ICD-10-CM | POA: Diagnosis not present

## 2022-10-25 DIAGNOSIS — L300 Nummular dermatitis: Secondary | ICD-10-CM | POA: Diagnosis not present

## 2022-10-25 MED ORDER — KLONOPIN 0.5 MG PO TABS: 0.5000 mg | ORAL_TABLET | Freq: Three times a day (TID) | ORAL | 0 refills | Status: DC | PRN

## 2022-10-25 NOTE — Telephone Encounter (Signed)
Requesting: Klonopin 0.5mg   Contract: 07/22/22 UDS: 07/22/22 Last Visit: 07/22/22 Next Visit: None Last Refill: 08/30/22 #90 and 0RF  Please Advise

## 2022-10-25 NOTE — Telephone Encounter (Signed)
Requesting: Klonopin 0.5mg  Contract: 07/22/22 UDS: 07/22/22 Last Visit: 07/22/22 Next Visit: None Last Refill: 08/30/22 #90 and 0RF  Please Advise  

## 2022-10-26 ENCOUNTER — Ambulatory Visit: Payer: BC Managed Care – PPO | Attending: Cardiology | Admitting: Cardiology

## 2022-10-27 DIAGNOSIS — M25552 Pain in left hip: Secondary | ICD-10-CM | POA: Diagnosis not present

## 2022-10-28 ENCOUNTER — Ambulatory Visit (INDEPENDENT_AMBULATORY_CARE_PROVIDER_SITE_OTHER): Payer: BC Managed Care – PPO | Admitting: Professional

## 2022-10-28 ENCOUNTER — Encounter: Payer: Self-pay | Admitting: Professional

## 2022-10-28 DIAGNOSIS — F411 Generalized anxiety disorder: Secondary | ICD-10-CM | POA: Diagnosis not present

## 2022-10-28 DIAGNOSIS — F4310 Post-traumatic stress disorder, unspecified: Secondary | ICD-10-CM | POA: Diagnosis not present

## 2022-10-28 DIAGNOSIS — F331 Major depressive disorder, recurrent, moderate: Secondary | ICD-10-CM

## 2022-10-28 NOTE — Progress Notes (Signed)
[facility] Counselor/Therapist Progress Note  Patient ID: Shannon [name], MRN: [id number],    Date: 10/28/2022  Time Spent: 53 minutes [id number]   Treatment Type: Individual Therapy  Risk Assessment: Danger to Self:  No Self-injurious Behavior: No Danger to Others: No  Subjective: The patient arrived on time for her in-person appointment.   Issues addressed: 1-mother -status quo -on comfort care through Hospice -moving in July to pt's sister [name]'s home in [place] -[facility] to coordinate care with [place] [facility] -pt saw her mother yesterday and she appeared disoriented and did not know the pt 2-daughter [name] -pt did not have opportunity to have conversation related to their relationship -too hot to take walk where they could have some privacy -had a nice visit with [name] and bf of one 14 months Trevor -pt anticipating that Beryle Beams will move in with pt when his lease is up next May -pt unsure of wedding plans -pt tearful as she discussed how important it was for her to participate in planning only daughter's wedding -Clinician brought to pt's attention her use of the word "I" and "me" when the wedding was not hers, but her daughter's -pt admits having the wedding that she and [name] wanted and her mother was not involved -suggested pt consider allowing her daughter to have the wedding of "her dreams", not the wedding of her mother's -[name] did tell the pt that she needed to be available to watch her children when she does have babies -pt was ecstatic -[name] suggested they will move to [place] form [employer] Problems Addressed  Anxiety, Childhood Trauma, Low Self-Esteem, Unipolar Depression Goals 1. Alleviate depressive symptoms and return to previous level of effective functioning. 2. Appropriately grieve the loss in order to normalize mood and to return to previously adaptive level of functioning. Objective Learn and  implement behavioral strategies to overcome depression. Target Date: 2023-05-24 Frequency: Biweekly  Progress: 0 Modality: individual  Related Interventions Engage the client in "behavioral activation," increasing his/her activity level and contact with sources of reward, while identifying processes that inhibit activation (see Behavioral Activation for Depression by Katharine Look, Dimidjian, and Herman-Dunn; or assign "Identify and Schedule Pleasant Activities" in the Adult Psychotherapy Homework Planner by [facility]); use behavioral techniques such as instruction, rehearsal, role-playing, role reversal, as needed, to facilitate activity in the client's daily life; reinforce success. Assist the client in developing skills that increase the likelihood of deriving pleasure from behavioral activation (e.g., assertiveness skills, developing an exercise plan, less internal/more external focus, increased social involvement); reinforce success. Objective Learn and implement conflict resolution skills to resolve interpersonal problems. Target Date: 2023-05-24 Frequency: Biweekly  Progress: 0 Modality: individual  Related Interventions Teach conflict resolution skills (e.g., empathy, active listening, "I messages," respectful communication, assertiveness without aggression, compromise); use psychoeducation, modeling, role-playing, and rehearsal to work through several current conflicts; assign homework exercises; review and repeat so as to integrate their use into the client's life. Objective Identify important people in life, past and present, and describe the quality, good and poor, of those relationships. Target Date: 2023-05-24 Frequency: Biweekly  Progress: 0 Modality: individual  Objective Learn and implement relapse prevention skills. Target Date: 2023-05-24 Frequency: Biweekly  Progress: 0 Modality: individual  Related Interventions Identify and rehearse with the client the management of future  situations or circumstances in which lapses could occur. Objective Verbalize an understanding of healthy and unhealthy emotions with the intent of increasing the use of healthy emotions to guide actions. Target Date: 2023-05-24  Frequency: Biweekly  Progress: 0 Modality: individual  Related Interventions Use a process-experiential approach consistent with Emotion-Focused Therapy to create a safe, nurturing environment in which the client can process emotions, learning to identify and regulate unhealthy feelings and to generate more adaptive ones that then guide actions (see Emotion-Focused Therapy for Depression by Peter Minium). 3. Demonstrate improved self-esteem through more pride in appearance, more assertiveness, greater eye contact, and identification of positive traits in self-talk messages. 4. Develop an awareness of how childhood issues have affected and continue to affect one's family life. 5. Develop healthy interpersonal relationships that lead to the alleviation and help prevent the relapse of depression. 6. Develop healthy thinking patterns and beliefs about self, others, and the world that lead to the alleviation and help prevent the relapse of depression. 7. Elevate self-esteem. Objective Demonstrate an increased ability to identify and express personal feelings. Target Date: 2023-05-24 Frequency: Biweekly  Progress: 0 Modality: individual  Related Interventions Assist the client in identifying and labeling emotions. Objective Acknowledge feeling less competent than most others. Target Date: 2023-05-24 Frequency: Biweekly  Progress: 0 Modality: individual  Related Interventions Explore the client's assessment of himself/herself and what is verbalized as the basis for negative self-perception. Actively build the level of trust with the client in individual sessions through consistent eye contact, active listening, unconditional positive regard, and warm acceptance to  help increase his/her ability to identify and express feelings. Objective Decrease the frequency of negative self-descriptive statements and increase frequency of positive self-descriptive statements. Target Date: 2023-05-24 Frequency: Biweekly  Progress: 0 Modality: individual  Related Interventions Assist the client in becoming aware of how he/she expresses or acts out negative feelings about himself/herself. Help the client reframe his/her negative assessment of himself/herself. Assist the client in developing positive self-talk as a way of boosting his/her confidence and self-image (or assign "Positive Self-Talk" in the Adult Psychotherapy Homework Planner by Stephannie Li). Objective Identify and engage in activities that would improve self-image by being consistent with one's values. Target Date: 2023-05-24 Frequency: Biweekly  Progress: 0 Modality: individual  Related Interventions Help the client analyze his/her values and the congruence or incongruence between them and the client's daily activities. Identify and assign activities congruent with the client's values; process them toward improving self-concept and self-esteem. Objective Identify positive traits and talents about self. Target Date: 2023-05-24 Frequency: Biweekly  Progress: 0 Modality: individual  Related Interventions Assign the client the exercise of identifying his/her positive physical characteristics in a mirror to help him/her become more comfortable with himself/herself. Ask the client to keep building a list of positive traits and have him/her read the list at the beginning and end of each session (or assign "Acknowledging My Strengths" or "What Are My Good Qualities?" in the Adult Psychotherapy Homework Planner by [facility]); reinforce the client's positive self-descriptive statements. 8. Enhance ability to effectively cope with the full variety of life's worries and anxieties. 9. Establish an inward sense of self-worth,  confidence, and competence. 10. Interact socially without undue distress or disability. 11. Learn and implement coping skills that result in a reduction of anxiety and worry, and improved daily functioning. 12. Let go of blame and begin to forgive others for pain caused in childhood. Objective Describe what it was like to grow up in the home environment. Target Date: 2023-05-24 Frequency: Biweekly  Progress: 0 Modality: individual  Related Interventions Develop the client's family genogram and/or symptom line and help identify patterns of dysfunction within the family. Objective Increase level of trust of  others as shown by more socialization and greater intimacy tolerance. Target Date: 2023-05-24 Frequency: Biweekly  Progress: 0 Modality: individual  Related Interventions Teach the client the share-check method of building trust in relationships (sharing a little information and checking as to the recipient's sensitivity in reacting to that information). Teach the client the advantages of treating people as trustworthy given a reasonable amount of time to assess their character. Objective Describe each family member and identify the role each played within the family. Target Date: 2023-05-24 Frequency: Biweekly  Progress: 0 Modality: individual  Related Interventions Assist the client in clarifying his/her role within the family and his/her feelings connected to that role. Objective Identify feelings associated with major traumatic incidents in childhood and with parental child-rearing patterns. Target Date: 2023-05-24 Frequency: Biweekly  Progress: 0 Modality: individual  Related Interventions Support and encourage the client when he/she begins to express feelings of rage, sadness, fear, and rejection relating to family abuse or neglect. Assign the client to record feelings in a journal that describes memories, behavior, and emotions tied to his/her traumatic childhood experiences (or  assign "How the Trauma Affects Me" in the Adult Psychotherapy Homework Planner by Stephannie Li). Ask the client to read books on the emotional effects of neglect and abuse in childhood (e.g., It Will Never Happen to Me by [facility]; Outgrowing the Pain by Bronson Curb; Healing the Child Within by [facility]); process insights attained. 13. Reduce overall frequency, intensity, and duration of the anxiety so that daily functioning is not impaired. Objective Reestablish a consistent sleep-wake cycle. Target Date: 2023-05-24 Frequency: Biweekly  Progress: 0 Modality: individual  Related Interventions Teach and implement sleep hygiene practices to help the client reestablish a consistent sleep-wake cycle; review, reinforce success, and provide corrective feedback toward improvement. Objective Identify, challenge, and replace biased, fearful self-talk with positive, realistic, and empowering self-talk. Target Date: 2023-05-24 Frequency: Biweekly  Progress: 0 Modality: individual  Related Interventions Explore the client's schema and self-talk that mediate his/her fear response; assist him/her in challenging the biases; replace the distorted messages with reality-based alternatives and positive, realistic self-talk that will increase his/her self-confidence in coping with irrational fears (see Cognitive Therapy of Anxiety Disorders by Laurence Slate). Assign the client a homework exercise in which he/she identifies fearful self-talk, identifies biases in the self-talk, generates alternatives, and tests through behavioral experiments (or assign "Negative Thoughts Trigger Negative Feelings" in the Adult Psychotherapy Homework Planner by [facility]); review and reinforce success, providing corrective feedback toward improvement. Objective Learn and implement problem-solving strategies for realistically addressing worries. Target Date: 2023-05-24 Frequency: Biweekly  Progress: 0 Modality: individual  Related Interventions Teach  the client problem-solving strategies involving specifically defining a problem, generating options for addressing it, evaluating the pros and cons of each option, selecting and implementing an optional action, and reevaluating and refining the action (or assign "Applying Problem-Solving to Interpersonal Conflict" in the Adult Psychotherapy Homework Planner by Stephannie Li). Objective Learn and implement calming skills to reduce overall anxiety and manage anxiety symptoms. Target Date: 2023-05-24 Frequency: Biweekly  Progress: 0 Modality: individual  Related Interventions Teach the client calming/relaxation skills (e.g., applied relaxation, progressive muscle relaxation, cue controlled relaxation; mindful breathing; biofeedback) and how to discriminate better between relaxation and tension; teach the client how to apply these skills to his/her daily life (e.g., New Directions in Progressive Muscle Relaxation by Marcelyn Ditty, and Hazlett-Stevens; Treating Generalized Anxiety Disorder by Rygh and Ida Rogue). Assign the client to read about progressive muscle relaxation and other calming strategies in relevant books or  treatment manuals (e.g., Progressive Relaxation Training by Twana First; Mastery of Your Anxiety and Worry: Workbook by Earlie Counts). Objective Learn and implement relapse prevention strategies for managing possible future anxiety symptoms. Target Date: 2023-05-24 Frequency: Biweekly  Progress: 0 Modality: individual  Related Interventions Discuss with the client the distinction between a lapse and relapse, associating a lapse with an initial and reversible return of worry, anxiety symptoms, or urges to avoid, and relapse with the decision to continue the fearful and avoidant patterns. Identify and rehearse with the client the management of future situations or circumstances in which lapses could occur. Instruct the client to routinely use new therapeutic skills (e.g.,  relaxation, cognitive restructuring, exposure, and problem-solving) in daily life to address emergent worries, anxiety, and avoidant tendencies. Develop a "coping card" on which coping strategies and other important information (e.g., "Breathe deeply and relax," "Challenge unrealistic worries," "Use problem-solving") are written for the client's later use. Objective Identify the major life conflicts from the past and present that form the basis for present anxiety. Target Date: 2023-05-24 Frequency: Biweekly  Progress: 0 Modality: individual  Related Interventions Reinforce the client's insights into the role of his/her past emotional pain and present anxiety. Ask the client to develop and process a list of key past and present life conflicts that continue to cause worry. Assist the client in becoming aware of key unresolved life conflicts and in starting to work toward their resolution. 14. Release the emotions associated with past childhood/family issues, resulting in less resentment and more serenity. 15. Resolve past childhood/family issues, leading to less anger and depression, greater self-esteem, security, and confidence. 16. Resolve the core conflict that is the source of anxiety. 17. Stabilize anxiety level while increasing ability to function on a daily basis.  Diagnosis:Major depressive disorder, recurrent episode, moderate (HCC)  Generalized anxiety disorder  PTSD (post-traumatic stress disorder)  Plan:  -meet again Wednesday, November 10, 2022 at 11am virtually.

## 2022-11-01 DIAGNOSIS — L209 Atopic dermatitis, unspecified: Secondary | ICD-10-CM | POA: Diagnosis not present

## 2022-11-02 ENCOUNTER — Ambulatory Visit: Payer: BC Managed Care – PPO | Admitting: Professional

## 2022-11-02 LAB — LAB REPORT - SCANNED: EGFR: 81

## 2022-11-08 DIAGNOSIS — L309 Dermatitis, unspecified: Secondary | ICD-10-CM | POA: Diagnosis not present

## 2022-11-10 ENCOUNTER — Ambulatory Visit: Payer: BC Managed Care – PPO | Admitting: Professional

## 2022-11-22 ENCOUNTER — Ambulatory Visit (INDEPENDENT_AMBULATORY_CARE_PROVIDER_SITE_OTHER): Payer: BC Managed Care – PPO | Admitting: Family

## 2022-11-22 ENCOUNTER — Encounter: Payer: Self-pay | Admitting: Family

## 2022-11-22 VITALS — BP 103/68 | HR 73 | Temp 97.5°F | Resp 16 | Wt 113.4 lb

## 2022-11-22 DIAGNOSIS — E0590 Thyrotoxicosis, unspecified without thyrotoxic crisis or storm: Secondary | ICD-10-CM

## 2022-11-22 DIAGNOSIS — R233 Spontaneous ecchymoses: Secondary | ICD-10-CM | POA: Diagnosis not present

## 2022-11-22 DIAGNOSIS — F410 Panic disorder [episodic paroxysmal anxiety] without agoraphobia: Secondary | ICD-10-CM

## 2022-11-22 DIAGNOSIS — F411 Generalized anxiety disorder: Secondary | ICD-10-CM | POA: Diagnosis not present

## 2022-11-22 DIAGNOSIS — R21 Rash and other nonspecific skin eruption: Secondary | ICD-10-CM

## 2022-11-22 LAB — TSH: TSH: 1.2 u[IU]/mL (ref 0.35–5.50)

## 2022-11-22 LAB — T3, FREE: T3, Free: 2.9 pg/mL (ref 2.3–4.2)

## 2022-11-22 LAB — T4, FREE: Free T4: 0.88 ng/dL (ref 0.60–1.60)

## 2022-11-22 MED ORDER — KLONOPIN 0.5 MG PO TABS: 0.5000 mg | ORAL_TABLET | Freq: Three times a day (TID) | ORAL | 0 refills | Status: DC | PRN

## 2022-11-22 NOTE — Assessment & Plan Note (Signed)
Slightly low TSH noted on recent blood work (0.4). Patient reports palpitations, but attributes them to panic attacks. Thyroid appears slightly enlarged on physical examination. -Repeat TSH and add more specific thyroid tests. -Order thyroid ultrasound to evaluate for possible nodule.

## 2022-11-22 NOTE — Progress Notes (Signed)
Subjective:     Patient ID: Shannon [name], female    DOB: 04-06-1961, 62 y.o.   MRN: [id number]  Chief Complaint  Patient presents with   Rash    Patient complains of skin rash    Hypothyroidism    Low TSH 11/01/22    Rash    Discussed the use of AI scribe software for clinical note transcription with the patient, who gave verbal consent to proceed.  History of Present Illness   The patient, with a history of anxiety, presents with a recurrent rash that started in May. The rash initially appeared on the arms, chest, and neck. The patient initially thought it was poison ivy, but a dermatologist ruled this out. The rash was severe enough to cause the patient to scratch their skin. The dermatologist treated the rash with a cortisone cream, cortisone shot, and prednisone, which initially resolved the rash. However, the rash returned two weeks later, once the steroids wore off. A skin biopsy ruled out skin cancer. The rash has since come and gone. r. The patient also reports bruising, but denies taking any medications with aspirin due to stomach issues.  The patient also reports symptoms of palpitations, diarrhea, and weight loss, which they attribute to anxiety and panic attacks. They are currently on Klonopin for anxiety, and have been for a long time. They recently switched to the name brand from the generic. They report that the medication is still working, but they have had a couple of severe panic attacks in the last month.          Health Maintenance Due  Topic Date Due   Zoster Vaccines- Shingrix (1 of 2) Never done    Past Medical History:  Diagnosis Date   Anemia    Atypical chest pain 03/08/2012   Bilateral thumb pain 01/12/2018   Bilateral wrist pain 01/12/2018   Cervical spondylosis without myelopathy 02/12/2016   Chronic midline posterior neck pain 06/19/2015   Concussion without loss of consciousness 06/18/2017   Constipation 01/14/2021   Ecchymosis  05/25/2011   Family history of colonic polyps 12/26/2017   Generalized anxiety disorder with panic attacks 12/30/2010   Grief reaction 02/03/2022   History of eating disorder    anorexia nervosa in her 24-Dec-2022   Kidney stones    passed on their own   Laceration of left thumb 01/14/2021   Low back pain 03/03/2020   Lumbar radicular pain 06/19/2015   Major depressive disorder 12/23/2012   Mild neurocognitive disorder due to another medical condition 10/20/2017   Mixed obsessional thoughts and acts 03/09/2017   Osteoarthritis of carpometacarpal (CMC) joint of thumb 01/10/2020   Postlaminectomy syndrome, lumbar region 06/19/2015   Preventative health care 02/15/2012   Raynaud's disease 03/23/2013   Recurrent major depressive disorder, in partial remission (HCC) 03/09/2017   Sacroiliac dysfunction 06/19/2015   Spinal stenosis of lumbar region with radiculopathy 04/15/2022   Spondylosis of lumbar region without myelopathy or radiculopathy 07/15/2015   Stomach disorder    due to complications with cholecystectomy "almost died"    Past Surgical History:  Procedure Laterality Date   APPENDECTOMY  1974-12-24   BREAST BIOPSY Right    CHOLECYSTECTOMY  08/2009   reports history of gallbladder polyps, she reports stents in duct of lushca    COLONOSCOPY  12/23/20   HYSTEROSCOPY  07/2011   LAMINECTOMY  1994   L4-5   LUMBAR LAMINECTOMY/DECOMPRESSION MICRODISCECTOMY N/A 04/15/2022   Procedure: LUMBAR THREE-FOUR LUMBAR LAMINECTOMY;  Surgeon: Jene Every,  MD;  Location: [facility] OR;  Service: Orthopedics;  Laterality: N/A;  2 hrs 3 C-Bed   TONSILLECTOMY  1982   TUBAL LIGATION      Family History  Problem Relation Age of Onset   Hypertension Mother    Memory loss Mother    Dementia Mother    Alcoholism Mother    Diabetes Father    Hypertension Father    Hypothyroidism Father    CAD Father    Stroke Father    Adrenal disorder Father    Adrenal disorder Sister    Hashimoto's thyroiditis Sister     Hypothyroidism Sister    Raynaud syndrome Sister    Neuropathy Sister        chronic inflammatory demyelinating peripheral neuropathy from lyme disease   Alcohol abuse Maternal Grandmother    Cancer Paternal Grandfather        lung cancer   Hypothyroidism Other     Social History   Socioeconomic History   Marital status: Married    Spouse name: Not on file   Number of children: 3   Years of education: 15   Highest education level: Associate degree: academic program  Occupational History    Employer: SELF EMPLOYED  Tobacco Use   Smoking status: Never   Smokeless tobacco: Never  Vaping Use   Vaping status: Never Used  Substance and Sexual Activity   Alcohol use: Yes    Comment: occ.   Drug use: Not Currently   Sexual activity: Yes    Partners: Male    Birth control/protection: Surgical  Other Topics Concern   Not on file  Social History Narrative   Regular exercise:  Yes (swim instructor)   Caffeine Use:  1 daily   Married 3 children ages 63 son, 69 son, and 15 daughter   Associates degree   Right handed          Social Determinants of Health   Financial Resource Strain: Not on file  Food Insecurity: Not on file  Transportation Needs: Not on file  Physical Activity: Not on file  Stress: Not on file  Social Connections: Not on file  Intimate Partner Violence: Not on file    Outpatient Medications Prior to Visit  Medication Sig Dispense Refill   buPROPion (WELLBUTRIN XL) 300 MG 24 hr tablet Take 1 tablet (300 mg total) by mouth daily. 90 tablet 1   busPIRone (BUSPAR) 15 MG tablet TAKE 1 TABLET BY MOUTH TWICE A DAY 180 tablet 1   CHOLINE PO Take by mouth.     Coenzyme Q10 (COQ10) 100 MG CAPS Take 100 mg by mouth daily.     cyanocobalamin (VITAMIN B12) 500 MCG tablet Take 500 mcg by mouth daily.     Gamma-Aminobutyric Acid (GABA PO) Take 500 mg by mouth at bedtime.     Gamma-Aminobutyric Acid (GABA PO) Take by mouth.     lidocaine (LIDODERM) 5 % Place 1 patch  onto the skin daily as needed (pain). Remove & Discard patch within 12 hours or as directed by MD     LINZESS 290 MCG CAPS capsule Take 290 mcg by mouth daily.     MAGNESIUM GLYCINATE PO Take 120 mg by mouth at bedtime.     melatonin 3 MG TABS tablet Take 3 mg by mouth at bedtime.     Multiple Vitamin (MULTIVITAMIN) tablet Take 1 tablet by mouth daily.     OVER THE COUNTER MEDICATION Take 1 capsule by mouth daily. Brain Elevate  polyethylene glycol (MIRALAX / GLYCOLAX) 17 g packet Take 17 g by mouth daily. 14 each 0   pregabalin (LYRICA) 50 MG capsule Take 50 mg by mouth 2 (two) times daily.     Turmeric (QC TUMERIC COMPLEX PO) Take by mouth.     KLONOPIN 0.5 MG tablet Take 1 tablet (0.5 mg total) by mouth 3 (three) times daily as needed. for anxiety 90 tablet 0   No facility-administered medications prior to visit.    Allergies  Allergen Reactions   Buprenorphine Hcl     No relief   Cortisporin [Neomycin-Polymyxin-Hc] Swelling    Ear dropps   Eszopiclone     Headaches     Flexeril [Cyclobenzaprine Hcl] Other (See Comments)    Mood swings   Gabapentin     sedation   Morphine And Codeine Other (See Comments)    No relief   Nsaids Other (See Comments)    GI bleed   Paroxetine Nausea Only        Shellfish Allergy Other (See Comments)    skin & mouth tingle   Tizanidine Hcl Other (See Comments)    Dizziness, mood swings   Tolmetin     GI bleed    Review of Systems  Skin:  Positive for rash.   See HPI    Objective:    Physical Exam Constitutional:      General: She is not in acute distress.    Appearance: Normal appearance. She is well-developed.  HENT:     Head: Normocephalic and atraumatic.     Right Ear: External ear normal.     Left Ear: External ear normal.  Eyes:     General: No scleral icterus. Neck:     Thyroid: Thyromegaly (mildly asymmetric R>L, thyroid fullness noted) present.  Cardiovascular:     Rate and Rhythm: Normal rate and regular rhythm.      Heart sounds: Normal heart sounds. No murmur heard. Pulmonary:     Effort: Pulmonary effort is normal. No respiratory distress.     Breath sounds: Normal breath sounds. No wheezing.  Musculoskeletal:     Cervical back: Neck supple.  Skin:    General: Skin is warm and dry.     Comments: Sun tanned, no rash noted.  Some bruising noted on right thigh  Neurological:     Mental Status: She is alert and oriented to person, place, and time.  Psychiatric:        Mood and Affect: Mood normal.        Behavior: Behavior normal.        Thought Content: Thought content normal.        Judgment: Judgment normal.      BP 103/68 (BP Location: Right Arm, Patient Position: Sitting, Cuff Size: Small)   Pulse 73   Temp (!) 97.5 F (36.4 C) (Oral)   Resp 16   Wt 113 lb 6.4 oz (51.4 kg)   LMP 03/19/2017   SpO2 100%   BMI 18.87 kg/m  Wt Readings from Last 3 Encounters:  11/22/22 113 lb 6.4 oz (51.4 kg)  07/22/22 112 lb 9.6 oz (51.1 kg)  04/15/22 113 lb (51.3 kg)       Assessment & Plan:   Problem List Items Addressed This Visit       Unprioritized   Skin rash - Primary    Started in May, initially treated as poison ivy with corticosteroids. Rash resolved but recurred after cessation of steroids. Biopsy performed by dermatologist,  not cancerous. No identified changes in environment or diet. -Refer to allergist for further evaluation and possible skin testing.      Relevant Orders   Ambulatory referral to Allergy   Hyperthyroidism    Slightly low TSH noted on recent blood work (0.4). Patient reports palpitations, but attributes them to panic attacks. Thyroid appears slightly enlarged on physical examination. -Repeat TSH and add more specific thyroid tests. -Order thyroid ultrasound to evaluate for possible nodule.       Relevant Orders   TSH   T3, free   T4, free   US THYROID   Generalized anxiety disorder with panic attacks    Patient on Klonopin, recently switched to brand  name. Reports a few severe episodes in the past month, but believes they are situational. -Continue Klonopin as currently prescribed.       Easy bruising    Patient reports increased bruising. No aspirin use. Recent blood count and platelets normal. -No change in management, likely due to sensitive skin.       I am having Tyson Alias maintain her Linzess, melatonin, Gamma-Aminobutyric Acid (GABA PO), MAGNESIUM GLYCINATE PO, multivitamin, OVER THE COUNTER MEDICATION, CoQ10, cyanocobalamin, lidocaine, polyethylene glycol, busPIRone, buPROPion, pregabalin, Turmeric (QC TUMERIC COMPLEX PO), Gamma-Aminobutyric Acid (GABA PO), CHOLINE PO, and KlonoPIN.  Meds ordered this encounter  Medications   KLONOPIN 0.5 MG tablet    Sig: Take 1 tablet (0.5 mg total) by mouth 3 (three) times daily as needed. for anxiety    Dispense:  90 tablet    Refill:  0    Brand name only    Order Specific Question:   Supervising Provider    Answer:   Danise Edge A [[id number]]

## 2022-11-22 NOTE — Assessment & Plan Note (Signed)
Patient reports increased bruising. No aspirin use. Recent blood count and platelets normal. -No change in management, likely due to sensitive skin.

## 2022-11-22 NOTE — Assessment & Plan Note (Signed)
Started in May, initially treated as poison ivy with corticosteroids. Rash resolved but recurred after cessation of steroids. Biopsy performed by dermatologist, not cancerous. No identified changes in environment or diet. -Refer to allergist for further evaluation and possible skin testing.

## 2022-11-22 NOTE — Assessment & Plan Note (Signed)
Patient on Klonopin, recently switched to brand name. Reports a few severe episodes in the past month, but believes they are situational. -Continue Klonopin as currently prescribed.

## 2022-11-22 NOTE — Patient Instructions (Signed)
VISIT SUMMARY:  During your visit, we discussed your recurrent rash, anxiety, and recent symptoms of palpitations, diarrhea, and weight loss. We also noted your concern about increased bruising. We have made several plans to address these issues and ensure your health is well-managed.  YOUR PLAN:  -RECURRENT RASH: Your rash, which started in May and has been coming and going, is not related to poison ivy or skin cancer. We will refer you to an allergist for further evaluation and possible skin testing to identify the cause.  -POSSIBLE HYPERTHYROIDISM: Your recent blood work showed a low TSH level, which could indicate an overactive thyroid (hyperthyroidism). This could be causing your palpitations, diarrhea, and weight loss. We will repeat the TSH test, add more specific thyroid tests, and order a thyroid ultrasound to check for any abnormalities.  -ANXIETY: You are currently on Klonopin for your anxiety, which you believe is still effective despite a few severe episodes recently. We will continue with this medication as currently prescribed.  -BRUISING: You've noticed increased bruising, but your recent blood count and platelets are normal. We believe this is likely due to sensitive skin and will not change your current management.  INSTRUCTIONS:  Please follow up in 6 months, or sooner if your thyroid test results warrant further intervention. In the meantime, please reach out if you have any concerns or if your symptoms worsen.

## 2022-11-23 ENCOUNTER — Ambulatory Visit (HOSPITAL_BASED_OUTPATIENT_CLINIC_OR_DEPARTMENT_OTHER): Admission: RE | Admit: 2022-11-23 | Payer: BC Managed Care – PPO | Source: Ambulatory Visit

## 2022-11-25 ENCOUNTER — Ambulatory Visit (INDEPENDENT_AMBULATORY_CARE_PROVIDER_SITE_OTHER): Payer: BC Managed Care – PPO | Admitting: Professional

## 2022-11-25 DIAGNOSIS — F411 Generalized anxiety disorder: Secondary | ICD-10-CM

## 2022-11-25 DIAGNOSIS — F4310 Post-traumatic stress disorder, unspecified: Secondary | ICD-10-CM | POA: Diagnosis not present

## 2022-11-25 DIAGNOSIS — F331 Major depressive disorder, recurrent, moderate: Secondary | ICD-10-CM

## 2022-11-26 NOTE — Progress Notes (Unsigned)
° ° ° ° ° ° ° ° ° ° ° ° ° ° °  Kathy Collins, LCMHC °

## 2022-11-29 ENCOUNTER — Ambulatory Visit (HOSPITAL_BASED_OUTPATIENT_CLINIC_OR_DEPARTMENT_OTHER)
Admission: RE | Admit: 2022-11-29 | Discharge: 2022-11-29 | Disposition: A | Discharge status: Home / Self Care | Payer: BC Managed Care – PPO | Source: Ambulatory Visit | Attending: Family | Admitting: Family

## 2022-11-29 DIAGNOSIS — E0590 Thyrotoxicosis, unspecified without thyrotoxic crisis or storm: Secondary | ICD-10-CM | POA: Insufficient documentation

## 2022-11-29 DIAGNOSIS — E010 Iodine-deficiency related diffuse (endemic) goiter: Secondary | ICD-10-CM | POA: Diagnosis not present

## 2022-11-30 ENCOUNTER — Encounter: Payer: Self-pay | Admitting: Professional

## 2022-12-02 ENCOUNTER — Ambulatory Visit: Payer: BC Managed Care – PPO | Admitting: Professional

## 2022-12-03 ENCOUNTER — Other Ambulatory Visit: Payer: Self-pay | Admitting: Family

## 2022-12-09 ENCOUNTER — Ambulatory Visit: Payer: BC Managed Care – PPO | Admitting: Professional

## 2022-12-14 ENCOUNTER — Telehealth: Payer: Self-pay | Admitting: Family

## 2022-12-14 MED ORDER — KLONOPIN 0.5 MG PO TABS: 0.5000 mg | ORAL_TABLET | Freq: Three times a day (TID) | ORAL | 0 refills | Status: DC | PRN

## 2022-12-14 NOTE — Telephone Encounter (Signed)
Pt said she keeps getting notifications from Mychart and wanted to make sure she wasn't missing anything important since she cannot get into mychart. Asked patient if she received all of her recent lab/imaging results and she confirmed she had. Advised that would be the only thing besides the actual office visit note from 11/22/22. Advised her Earleen Reaper will keep sending that notification until she opens it even if she got the results from other means. Pt understands   Pt is currently in [place] (her mom is dying)and she expects to be up there at least 2 weeks more and she said her Klonopin will be due around 8/23. Pt would like refill to be sent to [facility] [address] in [place], [place] #[phone]. She will try to remember to call ahead a couple of days to request the refill but wanted to see if we can send it in for her. Please call her to advise.

## 2022-12-14 NOTE — Telephone Encounter (Signed)
Called but no answer, lvm for patient to be aware

## 2022-12-14 NOTE — Addendum Note (Signed)
Addended by: Sandford Craze on: 12/14/2022 02:41 PM   Modules accepted: Orders

## 2022-12-14 NOTE — Addendum Note (Signed)
Addended by: Sandford Craze on: 12/14/2022 02:43 PM   Modules accepted: Orders

## 2022-12-14 NOTE — Telephone Encounter (Addendum)
Please advise pt that I [facility] [place] with instructions not to fill until 8/23.

## 2022-12-20 MED ORDER — KLONOPIN 0.5 MG PO TABS: 0.5000 mg | ORAL_TABLET | Freq: Three times a day (TID) | ORAL | 0 refills | Status: DC | PRN

## 2022-12-20 NOTE — Telephone Encounter (Signed)
Pt called stating that her mother has passed and will be returning 8.20.24. Pt will not be able to pick up her medication due to the timing and needs to have it transferred to the following pharmacy:  [facility] [id number] - [place], [place] - [address], [place] [address] Phone: [phone]  Fax: [phone]

## 2022-12-20 NOTE — Telephone Encounter (Signed)
Rx sent to [facility] and I left detailed message for pharmacy in [facility] requesting cancellation of the rx for klonopin that I sent to them.

## 2022-12-20 NOTE — Addendum Note (Signed)
Addended by: Sandford Craze on: 12/20/2022 03:06 PM   Modules accepted: Orders

## 2022-12-22 ENCOUNTER — Ambulatory Visit: Payer: BC Managed Care – PPO | Admitting: Professional

## 2022-12-23 ENCOUNTER — Ambulatory Visit: Payer: BC Managed Care – PPO | Admitting: Professional

## 2022-12-29 DIAGNOSIS — M25551 Pain in right hip: Secondary | ICD-10-CM | POA: Diagnosis not present

## 2023-01-06 ENCOUNTER — Ambulatory Visit: Payer: BC Managed Care – PPO | Admitting: Professional

## 2023-01-06 ENCOUNTER — Other Ambulatory Visit: Payer: Self-pay | Admitting: Family

## 2023-01-06 ENCOUNTER — Encounter: Payer: Self-pay | Admitting: Professional

## 2023-01-06 DIAGNOSIS — F411 Generalized anxiety disorder: Secondary | ICD-10-CM

## 2023-01-06 DIAGNOSIS — F331 Major depressive disorder, recurrent, moderate: Secondary | ICD-10-CM

## 2023-01-06 DIAGNOSIS — F4310 Post-traumatic stress disorder, unspecified: Secondary | ICD-10-CM

## 2023-01-06 NOTE — Progress Notes (Signed)
[name] Counselor/Therapist Progress Note  Patient ID: Shannon [name], MRN: [id number],    Date: 01/06/2023  Time Spent: 61 minutes [id number]   Treatment Type: Individual Therapy  Risk Assessment: Danger to Self:  No Self-injurious Behavior: No Danger to Others: No  Subjective: The patient arrived on time for her in-person appointment.   Issues addressed: 1-mother a-passed away at sister [name]'s home in [place] b-pt was able to get to [place] two days before her death -pt feels at peace with her passing -she did not request a kiss from her mother as her sister [name] and Aunt die before she became unconscious -pt was fearful her mother would withhold the kiss -mother's death one week before the one year anniversary of her father's death -pt and sister able to make light of some things with mom's passing -pt is thankful her mother is no longer suffering -pt feels lost because she has nothing that must be managed since her death -her mother's funeral was in [place] and burial will be tomorrow at the [facility] she will share with her spouse 2-father -pt has been depressed and on the sofa since her return two weeks ago from [place] -pt tearfully shares the depth of loss she feels for her father -she admits that she has not been able to grieve him given that she had to care for her mother's needs -pt hopes to be able to hold father's urn tomorrow when the [facility] is opened for her mother's placement -pt plans to ask for a few moments with her parents by herself or with her sister if she desires -pt concerned if something is wrong since she is still grieving father's death -pt was assured that each person is free to grieve in their own way in their own time -pt reminded that she is dealing with father's one year anniversary, her inability to grieve over the past year, her mother's death, and her sense of purpose since her mother is deceased  Treatment Plan Problems Addressed   Anxiety, Childhood Trauma, Low Self-Esteem, Unipolar Depression Goals 1. Alleviate depressive symptoms and return to previous level of effective functioning. 2. Appropriately grieve the loss in order to normalize mood and to return to previously adaptive level of functioning. Objective Learn and implement behavioral strategies to overcome depression. Target Date: 2023-05-24 Frequency: Biweekly  Progress: 0 Modality: individual  Related Interventions Engage the client in "behavioral activation," increasing his/her activity level and contact with sources of reward, while identifying processes that inhibit activation (see Behavioral Activation for Depression by Katharine Look, Dimidjian, and Herman-Dunn; or assign "Identify and Schedule Pleasant Activities" in the Adult Psychotherapy Homework Planner by [facility]); use behavioral techniques such as instruction, rehearsal, role-playing, role reversal, as needed, to facilitate activity in the client's daily life; reinforce success. Assist the client in developing skills that increase the likelihood of deriving pleasure from behavioral activation (e.g., assertiveness skills, developing an exercise plan, less internal/more external focus, increased social involvement); reinforce success. Objective Learn and implement conflict resolution skills to resolve interpersonal problems. Target Date: 2023-05-24 Frequency: Biweekly  Progress: 0 Modality: individual  Related Interventions Teach conflict resolution skills (e.g., empathy, active listening, "I messages," respectful communication, assertiveness without aggression, compromise); use psychoeducation, modeling, role-playing, and rehearsal to work through several current conflicts; assign homework exercises; review and repeat so as to integrate their use into the client's life. Objective Identify important people in life, past and present, and describe the quality, good and poor, of those relationships. Target  Date: 2023-05-24 Frequency: Biweekly  Progress: 0 Modality: individual  Objective Learn and implement relapse prevention skills. Target Date: 2023-05-24 Frequency: Biweekly  Progress: 0 Modality: individual  Related Interventions Identify and rehearse with the client the management of future situations or circumstances in which lapses could occur. Objective Verbalize an understanding of healthy and unhealthy emotions with the intent of increasing the use of healthy emotions to guide actions. Target Date: 2023-05-24 Frequency: Biweekly  Progress: 0 Modality: individual  Related Interventions Use a process-experiential approach consistent with Emotion-Focused Therapy to create a safe, nurturing environment in which the client can process emotions, learning to identify and regulate unhealthy feelings and to generate more adaptive ones that then guide actions (see Emotion-Focused Therapy for Depression by Peter Minium). 3. Demonstrate improved self-esteem through more pride in appearance, more assertiveness, greater eye contact, and identification of positive traits in self-talk messages. 4. Develop an awareness of how childhood issues have affected and continue to affect one's family life. 5. Develop healthy interpersonal relationships that lead to the alleviation and help prevent the relapse of depression. 6. Develop healthy thinking patterns and beliefs about self, others, and the world that lead to the alleviation and help prevent the relapse of depression. 7. Elevate self-esteem. Objective Demonstrate an increased ability to identify and express personal feelings. Target Date: 2023-05-24 Frequency: Biweekly  Progress: 0 Modality: individual  Related Interventions Assist the client in identifying and labeling emotions. Objective Acknowledge feeling less competent than most others. Target Date: 2023-05-24 Frequency: Biweekly  Progress: 0 Modality: individual  Related  Interventions Explore the client's assessment of himself/herself and what is verbalized as the basis for negative self-perception. Actively build the level of trust with the client in individual sessions through consistent eye contact, active listening, unconditional positive regard, and warm acceptance to help increase his/her ability to identify and express feelings. Objective Decrease the frequency of negative self-descriptive statements and increase frequency of positive self-descriptive statements. Target Date: 2023-05-24 Frequency: Biweekly  Progress: 0 Modality: individual  Related Interventions Assist the client in becoming aware of how he/she expresses or acts out negative feelings about himself/herself. Help the client reframe his/her negative assessment of himself/herself. Assist the client in developing positive self-talk as a way of boosting his/her confidence and self-image (or assign "Positive Self-Talk" in the Adult Psychotherapy Homework Planner by Stephannie Li). Objective Identify and engage in activities that would improve self-image by being consistent with one's values. Target Date: 2023-05-24 Frequency: Biweekly  Progress: 0 Modality: individual  Related Interventions Help the client analyze his/her values and the congruence or incongruence between them and the client's daily activities. Identify and assign activities congruent with the client's values; process them toward improving self-concept and self-esteem. Objective Identify positive traits and talents about self. Target Date: 2023-05-24 Frequency: Biweekly  Progress: 0 Modality: individual  Related Interventions Assign the client the exercise of identifying his/her positive physical characteristics in a mirror to help him/her become more comfortable with himself/herself. Ask the client to keep building a list of positive traits and have him/her read the list at the beginning and end of each session (or assign  "Acknowledging My Strengths" or "What Are My Good Qualities?" in the Adult Psychotherapy Homework Planner by [facility]); reinforce the client's positive self-descriptive statements. 8. Enhance ability to effectively cope with the full variety of life's worries and anxieties. 9. Establish an inward sense of self-worth, confidence, and competence. 10. Interact socially without undue distress or disability. 11. Learn and implement coping skills that result in a reduction of anxiety and worry,  and improved daily functioning. 12. Let go of blame and begin to forgive others for pain caused in childhood. Objective Describe what it was like to grow up in the home environment. Target Date: 2023-05-24 Frequency: Biweekly  Progress: 0 Modality: individual  Related Interventions Develop the client's family genogram and/or symptom line and help identify patterns of dysfunction within the family. Objective Increase level of trust of others as shown by more socialization and greater intimacy tolerance. Target Date: 2023-05-24 Frequency: Biweekly  Progress: 0 Modality: individual  Related Interventions Teach the client the share-check method of building trust in relationships (sharing a little information and checking as to the recipient's sensitivity in reacting to that information). Teach the client the advantages of treating people as trustworthy given a reasonable amount of time to assess their character. Objective Describe each family member and identify the role each played within the family. Target Date: 2023-05-24 Frequency: Biweekly  Progress: 0 Modality: individual  Related Interventions Assist the client in clarifying his/her role within the family and his/her feelings connected to that role. Objective Identify feelings associated with major traumatic incidents in childhood and with parental child-rearing patterns. Target Date: 2023-05-24 Frequency: Biweekly  Progress: 0 Modality: individual   Related Interventions Support and encourage the client when he/she begins to express feelings of rage, sadness, fear, and rejection relating to family abuse or neglect. Assign the client to record feelings in a journal that describes memories, behavior, and emotions tied to his/her traumatic childhood experiences (or assign "How the Trauma Affects Me" in the Adult Psychotherapy Homework Planner by Stephannie Li). Ask the client to read books on the emotional effects of neglect and abuse in childhood (e.g., It Will Never Happen to Me by [facility]; Outgrowing the Pain by Bronson Curb; Healing the Child Within by [facility]); process insights attained. 13. Reduce overall frequency, intensity, and duration of the anxiety so that daily functioning is not impaired. Objective Reestablish a consistent sleep-wake cycle. Target Date: 2023-05-24 Frequency: Biweekly  Progress: 0 Modality: individual  Related Interventions Teach and implement sleep hygiene practices to help the client reestablish a consistent sleep-wake cycle; review, reinforce success, and provide corrective feedback toward improvement. Objective Identify, challenge, and replace biased, fearful self-talk with positive, realistic, and empowering self-talk. Target Date: 2023-05-24 Frequency: Biweekly  Progress: 0 Modality: individual  Related Interventions Explore the client's schema and self-talk that mediate his/her fear response; assist him/her in challenging the biases; replace the distorted messages with reality-based alternatives and positive, realistic self-talk that will increase his/her self-confidence in coping with irrational fears (see Cognitive Therapy of Anxiety Disorders by Laurence Slate). Assign the client a homework exercise in which he/she identifies fearful self-talk, identifies biases in the self-talk, generates alternatives, and tests through behavioral experiments (or assign "Negative Thoughts Trigger Negative Feelings" in the Adult  Psychotherapy Homework Planner by [facility]); review and reinforce success, providing corrective feedback toward improvement. Objective Learn and implement problem-solving strategies for realistically addressing worries. Target Date: 2023-05-24 Frequency: Biweekly  Progress: 0 Modality: individual  Related Interventions Teach the client problem-solving strategies involving specifically defining a problem, generating options for addressing it, evaluating the pros and cons of each option, selecting and implementing an optional action, and reevaluating and refining the action (or assign "Applying Problem-Solving to Interpersonal Conflict" in the Adult Psychotherapy Homework Planner by Stephannie Li). Objective Learn and implement calming skills to reduce overall anxiety and manage anxiety symptoms. Target Date: 2023-05-24 Frequency: Biweekly  Progress: 0 Modality: individual  Related Interventions Teach the client calming/relaxation skills (e.g., applied  relaxation, progressive muscle relaxation, cue controlled relaxation; mindful breathing; biofeedback) and how to discriminate better between relaxation and tension; teach the client how to apply these skills to his/her daily life (e.g., New Directions in Progressive Muscle Relaxation by Marcelyn Ditty, and Hazlett-Stevens; Treating Generalized Anxiety Disorder by Rygh and Ida Rogue). Assign the client to read about progressive muscle relaxation and other calming strategies in relevant books or treatment manuals (e.g., Progressive Relaxation Training by Robb Matar and Alen Blew; Mastery of Your Anxiety and Worry: Workbook by Earlie Counts). Objective Learn and implement relapse prevention strategies for managing possible future anxiety symptoms. Target Date: 2023-05-24 Frequency: Biweekly  Progress: 0 Modality: individual  Related Interventions Discuss with the client the distinction between a lapse and relapse, associating a lapse with an initial and  reversible return of worry, anxiety symptoms, or urges to avoid, and relapse with the decision to continue the fearful and avoidant patterns. Identify and rehearse with the client the management of future situations or circumstances in which lapses could occur. Instruct the client to routinely use new therapeutic skills (e.g., relaxation, cognitive restructuring, exposure, and problem-solving) in daily life to address emergent worries, anxiety, and avoidant tendencies. Develop a "coping card" on which coping strategies and other important information (e.g., "Breathe deeply and relax," "Challenge unrealistic worries," "Use problem-solving") are written for the client's later use. Objective Identify the major life conflicts from the past and present that form the basis for present anxiety. Target Date: 2023-05-24 Frequency: Biweekly  Progress: 0 Modality: individual  Related Interventions Reinforce the client's insights into the role of his/her past emotional pain and present anxiety. Ask the client to develop and process a list of key past and present life conflicts that continue to cause worry. Assist the client in becoming aware of key unresolved life conflicts and in starting to work toward their resolution. 14. Release the emotions associated with past childhood/family issues, resulting in less resentment and more serenity. 15. Resolve past childhood/family issues, leading to less anger and depression, greater self-esteem, security, and confidence. 16. Resolve the core conflict that is the source of anxiety. 17. Stabilize anxiety level while increasing ability to function on a daily basis.  Diagnosis:Major depressive disorder, recurrent episode, moderate (HCC)  Generalized anxiety disorder  PTSD (post-traumatic stress disorder)  Plan:  -meet again Thursday, January 27, 2023 at 8am in person

## 2023-01-10 ENCOUNTER — Ambulatory Visit: Payer: BC Managed Care – PPO | Admitting: Internal Medicine

## 2023-01-19 ENCOUNTER — Encounter: Payer: Self-pay | Admitting: Professional

## 2023-01-19 ENCOUNTER — Telehealth (HOSPITAL_COMMUNITY): Payer: Self-pay | Admitting: Psychiatry

## 2023-01-19 ENCOUNTER — Ambulatory Visit (INDEPENDENT_AMBULATORY_CARE_PROVIDER_SITE_OTHER): Payer: BC Managed Care – PPO | Admitting: Professional

## 2023-01-19 DIAGNOSIS — F4310 Post-traumatic stress disorder, unspecified: Secondary | ICD-10-CM | POA: Diagnosis not present

## 2023-01-19 DIAGNOSIS — F331 Major depressive disorder, recurrent, moderate: Secondary | ICD-10-CM

## 2023-01-19 DIAGNOSIS — F411 Generalized anxiety disorder: Secondary | ICD-10-CM

## 2023-01-19 NOTE — Progress Notes (Signed)
[facility] Counselor/Therapist Progress Note  Patient ID: Shannon [name], MRN: [id number],    Date: 01/19/2023  Time Spent: 56 minutes 9-956am   Treatment Type: Individual Therapy  Risk Assessment: Danger to Self:  No Self-injurious Behavior: No Danger to Others: No  Subjective: The patient arrived on time for her in-person appointment.   Issues addressed: 1-mother a-she is doing fine, it is her father's death that has her struggling 2-father -pt is struggling to cope with her father's death -her children expressed concern to their father that mom isn't doing well, that she is disconnected -her daughter [name] expressed that she is also concerned with how much alcohol she is consuming 2-beach trip with [name] -pt thought it was a good time -her daughter left early and stated she missed her bed concerned but she has not talked to the pt -pt reached out and [name] did not return call 3-mood PHQ-9    01/19/2023    9:16 AM 07/22/2022    7:38 AM 11/04/2021    9:03 AM  Depression screen PHQ 2/9  Decreased Interest 3 2 1   Down, Depressed, Hopeless 3 2 3   PHQ - 2 Score 6 4 4   Altered sleeping 3 2 1   Tired, decreased energy 2 2 2   Change in appetite 3 3 3   Feeling bad or failure about yourself  3 2 0  Trouble concentrating 3 2 1   Moving slowly or fidgety/restless 0 2 1  Suicidal thoughts 0 0 0  PHQ-9 Score 20 17 12   Difficult doing work/chores Very difficult Somewhat difficult Not difficult at all  -pt did not know that she was so depressed -her alcohol intake has increased over the past year and she admits it is a problem -since she returned home from the beach and talked with her she had not had a drink since this past Friday -pt admits drinking has not been for enjoyment but has been to try and make herself feel better -discussed IOP referral and patient accepted; referral made to Jeri Modena at [facility]  Treatment Plan Problems Addressed   Anxiety, Childhood Trauma, Low Self-Esteem, Unipolar Depression Goals 1. Alleviate depressive symptoms and return to previous level of effective functioning. 2. Appropriately grieve the loss in order to normalize mood and to return to previously adaptive level of functioning. Objective Learn and implement behavioral strategies to overcome depression. Target Date: 2023-05-24 Frequency: Biweekly  Progress: 0 Modality: individual  Related Interventions Engage the client in "behavioral activation," increasing his/her activity level and contact with sources of reward, while identifying processes that inhibit activation (see Behavioral Activation for Depression by Katharine Look, Dimidjian, and Herman-Dunn; or assign "Identify and Schedule Pleasant Activities" in the Adult Psychotherapy Homework Planner by [facility]); use behavioral techniques such as instruction, rehearsal, role-playing, role reversal, as needed, to facilitate activity in the client's daily life; reinforce success. Assist the client in developing skills that increase the likelihood of deriving pleasure from behavioral activation (e.g., assertiveness skills, developing an exercise plan, less internal/more external focus, increased social involvement); reinforce success. Objective Learn and implement conflict resolution skills to resolve interpersonal problems. Target Date: 2023-05-24 Frequency: Biweekly  Progress: 0 Modality: individual  Related Interventions Teach conflict resolution skills (e.g., empathy, active listening, "I messages," respectful communication, assertiveness without aggression, compromise); use psychoeducation, modeling, role-playing, and rehearsal to work through several current conflicts; assign homework exercises; review and repeat so as to integrate their use into the client's life. Objective Identify important people in life, past and present, and describe the  quality, good and poor, of those relationships. Target  Date: 2023-05-24 Frequency: Biweekly  Progress: 0 Modality: individual  Objective Learn and implement relapse prevention skills. Target Date: 2023-05-24 Frequency: Biweekly  Progress: 0 Modality: individual  Related Interventions Identify and rehearse with the client the management of future situations or circumstances in which lapses could occur. Objective Verbalize an understanding of healthy and unhealthy emotions with the intent of increasing the use of healthy emotions to guide actions. Target Date: 2023-05-24 Frequency: Biweekly  Progress: 0 Modality: individual  Related Interventions Use a process-experiential approach consistent with Emotion-Focused Therapy to create a safe, nurturing environment in which the client can process emotions, learning to identify and regulate unhealthy feelings and to generate more adaptive ones that then guide actions (see Emotion-Focused Therapy for Depression by Peter Minium). 3. Demonstrate improved self-esteem through more pride in appearance, more assertiveness, greater eye contact, and identification of positive traits in self-talk messages. 4. Develop an awareness of how childhood issues have affected and continue to affect one's family life. 5. Develop healthy interpersonal relationships that lead to the alleviation and help prevent the relapse of depression. 6. Develop healthy thinking patterns and beliefs about self, others, and the world that lead to the alleviation and help prevent the relapse of depression. 7. Elevate self-esteem. Objective Demonstrate an increased ability to identify and express personal feelings. Target Date: 2023-05-24 Frequency: Biweekly  Progress: 0 Modality: individual  Related Interventions Assist the client in identifying and labeling emotions. Objective Acknowledge feeling less competent than most others. Target Date: 2023-05-24 Frequency: Biweekly  Progress: 0 Modality: individual  Related  Interventions Explore the client's assessment of himself/herself and what is verbalized as the basis for negative self-perception. Actively build the level of trust with the client in individual sessions through consistent eye contact, active listening, unconditional positive regard, and warm acceptance to help increase his/her ability to identify and express feelings. Objective Decrease the frequency of negative self-descriptive statements and increase frequency of positive self-descriptive statements. Target Date: 2023-05-24 Frequency: Biweekly  Progress: 0 Modality: individual  Related Interventions Assist the client in becoming aware of how he/she expresses or acts out negative feelings about himself/herself. Help the client reframe his/her negative assessment of himself/herself. Assist the client in developing positive self-talk as a way of boosting his/her confidence and self-image (or assign "Positive Self-Talk" in the Adult Psychotherapy Homework Planner by Stephannie Li). Objective Identify and engage in activities that would improve self-image by being consistent with one's values. Target Date: 2023-05-24 Frequency: Biweekly  Progress: 0 Modality: individual  Related Interventions Help the client analyze his/her values and the congruence or incongruence between them and the client's daily activities. Identify and assign activities congruent with the client's values; process them toward improving self-concept and self-esteem. Objective Identify positive traits and talents about self. Target Date: 2023-05-24 Frequency: Biweekly  Progress: 0 Modality: individual  Related Interventions Assign the client the exercise of identifying his/her positive physical characteristics in a mirror to help him/her become more comfortable with himself/herself. Ask the client to keep building a list of positive traits and have him/her read the list at the beginning and end of each session (or assign  "Acknowledging My Strengths" or "What Are My Good Qualities?" in the Adult Psychotherapy Homework Planner by [facility]); reinforce the client's positive self-descriptive statements. 8. Enhance ability to effectively cope with the full variety of life's worries and anxieties. 9. Establish an inward sense of self-worth, confidence, and competence. 10. Interact socially without undue distress or disability. 11.  Learn and implement coping skills that result in a reduction of anxiety and worry, and improved daily functioning. 12. Let go of blame and begin to forgive others for pain caused in childhood. Objective Describe what it was like to grow up in the home environment. Target Date: 2023-05-24 Frequency: Biweekly  Progress: 0 Modality: individual  Related Interventions Develop the client's family genogram and/or symptom line and help identify patterns of dysfunction within the family. Objective Increase level of trust of others as shown by more socialization and greater intimacy tolerance. Target Date: 2023-05-24 Frequency: Biweekly  Progress: 0 Modality: individual  Related Interventions Teach the client the share-check method of building trust in relationships (sharing a little information and checking as to the recipient's sensitivity in reacting to that information). Teach the client the advantages of treating people as trustworthy given a reasonable amount of time to assess their character. Objective Describe each family member and identify the role each played within the family. Target Date: 2023-05-24 Frequency: Biweekly  Progress: 0 Modality: individual  Related Interventions Assist the client in clarifying his/her role within the family and his/her feelings connected to that role. Objective Identify feelings associated with major traumatic incidents in childhood and with parental child-rearing patterns. Target Date: 2023-05-24 Frequency: Biweekly  Progress: 0 Modality: individual   Related Interventions Support and encourage the client when he/she begins to express feelings of rage, sadness, fear, and rejection relating to family abuse or neglect. Assign the client to record feelings in a journal that describes memories, behavior, and emotions tied to his/her traumatic childhood experiences (or assign "How the Trauma Affects Me" in the Adult Psychotherapy Homework Planner by Stephannie Li). Ask the client to read books on the emotional effects of neglect and abuse in childhood (e.g., It Will Never Happen to Me by [facility]; Outgrowing the Pain by Bronson Curb; Healing the Child Within by [facility]); process insights attained. 13. Reduce overall frequency, intensity, and duration of the anxiety so that daily functioning is not impaired. Objective Reestablish a consistent sleep-wake cycle. Target Date: 2023-05-24 Frequency: Biweekly  Progress: 0 Modality: individual  Related Interventions Teach and implement sleep hygiene practices to help the client reestablish a consistent sleep-wake cycle; review, reinforce success, and provide corrective feedback toward improvement. Objective Identify, challenge, and replace biased, fearful self-talk with positive, realistic, and empowering self-talk. Target Date: 2023-05-24 Frequency: Biweekly  Progress: 0 Modality: individual  Related Interventions Explore the client's schema and self-talk that mediate his/her fear response; assist him/her in challenging the biases; replace the distorted messages with reality-based alternatives and positive, realistic self-talk that will increase his/her self-confidence in coping with irrational fears (see Cognitive Therapy of Anxiety Disorders by Laurence Slate). Assign the client a homework exercise in which he/she identifies fearful self-talk, identifies biases in the self-talk, generates alternatives, and tests through behavioral experiments (or assign "Negative Thoughts Trigger Negative Feelings" in the Adult  Psychotherapy Homework Planner by [facility]); review and reinforce success, providing corrective feedback toward improvement. Objective Learn and implement problem-solving strategies for realistically addressing worries. Target Date: 2023-05-24 Frequency: Biweekly  Progress: 0 Modality: individual  Related Interventions Teach the client problem-solving strategies involving specifically defining a problem, generating options for addressing it, evaluating the pros and cons of each option, selecting and implementing an optional action, and reevaluating and refining the action (or assign "Applying Problem-Solving to Interpersonal Conflict" in the Adult Psychotherapy Homework Planner by Stephannie Li). Objective Learn and implement calming skills to reduce overall anxiety and manage anxiety symptoms. Target Date: 2023-05-24 Frequency: Biweekly  Progress: 0 Modality: individual  Related Interventions Teach the client calming/relaxation skills (e.g., applied relaxation, progressive muscle relaxation, cue controlled relaxation; mindful breathing; biofeedback) and how to discriminate better between relaxation and tension; teach the client how to apply these skills to his/her daily life (e.g., New Directions in Progressive Muscle Relaxation by Marcelyn Ditty, and Hazlett-Stevens; Treating Generalized Anxiety Disorder by Rygh and Ida Rogue). Assign the client to read about progressive muscle relaxation and other calming strategies in relevant books or treatment manuals (e.g., Progressive Relaxation Training by Robb Matar and Alen Blew; Mastery of Your Anxiety and Worry: Workbook by Earlie Counts). Objective Learn and implement relapse prevention strategies for managing possible future anxiety symptoms. Target Date: 2023-05-24 Frequency: Biweekly  Progress: 0 Modality: individual  Related Interventions Discuss with the client the distinction between a lapse and relapse, associating a lapse with an initial and  reversible return of worry, anxiety symptoms, or urges to avoid, and relapse with the decision to continue the fearful and avoidant patterns. Identify and rehearse with the client the management of future situations or circumstances in which lapses could occur. Instruct the client to routinely use new therapeutic skills (e.g., relaxation, cognitive restructuring, exposure, and problem-solving) in daily life to address emergent worries, anxiety, and avoidant tendencies. Develop a "coping card" on which coping strategies and other important information (e.g., "Breathe deeply and relax," "Challenge unrealistic worries," "Use problem-solving") are written for the client's later use. Objective Identify the major life conflicts from the past and present that form the basis for present anxiety. Target Date: 2023-05-24 Frequency: Biweekly  Progress: 0 Modality: individual  Related Interventions Reinforce the client's insights into the role of his/her past emotional pain and present anxiety. Ask the client to develop and process a list of key past and present life conflicts that continue to cause worry. Assist the client in becoming aware of key unresolved life conflicts and in starting to work toward their resolution. 14. Release the emotions associated with past childhood/family issues, resulting in less resentment and more serenity. 15. Resolve past childhood/family issues, leading to less anger and depression, greater self-esteem, security, and confidence. 16. Resolve the core conflict that is the source of anxiety. 17. Stabilize anxiety level while increasing ability to function on a daily basis.  Diagnosis:Major depressive disorder, recurrent episode, moderate (HCC)  Generalized anxiety disorder  PTSD (post-traumatic stress disorder)  Plan:  -meet again Tuesday, February 01, 2023 at 8am virtual

## 2023-01-19 NOTE — Telephone Encounter (Signed)
D:  Teofilo Pod, [facility] referred pt to virtual MH-IOP.  A:  Placed call to orient patient.  Pt states she didn't know she had to do the groups consecutively.  "I am here the week of Sept. 30th and Oct. 7th or after Nov. 4th."  Pt states she will contact her insurance company and decide when she would prefer to start.  Provided pt with the case manager's phone number.  Inform Olegario Messier.

## 2023-01-20 ENCOUNTER — Other Ambulatory Visit: Payer: Self-pay | Admitting: Family

## 2023-01-21 NOTE — Telephone Encounter (Signed)
Requesting: Klonopin 0.5mg   Contract: 07/22/22 UDS: 07/22/22 Last Visit: 11/22/2022 Next Visit: None Last Refill: [id number] and 0RF   Please Advise

## 2023-01-27 ENCOUNTER — Ambulatory Visit: Payer: BC Managed Care – PPO | Admitting: Professional

## 2023-02-01 ENCOUNTER — Encounter: Payer: Self-pay | Admitting: Professional

## 2023-02-01 ENCOUNTER — Ambulatory Visit (INDEPENDENT_AMBULATORY_CARE_PROVIDER_SITE_OTHER): Payer: BC Managed Care – PPO | Admitting: Professional

## 2023-02-01 DIAGNOSIS — F331 Major depressive disorder, recurrent, moderate: Secondary | ICD-10-CM

## 2023-02-01 DIAGNOSIS — F411 Generalized anxiety disorder: Secondary | ICD-10-CM

## 2023-02-01 DIAGNOSIS — F4310 Post-traumatic stress disorder, unspecified: Secondary | ICD-10-CM | POA: Diagnosis not present

## 2023-02-01 NOTE — Progress Notes (Signed)
[facility] Counselor/Therapist Progress Note  Patient ID: Shannon [name], MRN: [id number],    Date: 02/01/2023  Time Spent: 57 minutes [id number]   Treatment Type: Individual Therapy  Risk Assessment: Danger to Self:  No Self-injurious Behavior: No Danger to Others: No  Subjective: This session was held via video teletherapy. The patient consented to video teletherapy and was located at her home during this session. She is aware it is the responsibility of the patient to secure confidentiality on her end of the session. The provider was in a private home office for the duration of this session.    The patient arrived on time for her Caregility session.   Issues addressed: 1-mood -continues to be depressed and experiencing same symptoms as our previous session -while at the beach she stayed in bed and cried most of the time -doesn't feel great, isn't sleeping good -she is going to try and do a couple swim lessons later this week -"I can always put on a good face" -pt reports she is highly unstructured at this time 2-IOP -she intends to begin on Nov 1st as she will be out of town for three weeks 3-grief a-pt thought of her father while sitting at the beach (before the storm) b-she got into bed when it started to rain and remained in bed for 3-4 days c-pt shared story related to the sadness of the death of her paternal grandmother -she is upset she didn't get to say goodbye -she didn't understand why the adults were having a party, she was 49 -pt saw her grandmother walk through the hallway -there were many parallels to her father's death -her grandmother would correct her when the pt would say "I'm afraid I can't" and asked her to try -her grandmother was very important especially when her parents were having marital issues -no one talked about her grandmother after she died and people do not want to talk about her father d-consider putting grief on the shelf to  enjoy life -pt recalls last year at the beach for her father's celebration of life -pt's father would walk to a stream at the beach with great granddaughter Sherian Maroon when she was little and a picture was taken -pt's father requested an up to date picture it be recreated and they did so -her father showed her unconditional love when she told him she was pregnant -he said she would never be alone (he said "we" will get through this) -her father said he would handle letting her mother know -her father scheduled an appt with their priest and she was a mess -her mother called her a whore, was not supportive when she found out, -her most positive memory of her mother is that she stayed with her through her entire labor with Jorja Loa -after Jorja Loa was born she loved him, kissed on him, and once she stated that she wanted to keep [name] and not give up for adoption and her mom was more supportive e-her sister is struggling but doesn't have any support from her family  Treatment Plan Problems Addressed  Anxiety, Childhood Trauma, Low Self-Esteem, Unipolar Depression Goals 1. Alleviate depressive symptoms and return to previous level of effective functioning. 2. Appropriately grieve the loss in order to normalize mood and to return to previously adaptive level of functioning. Objective Learn and implement behavioral strategies to overcome depression. Target Date: 2023-05-24 Frequency: Biweekly  Progress: 0 Modality: individual  Related Interventions Engage the client in "behavioral activation," increasing his/her activity level and contact with  sources of reward, while identifying processes that inhibit activation (see Behavioral Activation for Depression by Katharine Look, Dimidjian, and Herman-Dunn; or assign "Identify and Schedule Pleasant Activities" in the Adult Psychotherapy Homework Planner by [facility]); use behavioral techniques such as instruction, rehearsal, role-playing, role reversal, as needed, to facilitate  activity in the client's daily life; reinforce success. Assist the client in developing skills that increase the likelihood of deriving pleasure from behavioral activation (e.g., assertiveness skills, developing an exercise plan, less internal/more external focus, increased social involvement); reinforce success. Objective Learn and implement conflict resolution skills to resolve interpersonal problems. Target Date: 2023-05-24 Frequency: Biweekly  Progress: 0 Modality: individual  Related Interventions Teach conflict resolution skills (e.g., empathy, active listening, "I messages," respectful communication, assertiveness without aggression, compromise); use psychoeducation, modeling, role-playing, and rehearsal to work through several current conflicts; assign homework exercises; review and repeat so as to integrate their use into the client's life. Objective Identify important people in life, past and present, and describe the quality, good and poor, of those relationships. Target Date: 2023-05-24 Frequency: Biweekly  Progress: 0 Modality: individual  Objective Learn and implement relapse prevention skills. Target Date: 2023-05-24 Frequency: Biweekly  Progress: 0 Modality: individual  Related Interventions Identify and rehearse with the client the management of future situations or circumstances in which lapses could occur. Objective Verbalize an understanding of healthy and unhealthy emotions with the intent of increasing the use of healthy emotions to guide actions. Target Date: 2023-05-24 Frequency: Biweekly  Progress: 0 Modality: individual  Related Interventions Use a process-experiential approach consistent with Emotion-Focused Therapy to create a safe, nurturing environment in which the client can process emotions, learning to identify and regulate unhealthy feelings and to generate more adaptive ones that then guide actions (see Emotion-Focused Therapy for Depression by Peter Minium). 3. Demonstrate improved self-esteem through more pride in appearance, more assertiveness, greater eye contact, and identification of positive traits in self-talk messages. 4. Develop an awareness of how childhood issues have affected and continue to affect one's family life. 5. Develop healthy interpersonal relationships that lead to the alleviation and help prevent the relapse of depression. 6. Develop healthy thinking patterns and beliefs about self, others, and the world that lead to the alleviation and help prevent the relapse of depression. 7. Elevate self-esteem. Objective Demonstrate an increased ability to identify and express personal feelings. Target Date: 2023-05-24 Frequency: Biweekly  Progress: 0 Modality: individual  Related Interventions Assist the client in identifying and labeling emotions. Objective Acknowledge feeling less competent than most others. Target Date: 2023-05-24 Frequency: Biweekly  Progress: 0 Modality: individual  Related Interventions Explore the client's assessment of himself/herself and what is verbalized as the basis for negative self-perception. Actively build the level of trust with the client in individual sessions through consistent eye contact, active listening, unconditional positive regard, and warm acceptance to help increase his/her ability to identify and express feelings. Objective Decrease the frequency of negative self-descriptive statements and increase frequency of positive self-descriptive statements. Target Date: 2023-05-24 Frequency: Biweekly  Progress: 0 Modality: individual  Related Interventions Assist the client in becoming aware of how he/she expresses or acts out negative feelings about himself/herself. Help the client reframe his/her negative assessment of himself/herself. Assist the client in developing positive self-talk as a way of boosting his/her confidence and self-image (or assign "Positive Self-Talk" in the Adult  Psychotherapy Homework Planner by Stephannie Li). Objective Identify and engage in activities that would improve self-image by being consistent with one's values. Target Date: 2023-05-24 Frequency: Biweekly  Progress: 0 Modality: individual  Related Interventions Help the client analyze his/her values and the congruence or incongruence between them and the client's daily activities. Identify and assign activities congruent with the client's values; process them toward improving self-concept and self-esteem. Objective Identify positive traits and talents about self. Target Date: 2023-05-24 Frequency: Biweekly  Progress: 0 Modality: individual  Related Interventions Assign the client the exercise of identifying his/her positive physical characteristics in a mirror to help him/her become more comfortable with himself/herself. Ask the client to keep building a list of positive traits and have him/her read the list at the beginning and end of each session (or assign "Acknowledging My Strengths" or "What Are My Good Qualities?" in the Adult Psychotherapy Homework Planner by [facility]); reinforce the client's positive self-descriptive statements. 8. Enhance ability to effectively cope with the full variety of life's worries and anxieties. 9. Establish an inward sense of self-worth, confidence, and competence. 10. Interact socially without undue distress or disability. 11. Learn and implement coping skills that result in a reduction of anxiety and worry, and improved daily functioning. 12. Let go of blame and begin to forgive others for pain caused in childhood. Objective Describe what it was like to grow up in the home environment. Target Date: 2023-05-24 Frequency: Biweekly  Progress: 0 Modality: individual  Related Interventions Develop the client's family genogram and/or symptom line and help identify patterns of dysfunction within the family. Objective Increase level of trust of others as shown by  more socialization and greater intimacy tolerance. Target Date: 2023-05-24 Frequency: Biweekly  Progress: 0 Modality: individual  Related Interventions Teach the client the share-check method of building trust in relationships (sharing a little information and checking as to the recipient's sensitivity in reacting to that information). Teach the client the advantages of treating people as trustworthy given a reasonable amount of time to assess their character. Objective Describe each family member and identify the role each played within the family. Target Date: 2023-05-24 Frequency: Biweekly  Progress: 0 Modality: individual  Related Interventions Assist the client in clarifying his/her role within the family and his/her feelings connected to that role. Objective Identify feelings associated with major traumatic incidents in childhood and with parental child-rearing patterns. Target Date: 2023-05-24 Frequency: Biweekly  Progress: 0 Modality: individual  Related Interventions Support and encourage the client when he/she begins to express feelings of rage, sadness, fear, and rejection relating to family abuse or neglect. Assign the client to record feelings in a journal that describes memories, behavior, and emotions tied to his/her traumatic childhood experiences (or assign "How the Trauma Affects Me" in the Adult Psychotherapy Homework Planner by Stephannie Li). Ask the client to read books on the emotional effects of neglect and abuse in childhood (e.g., It Will Never Happen to Me by [facility]; Outgrowing the Pain by Bronson Curb; Healing the Child Within by [facility]); process insights attained. 13. Reduce overall frequency, intensity, and duration of the anxiety so that daily functioning is not impaired. Objective Reestablish a consistent sleep-wake cycle. Target Date: 2023-05-24 Frequency: Biweekly  Progress: 0 Modality: individual  Related Interventions Teach and implement sleep hygiene practices to  help the client reestablish a consistent sleep-wake cycle; review, reinforce success, and provide corrective feedback toward improvement. Objective Identify, challenge, and replace biased, fearful self-talk with positive, realistic, and empowering self-talk. Target Date: 2023-05-24 Frequency: Biweekly  Progress: 0 Modality: individual  Related Interventions Explore the client's schema and self-talk that mediate his/her fear response; assist him/her in challenging the biases; replace the distorted messages  with reality-based alternatives and positive, realistic self-talk that will increase his/her self-confidence in coping with irrational fears (see Cognitive Therapy of Anxiety Disorders by Laurence Slate). Assign the client a homework exercise in which he/she identifies fearful self-talk, identifies biases in the self-talk, generates alternatives, and tests through behavioral experiments (or assign "Negative Thoughts Trigger Negative Feelings" in the Adult Psychotherapy Homework Planner by [facility]); review and reinforce success, providing corrective feedback toward improvement. Objective Learn and implement problem-solving strategies for realistically addressing worries. Target Date: 2023-05-24 Frequency: Biweekly  Progress: 0 Modality: individual  Related Interventions Teach the client problem-solving strategies involving specifically defining a problem, generating options for addressing it, evaluating the pros and cons of each option, selecting and implementing an optional action, and reevaluating and refining the action (or assign "Applying Problem-Solving to Interpersonal Conflict" in the Adult Psychotherapy Homework Planner by Stephannie Li). Objective Learn and implement calming skills to reduce overall anxiety and manage anxiety symptoms. Target Date: 2023-05-24 Frequency: Biweekly  Progress: 0 Modality: individual  Related Interventions Teach the client calming/relaxation skills (e.g., applied  relaxation, progressive muscle relaxation, cue controlled relaxation; mindful breathing; biofeedback) and how to discriminate better between relaxation and tension; teach the client how to apply these skills to his/her daily life (e.g., New Directions in Progressive Muscle Relaxation by Marcelyn Ditty, and Hazlett-Stevens; Treating Generalized Anxiety Disorder by Rygh and Ida Rogue). Assign the client to read about progressive muscle relaxation and other calming strategies in relevant books or treatment manuals (e.g., Progressive Relaxation Training by Robb Matar and Alen Blew; Mastery of Your Anxiety and Worry: Workbook by Earlie Counts). Objective Learn and implement relapse prevention strategies for managing possible future anxiety symptoms. Target Date: 2023-05-24 Frequency: Biweekly  Progress: 0 Modality: individual  Related Interventions Discuss with the client the distinction between a lapse and relapse, associating a lapse with an initial and reversible return of worry, anxiety symptoms, or urges to avoid, and relapse with the decision to continue the fearful and avoidant patterns. Identify and rehearse with the client the management of future situations or circumstances in which lapses could occur. Instruct the client to routinely use new therapeutic skills (e.g., relaxation, cognitive restructuring, exposure, and problem-solving) in daily life to address emergent worries, anxiety, and avoidant tendencies. Develop a "coping card" on which coping strategies and other important information (e.g., "Breathe deeply and relax," "Challenge unrealistic worries," "Use problem-solving") are written for the client's later use. Objective Identify the major life conflicts from the past and present that form the basis for present anxiety. Target Date: 2023-05-24 Frequency: Biweekly  Progress: 0 Modality: individual  Related Interventions Reinforce the client's insights into the role of his/her past  emotional pain and present anxiety. Ask the client to develop and process a list of key past and present life conflicts that continue to cause worry. Assist the client in becoming aware of key unresolved life conflicts and in starting to work toward their resolution. 14. Release the emotions associated with past childhood/family issues, resulting in less resentment and more serenity. 15. Resolve past childhood/family issues, leading to less anger and depression, greater self-esteem, security, and confidence. 16. Resolve the core conflict that is the source of anxiety. 17. Stabilize anxiety level while increasing ability to function on a daily basis.  Diagnosis:Major depressive disorder, recurrent episode, moderate (HCC)  Generalized anxiety disorder  PTSD (post-traumatic stress disorder)  Plan:  -meet again Thursday, February 10, 2023 at 11am in person

## 2023-02-04 DIAGNOSIS — R21 Rash and other nonspecific skin eruption: Secondary | ICD-10-CM | POA: Diagnosis not present

## 2023-02-04 DIAGNOSIS — L299 Pruritus, unspecified: Secondary | ICD-10-CM | POA: Diagnosis not present

## 2023-02-04 DIAGNOSIS — J300 Vasomotor rhinitis: Secondary | ICD-10-CM | POA: Diagnosis not present

## 2023-02-04 DIAGNOSIS — T781XXA Other adverse food reactions, not elsewhere classified, initial encounter: Secondary | ICD-10-CM | POA: Diagnosis not present

## 2023-02-09 ENCOUNTER — Other Ambulatory Visit (HOSPITAL_COMMUNITY): Payer: Self-pay

## 2023-02-09 ENCOUNTER — Telehealth: Payer: Self-pay

## 2023-02-09 NOTE — Telephone Encounter (Signed)
Pharmacy Patient Advocate Encounter   Received notification from CoverMyMeds that prior authorization for Klonopin 0.5mg  tabs  is required/requested.   Insurance verification completed.   The patient is insured through [employer], [facility] .   Per test claim: PA required; PA started via CoverMyMeds. KEY [id number] . Waiting for clinical questions to populate.

## 2023-02-09 NOTE — Telephone Encounter (Signed)
 Clinical questions answered and PA submitted

## 2023-02-10 ENCOUNTER — Ambulatory Visit (INDEPENDENT_AMBULATORY_CARE_PROVIDER_SITE_OTHER): Payer: BC Managed Care – PPO | Admitting: Professional

## 2023-02-10 ENCOUNTER — Encounter: Payer: Self-pay | Admitting: Professional

## 2023-02-10 DIAGNOSIS — F331 Major depressive disorder, recurrent, moderate: Secondary | ICD-10-CM | POA: Diagnosis not present

## 2023-02-10 DIAGNOSIS — F4310 Post-traumatic stress disorder, unspecified: Secondary | ICD-10-CM

## 2023-02-10 DIAGNOSIS — F411 Generalized anxiety disorder: Secondary | ICD-10-CM | POA: Diagnosis not present

## 2023-02-10 NOTE — Progress Notes (Signed)
Behavioral Health Counselor/Therapist Progress Note  Patient ID: Shannon [name], MRN: [id number],    Date: 02/10/2023  Time Spent: 47 minutes 1103-1150am   Treatment Type: Individual Therapy  Risk Assessment: Danger to Self:  No Self-injurious Behavior: No Danger to Others: No  Subjective: The patient arrived on time for her in-person appointment.   Issues addressed: 1-mood -continues to be depressed t about the same level as our previous session -pt has stopped drinking during the week and is admits it was making her sadness -pt feels slight anger toward her father for not asking for help when he needed it -discussed the difficulty with coping in the second year given that the numbness is lifted, the supports fade -it has been a tough week emotionally and she has not had anything to drink 2-pt and spouse spent the week at the beach -it was a a good week -she thinks her body is showing the sorrow as evidenced by feeling faint, feeling anxious -she was preoccupied by her grief and was on her phone -pt suggested she ruined time with her husband by handing on her phone -she was able to identify her trigger in session; the trigger was not realizing where her jewelry was   -all the sentimental jewelry was missing 3-coping -swimming -time with granddaughters -time with husband  Treatment Plan Problems Addressed  Anxiety, Childhood Trauma, Low Self-Esteem, Unipolar Depression Goals 1. Alleviate depressive symptoms and return to previous level of effective functioning. 2. Appropriately grieve the loss in order to normalize mood and to return to previously adaptive level of functioning. Objective Learn and implement behavioral strategies to overcome depression. Target Date: 2023-05-24 Frequency: Biweekly  Progress: 0 Modality: individual  Related Interventions Engage the client in "behavioral activation," increasing his/her activity level and contact with sources of  reward, while identifying processes that inhibit activation (see Behavioral Activation for Depression by Katharine Look, Dimidjian, and Herman-Dunn; or assign "Identify and Schedule Pleasant Activities" in the Adult Psychotherapy Homework Planner by [facility]); use behavioral techniques such as instruction, rehearsal, role-playing, role reversal, as needed, to facilitate activity in the client's daily life; reinforce success. Assist the client in developing skills that increase the likelihood of deriving pleasure from behavioral activation (e.g., assertiveness skills, developing an exercise plan, less internal/more external focus, increased social involvement); reinforce success. Objective Learn and implement conflict resolution skills to resolve interpersonal problems. Target Date: 2023-05-24 Frequency: Biweekly  Progress: 0 Modality: individual  Related Interventions Teach conflict resolution skills (e.g., empathy, active listening, "I messages," respectful communication, assertiveness without aggression, compromise); use psychoeducation, modeling, role-playing, and rehearsal to work through several current conflicts; assign homework exercises; review and repeat so as to integrate their use into the client's life. Objective Identify important people in life, past and present, and describe the quality, good and poor, of those relationships. Target Date: 2023-05-24 Frequency: Biweekly  Progress: 0 Modality: individual  Objective Learn and implement relapse prevention skills. Target Date: 2023-05-24 Frequency: Biweekly  Progress: 0 Modality: individual  Related Interventions Identify and rehearse with the client the management of future situations or circumstances in which lapses could occur. Objective Verbalize an understanding of healthy and unhealthy emotions with the intent of increasing the use of healthy emotions to guide actions. Target Date: 2023-05-24 Frequency: Biweekly  Progress: 0 Modality:  individual  Related Interventions Use a process-experiential approach consistent with Emotion-Focused Therapy to create a safe, nurturing environment in which the client can process emotions, learning to identify and regulate unhealthy feelings and to generate more adaptive  ones that then guide actions (see Emotion-Focused Therapy for Depression by Peter Minium). 3. Demonstrate improved self-esteem through more pride in appearance, more assertiveness, greater eye contact, and identification of positive traits in self-talk messages. 4. Develop an awareness of how childhood issues have affected and continue to affect one's family life. 5. Develop healthy interpersonal relationships that lead to the alleviation and help prevent the relapse of depression. 6. Develop healthy thinking patterns and beliefs about self, others, and the world that lead to the alleviation and help prevent the relapse of depression. 7. Elevate self-esteem. Objective Demonstrate an increased ability to identify and express personal feelings. Target Date: 2023-05-24 Frequency: Biweekly  Progress: 0 Modality: individual  Related Interventions Assist the client in identifying and labeling emotions. Objective Acknowledge feeling less competent than most others. Target Date: 2023-05-24 Frequency: Biweekly  Progress: 0 Modality: individual  Related Interventions Explore the client's assessment of himself/herself and what is verbalized as the basis for negative self-perception. Actively build the level of trust with the client in individual sessions through consistent eye contact, active listening, unconditional positive regard, and warm acceptance to help increase his/her ability to identify and express feelings. Objective Decrease the frequency of negative self-descriptive statements and increase frequency of positive self-descriptive statements. Target Date: 2023-05-24 Frequency: Biweekly  Progress: 0 Modality:  individual  Related Interventions Assist the client in becoming aware of how he/she expresses or acts out negative feelings about himself/herself. Help the client reframe his/her negative assessment of himself/herself. Assist the client in developing positive self-talk as a way of boosting his/her confidence and self-image (or assign "Positive Self-Talk" in the Adult Psychotherapy Homework Planner by Stephannie Li). Objective Identify and engage in activities that would improve self-image by being consistent with one's values. Target Date: 2023-05-24 Frequency: Biweekly  Progress: 0 Modality: individual  Related Interventions Help the client analyze his/her values and the congruence or incongruence between them and the client's daily activities. Identify and assign activities congruent with the client's values; process them toward improving self-concept and self-esteem. Objective Identify positive traits and talents about self. Target Date: 2023-05-24 Frequency: Biweekly  Progress: 0 Modality: individual  Related Interventions Assign the client the exercise of identifying his/her positive physical characteristics in a mirror to help him/her become more comfortable with himself/herself. Ask the client to keep building a list of positive traits and have him/her read the list at the beginning and end of each session (or assign "Acknowledging My Strengths" or "What Are My Good Qualities?" in the Adult Psychotherapy Homework Planner by [facility]); reinforce the client's positive self-descriptive statements. 8. Enhance ability to effectively cope with the full variety of life's worries and anxieties. 9. Establish an inward sense of self-worth, confidence, and competence. 10. Interact socially without undue distress or disability. 11. Learn and implement coping skills that result in a reduction of anxiety and worry, and improved daily functioning. 12. Let go of blame and begin to forgive others for pain  caused in childhood. Objective Describe what it was like to grow up in the home environment. Target Date: 2023-05-24 Frequency: Biweekly  Progress: 0 Modality: individual  Related Interventions Develop the client's family genogram and/or symptom line and help identify patterns of dysfunction within the family. Objective Increase level of trust of others as shown by more socialization and greater intimacy tolerance. Target Date: 2023-05-24 Frequency: Biweekly  Progress: 0 Modality: individual  Related Interventions Teach the client the share-check method of building trust in relationships (sharing a little information and checking as to the  recipient's sensitivity in reacting to that information). Teach the client the advantages of treating people as trustworthy given a reasonable amount of time to assess their character. Objective Describe each family member and identify the role each played within the family. Target Date: 2023-05-24 Frequency: Biweekly  Progress: 0 Modality: individual  Related Interventions Assist the client in clarifying his/her role within the family and his/her feelings connected to that role. Objective Identify feelings associated with major traumatic incidents in childhood and with parental child-rearing patterns. Target Date: 2023-05-24 Frequency: Biweekly  Progress: 0 Modality: individual  Related Interventions Support and encourage the client when he/she begins to express feelings of rage, sadness, fear, and rejection relating to family abuse or neglect. Assign the client to record feelings in a journal that describes memories, behavior, and emotions tied to his/her traumatic childhood experiences (or assign "How the Trauma Affects Me" in the Adult Psychotherapy Homework Planner by Stephannie Li). Ask the client to read books on the emotional effects of neglect and abuse in childhood (e.g., It Will Never Happen to Me by [facility]; Outgrowing the Pain by Bronson Curb; Healing the  Child Within by [facility]); process insights attained. 13. Reduce overall frequency, intensity, and duration of the anxiety so that daily functioning is not impaired. Objective Reestablish a consistent sleep-wake cycle. Target Date: 2023-05-24 Frequency: Biweekly  Progress: 0 Modality: individual  Related Interventions Teach and implement sleep hygiene practices to help the client reestablish a consistent sleep-wake cycle; review, reinforce success, and provide corrective feedback toward improvement. Objective Identify, challenge, and replace biased, fearful self-talk with positive, realistic, and empowering self-talk. Target Date: 2023-05-24 Frequency: Biweekly  Progress: 0 Modality: individual  Related Interventions Explore the client's schema and self-talk that mediate his/her fear response; assist him/her in challenging the biases; replace the distorted messages with reality-based alternatives and positive, realistic self-talk that will increase his/her self-confidence in coping with irrational fears (see Cognitive Therapy of Anxiety Disorders by Laurence Slate). Assign the client a homework exercise in which he/she identifies fearful self-talk, identifies biases in the self-talk, generates alternatives, and tests through behavioral experiments (or assign "Negative Thoughts Trigger Negative Feelings" in the Adult Psychotherapy Homework Planner by [facility]); review and reinforce success, providing corrective feedback toward improvement. Objective Learn and implement problem-solving strategies for realistically addressing worries. Target Date: 2023-05-24 Frequency: Biweekly  Progress: 0 Modality: individual  Related Interventions Teach the client problem-solving strategies involving specifically defining a problem, generating options for addressing it, evaluating the pros and cons of each option, selecting and implementing an optional action, and reevaluating and refining the action (or assign  "Applying Problem-Solving to Interpersonal Conflict" in the Adult Psychotherapy Homework Planner by Stephannie Li). Objective Learn and implement calming skills to reduce overall anxiety and manage anxiety symptoms. Target Date: 2023-05-24 Frequency: Biweekly  Progress: 0 Modality: individual  Related Interventions Teach the client calming/relaxation skills (e.g., applied relaxation, progressive muscle relaxation, cue controlled relaxation; mindful breathing; biofeedback) and how to discriminate better between relaxation and tension; teach the client how to apply these skills to his/her daily life (e.g., New Directions in Progressive Muscle Relaxation by Marcelyn Ditty, and Hazlett-Stevens; Treating Generalized Anxiety Disorder by Rygh and Ida Rogue). Assign the client to read about progressive muscle relaxation and other calming strategies in relevant books or treatment manuals (e.g., Progressive Relaxation Training by Robb Matar and Alen Blew; Mastery of Your Anxiety and Worry: Workbook by Earlie Counts). Objective Learn and implement relapse prevention strategies for managing possible future anxiety symptoms. Target Date: 2023-05-24 Frequency: Biweekly  Progress: 0 Modality:  individual  Related Interventions Discuss with the client the distinction between a lapse and relapse, associating a lapse with an initial and reversible return of worry, anxiety symptoms, or urges to avoid, and relapse with the decision to continue the fearful and avoidant patterns. Identify and rehearse with the client the management of future situations or circumstances in which lapses could occur. Instruct the client to routinely use new therapeutic skills (e.g., relaxation, cognitive restructuring, exposure, and problem-solving) in daily life to address emergent worries, anxiety, and avoidant tendencies. Develop a "coping card" on which coping strategies and other important information (e.g., "Breathe deeply and relax,"  "Challenge unrealistic worries," "Use problem-solving") are written for the client's later use. Objective Identify the major life conflicts from the past and present that form the basis for present anxiety. Target Date: 2023-05-24 Frequency: Biweekly  Progress: 0 Modality: individual  Related Interventions Reinforce the client's insights into the role of his/her past emotional pain and present anxiety. Ask the client to develop and process a list of key past and present life conflicts that continue to cause worry. Assist the client in becoming aware of key unresolved life conflicts and in starting to work toward their resolution. 14. Release the emotions associated with past childhood/family issues, resulting in less resentment and more serenity. 15. Resolve past childhood/family issues, leading to less anger and depression, greater self-esteem, security, and confidence. 16. Resolve the core conflict that is the source of anxiety. 17. Stabilize anxiety level while increasing ability to function on a daily basis.  Diagnosis:Major depressive disorder, recurrent episode, moderate (HCC)  Generalized anxiety disorder  PTSD (post-traumatic stress disorder)  Plan:  -meet again Tuesday, February 16, 2023 at 11am virtual

## 2023-02-14 NOTE — Telephone Encounter (Signed)
Pharmacy Patient Advocate Encounter  Received notification from [facility] that Prior Authorization for Klonopi 0.5mg  tabs has been APPROVED from 02/12/23 to 02/09/24   PA #/Case ID/Reference #: [id number]

## 2023-02-16 ENCOUNTER — Ambulatory Visit: Payer: BC Managed Care – PPO | Admitting: Professional

## 2023-02-16 ENCOUNTER — Other Ambulatory Visit: Payer: Self-pay | Admitting: Family

## 2023-02-16 ENCOUNTER — Telehealth: Payer: Self-pay | Admitting: Family

## 2023-02-16 NOTE — Telephone Encounter (Signed)
Rx was sent today.

## 2023-02-16 NOTE — Telephone Encounter (Signed)
Prescription Request  02/16/2023  Is this a "Controlled Substance" medicine? No  LOV: 11/22/2022  What is the name of the medication or equipment? KLONOPIN 0.5 MG tablet   Have you contacted your pharmacy to request a refill? No   Which pharmacy would you like this sent to?  [facility] [id number] - [place], [place] - [address] [place] [address] Phone: [phone] Fax: [phone]  PT STATED THAT SHE WILL BE OUT OF TOWN AND WOULD LIKE TO RECEIVE IT BY TOMORROW IF POSSIBLE SO SHE'S NOT BEHIND ON MEDS. PLEASE CALL PATIENT IF NOT POSSIBLE.   Patient notified that their request is being sent to the clinical staff for review and that they should receive a response within 2 business days.   Please advise at [facility] [phone]

## 2023-02-18 MED ORDER — KLONOPIN 0.5 MG PO TABS: 0.5000 mg | ORAL_TABLET | Freq: Three times a day (TID) | ORAL | 0 refills | Status: DC | PRN

## 2023-02-18 NOTE — Addendum Note (Signed)
Addended by: Sandford Craze on: 02/18/2023 03:05 PM   Modules accepted: Orders

## 2023-02-18 NOTE — Telephone Encounter (Signed)
Pt called and stated that she called [facility] and  stated that they did not have a prescription for her. I informed pt that it was sent in. However, pt is now out of [place] and would like it sent to [facility] Address: [address] [place], [place] [address] Hours:  Open ? Closes 12?AM Pharmacy: Open ? Closes 1:30?PM ? Reopens 2?PM  More hours Phone: [phone].   Please advise pt.

## 2023-03-01 ENCOUNTER — Encounter: Payer: Self-pay | Admitting: Professional

## 2023-03-01 ENCOUNTER — Ambulatory Visit (INDEPENDENT_AMBULATORY_CARE_PROVIDER_SITE_OTHER): Payer: BC Managed Care – PPO | Admitting: Professional

## 2023-03-01 DIAGNOSIS — F411 Generalized anxiety disorder: Secondary | ICD-10-CM

## 2023-03-01 DIAGNOSIS — F4310 Post-traumatic stress disorder, unspecified: Secondary | ICD-10-CM

## 2023-03-01 DIAGNOSIS — F331 Major depressive disorder, recurrent, moderate: Secondary | ICD-10-CM | POA: Diagnosis not present

## 2023-03-01 NOTE — Progress Notes (Signed)
[facility] Counselor/Therapist Progress Note  Patient ID: Shannon [name], MRN: [id number],    Date: 03/01/2023  Time Spent: 50 minutes 9-950am   Treatment Type: Individual Therapy  Risk Assessment: Danger to Self:  No Self-injurious Behavior: No Danger to Others: No  Subjective: This session was held via video teletherapy. The patient consented to video teletherapy and was located in her office during this session. She is aware it is the responsibility of the patient to secure confidentiality on her end of the session. The provider was in a private home office for the duration of this session.    The patient arrived on time for the Caregility appointment.   Issues addressed: 1-mood -continues to be depressed t about the same level as our previous session -pt has stopped drinking during the week and is admits it was making her sadness -pt feels slight anger toward her father for not asking for help when he needed it -discussed the difficulty with coping in the second year given that the numbness is lifted, the supports fade -it has been a tough week emotionally and she has not had anything to drink 2-anxiety -pt has been feeling anxious related to her husband [name] being sick -pt's sister [name] has been sick and pt was so concerned she flew to see her 3-grief a-Friday was her father's birthday b-was at the beach with her family -she and Engineer, site painted on [employer] [employer] -at [employer] they put roses in the creek that her dad loved   -this year [name] had two roses to also commemorate c-last year the rose did not go out to the creek -this year the roses "went out to sea together and was really peaceful and made me feel good" c-her sister did not come to the beach -her husband went on a golfing trip so she had to stay back to care for the her parent's dog Lily d-pt was upset Th-Fri leading up to his birthday -everyone appropriately was upset at his celebration of  life 4-family a-went to grandparents day and had a lovely time b-pt and [name] are doing well together c-the family is going back down again and Christine's bff's parents are coming down and staying with them -Christmas for [name] is figured out based on the traditions from his childhood -pt dreads the holidays -pt would want for all her family to be together but she knows that [name] will not do and that if they were together she would be on pins and needless  Treatment Plan Problems Addressed  Anxiety, Childhood Trauma, Low Self-Esteem, Unipolar Depression Goals 1. Alleviate depressive symptoms and return to previous level of effective functioning. 2. Appropriately grieve the loss in order to normalize mood and to return to previously adaptive level of functioning. Objective Learn and implement behavioral strategies to overcome depression. Target Date: 2023-05-24 Frequency: Biweekly  Progress: 0 Modality: individual  Related Interventions Engage the client in "behavioral activation," increasing his/her activity level and contact with sources of reward, while identifying processes that inhibit activation (see Behavioral Activation for Depression by Katharine Look, Dimidjian, and Herman-Dunn; or assign "Identify and Schedule Pleasant Activities" in the Adult Psychotherapy Homework Planner by [facility]); use behavioral techniques such as instruction, rehearsal, role-playing, role reversal, as needed, to facilitate activity in the client's daily life; reinforce success. Assist the client in developing skills that increase the likelihood of deriving pleasure from behavioral activation (e.g., assertiveness skills, developing an exercise plan, less internal/more external focus, increased social involvement); reinforce success. Objective Learn and implement  conflict resolution skills to resolve interpersonal problems. Target Date: 2023-05-24 Frequency: Biweekly  Progress: 0 Modality: individual   Related Interventions Teach conflict resolution skills (e.g., empathy, active listening, "I messages," respectful communication, assertiveness without aggression, compromise); use psychoeducation, modeling, role-playing, and rehearsal to work through several current conflicts; assign homework exercises; review and repeat so as to integrate their use into the client's life. Objective Identify important people in life, past and present, and describe the quality, good and poor, of those relationships. Target Date: 2023-05-24 Frequency: Biweekly  Progress: 0 Modality: individual  Objective Learn and implement relapse prevention skills. Target Date: 2023-05-24 Frequency: Biweekly  Progress: 0 Modality: individual  Related Interventions Identify and rehearse with the client the management of future situations or circumstances in which lapses could occur. Objective Verbalize an understanding of healthy and unhealthy emotions with the intent of increasing the use of healthy emotions to guide actions. Target Date: 2023-05-24 Frequency: Biweekly  Progress: 0 Modality: individual  Related Interventions Use a process-experiential approach consistent with Emotion-Focused Therapy to create a safe, nurturing environment in which the client can process emotions, learning to identify and regulate unhealthy feelings and to generate more adaptive ones that then guide actions (see Emotion-Focused Therapy for Depression by Peter Minium). 3. Demonstrate improved self-esteem through more pride in appearance, more assertiveness, greater eye contact, and identification of positive traits in self-talk messages. 4. Develop an awareness of how childhood issues have affected and continue to affect one's family life. 5. Develop healthy interpersonal relationships that lead to the alleviation and help prevent the relapse of depression. 6. Develop healthy thinking patterns and beliefs about self, others, and the  world that lead to the alleviation and help prevent the relapse of depression. 7. Elevate self-esteem. Objective Demonstrate an increased ability to identify and express personal feelings. Target Date: 2023-05-24 Frequency: Biweekly  Progress: 0 Modality: individual  Related Interventions Assist the client in identifying and labeling emotions. Objective Acknowledge feeling less competent than most others. Target Date: 2023-05-24 Frequency: Biweekly  Progress: 0 Modality: individual  Related Interventions Explore the client's assessment of himself/herself and what is verbalized as the basis for negative self-perception. Actively build the level of trust with the client in individual sessions through consistent eye contact, active listening, unconditional positive regard, and warm acceptance to help increase his/her ability to identify and express feelings. Objective Decrease the frequency of negative self-descriptive statements and increase frequency of positive self-descriptive statements. Target Date: 2023-05-24 Frequency: Biweekly  Progress: 0 Modality: individual  Related Interventions Assist the client in becoming aware of how he/she expresses or acts out negative feelings about himself/herself. Help the client reframe his/her negative assessment of himself/herself. Assist the client in developing positive self-talk as a way of boosting his/her confidence and self-image (or assign "Positive Self-Talk" in the Adult Psychotherapy Homework Planner by Stephannie Li). Objective Identify and engage in activities that would improve self-image by being consistent with one's values. Target Date: 2023-05-24 Frequency: Biweekly  Progress: 0 Modality: individual  Related Interventions Help the client analyze his/her values and the congruence or incongruence between them and the client's daily activities. Identify and assign activities congruent with the client's values; process them toward improving  self-concept and self-esteem. Objective Identify positive traits and talents about self. Target Date: 2023-05-24 Frequency: Biweekly  Progress: 0 Modality: individual  Related Interventions Assign the client the exercise of identifying his/her positive physical characteristics in a mirror to help him/her become more comfortable with himself/herself. Ask the client to keep building a list  of positive traits and have him/her read the list at the beginning and end of each session (or assign "Acknowledging My Strengths" or "What Are My Good Qualities?" in the Adult Psychotherapy Homework Planner by [facility]); reinforce the client's positive self-descriptive statements. 8. Enhance ability to effectively cope with the full variety of life's worries and anxieties. 9. Establish an inward sense of self-worth, confidence, and competence. 10. Interact socially without undue distress or disability. 11. Learn and implement coping skills that result in a reduction of anxiety and worry, and improved daily functioning. 12. Let go of blame and begin to forgive others for pain caused in childhood. Objective Describe what it was like to grow up in the home environment. Target Date: 2023-05-24 Frequency: Biweekly  Progress: 0 Modality: individual  Related Interventions Develop the client's family genogram and/or symptom line and help identify patterns of dysfunction within the family. Objective Increase level of trust of others as shown by more socialization and greater intimacy tolerance. Target Date: 2023-05-24 Frequency: Biweekly  Progress: 0 Modality: individual  Related Interventions Teach the client the share-check method of building trust in relationships (sharing a little information and checking as to the recipient's sensitivity in reacting to that information). Teach the client the advantages of treating people as trustworthy given a reasonable amount of time to assess their  character. Objective Describe each family member and identify the role each played within the family. Target Date: 2023-05-24 Frequency: Biweekly  Progress: 0 Modality: individual  Related Interventions Assist the client in clarifying his/her role within the family and his/her feelings connected to that role. Objective Identify feelings associated with major traumatic incidents in childhood and with parental child-rearing patterns. Target Date: 2023-05-24 Frequency: Biweekly  Progress: 0 Modality: individual  Related Interventions Support and encourage the client when he/she begins to express feelings of rage, sadness, fear, and rejection relating to family abuse or neglect. Assign the client to record feelings in a journal that describes memories, behavior, and emotions tied to his/her traumatic childhood experiences (or assign "How the Trauma Affects Me" in the Adult Psychotherapy Homework Planner by Stephannie Li). Ask the client to read books on the emotional effects of neglect and abuse in childhood (e.g., It Will Never Happen to Me by [facility]; Outgrowing the Pain by Bronson Curb; Healing the Child Within by [facility]); process insights attained. 13. Reduce overall frequency, intensity, and duration of the anxiety so that daily functioning is not impaired. Objective Reestablish a consistent sleep-wake cycle. Target Date: 2023-05-24 Frequency: Biweekly  Progress: 0 Modality: individual  Related Interventions Teach and implement sleep hygiene practices to help the client reestablish a consistent sleep-wake cycle; review, reinforce success, and provide corrective feedback toward improvement. Objective Identify, challenge, and replace biased, fearful self-talk with positive, realistic, and empowering self-talk. Target Date: 2023-05-24 Frequency: Biweekly  Progress: 0 Modality: individual  Related Interventions Explore the client's schema and self-talk that mediate his/her fear response; assist him/her in  challenging the biases; replace the distorted messages with reality-based alternatives and positive, realistic self-talk that will increase his/her self-confidence in coping with irrational fears (see Cognitive Therapy of Anxiety Disorders by Laurence Slate). Assign the client a homework exercise in which he/she identifies fearful self-talk, identifies biases in the self-talk, generates alternatives, and tests through behavioral experiments (or assign "Negative Thoughts Trigger Negative Feelings" in the Adult Psychotherapy Homework Planner by [facility]); review and reinforce success, providing corrective feedback toward improvement. Objective Learn and implement problem-solving strategies for realistically addressing worries. Target Date: 2023-05-24 Frequency: Biweekly  Progress:  0 Modality: individual  Related Interventions Teach the client problem-solving strategies involving specifically defining a problem, generating options for addressing it, evaluating the pros and cons of each option, selecting and implementing an optional action, and reevaluating and refining the action (or assign "Applying Problem-Solving to Interpersonal Conflict" in the Adult Psychotherapy Homework Planner by Stephannie Li). Objective Learn and implement calming skills to reduce overall anxiety and manage anxiety symptoms. Target Date: 2023-05-24 Frequency: Biweekly  Progress: 0 Modality: individual  Related Interventions Teach the client calming/relaxation skills (e.g., applied relaxation, progressive muscle relaxation, cue controlled relaxation; mindful breathing; biofeedback) and how to discriminate better between relaxation and tension; teach the client how to apply these skills to his/her daily life (e.g., New Directions in Progressive Muscle Relaxation by Marcelyn Ditty, and Hazlett-Stevens; Treating Generalized Anxiety Disorder by Rygh and Ida Rogue). Assign the client to read about progressive muscle relaxation and  other calming strategies in relevant books or treatment manuals (e.g., Progressive Relaxation Training by Robb Matar and Alen Blew; Mastery of Your Anxiety and Worry: Workbook by Earlie Counts). Objective Learn and implement relapse prevention strategies for managing possible future anxiety symptoms. Target Date: 2023-05-24 Frequency: Biweekly  Progress: 0 Modality: individual  Related Interventions Discuss with the client the distinction between a lapse and relapse, associating a lapse with an initial and reversible return of worry, anxiety symptoms, or urges to avoid, and relapse with the decision to continue the fearful and avoidant patterns. Identify and rehearse with the client the management of future situations or circumstances in which lapses could occur. Instruct the client to routinely use new therapeutic skills (e.g., relaxation, cognitive restructuring, exposure, and problem-solving) in daily life to address emergent worries, anxiety, and avoidant tendencies. Develop a "coping card" on which coping strategies and other important information (e.g., "Breathe deeply and relax," "Challenge unrealistic worries," "Use problem-solving") are written for the client's later use. Objective Identify the major life conflicts from the past and present that form the basis for present anxiety. Target Date: 2023-05-24 Frequency: Biweekly  Progress: 0 Modality: individual  Related Interventions Reinforce the client's insights into the role of his/her past emotional pain and present anxiety. Ask the client to develop and process a list of key past and present life conflicts that continue to cause worry. Assist the client in becoming aware of key unresolved life conflicts and in starting to work toward their resolution. 14. Release the emotions associated with past childhood/family issues, resulting in less resentment and more serenity. 15. Resolve past childhood/family issues, leading to less anger and  depression, greater self-esteem, security, and confidence. 16. Resolve the core conflict that is the source of anxiety. 17. Stabilize anxiety level while increasing ability to function on a daily basis.  Diagnosis:Major depressive disorder, recurrent episode, moderate (HCC)  Generalized anxiety disorder  PTSD (post-traumatic stress disorder)  Plan:  -meet again Thursday, March 11, 2023 at 8am in-person

## 2023-03-10 ENCOUNTER — Encounter: Payer: Self-pay | Admitting: Professional

## 2023-03-10 ENCOUNTER — Ambulatory Visit: Payer: BC Managed Care – PPO | Admitting: Professional

## 2023-03-10 DIAGNOSIS — F331 Major depressive disorder, recurrent, moderate: Secondary | ICD-10-CM

## 2023-03-10 DIAGNOSIS — F411 Generalized anxiety disorder: Secondary | ICD-10-CM | POA: Diagnosis not present

## 2023-03-10 DIAGNOSIS — F4310 Post-traumatic stress disorder, unspecified: Secondary | ICD-10-CM

## 2023-03-10 NOTE — Progress Notes (Signed)
[facility] Counselor/Therapist Progress Note  Patient ID: Shannon [name], MRN: [id number],    Date: 03/10/2023  Time Spent: 50 minutes [id number]   Treatment Type: Individual Therapy  Risk Assessment: Danger to Self:  No Self-injurious Behavior: No Danger to Others: No  Subjective: This session was held via video teletherapy. The patient consented to video teletherapy and was located in her office during this session. She is aware it is the responsibility of the patient to secure confidentiality on her end of the session. The provider was in a private home office for the duration of this session.    The patient arrived on time for the Caregility appointment.   Issues addressed: 1-mood -tightness in chest and sob -once arriving at the beach she can breath 2-anxiety -pt struggled all weekend at the beach with Shannon [name] and Shannon [name]'s childhood bff and her parents 3-reframing negative memories -pt was unable to engage positive memories when playing a "get to know you" game -being prepared with  4-grief -reminisced about her father's great sense of humor -pt cannot ride by parents house in [facility]   -challenged pt's to drive by -has only had one dream of her parents   -he ws standing and coming in room to tell he something   -it was not good news based on pt's seeing his emotions   -song played after father's death leaving hospital     -that song played on way to funeral and a lot through out  -song recently began playing again and she and her sister were to "hold on"    -pt is concerned that her aunt is going to die given that each time the song played someone died    -pt then wondered if it could be her spouse Shannon [name] since he is not very healthy   Treatment Plan Problems Addressed  Anxiety, Childhood Trauma, Low Self-Esteem, Unipolar Depression Goals 1. Alleviate depressive symptoms and return to previous level of effective functioning. 2.  Appropriately grieve the loss in order to normalize mood and to return to previously adaptive level of functioning. Objective Learn and implement behavioral strategies to overcome depression. Target Date: 2023-05-24 Frequency: Biweekly  Progress: 0 Modality: individual  Related Interventions Engage the client in "behavioral activation," increasing his/her activity level and contact with sources of reward, while identifying processes that inhibit activation (see Behavioral Activation for Depression by Shannon [name], Shannon [name], and Shannon [name]; or assign "Identify and Schedule Pleasant Activities" in the Adult Psychotherapy Homework Planner by Shannon [name]); use behavioral techniques such as instruction, rehearsal, role-playing, role reversal, as needed, to facilitate activity in the client's daily life; reinforce success. Assist the client in developing skills that increase the likelihood of deriving pleasure from behavioral activation (e.g., assertiveness skills, developing an exercise plan, less internal/more external focus, increased social involvement); reinforce success. Objective Learn and implement conflict resolution skills to resolve interpersonal problems. Target Date: 2023-05-24 Frequency: Biweekly  Progress: 0 Modality: individual  Related Interventions Teach conflict resolution skills (e.g., empathy, active listening, "I messages," respectful communication, assertiveness without aggression, compromise); use psychoeducation, modeling, role-playing, and rehearsal to work through several current conflicts; assign homework exercises; review and repeat so as to integrate their use into the client's life. Objective Identify important people in life, past and present, and describe the quality, good and poor, of those relationships. Target Date: 2023-05-24 Frequency: Biweekly  Progress: 0 Modality: individual  Objective Learn and implement relapse prevention skills. Target Date: 2023-05-24 Frequency:  Biweekly  Progress: 0 Modality: individual  Related  Interventions Identify and rehearse with the client the management of future situations or circumstances in which lapses could occur. Objective Verbalize an understanding of healthy and unhealthy emotions with the intent of increasing the use of healthy emotions to guide actions. Target Date: 2023-05-24 Frequency: Biweekly  Progress: 0 Modality: individual  Related Interventions Use a process-experiential approach consistent with Emotion-Focused Therapy to create a safe, nurturing environment in which the client can process emotions, learning to identify and regulate unhealthy feelings and to generate more adaptive ones that then guide actions (see Emotion-Focused Therapy for Depression by Peter [name]). 3. Demonstrate improved self-esteem through more pride in appearance, more assertiveness, greater eye contact, and identification of positive traits in self-talk messages. 4. Develop an awareness of how childhood issues have affected and continue to affect one's family life. 5. Develop healthy interpersonal relationships that lead to the alleviation and help prevent the relapse of depression. 6. Develop healthy thinking patterns and beliefs about self, others, and the world that lead to the alleviation and help prevent the relapse of depression. 7. Elevate self-esteem. Objective Demonstrate an increased ability to identify and express personal feelings. Target Date: 2023-05-24 Frequency: Biweekly  Progress: 0 Modality: individual  Related Interventions Assist the client in identifying and labeling emotions. Objective Acknowledge feeling less competent than most others. Target Date: 2023-05-24 Frequency: Biweekly  Progress: 0 Modality: individual  Related Interventions Explore the client's assessment of himself/herself and what is verbalized as the basis for negative self-perception. Actively build the level of trust with the  client in individual sessions through consistent eye contact, active listening, unconditional positive regard, and warm acceptance to help increase his/her ability to identify and express feelings. Objective Decrease the frequency of negative self-descriptive statements and increase frequency of positive self-descriptive statements. Target Date: 2023-05-24 Frequency: Biweekly  Progress: 0 Modality: individual  Related Interventions Assist the client in becoming aware of how he/she expresses or acts out negative feelings about himself/herself. Help the client reframe his/her negative assessment of himself/herself. Assist the client in developing positive self-talk as a way of boosting his/her confidence and self-image (or assign "Positive Self-Talk" in the Adult Psychotherapy Homework Planner by Stephannie Li). Objective Identify and engage in activities that would improve self-image by being consistent with one's values. Target Date: 2023-05-24 Frequency: Biweekly  Progress: 0 Modality: individual  Related Interventions Help the client analyze his/her values and the congruence or incongruence between them and the client's daily activities. Identify and assign activities congruent with the client's values; process them toward improving self-concept and self-esteem. Objective Identify positive traits and talents about self. Target Date: 2023-05-24 Frequency: Biweekly  Progress: 0 Modality: individual  Related Interventions Assign the client the exercise of identifying his/her positive physical characteristics in a mirror to help him/her become more comfortable with himself/herself. Ask the client to keep building a list of positive traits and have him/her read the list at the beginning and end of each session (or assign "Acknowledging My Strengths" or "What Are My Good Qualities?" in the Adult Psychotherapy Homework Planner by [facility]); reinforce the client's positive self-descriptive statements. 8.  Enhance ability to effectively cope with the full variety of life's worries and anxieties. 9. Establish an inward sense of self-worth, confidence, and competence. 10. Interact socially without undue distress or disability. 11. Learn and implement coping skills that result in a reduction of anxiety and worry, and improved daily functioning. 12. Let go of blame and begin to forgive others for pain caused in childhood. Objective Describe what it was  like to grow up in the home environment. Target Date: 2023-05-24 Frequency: Biweekly  Progress: 0 Modality: individual  Related Interventions Develop the client's family genogram and/or symptom line and help identify patterns of dysfunction within the family. Objective Increase level of trust of others as shown by more socialization and greater intimacy tolerance. Target Date: 2023-05-24 Frequency: Biweekly  Progress: 0 Modality: individual  Related Interventions Teach the client the share-check method of building trust in relationships (sharing a little information and checking as to the recipient's sensitivity in reacting to that information). Teach the client the advantages of treating people as trustworthy given a reasonable amount of time to assess their character. Objective Describe each family member and identify the role each played within the family. Target Date: 2023-05-24 Frequency: Biweekly  Progress: 0 Modality: individual  Related Interventions Assist the client in clarifying his/her role within the family and his/her feelings connected to that role. Objective Identify feelings associated with major traumatic incidents in childhood and with parental child-rearing patterns. Target Date: 2023-05-24 Frequency: Biweekly  Progress: 0 Modality: individual  Related Interventions Support and encourage the client when he/she begins to express feelings of rage, sadness, fear, and rejection relating to family abuse or neglect. Assign the  client to record feelings in a journal that describes memories, behavior, and emotions tied to his/her traumatic childhood experiences (or assign "How the Trauma Affects Me" in the Adult Psychotherapy Homework Planner by Stephannie Li). Ask the client to read books on the emotional effects of neglect and abuse in childhood (e.g., It Will Never Happen to Me by [facility]; Outgrowing the Pain by Bronson Curb; Healing the Child Within by [name]); process insights attained. 13. Reduce overall frequency, intensity, and duration of the anxiety so that daily functioning is not impaired. Objective Reestablish a consistent sleep-wake cycle. Target Date: 2023-05-24 Frequency: Biweekly  Progress: 0 Modality: individual  Related Interventions Teach and implement sleep hygiene practices to help the client reestablish a consistent sleep-wake cycle; review, reinforce success, and provide corrective feedback toward improvement. Objective Identify, challenge, and replace biased, fearful self-talk with positive, realistic, and empowering self-talk. Target Date: 2023-05-24 Frequency: Biweekly  Progress: 0 Modality: individual  Related Interventions Explore the client's schema and self-talk that mediate his/her fear response; assist him/her in challenging the biases; replace the distorted messages with reality-based alternatives and positive, realistic self-talk that will increase his/her self-confidence in coping with irrational fears (see Cognitive Therapy of Anxiety Disorders by Laurence Slate). Assign the client a homework exercise in which he/she identifies fearful self-talk, identifies biases in the self-talk, generates alternatives, and tests through behavioral experiments (or assign "Negative Thoughts Trigger Negative Feelings" in the Adult Psychotherapy Homework Planner by [name]); review and reinforce success, providing corrective feedback toward improvement. Objective Learn and implement problem-solving strategies for  realistically addressing worries. Target Date: 2023-05-24 Frequency: Biweekly  Progress: 0 Modality: individual  Related Interventions Teach the client problem-solving strategies involving specifically defining a problem, generating options for addressing it, evaluating the pros and cons of each option, selecting and implementing an optional action, and reevaluating and refining the action (or assign "Applying Problem-Solving to Interpersonal Conflict" in the Adult Psychotherapy Homework Planner by Stephannie Li). Objective Learn and implement calming skills to reduce overall anxiety and manage anxiety symptoms. Target Date: 2023-05-24 Frequency: Biweekly  Progress: 0 Modality: individual  Related Interventions Teach the client calming/relaxation skills (e.g., applied relaxation, progressive muscle relaxation, cue controlled relaxation; mindful breathing; biofeedback) and how to discriminate better between relaxation and tension; teach the client how to  apply these skills to his/her daily life (e.g., New Directions in Progressive Muscle Relaxation by Marcelyn Ditty, and Hazlett-Stevens; Treating Generalized Anxiety Disorder by Rygh and Ida Rogue). Assign the client to read about progressive muscle relaxation and other calming strategies in relevant books or treatment manuals (e.g., Progressive Relaxation Training by Robb Matar and Alen Blew; Mastery of Your Anxiety and Worry: Workbook by Earlie Counts). Objective Learn and implement relapse prevention strategies for managing possible future anxiety symptoms. Target Date: 2023-05-24 Frequency: Biweekly  Progress: 0 Modality: individual  Related Interventions Discuss with the client the distinction between a lapse and relapse, associating a lapse with an initial and reversible return of worry, anxiety symptoms, or urges to avoid, and relapse with the decision to continue the fearful and avoidant patterns. Identify and rehearse with the client the  management of future situations or circumstances in which lapses could occur. Instruct the client to routinely use new therapeutic skills (e.g., relaxation, cognitive restructuring, exposure, and problem-solving) in daily life to address emergent worries, anxiety, and avoidant tendencies. Develop a "coping card" on which coping strategies and other important information (e.g., "Breathe deeply and relax," "Challenge unrealistic worries," "Use problem-solving") are written for the client's later use. Objective Identify the major life conflicts from the past and present that form the basis for present anxiety. Target Date: 2023-05-24 Frequency: Biweekly  Progress: 0 Modality: individual  Related Interventions Reinforce the client's insights into the role of his/her past emotional pain and present anxiety. Ask the client to develop and process a list of key past and present life conflicts that continue to cause worry. Assist the client in becoming aware of key unresolved life conflicts and in starting to work toward their resolution. 14. Release the emotions associated with past childhood/family issues, resulting in less resentment and more serenity. 15. Resolve past childhood/family issues, leading to less anger and depression, greater self-esteem, security, and confidence. 16. Resolve the core conflict that is the source of anxiety. 17. Stabilize anxiety level while increasing ability to function on a daily basis.  Diagnosis:Major depressive disorder, recurrent episode, moderate (HCC)  Generalized anxiety disorder  PTSD (post-traumatic stress disorder)  Plan:  -meet again Thursday, March 24, 2023 at 8am in-person

## 2023-03-11 ENCOUNTER — Other Ambulatory Visit (HOSPITAL_BASED_OUTPATIENT_CLINIC_OR_DEPARTMENT_OTHER): Payer: Self-pay | Admitting: Family

## 2023-03-11 DIAGNOSIS — Z1231 Encounter for screening mammogram for malignant neoplasm of breast: Secondary | ICD-10-CM

## 2023-03-16 DIAGNOSIS — M25552 Pain in left hip: Secondary | ICD-10-CM | POA: Diagnosis not present

## 2023-03-17 ENCOUNTER — Encounter: Payer: Self-pay | Admitting: Professional

## 2023-03-17 ENCOUNTER — Inpatient Hospital Stay (HOSPITAL_BASED_OUTPATIENT_CLINIC_OR_DEPARTMENT_OTHER): Admission: RE | Admit: 2023-03-17 | Payer: BC Managed Care – PPO | Source: Ambulatory Visit

## 2023-03-17 ENCOUNTER — Ambulatory Visit: Payer: BC Managed Care – PPO | Admitting: Professional

## 2023-03-17 DIAGNOSIS — F331 Major depressive disorder, recurrent, moderate: Secondary | ICD-10-CM

## 2023-03-17 DIAGNOSIS — F4310 Post-traumatic stress disorder, unspecified: Secondary | ICD-10-CM | POA: Diagnosis not present

## 2023-03-17 DIAGNOSIS — F411 Generalized anxiety disorder: Secondary | ICD-10-CM | POA: Diagnosis not present

## 2023-03-17 NOTE — Progress Notes (Signed)
[facility] Counselor/Therapist Progress Note  Patient ID: Shannon [name], MRN: [id number],    Date: 03/17/2023  Time Spent: 53 minutes 1103-1156am   Treatment Type: Individual Therapy  Risk Assessment: Danger to Self:  No Self-injurious Behavior: No Danger to Others: No  Subjective: This session was held via video teletherapy. The patient consented to video teletherapy and was located in her office during this session. She is aware it is the responsibility of the patient to secure confidentiality on her end of the session. The provider was in a private home office for the duration of this session.    The patient arrived on time for the Caregility appointment.   Issues addressed: 1-self-care -pt and her spouse are going back to the beach for a week -she has noticed mild improvement in mood 2-holiday discord -pt worried about how the holidays are going to go give the pt's two children will not attend an event where both are present -discussed alternative strategies available -reminded pt that holidays do not have to be celebrated on the actual holiday -pt wants the holidays to be simple and enjoyable 3-grief -pt is feeling the loss of her father as the holiday approaches -she notices that she is not grieving her mother as she is her father  Treatment Plan Problems Addressed  Anxiety, Childhood Trauma, Low Self-Esteem, Unipolar Depression Goals 1. Alleviate depressive symptoms and return to previous level of effective functioning. 2. Appropriately grieve the loss in order to normalize mood and to return to previously adaptive level of functioning. Objective Learn and implement behavioral strategies to overcome depression. Target Date: 2023-05-24 Frequency: Biweekly  Progress: 0 Modality: individual  Related Interventions Engage the client in "behavioral activation," increasing his/her activity level and contact with sources of reward, while identifying  processes that inhibit activation (see Behavioral Activation for Depression by Katharine Look, Dimidjian, and Herman-Dunn; or assign "Identify and Schedule Pleasant Activities" in the Adult Psychotherapy Homework Planner by [facility]); use behavioral techniques such as instruction, rehearsal, role-playing, role reversal, as needed, to facilitate activity in the client's daily life; reinforce success. Assist the client in developing skills that increase the likelihood of deriving pleasure from behavioral activation (e.g., assertiveness skills, developing an exercise plan, less internal/more external focus, increased social involvement); reinforce success. Objective Learn and implement conflict resolution skills to resolve interpersonal problems. Target Date: 2023-05-24 Frequency: Biweekly  Progress: 0 Modality: individual  Related Interventions Teach conflict resolution skills (e.g., empathy, active listening, "I messages," respectful communication, assertiveness without aggression, compromise); use psychoeducation, modeling, role-playing, and rehearsal to work through several current conflicts; assign homework exercises; review and repeat so as to integrate their use into the client's life. Objective Identify important people in life, past and present, and describe the quality, good and poor, of those relationships. Target Date: 2023-05-24 Frequency: Biweekly  Progress: 0 Modality: individual  Objective Learn and implement relapse prevention skills. Target Date: 2023-05-24 Frequency: Biweekly  Progress: 0 Modality: individual  Related Interventions Identify and rehearse with the client the management of future situations or circumstances in which lapses could occur. Objective Verbalize an understanding of healthy and unhealthy emotions with the intent of increasing the use of healthy emotions to guide actions. Target Date: 2023-05-24 Frequency: Biweekly  Progress: 0 Modality: individual  Related  Interventions Use a process-experiential approach consistent with Emotion-Focused Therapy to create a safe, nurturing environment in which the client can process emotions, learning to identify and regulate unhealthy feelings and to generate more adaptive ones that then guide actions (see Emotion-Focused  Therapy for Depression by Peter Minium). 3. Demonstrate improved self-esteem through more pride in appearance, more assertiveness, greater eye contact, and identification of positive traits in self-talk messages. 4. Develop an awareness of how childhood issues have affected and continue to affect one's family life. 5. Develop healthy interpersonal relationships that lead to the alleviation and help prevent the relapse of depression. 6. Develop healthy thinking patterns and beliefs about self, others, and the world that lead to the alleviation and help prevent the relapse of depression. 7. Elevate self-esteem. Objective Demonstrate an increased ability to identify and express personal feelings. Target Date: 2023-05-24 Frequency: Biweekly  Progress: 0 Modality: individual  Related Interventions Assist the client in identifying and labeling emotions. Objective Acknowledge feeling less competent than most others. Target Date: 2023-05-24 Frequency: Biweekly  Progress: 0 Modality: individual  Related Interventions Explore the client's assessment of himself/herself and what is verbalized as the basis for negative self-perception. Actively build the level of trust with the client in individual sessions through consistent eye contact, active listening, unconditional positive regard, and warm acceptance to help increase his/her ability to identify and express feelings. Objective Decrease the frequency of negative self-descriptive statements and increase frequency of positive self-descriptive statements. Target Date: 2023-05-24 Frequency: Biweekly  Progress: 0 Modality: individual  Related  Interventions Assist the client in becoming aware of how he/she expresses or acts out negative feelings about himself/herself. Help the client reframe his/her negative assessment of himself/herself. Assist the client in developing positive self-talk as a way of boosting his/her confidence and self-image (or assign "Positive Self-Talk" in the Adult Psychotherapy Homework Planner by Stephannie Li). Objective Identify and engage in activities that would improve self-image by being consistent with one's values. Target Date: 2023-05-24 Frequency: Biweekly  Progress: 0 Modality: individual  Related Interventions Help the client analyze his/her values and the congruence or incongruence between them and the client's daily activities. Identify and assign activities congruent with the client's values; process them toward improving self-concept and self-esteem. Objective Identify positive traits and talents about self. Target Date: 2023-05-24 Frequency: Biweekly  Progress: 0 Modality: individual  Related Interventions Assign the client the exercise of identifying his/her positive physical characteristics in a mirror to help him/her become more comfortable with himself/herself. Ask the client to keep building a list of positive traits and have him/her read the list at the beginning and end of each session (or assign "Acknowledging My Strengths" or "What Are My Good Qualities?" in the Adult Psychotherapy Homework Planner by [facility]); reinforce the client's positive self-descriptive statements. 8. Enhance ability to effectively cope with the full variety of life's worries and anxieties. 9. Establish an inward sense of self-worth, confidence, and competence. 10. Interact socially without undue distress or disability. 11. Learn and implement coping skills that result in a reduction of anxiety and worry, and improved daily functioning. 12. Let go of blame and begin to forgive others for pain caused in  childhood. Objective Describe what it was like to grow up in the home environment. Target Date: 2023-05-24 Frequency: Biweekly  Progress: 0 Modality: individual  Related Interventions Develop the client's family genogram and/or symptom line and help identify patterns of dysfunction within the family. Objective Increase level of trust of others as shown by more socialization and greater intimacy tolerance. Target Date: 2023-05-24 Frequency: Biweekly  Progress: 0 Modality: individual  Related Interventions Teach the client the share-check method of building trust in relationships (sharing a little information and checking as to the recipient's sensitivity in reacting to that information).  Teach the client the advantages of treating people as trustworthy given a reasonable amount of time to assess their character. Objective Describe each family member and identify the role each played within the family. Target Date: 2023-05-24 Frequency: Biweekly  Progress: 0 Modality: individual  Related Interventions Assist the client in clarifying his/her role within the family and his/her feelings connected to that role. Objective Identify feelings associated with major traumatic incidents in childhood and with parental child-rearing patterns. Target Date: 2023-05-24 Frequency: Biweekly  Progress: 0 Modality: individual  Related Interventions Support and encourage the client when he/she begins to express feelings of rage, sadness, fear, and rejection relating to family abuse or neglect. Assign the client to record feelings in a journal that describes memories, behavior, and emotions tied to his/her traumatic childhood experiences (or assign "How the Trauma Affects Me" in the Adult Psychotherapy Homework Planner by Stephannie Li). Ask the client to read books on the emotional effects of neglect and abuse in childhood (e.g., It Will Never Happen to Me by [facility]; Outgrowing the Pain by Bronson Curb; Healing the Child Within  by [facility]); process insights attained. 13. Reduce overall frequency, intensity, and duration of the anxiety so that daily functioning is not impaired. Objective Reestablish a consistent sleep-wake cycle. Target Date: 2023-05-24 Frequency: Biweekly  Progress: 0 Modality: individual  Related Interventions Teach and implement sleep hygiene practices to help the client reestablish a consistent sleep-wake cycle; review, reinforce success, and provide corrective feedback toward improvement. Objective Identify, challenge, and replace biased, fearful self-talk with positive, realistic, and empowering self-talk. Target Date: 2023-05-24 Frequency: Biweekly  Progress: 0 Modality: individual  Related Interventions Explore the client's schema and self-talk that mediate his/her fear response; assist him/her in challenging the biases; replace the distorted messages with reality-based alternatives and positive, realistic self-talk that will increase his/her self-confidence in coping with irrational fears (see Cognitive Therapy of Anxiety Disorders by Laurence Slate). Assign the client a homework exercise in which he/she identifies fearful self-talk, identifies biases in the self-talk, generates alternatives, and tests through behavioral experiments (or assign "Negative Thoughts Trigger Negative Feelings" in the Adult Psychotherapy Homework Planner by [facility]); review and reinforce success, providing corrective feedback toward improvement. Objective Learn and implement problem-solving strategies for realistically addressing worries. Target Date: 2023-05-24 Frequency: Biweekly  Progress: 0 Modality: individual  Related Interventions Teach the client problem-solving strategies involving specifically defining a problem, generating options for addressing it, evaluating the pros and cons of each option, selecting and implementing an optional action, and reevaluating and refining the action (or assign "Applying  Problem-Solving to Interpersonal Conflict" in the Adult Psychotherapy Homework Planner by Stephannie Li). Objective Learn and implement calming skills to reduce overall anxiety and manage anxiety symptoms. Target Date: 2023-05-24 Frequency: Biweekly  Progress: 0 Modality: individual  Related Interventions Teach the client calming/relaxation skills (e.g., applied relaxation, progressive muscle relaxation, cue controlled relaxation; mindful breathing; biofeedback) and how to discriminate better between relaxation and tension; teach the client how to apply these skills to his/her daily life (e.g., New Directions in Progressive Muscle Relaxation by Marcelyn Ditty, and Hazlett-Stevens; Treating Generalized Anxiety Disorder by Rygh and Ida Rogue). Assign the client to read about progressive muscle relaxation and other calming strategies in relevant books or treatment manuals (e.g., Progressive Relaxation Training by Robb Matar and Alen Blew; Mastery of Your Anxiety and Worry: Workbook by Earlie Counts). Objective Learn and implement relapse prevention strategies for managing possible future anxiety symptoms. Target Date: 2023-05-24 Frequency: Biweekly  Progress: 0 Modality: individual  Related Interventions Discuss with the  client the distinction between a lapse and relapse, associating a lapse with an initial and reversible return of worry, anxiety symptoms, or urges to avoid, and relapse with the decision to continue the fearful and avoidant patterns. Identify and rehearse with the client the management of future situations or circumstances in which lapses could occur. Instruct the client to routinely use new therapeutic skills (e.g., relaxation, cognitive restructuring, exposure, and problem-solving) in daily life to address emergent worries, anxiety, and avoidant tendencies. Develop a "coping card" on which coping strategies and other important information (e.g., "Breathe deeply and relax," "Challenge  unrealistic worries," "Use problem-solving") are written for the client's later use. Objective Identify the major life conflicts from the past and present that form the basis for present anxiety. Target Date: 2023-05-24 Frequency: Biweekly  Progress: 0 Modality: individual  Related Interventions Reinforce the client's insights into the role of his/her past emotional pain and present anxiety. Ask the client to develop and process a list of key past and present life conflicts that continue to cause worry. Assist the client in becoming aware of key unresolved life conflicts and in starting to work toward their resolution. 14. Release the emotions associated with past childhood/family issues, resulting in less resentment and more serenity. 15. Resolve past childhood/family issues, leading to less anger and depression, greater self-esteem, security, and confidence. 16. Resolve the core conflict that is the source of anxiety. 17. Stabilize anxiety level while increasing ability to function on a daily basis.  Diagnosis:Major depressive disorder, recurrent episode, moderate (HCC)  Generalized anxiety disorder  PTSD (post-traumatic stress disorder)  Plan:  -meet again Thursday, March 24, 2023 at 8am in-person

## 2023-03-18 ENCOUNTER — Encounter: Payer: Self-pay | Admitting: Family

## 2023-03-18 DIAGNOSIS — M5451 Vertebrogenic low back pain: Secondary | ICD-10-CM | POA: Diagnosis not present

## 2023-03-18 DIAGNOSIS — M791 Myalgia, unspecified site: Secondary | ICD-10-CM | POA: Diagnosis not present

## 2023-03-20 ENCOUNTER — Other Ambulatory Visit: Payer: Self-pay | Admitting: Family

## 2023-03-24 ENCOUNTER — Ambulatory Visit: Payer: BC Managed Care – PPO | Admitting: Professional

## 2023-04-07 ENCOUNTER — Encounter: Payer: Self-pay | Admitting: Professional

## 2023-04-07 ENCOUNTER — Ambulatory Visit: Payer: BC Managed Care – PPO | Admitting: Professional

## 2023-04-07 DIAGNOSIS — F411 Generalized anxiety disorder: Secondary | ICD-10-CM | POA: Diagnosis not present

## 2023-04-07 DIAGNOSIS — F4310 Post-traumatic stress disorder, unspecified: Secondary | ICD-10-CM

## 2023-04-07 DIAGNOSIS — F331 Major depressive disorder, recurrent, moderate: Secondary | ICD-10-CM | POA: Diagnosis not present

## 2023-04-07 NOTE — Progress Notes (Signed)
[facility] Counselor/Therapist Progress Note  Patient ID: Shannon [name], MRN: [id number],    Date: 04/07/2023  Time Spent: 55 minutes [id number]   Treatment Type: Individual Therapy  Risk Assessment: Danger to Self:  No Self-injurious Behavior: No Danger to Others: No  Subjective: This session was held via video teletherapy. The patient consented to video teletherapy and was located in her office during this session. She is aware it is the responsibility of the patient to secure confidentiality on her end of the session. The provider was in a private home office for the duration of this session.    The patient arrived on time for the Caregility appointment.   Issues addressed: 1-grief -pt tearful -"just when I thought I was past crying, where I could talk about my dad and not get upset, we went to our attorney, I wanted to make sure our forms were all correct, I got halfway through and my voice cracked" -pt came to realization that she and her siblings had lost both her parents within the same year 2-compassion -for SIL Beryle Beams who is trying to find placement for his mother who has dementia -she is falling, is wandering, can't take care of herself, and spouse is overwhelmed -his father has been cruel to her mother -pt has struggled with the situation because of similarities 3-self-care -[facility]  Treatment Plan Problems Addressed  Anxiety, Childhood Trauma, Low Self-Esteem, Unipolar Depression Goals 1. Alleviate depressive symptoms and return to previous level of effective functioning. 2. Appropriately grieve the loss in order to normalize mood and to return to previously adaptive level of functioning. Objective Learn and implement behavioral strategies to overcome depression. Target Date: 2023-05-24 Frequency: Biweekly  Progress: 0 Modality: individual  Related Interventions Engage the client in "behavioral activation," increasing  his/her activity level and contact with sources of reward, while identifying processes that inhibit activation (see Behavioral Activation for Depression by Katharine Look, Dimidjian, and Herman-Dunn; or assign "Identify and Schedule Pleasant Activities" in the Adult Psychotherapy Homework Planner by [facility]); use behavioral techniques such as instruction, rehearsal, role-playing, role reversal, as needed, to facilitate activity in the client's daily life; reinforce success. Assist the client in developing skills that increase the likelihood of deriving pleasure from behavioral activation (e.g., assertiveness skills, developing an exercise plan, less internal/more external focus, increased social involvement); reinforce success. Objective Learn and implement conflict resolution skills to resolve interpersonal problems. Target Date: 2023-05-24 Frequency: Biweekly  Progress: 0 Modality: individual  Related Interventions Teach conflict resolution skills (e.g., empathy, active listening, "I messages," respectful communication, assertiveness without aggression, compromise); use psychoeducation, modeling, role-playing, and rehearsal to work through several current conflicts; assign homework exercises; review and repeat so as to integrate their use into the client's life. Objective Identify important people in life, past and present, and describe the quality, good and poor, of those relationships. Target Date: 2023-05-24 Frequency: Biweekly  Progress: 0 Modality: individual  Objective Learn and implement relapse prevention skills. Target Date: 2023-05-24 Frequency: Biweekly  Progress: 0 Modality: individual  Related Interventions Identify and rehearse with the client the management of future situations or circumstances in which lapses could occur. Objective Verbalize an understanding of healthy and unhealthy emotions with the intent of increasing the use of healthy emotions to guide actions. Target Date:  2023-05-24 Frequency: Biweekly  Progress: 0 Modality: individual  Related Interventions Use a process-experiential approach consistent with Emotion-Focused Therapy to create a safe, nurturing environment in which the client can process emotions, learning to  identify and regulate unhealthy feelings and to generate more adaptive ones that then guide actions (see Emotion-Focused Therapy for Depression by Peter Minium). 3. Demonstrate improved self-esteem through more pride in appearance, more assertiveness, greater eye contact, and identification of positive traits in self-talk messages. 4. Develop an awareness of how childhood issues have affected and continue to affect one's family life. 5. Develop healthy interpersonal relationships that lead to the alleviation and help prevent the relapse of depression. 6. Develop healthy thinking patterns and beliefs about self, others, and the world that lead to the alleviation and help prevent the relapse of depression. 7. Elevate self-esteem. Objective Demonstrate an increased ability to identify and express personal feelings. Target Date: 2023-05-24 Frequency: Biweekly  Progress: 0 Modality: individual  Related Interventions Assist the client in identifying and labeling emotions. Objective Acknowledge feeling less competent than most others. Target Date: 2023-05-24 Frequency: Biweekly  Progress: 0 Modality: individual  Related Interventions Explore the client's assessment of himself/herself and what is verbalized as the basis for negative self-perception. Actively build the level of trust with the client in individual sessions through consistent eye contact, active listening, unconditional positive regard, and warm acceptance to help increase his/her ability to identify and express feelings. Objective Decrease the frequency of negative self-descriptive statements and increase frequency of positive self-descriptive statements. Target Date:  2023-05-24 Frequency: Biweekly  Progress: 0 Modality: individual  Related Interventions Assist the client in becoming aware of how he/she expresses or acts out negative feelings about himself/herself. Help the client reframe his/her negative assessment of himself/herself. Assist the client in developing positive self-talk as a way of boosting his/her confidence and self-image (or assign "Positive Self-Talk" in the Adult Psychotherapy Homework Planner by Stephannie Li). Objective Identify and engage in activities that would improve self-image by being consistent with one's values. Target Date: 2023-05-24 Frequency: Biweekly  Progress: 0 Modality: individual  Related Interventions Help the client analyze his/her values and the congruence or incongruence between them and the client's daily activities. Identify and assign activities congruent with the client's values; process them toward improving self-concept and self-esteem. Objective Identify positive traits and talents about self. Target Date: 2023-05-24 Frequency: Biweekly  Progress: 0 Modality: individual  Related Interventions Assign the client the exercise of identifying his/her positive physical characteristics in a mirror to help him/her become more comfortable with himself/herself. Ask the client to keep building a list of positive traits and have him/her read the list at the beginning and end of each session (or assign "Acknowledging My Strengths" or "What Are My Good Qualities?" in the Adult Psychotherapy Homework Planner by [facility]); reinforce the client's positive self-descriptive statements. 8. Enhance ability to effectively cope with the full variety of life's worries and anxieties. 9. Establish an inward sense of self-worth, confidence, and competence. 10. Interact socially without undue distress or disability. 11. Learn and implement coping skills that result in a reduction of anxiety and worry, and improved daily functioning. 12.  Let go of blame and begin to forgive others for pain caused in childhood. Objective Describe what it was like to grow up in the home environment. Target Date: 2023-05-24 Frequency: Biweekly  Progress: 0 Modality: individual  Related Interventions Develop the client's family genogram and/or symptom line and help identify patterns of dysfunction within the family. Objective Increase level of trust of others as shown by more socialization and greater intimacy tolerance. Target Date: 2023-05-24 Frequency: Biweekly  Progress: 0 Modality: individual  Related Interventions Teach the client the share-check method of building trust in  relationships (sharing a little information and checking as to the recipient's sensitivity in reacting to that information). Teach the client the advantages of treating people as trustworthy given a reasonable amount of time to assess their character. Objective Describe each family member and identify the role each played within the family. Target Date: 2023-05-24 Frequency: Biweekly  Progress: 0 Modality: individual  Related Interventions Assist the client in clarifying his/her role within the family and his/her feelings connected to that role. Objective Identify feelings associated with major traumatic incidents in childhood and with parental child-rearing patterns. Target Date: 2023-05-24 Frequency: Biweekly  Progress: 0 Modality: individual  Related Interventions Support and encourage the client when he/she begins to express feelings of rage, sadness, fear, and rejection relating to family abuse or neglect. Assign the client to record feelings in a journal that describes memories, behavior, and emotions tied to his/her traumatic childhood experiences (or assign "How the Trauma Affects Me" in the Adult Psychotherapy Homework Planner by Stephannie Li). Ask the client to read books on the emotional effects of neglect and abuse in childhood (e.g., It Will Never Happen to Me  by [facility]; Outgrowing the Pain by Bronson Curb; Healing the Child Within by [facility]); process insights attained. 13. Reduce overall frequency, intensity, and duration of the anxiety so that daily functioning is not impaired. Objective Reestablish a consistent sleep-wake cycle. Target Date: 2023-05-24 Frequency: Biweekly  Progress: 0 Modality: individual  Related Interventions Teach and implement sleep hygiene practices to help the client reestablish a consistent sleep-wake cycle; review, reinforce success, and provide corrective feedback toward improvement. Objective Identify, challenge, and replace biased, fearful self-talk with positive, realistic, and empowering self-talk. Target Date: 2023-05-24 Frequency: Biweekly  Progress: 0 Modality: individual  Related Interventions Explore the client's schema and self-talk that mediate his/her fear response; assist him/her in challenging the biases; replace the distorted messages with reality-based alternatives and positive, realistic self-talk that will increase his/her self-confidence in coping with irrational fears (see Cognitive Therapy of Anxiety Disorders by Laurence Slate). Assign the client a homework exercise in which he/she identifies fearful self-talk, identifies biases in the self-talk, generates alternatives, and tests through behavioral experiments (or assign "Negative Thoughts Trigger Negative Feelings" in the Adult Psychotherapy Homework Planner by [facility]); review and reinforce success, providing corrective feedback toward improvement. Objective Learn and implement problem-solving strategies for realistically addressing worries. Target Date: 2023-05-24 Frequency: Biweekly  Progress: 0 Modality: individual  Related Interventions Teach the client problem-solving strategies involving specifically defining a problem, generating options for addressing it, evaluating the pros and cons of each option, selecting and implementing an optional action, and  reevaluating and refining the action (or assign "Applying Problem-Solving to Interpersonal Conflict" in the Adult Psychotherapy Homework Planner by Stephannie Li). Objective Learn and implement calming skills to reduce overall anxiety and manage anxiety symptoms. Target Date: 2023-05-24 Frequency: Biweekly  Progress: 0 Modality: individual  Related Interventions Teach the client calming/relaxation skills (e.g., applied relaxation, progressive muscle relaxation, cue controlled relaxation; mindful breathing; biofeedback) and how to discriminate better between relaxation and tension; teach the client how to apply these skills to his/her daily life (e.g., New Directions in Progressive Muscle Relaxation by Marcelyn Ditty, and Hazlett-Stevens; Treating Generalized Anxiety Disorder by Rygh and Ida Rogue). Assign the client to read about progressive muscle relaxation and other calming strategies in relevant books or treatment manuals (e.g., Progressive Relaxation Training by Robb Matar and Alen Blew; Mastery of Your Anxiety and Worry: Workbook by Earlie Counts). Objective Learn and implement relapse prevention strategies for managing possible future anxiety  symptoms. Target Date: 2023-05-24 Frequency: Biweekly  Progress: 0 Modality: individual  Related Interventions Discuss with the client the distinction between a lapse and relapse, associating a lapse with an initial and reversible return of worry, anxiety symptoms, or urges to avoid, and relapse with the decision to continue the fearful and avoidant patterns. Identify and rehearse with the client the management of future situations or circumstances in which lapses could occur. Instruct the client to routinely use new therapeutic skills (e.g., relaxation, cognitive restructuring, exposure, and problem-solving) in daily life to address emergent worries, anxiety, and avoidant tendencies. Develop a "coping card" on which coping strategies and other important  information (e.g., "Breathe deeply and relax," "Challenge unrealistic worries," "Use problem-solving") are written for the client's later use. Objective Identify the major life conflicts from the past and present that form the basis for present anxiety. Target Date: 2023-05-24 Frequency: Biweekly  Progress: 0 Modality: individual  Related Interventions Reinforce the client's insights into the role of his/her past emotional pain and present anxiety. Ask the client to develop and process a list of key past and present life conflicts that continue to cause worry. Assist the client in becoming aware of key unresolved life conflicts and in starting to work toward their resolution. 14. Release the emotions associated with past childhood/family issues, resulting in less resentment and more serenity. 15. Resolve past childhood/family issues, leading to less anger and depression, greater self-esteem, security, and confidence. 16. Resolve the core conflict that is the source of anxiety. 17. Stabilize anxiety level while increasing ability to function on a daily basis.  Diagnosis:Major depressive disorder, recurrent episode, moderate (HCC)  Generalized anxiety disorder  PTSD (post-traumatic stress disorder)  Plan:  -meet again Thursday, May 05, 2023 at 8am in-person

## 2023-04-13 DIAGNOSIS — W908XXD Exposure to other nonionizing radiation, subsequent encounter: Secondary | ICD-10-CM | POA: Diagnosis not present

## 2023-04-13 DIAGNOSIS — L570 Actinic keratosis: Secondary | ICD-10-CM | POA: Diagnosis not present

## 2023-04-13 DIAGNOSIS — L814 Other melanin hyperpigmentation: Secondary | ICD-10-CM | POA: Diagnosis not present

## 2023-04-13 DIAGNOSIS — L820 Inflamed seborrheic keratosis: Secondary | ICD-10-CM | POA: Diagnosis not present

## 2023-04-13 DIAGNOSIS — L578 Other skin changes due to chronic exposure to nonionizing radiation: Secondary | ICD-10-CM | POA: Diagnosis not present

## 2023-04-13 DIAGNOSIS — D225 Melanocytic nevi of trunk: Secondary | ICD-10-CM | POA: Diagnosis not present

## 2023-04-18 ENCOUNTER — Other Ambulatory Visit: Payer: Self-pay | Admitting: Family

## 2023-04-18 NOTE — Telephone Encounter (Signed)
Requesting: Klonopin 0.5 mg  Contract: 07/22/2022 UDS: 07/22/2022 Last Visit: 0/22/2024 Next Visit: N/A Last Refill: 03/20/2023  Please Advise

## 2023-04-21 ENCOUNTER — Ambulatory Visit: Payer: BC Managed Care – PPO | Admitting: Professional

## 2023-04-28 ENCOUNTER — Ambulatory Visit: Payer: BC Managed Care – PPO | Admitting: Neurology

## 2023-04-30 DIAGNOSIS — M961 Postlaminectomy syndrome, not elsewhere classified: Secondary | ICD-10-CM | POA: Diagnosis not present

## 2023-04-30 DIAGNOSIS — M5412 Radiculopathy, cervical region: Secondary | ICD-10-CM | POA: Diagnosis not present

## 2023-05-05 ENCOUNTER — Encounter: Payer: Self-pay | Admitting: Professional

## 2023-05-05 ENCOUNTER — Ambulatory Visit: Payer: BC Managed Care – PPO | Admitting: Professional

## 2023-05-05 DIAGNOSIS — F4310 Post-traumatic stress disorder, unspecified: Secondary | ICD-10-CM

## 2023-05-05 DIAGNOSIS — F331 Major depressive disorder, recurrent, moderate: Secondary | ICD-10-CM | POA: Diagnosis not present

## 2023-05-05 DIAGNOSIS — F411 Generalized anxiety disorder: Secondary | ICD-10-CM | POA: Diagnosis not present

## 2023-05-05 NOTE — Progress Notes (Signed)
 [facility] Counselor/Therapist Progress Note  Patient ID: Shannon [name], MRN: [id number],    Date: 05/05/2023  Time Spent: 50 minutes [id number]   Treatment Type: Individual Therapy  Risk Assessment: Danger to Self:  No Self-injurious Behavior: No Danger to Others: No  Subjective: This session was held via video teletherapy. The patient consented to video teletherapy and was located in her office during this session. She is aware it is the responsibility of the patient to secure confidentiality on her end of the session. The provider was in a private home office for the duration of this session.    The patient arrived on time for the Caregility appointment.   Issues addressed: 1-Christmas morning -felt a great sense of relief -on Christmas week up to eve she was crying all day -all of her children were supportive -her granddaughters came to the house was an unexpected surprise -there was no tree at her house but when she found out they were coming she got out her dad's [employer] tree with a few ornaments -everyone was gone by 1pm and pt relaxed 2-mother's wedding ring with three diamonds -three granddaughters each got a necklace with her mother's diamonds -pt tearfully shared the experience of her opening the diamond and then reading the card -her daughter [name] was very moved by the history -she wanted the girls to know if was her and [name], and their grandparents 3-son in law Trevor's mother has alzheimer's disease -got out of alarmed home and husband was asleep and did not hear -Frederic continually called and dad did not awaken -eventually found by spouse down the street in ditch after having been outside for an hour -pt has offered to go to Trevor's parents to assist if needed 4-goals for year -planning to be volunteer at [employer] when a position becomes available -she is going to begin walking and swimming daily -started a journal yesterday but  in past has bee too critical of herself -pt would like to make meals this year  Treatment Plan Problems Addressed  Anxiety, Childhood Trauma, Low Self-Esteem, Unipolar Depression Goals 1. Alleviate depressive symptoms and return to previous level of effective functioning. 2. Appropriately grieve the loss in order to normalize mood and to return to previously adaptive level of functioning. Objective Learn and implement behavioral strategies to overcome depression. Target Date: 2023-05-24 Frequency: Biweekly  Progress: 0 Modality: individual  Related Interventions Engage the client in behavioral activation, increasing his/her activity level and contact with sources of reward, while identifying processes that inhibit activation (see Behavioral Activation for Depression by Loleta, Dimidjian, and Herman-Dunn; or assign Identify and Schedule Pleasant Activities in the Adult Psychotherapy Homework Planner by [facility]); use behavioral techniques such as instruction, rehearsal, role-playing, role reversal, as needed, to facilitate activity in the client's daily life; reinforce success. Assist the client in developing skills that increase the likelihood of deriving pleasure from behavioral activation (e.g., assertiveness skills, developing an exercise plan, less internal/more external focus, increased social involvement); reinforce success. Objective Learn and implement conflict resolution skills to resolve interpersonal problems. Target Date: 2023-05-24 Frequency: Biweekly  Progress: 0 Modality: individual  Related Interventions Teach conflict resolution skills (e.g., empathy, active listening, I messages, respectful communication, assertiveness without aggression, compromise); use psychoeducation, modeling, role-playing, and rehearsal to work through several current conflicts; assign homework exercises; review and repeat so as to integrate their use into the client's life. Objective Identify  important people in life, past and present, and describe the quality, good and poor, of  those relationships. Target Date: 2023-05-24 Frequency: Biweekly  Progress: 0 Modality: individual  Objective Learn and implement relapse prevention skills. Target Date: 2023-05-24 Frequency: Biweekly  Progress: 0 Modality: individual  Related Interventions Identify and rehearse with the client the management of future situations or circumstances in which lapses could occur. Objective Verbalize an understanding of healthy and unhealthy emotions with the intent of increasing the use of healthy emotions to guide actions. Target Date: 2023-05-24 Frequency: Biweekly  Progress: 0 Modality: individual  Related Interventions Use a process-experiential approach consistent with Emotion-Focused Therapy to create a safe, nurturing environment in which the client can process emotions, learning to identify and regulate unhealthy feelings and to generate more adaptive ones that then guide actions (see Emotion-Focused Therapy for Depression by Audrey armin Portugal). 3. Demonstrate improved self-esteem through more pride in appearance, more assertiveness, greater eye contact, and identification of positive traits in self-talk messages. 4. Develop an awareness of how childhood issues have affected and continue to affect one's family life. 5. Develop healthy interpersonal relationships that lead to the alleviation and help prevent the relapse of depression. 6. Develop healthy thinking patterns and beliefs about self, others, and the world that lead to the alleviation and help prevent the relapse of depression. 7. Elevate self-esteem. Objective Demonstrate an increased ability to identify and express personal feelings. Target Date: 2023-05-24 Frequency: Biweekly  Progress: 0 Modality: individual  Related Interventions Assist the client in identifying and labeling emotions. Objective Acknowledge feeling less competent than  most others. Target Date: 2023-05-24 Frequency: Biweekly  Progress: 0 Modality: individual  Related Interventions Explore the client's assessment of himself/herself and what is verbalized as the basis for negative self-perception. Actively build the level of trust with the client in individual sessions through consistent eye contact, active listening, unconditional positive regard, and warm acceptance to help increase his/her ability to identify and express feelings. Objective Decrease the frequency of negative self-descriptive statements and increase frequency of positive self-descriptive statements. Target Date: 2023-05-24 Frequency: Biweekly  Progress: 0 Modality: individual  Related Interventions Assist the client in becoming aware of how he/she expresses or acts out negative feelings about himself/herself. Help the client reframe his/her negative assessment of himself/herself. Assist the client in developing positive self-talk as a way of boosting his/her confidence and self-image (or assign Positive Self-Talk in the Adult Psychotherapy Homework Planner by Jenniffer). Objective Identify and engage in activities that would improve self-image by being consistent with one's values. Target Date: 2023-05-24 Frequency: Biweekly  Progress: 0 Modality: individual  Related Interventions Help the client analyze his/her values and the congruence or incongruence between them and the client's daily activities. Identify and assign activities congruent with the client's values; process them toward improving self-concept and self-esteem. Objective Identify positive traits and talents about self. Target Date: 2023-05-24 Frequency: Biweekly  Progress: 0 Modality: individual  Related Interventions Assign the client the exercise of identifying his/her positive physical characteristics in a mirror to help him/her become more comfortable with himself/herself. Ask the client to keep building a list of  positive traits and have him/her read the list at the beginning and end of each session (or assign Acknowledging My Strengths or What Are My Good Qualities? in the Adult Psychotherapy Homework Planner by [facility]); reinforce the client's positive self-descriptive statements. 8. Enhance ability to effectively cope with the full variety of life's worries and anxieties. 9. Establish an inward sense of self-worth, confidence, and competence. 10. Interact socially without undue distress or disability. 11. Learn and implement coping skills that  result in a reduction of anxiety and worry, and improved daily functioning. 12. Let go of blame and begin to forgive others for pain caused in childhood. Objective Describe what it was like to grow up in the home environment. Target Date: 2023-05-24 Frequency: Biweekly  Progress: 0 Modality: individual  Related Interventions Develop the client's family genogram and/or symptom line and help identify patterns of dysfunction within the family. Objective Increase level of trust of others as shown by more socialization and greater intimacy tolerance. Target Date: 2023-05-24 Frequency: Biweekly  Progress: 0 Modality: individual  Related Interventions Teach the client the share-check method of building trust in relationships (sharing a little information and checking as to the recipient's sensitivity in reacting to that information). Teach the client the advantages of treating people as trustworthy given a reasonable amount of time to assess their character. Objective Describe each family member and identify the role each played within the family. Target Date: 2023-05-24 Frequency: Biweekly  Progress: 0 Modality: individual  Related Interventions Assist the client in clarifying his/her role within the family and his/her feelings connected to that role. Objective Identify feelings associated with major traumatic incidents in childhood and with parental  child-rearing patterns. Target Date: 2023-05-24 Frequency: Biweekly  Progress: 0 Modality: individual  Related Interventions Support and encourage the client when he/she begins to express feelings of rage, sadness, fear, and rejection relating to family abuse or neglect. Assign the client to record feelings in a journal that describes memories, behavior, and emotions tied to his/her traumatic childhood experiences (or assign How the Trauma Affects Me in the Adult Psychotherapy Homework Planner by Jenniffer). Ask the client to read books on the emotional effects of neglect and abuse in childhood (e.g., It Will Never Happen to Me by [facility]; Outgrowing the Pain by Tory; Healing the Child Within by [facility]); process insights attained. 13. Reduce overall frequency, intensity, and duration of the anxiety so that daily functioning is not impaired. Objective Reestablish a consistent sleep-wake cycle. Target Date: 2023-05-24 Frequency: Biweekly  Progress: 0 Modality: individual  Related Interventions Teach and implement sleep hygiene practices to help the client reestablish a consistent sleep-wake cycle; review, reinforce success, and provide corrective feedback toward improvement. Objective Identify, challenge, and replace biased, fearful self-talk with positive, realistic, and empowering self-talk. Target Date: 2023-05-24 Frequency: Biweekly  Progress: 0 Modality: individual  Related Interventions Explore the client's schema and self-talk that mediate his/her fear response; assist him/her in challenging the biases; replace the distorted messages with reality-based alternatives and positive, realistic self-talk that will increase his/her self-confidence in coping with irrational fears (see Cognitive Therapy of Anxiety Disorders by Gretta armin Mon). Assign the client a homework exercise in which he/she identifies fearful self-talk, identifies biases in the self-talk, generates alternatives, and tests  through behavioral experiments (or assign Negative Thoughts Trigger Negative Feelings in the Adult Psychotherapy Homework Planner by [facility]); review and reinforce success, providing corrective feedback toward improvement. Objective Learn and implement problem-solving strategies for realistically addressing worries. Target Date: 2023-05-24 Frequency: Biweekly  Progress: 0 Modality: individual  Related Interventions Teach the client problem-solving strategies involving specifically defining a problem, generating options for addressing it, evaluating the pros and cons of each option, selecting and implementing an optional action, and reevaluating and refining the action (or assign Applying Problem-Solving to Interpersonal Conflict in the Adult Psychotherapy Homework Planner by Jenniffer). Objective Learn and implement calming skills to reduce overall anxiety and manage anxiety symptoms. Target Date: 2023-05-24 Frequency: Biweekly  Progress: 0 Modality: individual  Related  Interventions Teach the client calming/relaxation skills (e.g., applied relaxation, progressive muscle relaxation, cue controlled relaxation; mindful breathing; biofeedback) and how to discriminate better between relaxation and tension; teach the client how to apply these skills to his/her daily life (e.g., New Directions in Progressive Muscle Relaxation by Thornell Collier, and Hazlett-Stevens; Treating Generalized Anxiety Disorder by Rygh and Red). Assign the client to read about progressive muscle relaxation and other calming strategies in relevant books or treatment manuals (e.g., Progressive Relaxation Training by Thornell and Collier; Mastery of Your Anxiety and Worry: Workbook by Richarda armin Given). Objective Learn and implement relapse prevention strategies for managing possible future anxiety symptoms. Target Date: 2023-05-24 Frequency: Biweekly  Progress: 0 Modality: individual  Related Interventions Discuss  with the client the distinction between a lapse and relapse, associating a lapse with an initial and reversible return of worry, anxiety symptoms, or urges to avoid, and relapse with the decision to continue the fearful and avoidant patterns. Identify and rehearse with the client the management of future situations or circumstances in which lapses could occur. Instruct the client to routinely use new therapeutic skills (e.g., relaxation, cognitive restructuring, exposure, and problem-solving) in daily life to address emergent worries, anxiety, and avoidant tendencies. Develop a coping card on which coping strategies and other important information (e.g., Breathe deeply and relax, Challenge unrealistic worries, Use problem-solving) are written for the client's later use. Objective Identify the major life conflicts from the past and present that form the basis for present anxiety. Target Date: 2023-05-24 Frequency: Biweekly  Progress: 0 Modality: individual  Related Interventions Reinforce the client's insights into the role of his/her past emotional pain and present anxiety. Ask the client to develop and process a list of key past and present life conflicts that continue to cause worry. Assist the client in becoming aware of key unresolved life conflicts and in starting to work toward their resolution. 14. Release the emotions associated with past childhood/family issues, resulting in less resentment and more serenity. 15. Resolve past childhood/family issues, leading to less anger and depression, greater self-esteem, security, and confidence. 16. Resolve the core conflict that is the source of anxiety. 17. Stabilize anxiety level while increasing ability to function on a daily basis.  Diagnosis:Major depressive disorder, recurrent episode, moderate (HCC)  Generalized anxiety disorder  PTSD (post-traumatic stress disorder)  Plan:  -meet again Thursday, May 19, 2023 at 8am  in-person

## 2023-05-17 ENCOUNTER — Ambulatory Visit (HOSPITAL_BASED_OUTPATIENT_CLINIC_OR_DEPARTMENT_OTHER)
Admission: RE | Admit: 2023-05-17 | Discharge: 2023-05-17 | Disposition: A | Discharge status: Home / Self Care | Payer: BC Managed Care – PPO | Source: Ambulatory Visit | Attending: Family | Admitting: Family

## 2023-05-17 ENCOUNTER — Encounter (HOSPITAL_BASED_OUTPATIENT_CLINIC_OR_DEPARTMENT_OTHER): Payer: Self-pay

## 2023-05-17 DIAGNOSIS — Z1231 Encounter for screening mammogram for malignant neoplasm of breast: Secondary | ICD-10-CM | POA: Diagnosis not present

## 2023-05-18 ENCOUNTER — Encounter: Payer: Self-pay | Admitting: Professional

## 2023-05-18 ENCOUNTER — Ambulatory Visit: Payer: BC Managed Care – PPO | Admitting: Professional

## 2023-05-18 ENCOUNTER — Encounter: Payer: Self-pay | Admitting: Family

## 2023-05-18 DIAGNOSIS — F411 Generalized anxiety disorder: Secondary | ICD-10-CM

## 2023-05-18 DIAGNOSIS — F4310 Post-traumatic stress disorder, unspecified: Secondary | ICD-10-CM

## 2023-05-18 DIAGNOSIS — F331 Major depressive disorder, recurrent, moderate: Secondary | ICD-10-CM | POA: Diagnosis not present

## 2023-05-18 DIAGNOSIS — M5451 Vertebrogenic low back pain: Secondary | ICD-10-CM | POA: Diagnosis not present

## 2023-05-18 MED ORDER — CLONAZEPAM 0.5 MG PO TABS: 0.5000 mg | ORAL_TABLET | Freq: Three times a day (TID) | ORAL | 0 refills | Status: DC | PRN

## 2023-05-18 NOTE — Progress Notes (Signed)
[facility] Counselor/Therapist Progress Note  Patient ID: Shannon [name], MRN: [id number],    Date: 05/18/2023  Time Spent: 48 minutes [id number]   Treatment Type: Individual Therapy  Risk Assessment: Danger to Self:  No Self-injurious Behavior: No Danger to Others: No  Subjective: This session was held via video teletherapy. The patient consented to video teletherapy and was located in her office during this session. She is aware it is the responsibility of the patient to secure confidentiality on her end of the session. The provider was in a private home office for the duration of this session.    The patient arrived on time for the Caregility appointment.   Issues addressed: 1-personal -pt is up and down but admits that after experiencing the death of both parents in consecutive years and the holiday season  -she struggles generally with holidays -her paternal Angelique Holm calls her frequently -she shared that her aunt was outspoken about the pt's pregnancy as a youth 2-depressed this week -she has been tearful -Jan, Feb, Mar are difficult months for her related to her depression   -nothing is easier even when she is using her coping skills -grandmother's stroke -parent's dog Lily lives with [name] and [place]onna [place] struggles with anxiety since death of parents  Treatment Plan Problems Addressed  Anxiety, Childhood Trauma, Low Self-Esteem, Unipolar Depression Goals 1. Alleviate depressive symptoms and return to previous level of effective functioning. 2. Appropriately grieve the loss in order to normalize mood and to return to previously adaptive level of functioning. Objective Learn and implement behavioral strategies to overcome depression. Target Date: 2023-05-24 Frequency: Biweekly  Progress: 0 Modality: individual  Related Interventions Engage the client in "behavioral activation," increasing his/her activity level and contact with sources of reward, while  identifying processes that inhibit activation (see Behavioral Activation for Depression by Katharine Look, Dimidjian, and Herman-Dunn; or assign "Identify and Schedule Pleasant Activities" in the Adult Psychotherapy Homework Planner by [facility]); use behavioral techniques such as instruction, rehearsal, role-playing, role reversal, as needed, to facilitate activity in the client's daily life; reinforce success. Assist the client in developing skills that increase the likelihood of deriving pleasure from behavioral activation (e.g., assertiveness skills, developing an exercise plan, less internal/more external focus, increased social involvement); reinforce success. Objective Learn and implement conflict resolution skills to resolve interpersonal problems. Target Date: 2023-05-24 Frequency: Biweekly  Progress: 0 Modality: individual  Related Interventions Teach conflict resolution skills (e.g., empathy, active listening, "I messages," respectful communication, assertiveness without aggression, compromise); use psychoeducation, modeling, role-playing, and rehearsal to work through several current conflicts; assign homework exercises; review and repeat so as to integrate their use into the client's life. Objective Identify important people in life, past and present, and describe the quality, good and poor, of those relationships. Target Date: 2023-05-24 Frequency: Biweekly  Progress: 0 Modality: individual  Objective Learn and implement relapse prevention skills. Target Date: 2023-05-24 Frequency: Biweekly  Progress: 0 Modality: individual  Related Interventions Identify and rehearse with the client the management of future situations or circumstances in which lapses could occur. Objective Verbalize an understanding of healthy and unhealthy emotions with the intent of increasing the use of healthy emotions to guide actions. Target Date: 2023-05-24 Frequency: Biweekly  Progress: 0 Modality: individual   Related Interventions Use a process-experiential approach consistent with Emotion-Focused Therapy to create a safe, nurturing environment in which the client can process emotions, learning to identify and regulate unhealthy feelings and to generate more adaptive ones that then guide actions (see Emotion-Focused Therapy for  Depression by Peter Minium). 3. Demonstrate improved self-esteem through more pride in appearance, more assertiveness, greater eye contact, and identification of positive traits in self-talk messages. 4. Develop an awareness of how childhood issues have affected and continue to affect one's family life. 5. Develop healthy interpersonal relationships that lead to the alleviation and help prevent the relapse of depression. 6. Develop healthy thinking patterns and beliefs about self, others, and the world that lead to the alleviation and help prevent the relapse of depression. 7. Elevate self-esteem. Objective Demonstrate an increased ability to identify and express personal feelings. Target Date: 2023-05-24 Frequency: Biweekly  Progress: 0 Modality: individual  Related Interventions Assist the client in identifying and labeling emotions. Objective Acknowledge feeling less competent than most others. Target Date: 2023-05-24 Frequency: Biweekly  Progress: 0 Modality: individual  Related Interventions Explore the client's assessment of himself/herself and what is verbalized as the basis for negative self-perception. Actively build the level of trust with the client in individual sessions through consistent eye contact, active listening, unconditional positive regard, and warm acceptance to help increase his/her ability to identify and express feelings. Objective Decrease the frequency of negative self-descriptive statements and increase frequency of positive self-descriptive statements. Target Date: 2023-05-24 Frequency: Biweekly  Progress: 0 Modality: individual   Related Interventions Assist the client in becoming aware of how he/she expresses or acts out negative feelings about himself/herself. Help the client reframe his/her negative assessment of himself/herself. Assist the client in developing positive self-talk as a way of boosting his/her confidence and self-image (or assign "Positive Self-Talk" in the Adult Psychotherapy Homework Planner by Stephannie Li). Objective Identify and engage in activities that would improve self-image by being consistent with one's values. Target Date: 2023-05-24 Frequency: Biweekly  Progress: 0 Modality: individual  Related Interventions Help the client analyze his/her values and the congruence or incongruence between them and the client's daily activities. Identify and assign activities congruent with the client's values; process them toward improving self-concept and self-esteem. Objective Identify positive traits and talents about self. Target Date: 2023-05-24 Frequency: Biweekly  Progress: 0 Modality: individual  Related Interventions Assign the client the exercise of identifying his/her positive physical characteristics in a mirror to help him/her become more comfortable with himself/herself. Ask the client to keep building a list of positive traits and have him/her read the list at the beginning and end of each session (or assign "Acknowledging My Strengths" or "What Are My Good Qualities?" in the Adult Psychotherapy Homework Planner by [facility]); reinforce the client's positive self-descriptive statements. 8. Enhance ability to effectively cope with the full variety of life's worries and anxieties. 9. Establish an inward sense of self-worth, confidence, and competence. 10. Interact socially without undue distress or disability. 11. Learn and implement coping skills that result in a reduction of anxiety and worry, and improved daily functioning. 12. Let go of blame and begin to forgive others for pain caused in  childhood. Objective Describe what it was like to grow up in the home environment. Target Date: 2023-05-24 Frequency: Biweekly  Progress: 0 Modality: individual  Related Interventions Develop the client's family genogram and/or symptom line and help identify patterns of dysfunction within the family. Objective Increase level of trust of others as shown by more socialization and greater intimacy tolerance. Target Date: 2023-05-24 Frequency: Biweekly  Progress: 0 Modality: individual  Related Interventions Teach the client the share-check method of building trust in relationships (sharing a little information and checking as to the recipient's sensitivity in reacting to that information). Teach the  client the advantages of treating people as trustworthy given a reasonable amount of time to assess their character. Objective Describe each family member and identify the role each played within the family. Target Date: 2023-05-24 Frequency: Biweekly  Progress: 0 Modality: individual  Related Interventions Assist the client in clarifying his/her role within the family and his/her feelings connected to that role. Objective Identify feelings associated with major traumatic incidents in childhood and with parental child-rearing patterns. Target Date: 2023-05-24 Frequency: Biweekly  Progress: 0 Modality: individual  Related Interventions Support and encourage the client when he/she begins to express feelings of rage, sadness, fear, and rejection relating to family abuse or neglect. Assign the client to record feelings in a journal that describes memories, behavior, and emotions tied to his/her traumatic childhood experiences (or assign "How the Trauma Affects Me" in the Adult Psychotherapy Homework Planner by Stephannie Li). Ask the client to read books on the emotional effects of neglect and abuse in childhood (e.g., It Will Never Happen to Me by [facility]; Outgrowing the Pain by Bronson Curb; Healing the Child Within  by [facility]); process insights attained. 13. Reduce overall frequency, intensity, and duration of the anxiety so that daily functioning is not impaired. Objective Reestablish a consistent sleep-wake cycle. Target Date: 2023-05-24 Frequency: Biweekly  Progress: 0 Modality: individual  Related Interventions Teach and implement sleep hygiene practices to help the client reestablish a consistent sleep-wake cycle; review, reinforce success, and provide corrective feedback toward improvement. Objective Identify, challenge, and replace biased, fearful self-talk with positive, realistic, and empowering self-talk. Target Date: 2023-05-24 Frequency: Biweekly  Progress: 0 Modality: individual  Related Interventions Explore the client's schema and self-talk that mediate his/her fear response; assist him/her in challenging the biases; replace the distorted messages with reality-based alternatives and positive, realistic self-talk that will increase his/her self-confidence in coping with irrational fears (see Cognitive Therapy of Anxiety Disorders by Laurence Slate). Assign the client a homework exercise in which he/she identifies fearful self-talk, identifies biases in the self-talk, generates alternatives, and tests through behavioral experiments (or assign "Negative Thoughts Trigger Negative Feelings" in the Adult Psychotherapy Homework Planner by [facility]); review and reinforce success, providing corrective feedback toward improvement. Objective Learn and implement problem-solving strategies for realistically addressing worries. Target Date: 2023-05-24 Frequency: Biweekly  Progress: 0 Modality: individual  Related Interventions Teach the client problem-solving strategies involving specifically defining a problem, generating options for addressing it, evaluating the pros and cons of each option, selecting and implementing an optional action, and reevaluating and refining the action (or assign "Applying  Problem-Solving to Interpersonal Conflict" in the Adult Psychotherapy Homework Planner by Stephannie Li). Objective Learn and implement calming skills to reduce overall anxiety and manage anxiety symptoms. Target Date: 2023-05-24 Frequency: Biweekly  Progress: 0 Modality: individual  Related Interventions Teach the client calming/relaxation skills (e.g., applied relaxation, progressive muscle relaxation, cue controlled relaxation; mindful breathing; biofeedback) and how to discriminate better between relaxation and tension; teach the client how to apply these skills to his/her daily life (e.g., New Directions in Progressive Muscle Relaxation by Marcelyn Ditty, and Hazlett-Stevens; Treating Generalized Anxiety Disorder by Rygh and Ida Rogue). Assign the client to read about progressive muscle relaxation and other calming strategies in relevant books or treatment manuals (e.g., Progressive Relaxation Training by Robb Matar and Alen Blew; Mastery of Your Anxiety and Worry: Workbook by Earlie Counts). Objective Learn and implement relapse prevention strategies for managing possible future anxiety symptoms. Target Date: 2023-05-24 Frequency: Biweekly  Progress: 0 Modality: individual  Related Interventions Discuss with the client the  distinction between a lapse and relapse, associating a lapse with an initial and reversible return of worry, anxiety symptoms, or urges to avoid, and relapse with the decision to continue the fearful and avoidant patterns. Identify and rehearse with the client the management of future situations or circumstances in which lapses could occur. Instruct the client to routinely use new therapeutic skills (e.g., relaxation, cognitive restructuring, exposure, and problem-solving) in daily life to address emergent worries, anxiety, and avoidant tendencies. Develop a "coping card" on which coping strategies and other important information (e.g., "Breathe deeply and relax," "Challenge  unrealistic worries," "Use problem-solving") are written for the client's later use. Objective Identify the major life conflicts from the past and present that form the basis for present anxiety. Target Date: 2023-05-24 Frequency: Biweekly  Progress: 0 Modality: individual  Related Interventions Reinforce the client's insights into the role of his/her past emotional pain and present anxiety. Ask the client to develop and process a list of key past and present life conflicts that continue to cause worry. Assist the client in becoming aware of key unresolved life conflicts and in starting to work toward their resolution. 14. Release the emotions associated with past childhood/family issues, resulting in less resentment and more serenity. 15. Resolve past childhood/family issues, leading to less anger and depression, greater self-esteem, security, and confidence. 16. Resolve the core conflict that is the source of anxiety. 17. Stabilize anxiety level while increasing ability to function on a daily basis.  Diagnosis:Major depressive disorder, recurrent episode, moderate (HCC)  Generalized anxiety disorder  PTSD (post-traumatic stress disorder)  Plan:  -meet again Thursday, June 02, 2023 at 8am in-person

## 2023-05-19 ENCOUNTER — Other Ambulatory Visit: Payer: Self-pay | Admitting: Family

## 2023-05-19 ENCOUNTER — Ambulatory Visit: Payer: BC Managed Care – PPO | Admitting: Professional

## 2023-05-19 DIAGNOSIS — R928 Other abnormal and inconclusive findings on diagnostic imaging of breast: Secondary | ICD-10-CM

## 2023-05-28 ENCOUNTER — Ambulatory Visit: Payer: BC Managed Care – PPO

## 2023-05-28 ENCOUNTER — Ambulatory Visit
Admission: RE | Admit: 2023-05-28 | Discharge: 2023-05-28 | Disposition: A | Discharge status: Home / Self Care | Payer: BC Managed Care – PPO | Source: Ambulatory Visit | Attending: Family | Admitting: Family

## 2023-05-28 DIAGNOSIS — R928 Other abnormal and inconclusive findings on diagnostic imaging of breast: Secondary | ICD-10-CM | POA: Diagnosis not present

## 2023-05-30 ENCOUNTER — Ambulatory Visit: Payer: BC Managed Care – PPO | Admitting: Family

## 2023-05-30 VITALS — BP 105/69 | HR 79 | Temp 97.6°F | Resp 16 | Ht 65.0 in | Wt 118.0 lb

## 2023-05-30 DIAGNOSIS — K5900 Constipation, unspecified: Secondary | ICD-10-CM

## 2023-05-30 DIAGNOSIS — F410 Panic disorder [episodic paroxysmal anxiety] without agoraphobia: Secondary | ICD-10-CM | POA: Diagnosis not present

## 2023-05-30 DIAGNOSIS — Z79899 Other long term (current) drug therapy: Secondary | ICD-10-CM | POA: Diagnosis not present

## 2023-05-30 DIAGNOSIS — F411 Generalized anxiety disorder: Secondary | ICD-10-CM

## 2023-05-30 DIAGNOSIS — E0590 Thyrotoxicosis, unspecified without thyrotoxic crisis or storm: Secondary | ICD-10-CM

## 2023-05-30 DIAGNOSIS — F332 Major depressive disorder, recurrent severe without psychotic features: Secondary | ICD-10-CM | POA: Diagnosis not present

## 2023-05-30 NOTE — Assessment & Plan Note (Signed)
TFT's have been stable.

## 2023-05-30 NOTE — Progress Notes (Signed)
6  Subjective:     Patient ID: Shannon [name], female    DOB: August 25, 1960, 63 y.o.   MRN: [id number]  Chief Complaint  Patient presents with   Anxiety    Here for follow up    Anxiety      Discussed the use of AI scribe software for clinical note transcription with the patient, who gave verbal consent to proceed.  History of Present Illness   The patient, with a history of depression and anxiety, presents for a routine follow-up. She reports a recent episode of an unidentified skin condition, which has since resolved. The patient's dermatologist suggested it may recur with initial sun exposure.  The patient's depression is currently not well-controlled, with significant situational factors contributing. She is grieving the loss of both parents, with particular distress over the circumstances of her father's passing. She expresses difficulty with intrusive thoughts and worry, particularly regarding her older son's upcoming surgery. She has been trying relaxation techniques and reading to manage these symptoms. The patient also notes a lack of physical activity and weight gain, which she plans to address by going to the gym.  The patient is on Wellbutrin 300 and Klonopin, taken three times a day. She recently switched to a generic version of Klonopin, which she finds more effective than the brand name. She also takes Linzess daily for bowel regulation, supplemented with Miralax every other day. She declined the shingles vaccine.  The patient's thyroid studies were normal over the summer, with a small cyst noted on the left side of the thyroid, which is not a cause for concern. The patient's mammogram results were also normal. She is due for a physical in early April.          There are no preventive care reminders to display for this patient.   Past Medical History:  Diagnosis Date   Anemia    Atypical chest pain 03/08/2012   Bilateral thumb pain 01/12/2018   Bilateral wrist  pain 01/12/2018   Cervical spondylosis without myelopathy 02/12/2016   Chronic midline posterior neck pain 06/19/2015   Concussion without loss of consciousness 06/18/2017   Constipation 01/14/2021   Ecchymosis 05/25/2011   Family history of colonic polyps 12/26/2017   Generalized anxiety disorder with panic attacks 12/30/2010   Grief reaction 02/03/2022   History of eating disorder    anorexia nervosa in her 2023/06/12   Kidney stones    passed on their own   Laceration of left thumb 01/14/2021   Low back pain 03/03/2020   Lumbar radicular pain 06/19/2015   Major depressive disorder 12/23/2012   Mild neurocognitive disorder due to another medical condition 10/20/2017   Mixed obsessional thoughts and acts 03/09/2017   Osteoarthritis of carpometacarpal (CMC) joint of thumb 01/10/2020   Postlaminectomy syndrome, lumbar region 06/19/2015   Preventative health care 02/15/2012   Raynaud's disease 03/23/2013   Recurrent major depressive disorder, in partial remission (HCC) 03/09/2017   Sacroiliac dysfunction 06/19/2015   Spinal stenosis of lumbar region with radiculopathy 04/15/2022   Spondylosis of lumbar region without myelopathy or radiculopathy 07/15/2015   Stomach disorder    due to complications with cholecystectomy "almost died"    Past Surgical History:  Procedure Laterality Date   APPENDECTOMY  June 11, 1974   BREAST BIOPSY Right    CHOLECYSTECTOMY  08/2009   reports history of gallbladder polyps, she reports stents in duct of lushca    COLONOSCOPY  06-11-20   HYSTEROSCOPY  07/2011   LAMINECTOMY  1994  L4-5   LUMBAR LAMINECTOMY/DECOMPRESSION MICRODISCECTOMY N/A 04/15/2022   Procedure: LUMBAR THREE-FOUR LUMBAR LAMINECTOMY;  Surgeon: Jene Every, MD;  Location: [facility] OR;  Service: Orthopedics;  Laterality: N/A;  2 hrs 3 C-Bed   TONSILLECTOMY  1982   TUBAL LIGATION      Family History  Problem Relation Age of Onset   Hypertension Mother    Memory loss Mother    Dementia Mother     Alcoholism Mother    Diabetes Father    Hypertension Father    Hypothyroidism Father    CAD Father    Stroke Father    Adrenal disorder Father    Adrenal disorder Sister    Hashimoto's thyroiditis Sister    Hypothyroidism Sister    Raynaud syndrome Sister    Neuropathy Sister        chronic inflammatory demyelinating peripheral neuropathy from lyme disease   Alcohol abuse Maternal Grandmother    Cancer Paternal Grandfather        lung cancer   Hypothyroidism Other     Social History   Socioeconomic History   Marital status: Married    Spouse name: Not on file   Number of children: 3   Years of education: 15   Highest education level: Associate degree: academic program  Occupational History    Employer: SELF EMPLOYED  Tobacco Use   Smoking status: Never   Smokeless tobacco: Never  Vaping Use   Vaping status: Never Used  Substance and Sexual Activity   Alcohol use: Yes    Comment: occ.   Drug use: Not Currently   Sexual activity: Yes    Partners: Male    Birth control/protection: Surgical  Other Topics Concern   Not on file  Social History Narrative   Regular exercise:  Yes (swim instructor)   Caffeine Use:  1 daily   Married 3 children ages 58 son, 31 son, and 70 daughter   Associates degree   Right handed          Social Drivers of Corporate investment banker Strain: Not on file  Food Insecurity: Not on file  Transportation Needs: Not on file  Physical Activity: Not on file  Stress: Not on file  Social Connections: Not on file  Intimate Partner Violence: Not on file    Outpatient Medications Prior to Visit  Medication Sig Dispense Refill   buPROPion (WELLBUTRIN XL) 300 MG 24 hr tablet Take 1 tablet (300 mg total) by mouth daily. 90 tablet 1   busPIRone (BUSPAR) 15 MG tablet TAKE 1 TABLET BY MOUTH TWICE A DAY 180 tablet 1   clonazePAM (KLONOPIN) 0.5 MG tablet Take 1 tablet (0.5 mg total) by mouth 3 (three) times daily as needed for anxiety. 90 tablet  0   cyanocobalamin (VITAMIN B12) 500 MCG tablet Take 500 mcg by mouth daily.     Gamma-Aminobutyric Acid (GABA PO) Take by mouth.     lidocaine (LIDODERM) 5 % Place 1 patch onto the skin daily as needed (pain). Remove & Discard patch within 12 hours or as directed by MD     LINZESS 290 MCG CAPS capsule Take 290 mcg by mouth daily.     MAGNESIUM GLYCINATE PO Take 120 mg by mouth at bedtime.     melatonin 3 MG TABS tablet Take 3 mg by mouth at bedtime.     OVER THE COUNTER MEDICATION Take 1 capsule by mouth daily. Brain Elevate     polyethylene glycol (MIRALAX / GLYCOLAX)  17 g packet Take 17 g by mouth daily. 14 each 0   pregabalin (LYRICA) 50 MG capsule Take 50 mg by mouth 2 (two) times daily.     Turmeric (QC TUMERIC COMPLEX PO) Take by mouth.     CHOLINE PO Take by mouth.     Coenzyme Q10 (COQ10) 100 MG CAPS Take 100 mg by mouth daily.     Gamma-Aminobutyric Acid (GABA PO) Take 500 mg by mouth at bedtime.     Multiple Vitamin (MULTIVITAMIN) tablet Take 1 tablet by mouth daily.     No facility-administered medications prior to visit.    Allergies  Allergen Reactions   Buprenorphine Hcl     No relief   Cortisporin [Neomycin-Polymyxin-Hc] Swelling    Ear dropps   Eszopiclone     Headaches     Flexeril [Cyclobenzaprine Hcl] Other (See Comments)    Mood swings   Gabapentin     sedation   Morphine And Codeine Other (See Comments)    No relief   Nsaids Other (See Comments)    GI bleed   Paroxetine Nausea Only        Shellfish Allergy Other (See Comments)    skin & mouth tingle   Tizanidine Hcl Other (See Comments)    Dizziness, mood swings   Tolmetin     GI bleed    ROS See HPI    Objective:    Physical Exam Constitutional:      Appearance: Normal appearance.  Cardiovascular:     Rate and Rhythm: Normal rate.  Pulmonary:     Effort: Pulmonary effort is normal.  Skin:    General: Skin is warm and dry.  Neurological:     Mental Status: She is alert and oriented  to person, place, and time.  Psychiatric:        Mood and Affect: Mood normal.        Behavior: Behavior normal.        Thought Content: Thought content normal.        Judgment: Judgment normal.      BP 105/69 (BP Location: Right Arm, Patient Position: Sitting, Cuff Size: Normal)   Pulse 79   Temp 97.6 F (36.4 C) (Oral)   Resp 16   Ht 5\' 5"  (1.651 m)   Wt 118 lb (53.5 kg)   LMP 03/19/2017   SpO2 100%   BMI 19.64 kg/m  Wt Readings from Last 3 Encounters:  05/30/23 118 lb (53.5 kg)  11/22/22 113 lb 6.4 oz (51.4 kg)  07/22/22 112 lb 9.6 oz (51.1 kg)       Assessment & Plan:   Problem List Items Addressed This Visit       Unprioritized   Major depressive disorder   Fair control on wellbutrin, continues to work with Veterinary surgeon.       Hyperthyroidism   TFT's have been stable.       Generalized anxiety disorder with panic attacks   Stable with klonopin, Controlled substance contract is updated today.  Update drug screen.       Constipation   Stable with daily linzess and every other day miralax. Continue same.       Other Visit Diagnoses       High risk medication use    -  Primary   Relevant Orders   DRUG MONITORING, PANEL 8 WITH CONFIRMATION, URINE   Clonazepam level       I have discontinued Rainy Trulson's multivitamin, CoQ10, and CHOLINE  PO. I am also having her maintain her Linzess, melatonin, MAGNESIUM GLYCINATE PO, OVER THE COUNTER MEDICATION, cyanocobalamin, lidocaine, polyethylene glycol, pregabalin, Turmeric (QC TUMERIC COMPLEX PO), Gamma-Aminobutyric Acid (GABA PO), busPIRone, buPROPion, and clonazePAM.  No orders of the defined types were placed in this encounter.

## 2023-05-30 NOTE — Addendum Note (Signed)
Addended by: Mervin Kung A on: 05/30/2023 11:45 AM   Modules accepted: Orders

## 2023-05-30 NOTE — Assessment & Plan Note (Signed)
Stable with daily linzess and every other day miralax. Continue same.

## 2023-05-30 NOTE — Assessment & Plan Note (Addendum)
Stable with klonopin, Controlled substance contract is updated today.  Update drug screen.

## 2023-05-30 NOTE — Assessment & Plan Note (Signed)
>>  ASSESSMENT AND PLAN FOR MAJOR DEPRESSIVE DISORDER WRITTEN ON 05/30/2023  8:52 AM BY O'SULLIVAN, Michiel Sivley, NP  Fair control on wellbutrin , continues to work with counselor.

## 2023-05-30 NOTE — Patient Instructions (Signed)
VISIT SUMMARY:  During today's visit, we discussed your ongoing health concerns, including your depression, chronic constipation, and general health maintenance. We reviewed your current medications and recent test results, and we made plans to address your symptoms and overall well-being.  YOUR PLAN:  -DEPRESSION: Depression is a mental health condition characterized by persistent feelings of sadness and loss of interest. Your symptoms have worsened due to situational factors and grief. We will continue your current medications, Wellbutrin xl 300mg  and Klonopin three times a day. You have found some relief with relaxation techniques and plan to increase physical activity. If your symptoms persist or worsen, consider seeking additional mental health support.  -CHRONIC CONSTIPATION: Chronic constipation is a condition where you have infrequent bowel movements or difficulty passing stools. You are managing this with Linzess daily and Miralax every other day, and you report good control with this regimen. Continue with your current regimen.  -GENERAL HEALTH MAINTENANCE: Your recent mammogram results were normal, and your thyroid studies over the summer were also normal, with a small cyst on the left side of the thyroid that is not a cause for concern. You have a physical exam scheduled for early April 2025. Today, we will perform a urine drug screen and check your blood Klonopin level.  INSTRUCTIONS:  Please continue taking your medications as prescribed and follow your current regimen for managing constipation. If your depression symptoms persist or worsen, consider seeking additional mental health support. Remember to attend your physical exam in early April 2025. Today, we will perform a urine drug screen and check your blood Klonopin level.

## 2023-05-30 NOTE — Assessment & Plan Note (Signed)
Fair control on wellbutrin, continues to work with Veterinary surgeon.

## 2023-05-31 DIAGNOSIS — M5451 Vertebrogenic low back pain: Secondary | ICD-10-CM | POA: Diagnosis not present

## 2023-05-31 DIAGNOSIS — M25552 Pain in left hip: Secondary | ICD-10-CM | POA: Diagnosis not present

## 2023-05-31 DIAGNOSIS — M25551 Pain in right hip: Secondary | ICD-10-CM | POA: Diagnosis not present

## 2023-06-02 ENCOUNTER — Ambulatory Visit: Payer: BC Managed Care – PPO | Admitting: Professional

## 2023-06-02 ENCOUNTER — Encounter: Payer: Self-pay | Admitting: Professional

## 2023-06-02 ENCOUNTER — Encounter: Payer: Self-pay | Admitting: Family

## 2023-06-02 DIAGNOSIS — F331 Major depressive disorder, recurrent, moderate: Secondary | ICD-10-CM | POA: Diagnosis not present

## 2023-06-02 DIAGNOSIS — F4310 Post-traumatic stress disorder, unspecified: Secondary | ICD-10-CM | POA: Diagnosis not present

## 2023-06-02 DIAGNOSIS — F411 Generalized anxiety disorder: Secondary | ICD-10-CM

## 2023-06-02 LAB — DRUG TOX MONITOR 1 W/CONF, ORAL FLD
Alprazolam: NEGATIVE ng/mL (ref <0.50)
Aminoclonazepam: 12.07 ng/mL — ABNORMAL HIGH (ref <0.50)
Amphetamines: NEGATIVE ng/mL (ref <10)
Barbiturates: NEGATIVE ng/mL (ref <10)
Benzodiazepines: POSITIVE ng/mL — AB (ref <0.50)
Buprenorphine: NEGATIVE ng/mL (ref <0.10)
Chlordiazepoxide: NEGATIVE ng/mL (ref <0.50)
Clonazepam: 3.68 ng/mL — ABNORMAL HIGH (ref <0.50)
Cocaine: NEGATIVE ng/mL (ref <5.0)
Diazepam: NEGATIVE ng/mL (ref <0.50)
Fentanyl: NEGATIVE ng/mL (ref <0.10)
Flunitrazepam: NEGATIVE ng/mL (ref <0.50)
Flurazepam: NEGATIVE ng/mL (ref <0.50)
Heroin Metabolite: NEGATIVE ng/mL (ref <1.0)
Lorazepam: NEGATIVE ng/mL (ref <0.50)
MARIJUANA: NEGATIVE ng/mL (ref <2.5)
MDMA: NEGATIVE ng/mL (ref <10)
Meprobamate: NEGATIVE ng/mL (ref <2.5)
Methadone: NEGATIVE ng/mL (ref <5.0)
Midazolam: NEGATIVE ng/mL (ref <0.50)
Nicotine Metabolite: NEGATIVE ng/mL (ref <5.0)
Nordiazepam: NEGATIVE ng/mL (ref <0.50)
Opiates: NEGATIVE ng/mL (ref <2.5)
Oxazepam: NEGATIVE ng/mL (ref <0.50)
Phencyclidine: NEGATIVE ng/mL (ref <10)
Tapentadol: NEGATIVE ng/mL (ref <5.0)
Temazepam: NEGATIVE ng/mL (ref <0.50)
Tramadol: >500 ng/mL — ABNORMAL HIGH (ref <5.0)
Tramadol: POSITIVE ng/mL — AB (ref <5.0)
Triazolam: NEGATIVE ng/mL (ref <0.50)
Zolpidem: NEGATIVE ng/mL (ref <5.0)

## 2023-06-02 LAB — CLONAZEPAM LEVEL: Clonazepam Lvl: 29 ug/L — ABNORMAL LOW (ref 30–60)

## 2023-06-02 NOTE — Progress Notes (Signed)
[facility] Counselor/Therapist Progress Note  Patient ID: Shannon [name], MRN: [id number],    Date: 06/02/2023  Time Spent: 50 minutes [id number]   Treatment Type: Individual Therapy  Risk Assessment: Danger to Self:  No Self-injurious Behavior: No Danger to Others: No  Subjective: This session was held via video teletherapy. The patient consented to video teletherapy and was located in her office during this session. She is aware it is the responsibility of the patient to secure confidentiality on her end of the session. The provider was in a private home office for the duration of this session.    The patient arrived on time for the Caregility appointment.   Issues addressed: 1-self-care -had a very good time at the beach 2-grief -hit very hard yesterday when at [facility] for son [name]'s OPT surgery -pt sat in the surgical waiting area that overlooked the choppers that were coming in -pt admits that she kept reliving the night her father was brought in and died -she struggles with daily grief 3-treatment plan reviewed and updated  Treatment Plan Problems Addressed  Anxiety, Childhood Trauma, Low Self-Esteem, Unipolar Depression Goals 1. Alleviate depressive symptoms and return to previous level of effective functioning. 2. Appropriately grieve the loss in order to normalize mood and to return to previously adaptive level of functioning. Objective Learn and implement behavioral strategies to overcome depression. Target Date: 2024-06-01 Frequency: Biweekly  Progress: 60 Modality: individual  Related Interventions Engage the client in "behavioral activation," increasing his/her activity level and contact with sources of reward, while identifying processes that inhibit activation (see Behavioral Activation for Depression by Katharine Look, Dimidjian, and Herman-Dunn; or assign "Identify and Schedule Pleasant Activities" in the Adult Psychotherapy Homework Planner  by [facility]); use behavioral techniques such as instruction, rehearsal, role-playing, role reversal, as needed, to facilitate activity in the client's daily life; reinforce success. Assist the client in developing skills that increase the likelihood of deriving pleasure from behavioral activation (e.g., assertiveness skills, developing an exercise plan, less internal/more external focus, increased social involvement); reinforce success. Objective Learn and implement conflict resolution skills to resolve interpersonal problems. Target Date: 2024-06-01 Frequency: Biweekly  Progress: 70 Modality: individual  Related Interventions Teach conflict resolution skills (e.g., empathy, active listening, "I messages," respectful communication, assertiveness without aggression, compromise); use psychoeducation, modeling, role-playing, and rehearsal to work through several current conflicts; assign homework exercises; review and repeat so as to integrate their use into the client's life. Objective Learn and implement relapse prevention skills. Target Date: 2024-06-01 Frequency: Biweekly  Progress: 40 Modality: individual  Related Interventions Identify and rehearse with the client the management of future situations or circumstances in which lapses could occur. Objective Verbalize an understanding of healthy and unhealthy emotions with the intent of increasing the use of healthy emotions to guide actions. Target Date: 2024-06-01 Frequency: Biweekly  Progress: 80 Modality: individual  Related Interventions Use a process-experiential approach consistent with Emotion-Focused Therapy to create a safe, nurturing environment in which the client can process emotions, learning to identify and regulate unhealthy feelings and to generate more adaptive ones that then guide actions (see Emotion-Focused Therapy for Depression by Peter Minium). 3. Demonstrate improved self-esteem through more pride in appearance,  more assertiveness, greater eye contact, and identification of positive traits in self-talk messages. 4. Develop an awareness of how childhood issues have affected and continue to affect one's family life. 5. Develop healthy interpersonal relationships that lead to the alleviation and help prevent the relapse of depression. 6. Develop healthy thinking patterns  and beliefs about self, others, and the world that lead to the alleviation and help prevent the relapse of depression. 7. Elevate self-esteem. Objective Demonstrate an increased ability to identify and express personal feelings. Target Date: 2024-06-01 Frequency: Biweekly  Progress: 50 Modality: individual  Related Interventions Assist the client in identifying and labeling emotions. Objective Acknowledge feeling less competent than most others. Target Date: 06-01-2024 Frequency: Biweekly  Progress: 70 Modality: individual  Related Interventions Explore the client's assessment of himself/herself and what is verbalized as the basis for negative self-perception. Actively build the level of trust with the client in individual sessions through consistent eye contact, active listening, unconditional positive regard, and warm acceptance to help increase his/her ability to identify and express feelings. Objective Decrease the frequency of negative self-descriptive statements and increase frequency of positive self-descriptive statements. Target Date: 2024-06-01 Frequency: Biweekly  Progress: 40 Modality: individual  Related Interventions Assist the client in becoming aware of how he/she expresses or acts out negative feelings about himself/herself. Help the client reframe his/her negative assessment of himself/herself. Assist the client in developing positive self-talk as a way of boosting his/her confidence and self-image (or assign "Positive Self-Talk" in the Adult Psychotherapy Homework Planner by Stephannie Li). Objective Identify and engage  in activities that would improve self-image by being consistent with one's values. Target Date: 2024-06-01 Frequency: Biweekly  Progress: 60 Modality: individual  Related Interventions Help the client analyze his/her values and the congruence or incongruence between them and the client's daily activities. Identify and assign activities congruent with the client's values; process them toward improving self-concept and self-esteem. Objective Identify positive traits and talents about self. Target Date: 2024-06-01 Frequency: Biweekly  Progress: 40 Modality: individual  Related Interventions Assign the client the exercise of identifying his/her positive physical characteristics in a mirror to help him/her become more comfortable with himself/herself. Ask the client to keep building a list of positive traits and have him/her read the list at the beginning and end of each session (or assign "Acknowledging My Strengths" or "What Are My Good Qualities?" in the Adult Psychotherapy Homework Planner by [facility]); reinforce the client's positive self-descriptive statements. 8. Enhance ability to effectively cope with the full variety of life's worries and anxieties. 9. Establish an inward sense of self-worth, confidence, and competence. 10. Interact socially without undue distress or disability. 11. Learn and implement coping skills that result in a reduction of anxiety and worry, and improved daily functioning. 12. Let go of blame and begin to forgive others for pain caused in childhood. Objective Increase level of trust of others as shown by more socialization and greater intimacy tolerance. Target Date: 2024-06-01 Frequency: Biweekly  Progress: 60 Modality: individual  Related Interventions Teach the client the share-check method of building trust in relationships (sharing a little information and checking as to the recipient's sensitivity in reacting to that information). Teach the client the  advantages of treating people as trustworthy given a reasonable amount of time to assess their character. Objective Describe each family member and identify the role each played within the family. Target Date: 2024-06-01 Frequency: Biweekly  Progress: 80 Modality: individual  Related Interventions Assist the client in clarifying his/her role within the family and his/her feelings connected to that role. Objective Identify feelings associated with major traumatic incidents in childhood and with parental child-rearing patterns. Target Date: 2024-06-01 Frequency: Biweekly  Progress: 70 Modality: individual  Related Interventions Support and encourage the client when he/she begins to express feelings of rage, sadness, fear, and rejection relating to  family abuse or neglect. Assign the client to record feelings in a journal that describes memories, behavior, and emotions tied to his/her traumatic childhood experiences (or assign "How the Trauma Affects Me" in the Adult Psychotherapy Homework Planner by Stephannie Li). Ask the client to read books on the emotional effects of neglect and abuse in childhood (e.g., It Will Never Happen to Me by [facility]; Outgrowing the Pain by Bronson Curb; Healing the Child Within by [facility]); process insights attained. 13. Reduce overall frequency, intensity, and duration of the anxiety so that daily functioning is not impaired. Objective Reestablish a consistent sleep-wake cycle. Target Date: 2024-06-01 Frequency: Biweekly  Progress: 50 Modality: individual  Related Interventions Teach and implement sleep hygiene practices to help the client reestablish a consistent sleep-wake cycle; review, reinforce success, and provide corrective feedback toward improvement. Objective Identify, challenge, and replace biased, fearful self-talk with positive, realistic, and empowering self-talk. Target Date: 2024-06-01 Frequency: Biweekly  Progress: 40 Modality: individual  Related  Interventions Explore the client's schema and self-talk that mediate his/her fear response; assist him/her in challenging the biases; replace the distorted messages with reality-based alternatives and positive, realistic self-talk that will increase his/her self-confidence in coping with irrational fears (see Cognitive Therapy of Anxiety Disorders by Laurence Slate). Assign the client a homework exercise in which he/she identifies fearful self-talk, identifies biases in the self-talk, generates alternatives, and tests through behavioral experiments (or assign "Negative Thoughts Trigger Negative Feelings" in the Adult Psychotherapy Homework Planner by [facility]); review and reinforce success, providing corrective feedback toward improvement. Objective Learn and implement problem-solving strategies for realistically addressing worries. Target Date: 2023-05-24 Frequency: Biweekly  Progress: 60 Modality: individual  Related Interventions Teach the client problem-solving strategies involving specifically defining a problem, generating options for addressing it, evaluating the pros and cons of each option, selecting and implementing an optional action, and reevaluating and refining the action (or assign "Applying Problem-Solving to Interpersonal Conflict" in the Adult Psychotherapy Homework Planner by Stephannie Li). Objective Learn and implement calming skills to reduce overall anxiety and manage anxiety symptoms. Target Date: 2024-06-01 Frequency: Biweekly  Progress: 70 Modality: individual  Related Interventions Teach the client calming/relaxation skills (e.g., applied relaxation, progressive muscle relaxation, cue controlled relaxation; mindful breathing; biofeedback) and how to discriminate better between relaxation and tension; teach the client how to apply these skills to his/her daily life (e.g., New Directions in Progressive Muscle Relaxation by Marcelyn Ditty, and Hazlett-Stevens; Treating Generalized  Anxiety Disorder by Rygh and Ida Rogue). Assign the client to read about progressive muscle relaxation and other calming strategies in relevant books or treatment manuals (e.g., Progressive Relaxation Training by Robb Matar and Alen Blew; Mastery of Your Anxiety and Worry: Workbook by Earlie Counts). Objective Learn and implement relapse prevention strategies for managing possible future anxiety symptoms. Target Date: 2024-06-01 Frequency: Biweekly  Progress: 40 Modality: individual  Related Interventions Discuss with the client the distinction between a lapse and relapse, associating a lapse with an initial and reversible return of worry, anxiety symptoms, or urges to avoid, and relapse with the decision to continue the fearful and avoidant patterns. Identify and rehearse with the client the management of future situations or circumstances in which lapses could occur. Instruct the client to routinely use new therapeutic skills (e.g., relaxation, cognitive restructuring, exposure, and problem-solving) in daily life to address emergent worries, anxiety, and avoidant tendencies. Develop a "coping card" on which coping strategies and other important information (e.g., "Breathe deeply and relax," "Challenge unrealistic worries," "Use problem-solving") are written for the client's later use.  Diagnosis:Major depressive disorder, recurrent episode, moderate (HCC)  Generalized anxiety disorder  PTSD (post-traumatic stress disorder)  Plan:  -meet again Thursday, June 02, 2023 at 8am in-person

## 2023-06-03 ENCOUNTER — Other Ambulatory Visit: Payer: BC Managed Care – PPO

## 2023-06-03 ENCOUNTER — Encounter: Payer: Self-pay | Admitting: Family

## 2023-06-08 ENCOUNTER — Other Ambulatory Visit: Payer: Self-pay | Admitting: Family

## 2023-06-30 ENCOUNTER — Encounter: Payer: Self-pay | Admitting: Medical

## 2023-06-30 ENCOUNTER — Telehealth: Payer: BC Managed Care – PPO | Admitting: Medical

## 2023-06-30 VITALS — BP 110/70

## 2023-06-30 DIAGNOSIS — R059 Cough, unspecified: Secondary | ICD-10-CM | POA: Diagnosis not present

## 2023-06-30 DIAGNOSIS — H9201 Otalgia, right ear: Secondary | ICD-10-CM

## 2023-06-30 DIAGNOSIS — J0100 Acute maxillary sinusitis, unspecified: Secondary | ICD-10-CM | POA: Diagnosis not present

## 2023-06-30 MED ORDER — AZITHROMYCIN 250 MG PO TABS: ORAL_TABLET | ORAL | 0 refills | Status: AC

## 2023-06-30 MED ORDER — AZELASTINE HCL 0.1 % NA SOLN: 2.0000 | Freq: Two times a day (BID) | NASAL | 0 refills | Status: DC

## 2023-06-30 MED ORDER — BENZONATATE 100 MG PO CAPS: 100.0000 mg | ORAL_CAPSULE | Freq: Three times a day (TID) | ORAL | 0 refills | Status: DC | PRN

## 2023-06-30 NOTE — Patient Instructions (Signed)
 Upper Respiratory Infection progressing to Sinusitis Symptoms include severe headache, sinus pressure, burning in chest and throat, persistent cough, and right ear pressure and pain. No fever reported. History of similar illness three years ago. Family members recently ill with similar symptoms. -Prescribe Azithromycin 5-day course. -Prescribe Astelin nasal spray, two sprays each nostril twice a day.(on attempt to rx flonase allergy warning came up?)  Cough disturbing sleep Persistent cough causing sleep disturbance. -Prescribe Benzonatate, one tablet every eight hours as needed for cough. Maximum dose two capsules.  Follow-up If not completely better in 7-10 days, schedule in-person visit for further examination. Patient to contact office via MyChart with any problems or questions in the interim.

## 2023-06-30 NOTE — Progress Notes (Signed)
 Virtual Visit via Video Note  I connected with Shannon [name] on 06/30/23 at  1:20 PM EST by a video enabled telemedicine application and verified that I am speaking with the correct person using two identifiers.  Location: Patient: home [place] Provider: office [place].   I discussed the limitations of evaluation and management by telemedicine and the availability of in person appointments. The patient expressed understanding and agreed to proceed.  History of Present Illness: Discussed the use of AI scribe software for clinical note transcription with the patient, who gave verbal consent to proceed.  History of Present Illness   Shannon [name] is a 63 year old female who presents with symptoms of an upper respiratory infection and sinus pressure.  She has been experiencing symptoms of an upper respiratory infection for about a week and a half. Initially, she thought she was improving, but her symptoms have persisted. No fever, but she mentions occasional chills, with the last episode occurring a couple of days ago. Her symptoms began with nasal congestion, followed by sinus pressure and burning in her throat.  She experiences severe headache and sinus pressure, along with popping and crackling sensations in her face, particularly under her eyes/maxillary sinus areas, which she describes as painful. No swelling in her lymph nodes or forehead, but she notes a crunchy sensation when pressing on her cheeks.  She reports pressure and pain in her right ear, along with jaw discomfort on the same side. She does not frequently experience sinus infections and recalls a similar illness three years ago, which was associated with bronchitis and COVID-19.  She has a persistent cough that disrupts her sleep. The cough was initially loosening but has become tight again. No diffuse body aches or wheezing. The cough is likely related to the sinusitis and post-nasal drip.  She has a history of being allergic to  cortisporin ear drops, which she used three years ago. She has not been using any nasal sprays and is not currently on any antibiotics.  Her family, including her husband and son, have been sick with similar upper respiratory symptoms, and her son has a confirmed sinus infection. She has not tested for COVID-19 during this illness.          Observations/Objective: General-no acute distress, pleasant, oriented. Lungs- on inspection lungs appear unlabored. Neck- no tracheal deviation or jvd on inspection. Neuro- gross motor function appears intact.  Assessment and Plan: Assessment and Plan    Upper Respiratory Infection progressing to Sinusitis Symptoms include severe headache, sinus pressure, burning in chest and throat, persistent cough, and right ear pressure and pain. No fever reported. History of similar illness three years ago. Family members recently ill with similar symptoms. -Prescribe Azithromycin 5-day course. -Prescribe Astelin nasal spray, two sprays each nostril twice a day.(on attempt to rx flonase allergy warning came up?)  Cough disturbing sleep Persistent cough causing sleep disturbance. -Prescribe Benzonatate, one tablet every eight hours as needed for cough. Maximum dose two capsules.  Follow-up If not completely better in 7-10 days, schedule in-person visit for further examination. Patient to contact office via MyChart with any problems or questions in the interim.         Follow Up Instructions:    I discussed the assessment and treatment plan with the patient. The patient was provided an opportunity to ask questions and all were answered. The patient agreed with the plan and demonstrated an understanding of the instructions.   The patient was advised to call back or seek  an in-person evaluation if the symptoms worsen or if the condition fails to improve as anticipated.   Esperanza Richters, PA-C   Subjective:    Patient ID: Shannon [name], female     DOB: 08-Apr-1961, 63 y.o.   MRN: [id number]  HPI       Review of Systems  Constitutional:  Positive for chills and fatigue. Negative for fever.  HENT:  Positive for congestion, mouth sores, sinus pressure and sinus pain. Negative for tinnitus.   Respiratory:  Negative for cough, chest tightness and wheezing.   Cardiovascular:  Negative for chest pain and palpitations.  Gastrointestinal:  Negative for abdominal pain, constipation and nausea.  Genitourinary:  Negative for dysuria, flank pain and frequency.  Musculoskeletal:  Negative for back pain.       No leg pain.  Neurological:  Negative for tremors, numbness and headaches.  Hematological:  Negative for adenopathy.         Objective:   Physical Exam        Assessment & Plan:

## 2023-07-06 ENCOUNTER — Encounter: Payer: Self-pay | Admitting: Professional

## 2023-07-06 ENCOUNTER — Ambulatory Visit: Payer: BC Managed Care – PPO | Admitting: Professional

## 2023-07-06 DIAGNOSIS — F331 Major depressive disorder, recurrent, moderate: Secondary | ICD-10-CM | POA: Diagnosis not present

## 2023-07-06 DIAGNOSIS — F411 Generalized anxiety disorder: Secondary | ICD-10-CM | POA: Diagnosis not present

## 2023-07-06 DIAGNOSIS — F4310 Post-traumatic stress disorder, unspecified: Secondary | ICD-10-CM | POA: Diagnosis not present

## 2023-07-06 NOTE — Progress Notes (Signed)
 [facility] Counselor/Therapist Progress Note  Patient ID: Shannon [name], MRN: [id number],    Date: 07/06/2023  Time Spent: 58 minutes 8-858am   Treatment Type: Individual Therapy  Risk Assessment: Danger to Self:  No Self-injurious Behavior: No Danger to Others: No  Subjective: This session was held via video teletherapy. The patient consented to video teletherapy and was located in her office during this session. She is aware it is the responsibility of the patient to secure confidentiality on her end of the session. The provider was in a private home office for the duration of this session.    The patient arrived on time for the Caregility appointment.   Issues addressed: 1-physical -has been ups and downs -has been sick with the flu 2-pt and her children -tings are going well 3-sister [name] -has texted but they have not talked a lot 4-mood -weather changes causes her to feel so much better -one day at home that she worked on the pool and she noticed how great she felt -usually April 1st she thinks "good, it's over" -usually good after birthday -PHQ-9    07/06/2023    8:38 AM 01/19/2023    9:16 AM 07/22/2022    7:38 AM  Depression screen PHQ 2/9  Decreased Interest 3 3 2   Down, Depressed, Hopeless 2 3 2   PHQ - 2 Score 5 6 4   Altered sleeping 3 3 2   Tired, decreased energy 3 2 2   Change in appetite 3 3 3   Feeling bad or failure about yourself  1 3 2   Trouble concentrating 1 3 2   Moving slowly or fidgety/restless 0 0 2  Suicidal thoughts 0 0 0  PHQ-9 Score 16 20 17   Difficult doing work/chores  Very difficult Somewhat difficult  Very difficult for 07/06/2023 -pt notices she is improving -encouraged pt to spend time with spouse and share her gains 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 Alyce Pagan who passed last week   -she said she hasn't seen her Dad and he had no -it jarred her when she woke up but did not stop her for long -"I don't feel like they're gone" -she has evidences  her parents presence 7-spirituality -pt has not been in [employer] -she believes she is ready to try a different church -her husband would attend a [employer]  Treatment Plan Problems Addressed  Anxiety, Childhood Trauma, Low Self-Esteem, Unipolar Depression Goals 1. Alleviate depressive symptoms and return to previous level of effective functioning. 2. Appropriately grieve the loss in order to normalize mood and to return to previously adaptive level of functioning. Objective Learn and implement behavioral strategies to overcome depression. Target Date: 2024-06-01 Frequency: Biweekly  Progress: 60 Modality: individual  Related Interventions Engage the client in "behavioral activation," increasing his/her activity level and contact with sources of reward, while identifying processes that inhibit activation (see Behavioral Activation for Depression by Katharine Look, Dimidjian, and Herman-Dunn; or assign "Identify and Schedule Pleasant Activities" in the Adult Psychotherapy Homework Planner by [facility]); use behavioral techniques such as instruction, rehearsal, role-playing, role reversal, as needed, to facilitate activity in the client's daily life; reinforce success.  Assist the client in developing skills that increase the likelihood of deriving pleasure from behavioral activation (e.g., assertiveness skills, developing an exercise plan, less internal/more external focus, increased social involvement); reinforce success. Objective Learn and implement conflict resolution skills to  resolve interpersonal problems. Target Date: 2024-06-01 Frequency: Biweekly  Progress: 70 Modality: individual  Related Interventions Teach conflict resolution skills (e.g., empathy, active listening, "I messages," respectful communication, assertiveness without aggression, compromise); use psychoeducation, modeling, role-playing, and rehearsal to work through several current conflicts; assign homework exercises; review and repeat so as to integrate their use into the client's life. Objective Learn and implement relapse prevention skills. Target Date: 2024-06-01 Frequency: Biweekly  Progress: 40 Modality: individual  Related Interventions Identify and rehearse with the client the management of future situations or circumstances in which lapses could occur. Objective Verbalize an understanding of healthy and unhealthy emotions with the intent of increasing the use of healthy emotions to guide actions. Target Date: 2024-06-01 Frequency: Biweekly  Progress: 80 Modality: individual  Related Interventions Use a process-experiential approach consistent with Emotion-Focused Therapy to create a safe, nurturing environment in which the client can process emotions, learning to identify and regulate unhealthy feelings and to generate more adaptive ones that then guide actions (see Emotion-Focused Therapy for Depression by Peter Minium). 3. Demonstrate improved self-esteem through more pride in appearance, more assertiveness, greater eye contact, and identification of positive traits in self-talk messages. 4. Develop an awareness of how childhood issues have affected and continue to affect one's family life. 5. Develop healthy interpersonal relationships that lead to the alleviation and help prevent the relapse of depression. 6. Develop healthy thinking patterns and beliefs about self, others, and the world that lead to the alleviation and help prevent the relapse of depression. 7. Elevate  self-esteem. Objective Demonstrate an increased ability to identify and express personal feelings. Target Date: 2024-06-01 Frequency: Biweekly  Progress: 50 Modality: individual  Related Interventions Assist the client in identifying and labeling emotions. Objective Acknowledge feeling less competent than most others. Target Date: 06-01-2024 Frequency: Biweekly  Progress: 70 Modality: individual  Related Interventions Explore the client's assessment of himself/herself and what is verbalized as the basis for negative self-perception. Actively build the level of trust with the client in individual sessions through consistent eye contact, active listening, unconditional positive regard, and warm acceptance to help increase his/her ability to identify and express feelings. Objective Decrease the frequency of negative self-descriptive statements and increase frequency of positive self-descriptive statements. Target Date: 2024-06-01 Frequency: Biweekly  Progress: 40 Modality: individual  Related Interventions Assist the client in becoming aware of how he/she expresses or acts out negative feelings about himself/herself. Help the client reframe his/her negative assessment of himself/herself. Assist the client in developing positive self-talk as a way of boosting his/her confidence and self-image (or assign "Positive Self-Talk" in the Adult Psychotherapy Homework Planner by Stephannie Li). Objective Identify and engage in activities that would improve self-image by being consistent with one's values. Target Date: 2024-06-01 Frequency: Biweekly  Progress: 60 Modality: individual  Related Interventions Help the client analyze his/her values and the congruence or incongruence between them and the client's daily activities. Identify and assign activities congruent with the client's values; process them toward improving self-concept and self-esteem. Objective Identify positive traits and talents about  self. Target Date: 2024-06-01 Frequency: Biweekly  Progress: 40 Modality: individual  Related Interventions Assign the client the exercise of identifying his/her positive physical characteristics in a mirror to help him/her become more comfortable with himself/herself. Ask the  client to keep building a list of positive traits and have him/her read the list at the beginning and end of each session (or assign "Acknowledging My Strengths" or "What Are My Good Qualities?" in the Adult Psychotherapy Homework Planner by [facility]); reinforce the client's positive self-descriptive statements. 8. Enhance ability to effectively cope with the full variety of life's worries and anxieties. 9. Establish an inward sense of self-worth, confidence, and competence. 10. Interact socially without undue distress or disability. 11. Learn and implement coping skills that result in a reduction of anxiety and worry, and improved daily functioning. 12. Let go of blame and begin to forgive others for pain caused in childhood. Objective Increase level of trust of others as shown by more socialization and greater intimacy tolerance. Target Date: 2024-06-01 Frequency: Biweekly  Progress: 60 Modality: individual  Related Interventions Teach the client the share-check method of building trust in relationships (sharing a little information and checking as to the recipient's sensitivity in reacting to that information). Teach the client the advantages of treating people as trustworthy given a reasonable amount of time to assess their character. Objective Describe each family member and identify the role each played within the family. Target Date: 2024-06-01 Frequency: Biweekly  Progress: 80 Modality: individual  Related Interventions Assist the client in clarifying his/her role within the family and his/her feelings connected to that role. Objective Identify feelings associated with major traumatic incidents in childhood and  with parental child-rearing patterns. Target Date: 2024-06-01 Frequency: Biweekly  Progress: 70 Modality: individual  Related Interventions Support and encourage the client when he/she begins to express feelings of rage, sadness, fear, and rejection relating to family abuse or neglect. Assign the client to record feelings in a journal that describes memories, behavior, and emotions tied to his/her traumatic childhood experiences (or assign "How the Trauma Affects Me" in the Adult Psychotherapy Homework Planner by Stephannie Li). Ask the client to read books on the emotional effects of neglect and abuse in childhood (e.g., It Will Never Happen to Me by [facility]; Outgrowing the Pain by Bronson Curb; Healing the Child Within by [facility]); process insights attained. 13. Reduce overall frequency, intensity, and duration of the anxiety so that daily functioning is not impaired. Objective Reestablish a consistent sleep-wake cycle. Target Date: 2024-06-01 Frequency: Biweekly  Progress: 50 Modality: individual  Related Interventions Teach and implement sleep hygiene practices to help the client reestablish a consistent sleep-wake cycle; review, reinforce success, and provide corrective feedback toward improvement. Objective Identify, challenge, and replace biased, fearful self-talk with positive, realistic, and empowering self-talk. Target Date: 2024-06-01 Frequency: Biweekly  Progress: 40 Modality: individual  Related Interventions Explore the client's schema and self-talk that mediate his/her fear response; assist him/her in challenging the biases; replace the distorted messages with reality-based alternatives and positive, realistic self-talk that will increase his/her self-confidence in coping with irrational fears (see Cognitive Therapy of Anxiety Disorders by Laurence Slate). Assign the client a homework exercise in which he/she identifies fearful self-talk, identifies biases in the self-talk, generates  alternatives, and tests through behavioral experiments (or assign "Negative Thoughts Trigger Negative Feelings" in the Adult Psychotherapy Homework Planner by [facility]); review and reinforce success, providing corrective feedback toward improvement. Objective Learn and implement problem-solving strategies for realistically addressing worries. Target Date: 2023-05-24 Frequency: Biweekly  Progress: 60 Modality: individual  Related Interventions Teach the client problem-solving strategies involving specifically defining a problem, generating options for addressing it, evaluating the pros and cons of each option, selecting and implementing an optional action, and reevaluating  and refining the action (or assign "Applying Problem-Solving to Interpersonal Conflict" in the Adult Psychotherapy Homework Planner by Stephannie Li). Objective Learn and implement calming skills to reduce overall anxiety and manage anxiety symptoms. Target Date: 2024-06-01 Frequency: Biweekly  Progress: 70 Modality: individual  Related Interventions Teach the client calming/relaxation skills (e.g., applied relaxation, progressive muscle relaxation, cue controlled relaxation; mindful breathing; biofeedback) and how to discriminate better between relaxation and tension; teach the client how to apply these skills to his/her daily life (e.g., New Directions in Progressive Muscle Relaxation by Marcelyn Ditty, and Hazlett-Stevens; Treating Generalized Anxiety Disorder by Rygh and Ida Rogue). Assign the client to read about progressive muscle relaxation and other calming strategies in relevant books or treatment manuals (e.g., Progressive Relaxation Training by Robb Matar and Alen Blew; Mastery of Your Anxiety and Worry: Workbook by Earlie Counts). Objective Learn and implement relapse prevention strategies for managing possible future anxiety symptoms. Target Date: 2024-06-01 Frequency: Biweekly  Progress: 40 Modality: individual   Related Interventions Discuss with the client the distinction between a lapse and relapse, associating a lapse with an initial and reversible return of worry, anxiety symptoms, or urges to avoid, and relapse with the decision to continue the fearful and avoidant patterns. Identify and rehearse with the client the management of future situations or circumstances in which lapses could occur. Instruct the client to routinely use new therapeutic skills (e.g., relaxation, cognitive restructuring, exposure, and problem-solving) in daily life to address emergent worries, anxiety, and avoidant tendencies. Develop a "coping card" on which coping strategies and other important information (e.g., "Breathe deeply and relax," "Challenge unrealistic worries," "Use problem-solving") are written for the client's later use.  Diagnosis:Major depressive disorder, recurrent episode, moderate (HCC)  Generalized anxiety disorder  PTSD (post-traumatic stress disorder)  Plan:  -meet again Thursday, July 21, 2023 at 8am in-person

## 2023-07-11 ENCOUNTER — Encounter: Payer: Self-pay | Admitting: Family

## 2023-07-11 ENCOUNTER — Other Ambulatory Visit: Payer: Self-pay | Admitting: Family

## 2023-07-11 MED ORDER — BUPROPION HCL ER (XL) 150 MG PO TB24: 150.0000 mg | ORAL_TABLET | Freq: Every day | ORAL | 0 refills | Status: DC

## 2023-07-14 ENCOUNTER — Other Ambulatory Visit: Payer: Self-pay | Admitting: Family

## 2023-07-14 NOTE — Telephone Encounter (Signed)
 Requesting: clonazepam 0.5mg   Contract: 05/30/23 UDS: 05/30/23 Last Visit: 05/30/23 Next Visit: None Last Refill: 05/18/23 #90 and 0RF   Please Advise

## 2023-07-21 ENCOUNTER — Encounter: Payer: Self-pay | Admitting: Professional

## 2023-07-21 ENCOUNTER — Ambulatory Visit: Admitting: Professional

## 2023-07-21 DIAGNOSIS — F411 Generalized anxiety disorder: Secondary | ICD-10-CM | POA: Diagnosis not present

## 2023-07-21 DIAGNOSIS — F4310 Post-traumatic stress disorder, unspecified: Secondary | ICD-10-CM

## 2023-07-21 DIAGNOSIS — F331 Major depressive disorder, recurrent, moderate: Secondary | ICD-10-CM

## 2023-07-21 NOTE — Progress Notes (Signed)
 [facility] Counselor/Therapist Progress Note  Patient ID: Shannon [name], MRN: [id number],    Date: 07/21/2023  Time Spent: 59 minutes 806-905am   Treatment Type: Individual Therapy  Risk Assessment: Danger to Self:  No Self-injurious Behavior: No Danger to Others: No  Subjective: This session was held via video teletherapy. The patient consented to video teletherapy and was located in her office during this session. She is aware it is the responsibility of the patient to secure confidentiality on her end of the session. The provider was in a private home office for the duration of this session.    The patient arrived on time for the Caregility appointment.   Issues addressed: 1-mood a-depressed, tearful, unmotivated, unworthy b-pt decided to taper off her 300mg  Wellbutrin -initially pt stated she was feeling better and thought it was a good time since it was spring -after further discussion, pt admitted that she found pictures of other women on his phone (fully clothed) 2-marital a-pt admits she never imagined her spouse would ever have pictures of any woman on his phone other than her b-pt innocently found when taking a picture of him with his dog Shannon [name] who is getting up in age -pt scrolled backward see the picture and accidentally scrolled past it and saw the first picture of a woman and then scrolled back two additional times before he grabbed her phone -he denied an other pictures or any other behaviors -pt looked on his computer and found nothing that would indicate infidelity -pt feels she was cheated on emotionally 3-self-esteem a-pt admits that pictures of other women on Shannon [name]'s phone has caused increased issues -she is feeling more self-conscious of her weight and appearance -she has been referred to as skeleton skinny by her family -pt admits that when she is depressed food makes her sick -questioned her need for some counseling with an eating  disorder specialist 4-reframing -food is fuel to help our bodies function -protein provides energy and is necessary 5-treatment needs -suggested pt consider psychiatry evaluation for medication -suggested she consider [facility] if things do not improve and provided name of [name] -suggested pt and spouse enter marriage counseling  Treatment Plan Problems Addressed  Anxiety, Childhood Trauma, Low Self-Esteem, Unipolar Depression Goals 1. Alleviate depressive symptoms and return to previous level of effective functioning. 2. Appropriately grieve the loss in order to normalize mood and to return to previously adaptive level of functioning. Objective Learn and implement behavioral strategies to overcome depression. Target Date: 2024-06-01 Frequency: Biweekly  Progress: 60 Modality: individual  Related Interventions Engage the client in "behavioral activation," increasing his/her activity level and contact with sources of reward, while identifying processes that inhibit activation (see Behavioral Activation for Depression by Katharine Look, Dimidjian, and Herman-Dunn; or assign "Identify and Schedule Pleasant Activities" in the Adult Psychotherapy Homework Planner by [facility]); use behavioral techniques such as instruction, rehearsal, role-playing, role reversal, as needed, to facilitate activity in the client's daily life; reinforce success. Assist the client in developing skills that increase the likelihood of deriving pleasure from behavioral activation (e.g., assertiveness skills, developing an exercise plan, less internal/more external focus, increased social involvement); reinforce success. Objective Learn and implement conflict resolution skills to resolve interpersonal problems. Target Date: 2024-06-01 Frequency: Biweekly  Progress: 70 Modality: individual  Related Interventions Teach conflict resolution skills (e.g., empathy, active listening, "I messages," respectful communication,  assertiveness without aggression, compromise); use psychoeducation, modeling, role-playing, and rehearsal to work through several current conflicts; assign homework exercises; review and repeat so as to  integrate their use into the client's life. Objective Learn and implement relapse prevention skills. Target Date: 2024-06-01 Frequency: Biweekly  Progress: 40 Modality: individual  Related Interventions Identify and rehearse with the client the management of future situations or circumstances in which lapses could occur. Objective Verbalize an understanding of healthy and unhealthy emotions with the intent of increasing the use of healthy emotions to guide actions. Target Date: 2024-06-01 Frequency: Biweekly  Progress: 80 Modality: individual  Related Interventions Use a process-experiential approach consistent with Emotion-Focused Therapy to create a safe, nurturing environment in which the client can process emotions, learning to identify and regulate unhealthy feelings and to generate more adaptive ones that then guide actions (see Emotion-Focused Therapy for Depression by Peter Minium). 3. Demonstrate improved self-esteem through more pride in appearance, more assertiveness, greater eye contact, and identification of positive traits in self-talk messages. 4. Develop an awareness of how childhood issues have affected and continue to affect one's family life. 5. Develop healthy interpersonal relationships that lead to the alleviation and help prevent the relapse of depression. 6. Develop healthy thinking patterns and beliefs about self, others, and the world that lead to the alleviation and help prevent the relapse of depression. 7. Elevate self-esteem. Objective Demonstrate an increased ability to identify and express personal feelings. Target Date: 2024-06-01 Frequency: Biweekly  Progress: 50 Modality: individual  Related Interventions Assist the client in identifying and labeling  emotions. Objective Acknowledge feeling less competent than most others. Target Date: 06-01-2024 Frequency: Biweekly  Progress: 70 Modality: individual  Related Interventions Explore the client's assessment of himself/herself and what is verbalized as the basis for negative self-perception. Actively build the level of trust with the client in individual sessions through consistent eye contact, active listening, unconditional positive regard, and warm acceptance to help increase his/her ability to identify and express feelings. Objective Decrease the frequency of negative self-descriptive statements and increase frequency of positive self-descriptive statements. Target Date: 2024-06-01 Frequency: Biweekly  Progress: 40 Modality: individual  Related Interventions Assist the client in becoming aware of how he/she expresses or acts out negative feelings about himself/herself. Help the client reframe his/her negative assessment of himself/herself. Assist the client in developing positive self-talk as a way of boosting his/her confidence and self-image (or assign "Positive Self-Talk" in the Adult Psychotherapy Homework Planner by Stephannie Li). Objective Identify and engage in activities that would improve self-image by being consistent with one's values. Target Date: 2024-06-01 Frequency: Biweekly  Progress: 60 Modality: individual  Related Interventions Help the client analyze his/her values and the congruence or incongruence between them and the client's daily activities. Identify and assign activities congruent with the client's values; process them toward improving self-concept and self-esteem. Objective Identify positive traits and talents about self. Target Date: 2024-06-01 Frequency: Biweekly  Progress: 40 Modality: individual  Related Interventions Assign the client the exercise of identifying his/her positive physical characteristics in a mirror to help him/her become more comfortable with  himself/herself. Ask the client to keep building a list of positive traits and have him/her read the list at the beginning and end of each session (or assign "Acknowledging My Strengths" or "What Are My Good Qualities?" in the Adult Psychotherapy Homework Planner by [facility]); reinforce the client's positive self-descriptive statements. 8. Enhance ability to effectively cope with the full variety of life's worries and anxieties. 9. Establish an inward sense of self-worth, confidence, and competence. 10. Interact socially without undue distress or disability. 11. Learn and implement coping skills that result in a reduction of anxiety  and worry, and improved daily functioning. 12. Let go of blame and begin to forgive others for pain caused in childhood. Objective Increase level of trust of others as shown by more socialization and greater intimacy tolerance. Target Date: 2024-06-01 Frequency: Biweekly  Progress: 60 Modality: individual  Related Interventions Teach the client the share-check method of building trust in relationships (sharing a little information and checking as to the recipient's sensitivity in reacting to that information). Teach the client the advantages of treating people as trustworthy given a reasonable amount of time to assess their character. Objective Describe each family member and identify the role each played within the family. Target Date: 2024-06-01 Frequency: Biweekly  Progress: 80 Modality: individual  Related Interventions Assist the client in clarifying his/her role within the family and his/her feelings connected to that role. Objective Identify feelings associated with major traumatic incidents in childhood and with parental child-rearing patterns. Target Date: 2024-06-01 Frequency: Biweekly  Progress: 70 Modality: individual  Related Interventions Support and encourage the client when he/she begins to express feelings of rage, sadness, fear, and rejection  relating to family abuse or neglect. Assign the client to record feelings in a journal that describes memories, behavior, and emotions tied to his/her traumatic childhood experiences (or assign "How the Trauma Affects Me" in the Adult Psychotherapy Homework Planner by Stephannie Li). Ask the client to read books on the emotional effects of neglect and abuse in childhood (e.g., It Will Never Happen to Me by [facility]; Outgrowing the Pain by Bronson Curb; Healing the Child Within by [facility]); process insights attained. 13. Reduce overall frequency, intensity, and duration of the anxiety so that daily functioning is not impaired. Objective Reestablish a consistent sleep-wake cycle. Target Date: 2024-06-01 Frequency: Biweekly  Progress: 50 Modality: individual  Related Interventions Teach and implement sleep hygiene practices to help the client reestablish a consistent sleep-wake cycle; review, reinforce success, and provide corrective feedback toward improvement. Objective Identify, challenge, and replace biased, fearful self-talk with positive, realistic, and empowering self-talk. Target Date: 2024-06-01 Frequency: Biweekly  Progress: 40 Modality: individual  Related Interventions Explore the client's schema and self-talk that mediate his/her fear response; assist him/her in challenging the biases; replace the distorted messages with reality-based alternatives and positive, realistic self-talk that will increase his/her self-confidence in coping with irrational fears (see Cognitive Therapy of Anxiety Disorders by Laurence Slate). Assign the client a homework exercise in which he/she identifies fearful self-talk, identifies biases in the self-talk, generates alternatives, and tests through behavioral experiments (or assign "Negative Thoughts Trigger Negative Feelings" in the Adult Psychotherapy Homework Planner by [facility]); review and reinforce success, providing corrective feedback toward improvement. Objective Learn  and implement problem-solving strategies for realistically addressing worries. Target Date: 2023-05-24 Frequency: Biweekly  Progress: 60 Modality: individual  Related Interventions Teach the client problem-solving strategies involving specifically defining a problem, generating options for addressing it, evaluating the pros and cons of each option, selecting and implementing an optional action, and reevaluating and refining the action (or assign "Applying Problem-Solving to Interpersonal Conflict" in the Adult Psychotherapy Homework Planner by Stephannie Li). Objective Learn and implement calming skills to reduce overall anxiety and manage anxiety symptoms. Target Date: 2024-06-01 Frequency: Biweekly  Progress: 70 Modality: individual  Related Interventions Teach the client calming/relaxation skills (e.g., applied relaxation, progressive muscle relaxation, cue controlled relaxation; mindful breathing; biofeedback) and how to discriminate better between relaxation and tension; teach the client how to apply these skills to his/her daily life (e.g., New Directions in Progressive Muscle Relaxation by Marcelyn Ditty,  and Hazlett-Stevens; Treating Generalized Anxiety Disorder by Rygh and Ida Rogue). Assign the client to read about progressive muscle relaxation and other calming strategies in relevant books or treatment manuals (e.g., Progressive Relaxation Training by Robb Matar and Alen Blew; Mastery of Your Anxiety and Worry: Workbook by Earlie Counts). Objective Learn and implement relapse prevention strategies for managing possible future anxiety symptoms. Target Date: 2024-06-01 Frequency: Biweekly  Progress: 40 Modality: individual  Related Interventions Discuss with the client the distinction between a lapse and relapse, associating a lapse with an initial and reversible return of worry, anxiety symptoms, or urges to avoid, and relapse with the decision to continue the fearful and avoidant  patterns. Identify and rehearse with the client the management of future situations or circumstances in which lapses could occur. Instruct the client to routinely use new therapeutic skills (e.g., relaxation, cognitive restructuring, exposure, and problem-solving) in daily life to address emergent worries, anxiety, and avoidant tendencies. Develop a "coping card" on which coping strategies and other important information (e.g., "Breathe deeply and relax," "Challenge unrealistic worries," "Use problem-solving") are written for the client's later use.  Diagnosis:Major depressive disorder, recurrent episode, moderate (HCC)  Generalized anxiety disorder  PTSD (post-traumatic stress disorder)  Plan:  -meet again Friday, July 29, 2023 at 11am in-person

## 2023-07-25 NOTE — Progress Notes (Unsigned)
   Teofilo Pod, [facility]

## 2023-07-29 ENCOUNTER — Encounter: Payer: Self-pay | Admitting: Family

## 2023-07-29 ENCOUNTER — Encounter: Payer: Self-pay | Admitting: Professional

## 2023-07-29 ENCOUNTER — Ambulatory Visit: Admitting: Professional

## 2023-07-29 DIAGNOSIS — F4310 Post-traumatic stress disorder, unspecified: Secondary | ICD-10-CM | POA: Diagnosis not present

## 2023-07-29 DIAGNOSIS — F331 Major depressive disorder, recurrent, moderate: Secondary | ICD-10-CM | POA: Diagnosis not present

## 2023-07-29 DIAGNOSIS — F411 Generalized anxiety disorder: Secondary | ICD-10-CM | POA: Diagnosis not present

## 2023-07-29 MED ORDER — BUPROPION HCL ER (SR) 150 MG PO TB12: ORAL_TABLET | ORAL | 2 refills | Status: DC

## 2023-07-29 NOTE — Progress Notes (Signed)
 [facility] Counselor/Therapist Progress Note  Patient ID: Shannon [name], MRN: [id number],    Date: 07/29/2023  Time Spent: 57 minutes 1103am-12pm   Treatment Type: Individual Therapy  Risk Assessment: Danger to Self:  No Self-injurious Behavior: No Danger to Others: No  Subjective: This session was held via video teletherapy. The patient consented to video teletherapy and was located in her office during this session. She is aware it is the responsibility of the patient to secure confidentiality on her end of the session. The provider was in a private home office for the duration of this session.    The patient arrived on time for the Caregility appointment.   Issues addressed: 1-mood a-improved -missing her dad  2-pt admits she had been upset with therapist but realizes that remarks were appropriate -pt left last session questioning if she would return but sat on it and admitted that my remarks were helpful and stated appropriately -thanked patient for her honesty and reiterated that she can always tell me if my communication is off base 3-marital a-improving b-pt and spouse intimate since our last session and heading in a good direction c-started dance lessons together and had a great time 4-managing conflict with children a-pt aware that Shannon [name] feels her needs don't matter as much as brother Shannon [name]'s b-pt asks Shannon [name] what he wants on "Shannon [name] weekends" and doesn't ask Shannon [name] first -pt fears Shannon [name] will withhold Shannon [name] from him c-how to remove self from sibling issues -she has Shannon [name] can set the boundaries for how they are going to handle d-take control of her space and time and not allow Shannon [name] to control  Treatment Plan Problems Addressed  Anxiety, Childhood Trauma, Low Self-Esteem, Unipolar Depression Goals 1. Alleviate depressive symptoms and return to previous level of effective functioning. 2. Appropriately grieve the loss in order to  normalize mood and to return to previously adaptive level of functioning. Objective Learn and implement behavioral strategies to overcome depression. Target Date: 2024-06-01 Frequency: Biweekly  Progress: 60 Modality: individual  Related Interventions Engage the client in "behavioral activation," increasing his/her activity level and contact with sources of reward, while identifying processes that inhibit activation (see Behavioral Activation for Depression by Katharine Look, Dimidjian, and Herman-Dunn; or assign "Identify and Schedule Pleasant Activities" in the Adult Psychotherapy Homework Planner by [facility]); use behavioral techniques such as instruction, rehearsal, role-playing, role reversal, as needed, to facilitate activity in the client's daily life; reinforce success. Assist the client in developing skills that increase the likelihood of deriving pleasure from behavioral activation (e.g., assertiveness skills, developing an exercise plan, less internal/more external focus, increased social involvement); reinforce success. Objective Learn and implement conflict resolution skills to resolve interpersonal problems. Target Date: 2024-06-01 Frequency: Biweekly  Progress: 70 Modality: individual  Related Interventions Teach conflict resolution skills (e.g., empathy, active listening, "I messages," respectful communication, assertiveness without aggression, compromise); use psychoeducation, modeling, role-playing, and rehearsal to work through several current conflicts; assign homework exercises; review and repeat so as to integrate their use into the client's life. Objective Learn and implement relapse prevention skills. Target Date: 2024-06-01 Frequency: Biweekly  Progress: 40 Modality: individual  Related Interventions Identify and rehearse with the client the management of future situations or circumstances in which lapses could occur. Objective Verbalize an understanding of healthy and unhealthy  emotions with the intent of increasing the use of healthy emotions to guide actions. Target Date: 2024-06-01 Frequency: Biweekly  Progress: 80 Modality: individual  Related Interventions Use a process-experiential approach consistent with Emotion-Focused Therapy to create  a safe, nurturing environment in which the client can process emotions, learning to identify and regulate unhealthy feelings and to generate more adaptive ones that then guide actions (see Emotion-Focused Therapy for Depression by Peter Minium). 3. Demonstrate improved self-esteem through more pride in appearance, more assertiveness, greater eye contact, and identification of positive traits in self-talk messages. 4. Develop an awareness of how childhood issues have affected and continue to affect one's family life. 5. Develop healthy interpersonal relationships that lead to the alleviation and help prevent the relapse of depression. 6. Develop healthy thinking patterns and beliefs about self, others, and the world that lead to the alleviation and help prevent the relapse of depression. 7. Elevate self-esteem. Objective Demonstrate an increased ability to identify and express personal feelings. Target Date: 2024-06-01 Frequency: Biweekly  Progress: 50 Modality: individual  Related Interventions Assist the client in identifying and labeling emotions. Objective Acknowledge feeling less competent than most others. Target Date: 06-01-2024 Frequency: Biweekly  Progress: 70 Modality: individual  Related Interventions Explore the client's assessment of himself/herself and what is verbalized as the basis for negative self-perception. Actively build the level of trust with the client in individual sessions through consistent eye contact, active listening, unconditional positive regard, and warm acceptance to help increase his/her ability to identify and express feelings. Objective Decrease the frequency of negative  self-descriptive statements and increase frequency of positive self-descriptive statements. Target Date: 2024-06-01 Frequency: Biweekly  Progress: 40 Modality: individual  Related Interventions Assist the client in becoming aware of how he/she expresses or acts out negative feelings about himself/herself. Help the client reframe his/her negative assessment of himself/herself. Assist the client in developing positive self-talk as a way of boosting his/her confidence and self-image (or assign "Positive Self-Talk" in the Adult Psychotherapy Homework Planner by Stephannie Li). Objective Identify and engage in activities that would improve self-image by being consistent with one's values. Target Date: 2024-06-01 Frequency: Biweekly  Progress: 60 Modality: individual  Related Interventions Help the client analyze his/her values and the congruence or incongruence between them and the client's daily activities. Identify and assign activities congruent with the client's values; process them toward improving self-concept and self-esteem. Objective Identify positive traits and talents about self. Target Date: 2024-06-01 Frequency: Biweekly  Progress: 40 Modality: individual  Related Interventions Assign the client the exercise of identifying his/her positive physical characteristics in a mirror to help him/her become more comfortable with himself/herself. Ask the client to keep building a list of positive traits and have him/her read the list at the beginning and end of each session (or assign "Acknowledging My Strengths" or "What Are My Good Qualities?" in the Adult Psychotherapy Homework Planner by [facility]); reinforce the client's positive self-descriptive statements. 8. Enhance ability to effectively cope with the full variety of life's worries and anxieties. 9. Establish an inward sense of self-worth, confidence, and competence. 10. Interact socially without undue distress or disability. 11. Learn and  implement coping skills that result in a reduction of anxiety and worry, and improved daily functioning. 12. Let go of blame and begin to forgive others for pain caused in childhood. Objective Increase level of trust of others as shown by more socialization and greater intimacy tolerance. Target Date: 2024-06-01 Frequency: Biweekly  Progress: 60 Modality: individual  Related Interventions Teach the client the share-check method of building trust in relationships (sharing a little information and checking as to the recipient's sensitivity in reacting to that information). Teach the client the advantages of treating people as trustworthy given a reasonable  amount of time to assess their character. Objective Describe each family member and identify the role each played within the family. Target Date: 2024-06-01 Frequency: Biweekly  Progress: 80 Modality: individual  Related Interventions Assist the client in clarifying his/her role within the family and his/her feelings connected to that role. Objective Identify feelings associated with major traumatic incidents in childhood and with parental child-rearing patterns. Target Date: 2024-06-01 Frequency: Biweekly  Progress: 70 Modality: individual  Related Interventions Support and encourage the client when he/she begins to express feelings of rage, sadness, fear, and rejection relating to family abuse or neglect. Assign the client to record feelings in a journal that describes memories, behavior, and emotions tied to his/her traumatic childhood experiences (or assign "How the Trauma Affects Me" in the Adult Psychotherapy Homework Planner by Stephannie Li). Ask the client to read books on the emotional effects of neglect and abuse in childhood (e.g., It Will Never Happen to Me by [facility]; Outgrowing the Pain by Bronson Curb; Healing the Child Within by [facility]); process insights attained. 13. Reduce overall frequency, intensity, and duration of the anxiety so that  daily functioning is not impaired. Objective Reestablish a consistent sleep-wake cycle. Target Date: 2024-06-01 Frequency: Biweekly  Progress: 50 Modality: individual  Related Interventions Teach and implement sleep hygiene practices to help the client reestablish a consistent sleep-wake cycle; review, reinforce success, and provide corrective feedback toward improvement. Objective Identify, challenge, and replace biased, fearful self-talk with positive, realistic, and empowering self-talk. Target Date: 2024-06-01 Frequency: Biweekly  Progress: 40 Modality: individual  Related Interventions Explore the client's schema and self-talk that mediate his/her fear response; assist him/her in challenging the biases; replace the distorted messages with reality-based alternatives and positive, realistic self-talk that will increase his/her self-confidence in coping with irrational fears (see Cognitive Therapy of Anxiety Disorders by Laurence Slate). Assign the client a homework exercise in which he/she identifies fearful self-talk, identifies biases in the self-talk, generates alternatives, and tests through behavioral experiments (or assign "Negative Thoughts Trigger Negative Feelings" in the Adult Psychotherapy Homework Planner by [facility]); review and reinforce success, providing corrective feedback toward improvement. Objective Learn and implement problem-solving strategies for realistically addressing worries. Target Date: 2023-05-24 Frequency: Biweekly  Progress: 60 Modality: individual  Related Interventions Teach the client problem-solving strategies involving specifically defining a problem, generating options for addressing it, evaluating the pros and cons of each option, selecting and implementing an optional action, and reevaluating and refining the action (or assign "Applying Problem-Solving to Interpersonal Conflict" in the Adult Psychotherapy Homework Planner by Stephannie Li). Objective Learn and  implement calming skills to reduce overall anxiety and manage anxiety symptoms. Target Date: 2024-06-01 Frequency: Biweekly  Progress: 70 Modality: individual  Related Interventions Teach the client calming/relaxation skills (e.g., applied relaxation, progressive muscle relaxation, cue controlled relaxation; mindful breathing; biofeedback) and how to discriminate better between relaxation and tension; teach the client how to apply these skills to his/her daily life (e.g., New Directions in Progressive Muscle Relaxation by Marcelyn Ditty, and Hazlett-Stevens; Treating Generalized Anxiety Disorder by Rygh and Ida Rogue). Assign the client to read about progressive muscle relaxation and other calming strategies in relevant books or treatment manuals (e.g., Progressive Relaxation Training by Robb Matar and Alen Blew; Mastery of Your Anxiety and Worry: Workbook by Earlie Counts). Objective Learn and implement relapse prevention strategies for managing possible future anxiety symptoms. Target Date: 2024-06-01 Frequency: Biweekly  Progress: 40 Modality: individual  Related Interventions Discuss with the client the distinction between a lapse and relapse, associating a lapse with an  initial and reversible return of worry, anxiety symptoms, or urges to avoid, and relapse with the decision to continue the fearful and avoidant patterns. Identify and rehearse with the client the management of future situations or circumstances in which lapses could occur. Instruct the client to routinely use new therapeutic skills (e.g., relaxation, cognitive restructuring, exposure, and problem-solving) in daily life to address emergent worries, anxiety, and avoidant tendencies. Develop a "coping card" on which coping strategies and other important information (e.g., "Breathe deeply and relax," "Challenge unrealistic worries," "Use problem-solving") are written for the client's later use.  Diagnosis:Major depressive  disorder, recurrent episode, moderate (HCC)  Generalized anxiety disorder  PTSD (post-traumatic stress disorder)  Plan:  -meet again Thursday, August 18, 2023 at 8am in-person

## 2023-08-04 ENCOUNTER — Ambulatory Visit: Admitting: Professional

## 2023-08-07 ENCOUNTER — Other Ambulatory Visit: Payer: Self-pay | Admitting: Family

## 2023-08-18 ENCOUNTER — Other Ambulatory Visit: Payer: Self-pay | Admitting: Family

## 2023-08-18 ENCOUNTER — Encounter: Payer: Self-pay | Admitting: Professional

## 2023-08-18 ENCOUNTER — Encounter: Payer: Self-pay | Admitting: Family

## 2023-08-18 ENCOUNTER — Ambulatory Visit: Admitting: Professional

## 2023-08-18 ENCOUNTER — Telehealth: Payer: Self-pay

## 2023-08-18 DIAGNOSIS — F4310 Post-traumatic stress disorder, unspecified: Secondary | ICD-10-CM

## 2023-08-18 DIAGNOSIS — F411 Generalized anxiety disorder: Secondary | ICD-10-CM

## 2023-08-18 DIAGNOSIS — F331 Major depressive disorder, recurrent, moderate: Secondary | ICD-10-CM | POA: Diagnosis not present

## 2023-08-18 MED ORDER — KLONOPIN 0.5 MG PO TABS
0.5000 mg | ORAL_TABLET | Freq: Three times a day (TID) | ORAL | 0 refills | Status: DC | PRN
Start: 2023-08-18 — End: 2023-09-16

## 2023-08-18 MED ORDER — CLONAZEPAM 0.5 MG PO TABS: 0.5000 mg | ORAL_TABLET | Freq: Two times a day (BID) | ORAL | 0 refills | Status: DC | PRN

## 2023-08-18 NOTE — Telephone Encounter (Signed)
 Resent klonopin brand name DAW and advised pt. Generic bid rx was cancelled at [facility].

## 2023-08-18 NOTE — Telephone Encounter (Signed)
 Requesting: clonazepam 0.5mg   Contract: 05/30/23 UDS: 05/30/23 Last Visit: 05/30/23 Next Visit: None Last Refill: 07/14/23 #90 and 0RF   Please Advise

## 2023-08-18 NOTE — Progress Notes (Signed)
 [facility] Counselor/Therapist Progress Note  Patient ID: Shannon [name], MRN: [id number],    Date: 08/18/2023  Time Spent: 56 minutes [id number]  Treatment Type: Individual Therapy  Risk Assessment: Danger to Self:  No Self-injurious Behavior: No Danger to Others: No  Subjective: This session was held via video teletherapy. The patient consented to video teletherapy and was located in her office during this session. She is aware it is the responsibility of the patient to secure confidentiality on her end of the session. The provider was in a private home office for the duration of this session.    The patient arrived on time for the Caregility appointment.   Issues addressed: 1-marital a-ongoing issue with Ernestina Headland -pt recognizes her lack of attention to her spouse has impacted their marriage -working to rebuild trust -planning to be more concerned with herself and her marriage -spouse wants her to stop being in the middle of things with their children and pt is ready to step back 4-managing conflict with children a-pt feels spent in dealing with issues related to children -she acknowledges her mistakes and admits she is tired -pt willing to step back and let the kids deal with their own relationships -pt realizes she needs to stop allowing Zoila Hines to control her with access to Rorie -pt plans to start by sending out an invite to all the children at the same time inviting them and they can decided if they will participate or not  Treatment Plan Problems Addressed  Anxiety, Childhood Trauma, Low Self-Esteem, Unipolar Depression Goals 1. Alleviate depressive symptoms and return to previous level of effective functioning. 2. Appropriately grieve the loss in order to normalize mood and to return to previously adaptive level of functioning. Objective Learn and implement behavioral strategies to overcome depression. Target Date: 2024-06-01 Frequency: Biweekly   Progress: 60 Modality: individual  Related Interventions Engage the client in "behavioral activation," increasing his/her activity level and contact with sources of reward, while identifying processes that inhibit activation (see Behavioral Activation for Depression by Lynette Saras, Dimidjian, and Herman-Dunn; or assign "Identify and Schedule Pleasant Activities" in the Adult Psychotherapy Homework Planner by [facility]); use behavioral techniques such as instruction, rehearsal, role-playing, role reversal, as needed, to facilitate activity in the client's daily life; reinforce success. Assist the client in developing skills that increase the likelihood of deriving pleasure from behavioral activation (e.g., assertiveness skills, developing an exercise plan, less internal/more external focus, increased social involvement); reinforce success. Objective Learn and implement conflict resolution skills to resolve interpersonal problems. Target Date: 2024-06-01 Frequency: Biweekly  Progress: 70 Modality: individual  Related Interventions Teach conflict resolution skills (e.g., empathy, active listening, "I messages," respectful communication, assertiveness without aggression, compromise); use psychoeducation, modeling, role-playing, and rehearsal to work through several current conflicts; assign homework exercises; review and repeat so as to integrate their use into the client's life. Objective Learn and implement relapse prevention skills. Target Date: 2024-06-01 Frequency: Biweekly  Progress: 40 Modality: individual  Related Interventions Identify and rehearse with the client the management of future situations or circumstances in which lapses could occur. Objective Verbalize an understanding of healthy and unhealthy emotions with the intent of increasing the use of healthy emotions to guide actions. Target Date: 2024-06-01 Frequency: Biweekly  Progress: 80 Modality: individual  Related Interventions Use a  process-experiential approach consistent with Emotion-Focused Therapy to create a safe, nurturing environment in which the client can process emotions, learning to identify and regulate unhealthy feelings and to generate more adaptive ones that then guide actions (  see Emotion-Focused Therapy for Depression by Moreen Apt). 3. Demonstrate improved self-esteem through more pride in appearance, more assertiveness, greater eye contact, and identification of positive traits in self-talk messages. 4. Develop an awareness of how childhood issues have affected and continue to affect one's family life. 5. Develop healthy interpersonal relationships that lead to the alleviation and help prevent the relapse of depression. 6. Develop healthy thinking patterns and beliefs about self, others, and the world that lead to the alleviation and help prevent the relapse of depression. 7. Elevate self-esteem. Objective Demonstrate an increased ability to identify and express personal feelings. Target Date: 2024-06-01 Frequency: Biweekly  Progress: 50 Modality: individual  Related Interventions Assist the client in identifying and labeling emotions. Objective Acknowledge feeling less competent than most others. Target Date: 06-01-2024 Frequency: Biweekly  Progress: 70 Modality: individual  Related Interventions Explore the client's assessment of himself/herself and what is verbalized as the basis for negative self-perception. Actively build the level of trust with the client in individual sessions through consistent eye contact, active listening, unconditional positive regard, and warm acceptance to help increase his/her ability to identify and express feelings. Objective Decrease the frequency of negative self-descriptive statements and increase frequency of positive self-descriptive statements. Target Date: 2024-06-01 Frequency: Biweekly  Progress: 40 Modality: individual  Related Interventions Assist  the client in becoming aware of how he/she expresses or acts out negative feelings about himself/herself. Help the client reframe his/her negative assessment of himself/herself. Assist the client in developing positive self-talk as a way of boosting his/her confidence and self-image (or assign "Positive Self-Talk" in the Adult Psychotherapy Homework Planner by Beacher Bottoms). Objective Identify and engage in activities that would improve self-image by being consistent with one's values. Target Date: 2024-06-01 Frequency: Biweekly  Progress: 60 Modality: individual  Related Interventions Help the client analyze his/her values and the congruence or incongruence between them and the client's daily activities. Identify and assign activities congruent with the client's values; process them toward improving self-concept and self-esteem. Objective Identify positive traits and talents about self. Target Date: 2024-06-01 Frequency: Biweekly  Progress: 40 Modality: individual  Related Interventions Assign the client the exercise of identifying his/her positive physical characteristics in a mirror to help him/her become more comfortable with himself/herself. Ask the client to keep building a list of positive traits and have him/her read the list at the beginning and end of each session (or assign "Acknowledging My Strengths" or "What Are My Good Qualities?" in the Adult Psychotherapy Homework Planner by [facility]); reinforce the client's positive self-descriptive statements. 8. Enhance ability to effectively cope with the full variety of life's worries and anxieties. 9. Establish an inward sense of self-worth, confidence, and competence. 10. Interact socially without undue distress or disability. 11. Learn and implement coping skills that result in a reduction of anxiety and worry, and improved daily functioning. 12. Let go of blame and begin to forgive others for pain caused in childhood. Objective Increase  level of trust of others as shown by more socialization and greater intimacy tolerance. Target Date: 2024-06-01 Frequency: Biweekly  Progress: 60 Modality: individual  Related Interventions Teach the client the share-check method of building trust in relationships (sharing a little information and checking as to the recipient's sensitivity in reacting to that information). Teach the client the advantages of treating people as trustworthy given a reasonable amount of time to assess their character. Objective Describe each family member and identify the role each played within the family. Target Date: 2024-06-01 Frequency: Biweekly  Progress:  80 Modality: individual  Related Interventions Assist the client in clarifying his/her role within the family and his/her feelings connected to that role. Objective Identify feelings associated with major traumatic incidents in childhood and with parental child-rearing patterns. Target Date: 2024-06-01 Frequency: Biweekly  Progress: 70 Modality: individual  Related Interventions Support and encourage the client when he/she begins to express feelings of rage, sadness, fear, and rejection relating to family abuse or neglect. Assign the client to record feelings in a journal that describes memories, behavior, and emotions tied to his/her traumatic childhood experiences (or assign "How the Trauma Affects Me" in the Adult Psychotherapy Homework Planner by Beacher Bottoms). Ask the client to read books on the emotional effects of neglect and abuse in childhood (e.g., It Will Never Happen to Me by [facility]; Outgrowing the Pain by Dwyane Glad; Healing the Child Within by [facility]); process insights attained. 13. Reduce overall frequency, intensity, and duration of the anxiety so that daily functioning is not impaired. Objective Reestablish a consistent sleep-wake cycle. Target Date: 2024-06-01 Frequency: Biweekly  Progress: 50 Modality: individual  Related Interventions Teach and  implement sleep hygiene practices to help the client reestablish a consistent sleep-wake cycle; review, reinforce success, and provide corrective feedback toward improvement. Objective Identify, challenge, and replace biased, fearful self-talk with positive, realistic, and empowering self-talk. Target Date: 2024-06-01 Frequency: Biweekly  Progress: 40 Modality: individual  Related Interventions Explore the client's schema and self-talk that mediate his/her fear response; assist him/her in challenging the biases; replace the distorted messages with reality-based alternatives and positive, realistic self-talk that will increase his/her self-confidence in coping with irrational fears (see Cognitive Therapy of Anxiety Disorders by Anderson Kaufman). Assign the client a homework exercise in which he/she identifies fearful self-talk, identifies biases in the self-talk, generates alternatives, and tests through behavioral experiments (or assign "Negative Thoughts Trigger Negative Feelings" in the Adult Psychotherapy Homework Planner by [facility]); review and reinforce success, providing corrective feedback toward improvement. Objective Learn and implement problem-solving strategies for realistically addressing worries. Target Date: 2024-06-01 Frequency: Biweekly  Progress: 60 Modality: individual  Related Interventions Teach the client problem-solving strategies involving specifically defining a problem, generating options for addressing it, evaluating the pros and cons of each option, selecting and implementing an optional action, and reevaluating and refining the action (or assign "Applying Problem-Solving to Interpersonal Conflict" in the Adult Psychotherapy Homework Planner by Beacher Bottoms). Objective Learn and implement calming skills to reduce overall anxiety and manage anxiety symptoms. Target Date: 2024-06-01 Frequency: Biweekly  Progress: 70 Modality: individual  Related Interventions Teach the client  calming/relaxation skills (e.g., applied relaxation, progressive muscle relaxation, cue controlled relaxation; mindful breathing; biofeedback) and how to discriminate better between relaxation and tension; teach the client how to apply these skills to his/her daily life (e.g., New Directions in Progressive Muscle Relaxation by Fara Hone, and Hazlett-Stevens; Treating Generalized Anxiety Disorder by Rygh and Joya Nissen). Assign the client to read about progressive muscle relaxation and other calming strategies in relevant books or treatment manuals (e.g., Progressive Relaxation Training by Rodolfo Clan and Arvil Birks; Mastery of Your Anxiety and Worry: Workbook by Rodney Clamp). Objective Learn and implement relapse prevention strategies for managing possible future anxiety symptoms. Target Date: 2024-06-01 Frequency: Biweekly  Progress: 40 Modality: individual  Related Interventions Discuss with the client the distinction between a lapse and relapse, associating a lapse with an initial and reversible return of worry, anxiety symptoms, or urges to avoid, and relapse with the decision to continue the fearful and avoidant patterns. Identify and rehearse with  the client the management of future situations or circumstances in which lapses could occur. Instruct the client to routinely use new therapeutic skills (e.g., relaxation, cognitive restructuring, exposure, and problem-solving) in daily life to address emergent worries, anxiety, and avoidant tendencies. Develop a "coping card" on which coping strategies and other important information (e.g., "Breathe deeply and relax," "Challenge unrealistic worries," "Use problem-solving") are written for the client's later use.  Diagnosis:Major depressive disorder, recurrent episode, moderate (HCC)  Generalized anxiety disorder  PTSD (post-traumatic stress disorder)  Plan:  -meet again Thursday, Sep 15, 2023 at 8am in-person

## 2023-08-18 NOTE — Telephone Encounter (Signed)
 Copied from CRM [id number]. Topic: Clinical - Prescription Issue >> Aug 18, 2023  2:35 PM Taleah C wrote: Reason for CRM: patient called and stated there is an issue with her prescription for clonazepam. She explained that the prescription needs to be written as DAW1 or else her insurance won't cover it. She also mentioned that there was a decrease of the dose to 2x/ day. Patient is unsure why because it's always been 3x/ day. Please call and advise.

## 2023-08-25 DIAGNOSIS — M25552 Pain in left hip: Secondary | ICD-10-CM | POA: Diagnosis not present

## 2023-08-25 DIAGNOSIS — M25551 Pain in right hip: Secondary | ICD-10-CM | POA: Diagnosis not present

## 2023-09-09 DIAGNOSIS — M25551 Pain in right hip: Secondary | ICD-10-CM | POA: Diagnosis not present

## 2023-09-14 DIAGNOSIS — R1084 Generalized abdominal pain: Secondary | ICD-10-CM | POA: Diagnosis not present

## 2023-09-14 DIAGNOSIS — K5900 Constipation, unspecified: Secondary | ICD-10-CM | POA: Diagnosis not present

## 2023-09-14 DIAGNOSIS — K5909 Other constipation: Secondary | ICD-10-CM | POA: Diagnosis not present

## 2023-09-15 ENCOUNTER — Ambulatory Visit (INDEPENDENT_AMBULATORY_CARE_PROVIDER_SITE_OTHER): Admitting: Professional

## 2023-09-15 ENCOUNTER — Encounter: Payer: Self-pay | Admitting: Professional

## 2023-09-15 DIAGNOSIS — F331 Major depressive disorder, recurrent, moderate: Secondary | ICD-10-CM | POA: Diagnosis not present

## 2023-09-15 DIAGNOSIS — F4310 Post-traumatic stress disorder, unspecified: Secondary | ICD-10-CM | POA: Diagnosis not present

## 2023-09-15 DIAGNOSIS — F411 Generalized anxiety disorder: Secondary | ICD-10-CM | POA: Diagnosis not present

## 2023-09-15 NOTE — Progress Notes (Signed)
 [facility] Counselor/Therapist Progress Note  Patient ID: Shannon [name], MRN: [id number],    Date: 09/15/2023  Time Spent: 40 minutes [id number]  Treatment Type: Individual Therapy  Risk Assessment: Danger to Self:  No Self-injurious Behavior: No Danger to Others: No  Subjective: This session was held via video teletherapy. The patient consented to video teletherapy and was located in her office during this session. She is aware it is the responsibility of the patient to secure confidentiality on her end of the session. The provider was in a private home office for the duration of this session.    The patient arrived on time for the Caregility appointment.   Issues addressed: 1-trip to [place] a-was more difficult than she expected -Shannon [name], her parent's dog, was at the airport when Shannon [name] picked her up -Shannon [name] was looking for her mom and dad since pt always is with them b-she was there for her Aunt Shannon [name]'s 90th birthday party -Shannon [name] and she left [place] for [place] -they visited Shannon [name] at her home the night before and stayed and chatted in to the evening -the party on Saturday was wonderful -they saw her on Sunday before they were to return to [place] -Shannon [name] cried and this was very uncharacteristic -pt asked her daughters to stay with her and let them know how she was c-back in [place] they met her dad's work buddies and she felt a lot of emotion -her dad's closest person was Shannon [name] and he gave her a big hug -she learned more about her father while parents were divorced -he had dated a lady with twin boys and pt shared her feelings with her father -he did not see her after that -the coworkers brought up a conference he ran in [place] where this lady answered his hotel room phone d-it rained the entire week she was there -the trip did not go as well and she cried often -she struggled seeing her aunt so emotional and fearful they would never see her again -she was upset that the men talked about  "the other woman" -she departed on an earlier flight since Shannon [name] was going to be at appointments 2-before she left for her trip she went to see Shannon [name] a-she was going to stay until she opened the door -she texted her from the porch and five minutes later the door opened -she laughter and smiled and asked her why she was there -pt disclosed why she was there b-they had lunch and it was nice -pt offered to have her come to our session -she ws feeling that everyone got her before the pt especially Shannon [name] and Shannon [name] -there was a particular comment the pt made that stuck with her daughter -she feels last on the list in order of importance but it's okay because she has adjusted c-pt acknowledged pt's feelings and apologized -daughter was thankful for the effort her mother put forth -Shannon [name] realized she was raised by a boy mom and not a girl mom -pt admits it felt like a punch in the gut but she's right -it goes back to the way her mother treated her brother -pt pushed her daughter's relationship with Shannon [name] d-discussed Shannon [name] wanting to demonstrate that she cares about her  Treatment Plan Problems Addressed  Anxiety, Childhood Trauma, Low Self-Esteem, Unipolar Depression Goals 1. Alleviate depressive symptoms and return to previous level of effective functioning. 2. Appropriately grieve the loss in order to normalize mood and to return to previously adaptive level of functioning. Objective Learn and implement behavioral strategies  to overcome depression. Target Date: 2024-06-01 Frequency: Biweekly  Progress: 60 Modality: individual  Related Interventions Engage the client in "behavioral activation," increasing his/her activity level and contact with sources of reward, while identifying processes that inhibit activation (see Behavioral Activation for Depression by Lynette Saras, Dimidjian, and Herman-Dunn; or assign "Identify and Schedule Pleasant Activities" in the Adult Psychotherapy Homework  Planner by [facility]); use behavioral techniques such as instruction, rehearsal, role-playing, role reversal, as needed, to facilitate activity in the client's daily life; reinforce success. Assist the client in developing skills that increase the likelihood of deriving pleasure from behavioral activation (e.g., assertiveness skills, developing an exercise plan, less internal/more external focus, increased social involvement); reinforce success. Objective Learn and implement conflict resolution skills to resolve interpersonal problems. Target Date: 2024-06-01 Frequency: Biweekly  Progress: 70 Modality: individual  Related Interventions Teach conflict resolution skills (e.g., empathy, active listening, "I messages," respectful communication, assertiveness without aggression, compromise); use psychoeducation, modeling, role-playing, and rehearsal to work through several current conflicts; assign homework exercises; review and repeat so as to integrate their use into the client's life. Objective Learn and implement relapse prevention skills. Target Date: 2024-06-01 Frequency: Biweekly  Progress: 40 Modality: individual  Related Interventions Identify and rehearse with the client the management of future situations or circumstances in which lapses could occur. Objective Verbalize an understanding of healthy and unhealthy emotions with the intent of increasing the use of healthy emotions to guide actions. Target Date: 2024-06-01 Frequency: Biweekly  Progress: 80 Modality: individual  Related Interventions Use a process-experiential approach consistent with Emotion-Focused Therapy to create a safe, nurturing environment in which the client can process emotions, learning to identify and regulate unhealthy feelings and to generate more adaptive ones that then guide actions (see Emotion-Focused Therapy for Depression by Moreen Apt). 3. Demonstrate improved self-esteem through more pride in  appearance, more assertiveness, greater eye contact, and identification of positive traits in self-talk messages. 4. Develop an awareness of how childhood issues have affected and continue to affect one's family life. 5. Develop healthy interpersonal relationships that lead to the alleviation and help prevent the relapse of depression. 6. Develop healthy thinking patterns and beliefs about self, others, and the world that lead to the alleviation and help prevent the relapse of depression. 7. Elevate self-esteem. Objective Demonstrate an increased ability to identify and express personal feelings. Target Date: 2024-06-01 Frequency: Biweekly  Progress: 50 Modality: individual  Related Interventions Assist the client in identifying and labeling emotions. Objective Acknowledge feeling less competent than most others. Target Date: 06-01-2024 Frequency: Biweekly  Progress: 70 Modality: individual  Related Interventions Explore the client's assessment of himself/herself and what is verbalized as the basis for negative self-perception. Actively build the level of trust with the client in individual sessions through consistent eye contact, active listening, unconditional positive regard, and warm acceptance to help increase his/her ability to identify and express feelings. Objective Decrease the frequency of negative self-descriptive statements and increase frequency of positive self-descriptive statements. Target Date: 2024-06-01 Frequency: Biweekly  Progress: 40 Modality: individual  Related Interventions Assist the client in becoming aware of how he/she expresses or acts out negative feelings about himself/herself. Help the client reframe his/her negative assessment of himself/herself. Assist the client in developing positive self-talk as a way of boosting his/her confidence and self-image (or assign "Positive Self-Talk" in the Adult Psychotherapy Homework Planner by Beacher Bottoms). Objective Identify  and engage in activities that would improve self-image by being consistent with one's values. Target Date: 2024-06-01 Frequency: Biweekly  Progress:  60 Modality: individual  Related Interventions Help the client analyze his/her values and the congruence or incongruence between them and the client's daily activities. Identify and assign activities congruent with the client's values; process them toward improving self-concept and self-esteem. Objective Identify positive traits and talents about self. Target Date: 2024-06-01 Frequency: Biweekly  Progress: 40 Modality: individual  Related Interventions Assign the client the exercise of identifying his/her positive physical characteristics in a mirror to help him/her become more comfortable with himself/herself. Ask the client to keep building a list of positive traits and have him/her read the list at the beginning and end of each session (or assign "Acknowledging My Strengths" or "What Are My Good Qualities?" in the Adult Psychotherapy Homework Planner by [facility]); reinforce the client's positive self-descriptive statements. 8. Enhance ability to effectively cope with the full variety of life's worries and anxieties. 9. Establish an inward sense of self-worth, confidence, and competence. 10. Interact socially without undue distress or disability. 11. Learn and implement coping skills that result in a reduction of anxiety and worry, and improved daily functioning. 12. Let go of blame and begin to forgive others for pain caused in childhood. Objective Increase level of trust of others as shown by more socialization and greater intimacy tolerance. Target Date: 2024-06-01 Frequency: Biweekly  Progress: 60 Modality: individual  Related Interventions Teach the client the share-check method of building trust in relationships (sharing a little information and checking as to the recipient's sensitivity in reacting to that information). Teach the client  the advantages of treating people as trustworthy given a reasonable amount of time to assess their character. Objective Describe each family member and identify the role each played within the family. Target Date: 2024-06-01 Frequency: Biweekly  Progress: 80 Modality: individual  Related Interventions Assist the client in clarifying his/her role within the family and his/her feelings connected to that role. Objective Identify feelings associated with major traumatic incidents in childhood and with parental child-rearing patterns. Target Date: 2024-06-01 Frequency: Biweekly  Progress: 70 Modality: individual  Related Interventions Support and encourage the client when he/she begins to express feelings of rage, sadness, fear, and rejection relating to family abuse or neglect. Assign the client to record feelings in a journal that describes memories, behavior, and emotions tied to his/her traumatic childhood experiences (or assign "How the Trauma Affects Me" in the Adult Psychotherapy Homework Planner by Beacher Bottoms). Ask the client to read books on the emotional effects of neglect and abuse in childhood (e.g., It Will Never Happen to Me by [facility]; Outgrowing the Pain by Dwyane Glad; Healing the Child Within by [facility]); process insights attained. 13. Reduce overall frequency, intensity, and duration of the anxiety so that daily functioning is not impaired. Objective Reestablish a consistent sleep-wake cycle. Target Date: 2024-06-01 Frequency: Biweekly  Progress: 50 Modality: individual  Related Interventions Teach and implement sleep hygiene practices to help the client reestablish a consistent sleep-wake cycle; review, reinforce success, and provide corrective feedback toward improvement. Objective Identify, challenge, and replace biased, fearful self-talk with positive, realistic, and empowering self-talk. Target Date: 2024-06-01 Frequency: Biweekly  Progress: 40 Modality: individual  Related  Interventions Explore the client's schema and self-talk that mediate his/her fear response; assist him/her in challenging the biases; replace the distorted messages with reality-based alternatives and positive, realistic self-talk that will increase his/her self-confidence in coping with irrational fears (see Cognitive Therapy of Anxiety Disorders by Anderson Kaufman). Assign the client a homework exercise in which he/she identifies fearful self-talk, identifies biases in the self-talk,  generates alternatives, and tests through behavioral experiments (or assign "Negative Thoughts Trigger Negative Feelings" in the Adult Psychotherapy Homework Planner by [facility]); review and reinforce success, providing corrective feedback toward improvement. Objective Learn and implement problem-solving strategies for realistically addressing worries. Target Date: 2024-06-01 Frequency: Biweekly  Progress: 60 Modality: individual  Related Interventions Teach the client problem-solving strategies involving specifically defining a problem, generating options for addressing it, evaluating the pros and cons of each option, selecting and implementing an optional action, and reevaluating and refining the action (or assign "Applying Problem-Solving to Interpersonal Conflict" in the Adult Psychotherapy Homework Planner by Beacher Bottoms). Objective Learn and implement calming skills to reduce overall anxiety and manage anxiety symptoms. Target Date: 2024-06-01 Frequency: Biweekly  Progress: 70 Modality: individual  Related Interventions Teach the client calming/relaxation skills (e.g., applied relaxation, progressive muscle relaxation, cue controlled relaxation; mindful breathing; biofeedback) and how to discriminate better between relaxation and tension; teach the client how to apply these skills to his/her daily life (e.g., New Directions in Progressive Muscle Relaxation by Fara Hone, and Hazlett-Stevens; Treating Generalized  Anxiety Disorder by Rygh and Joya Nissen). Assign the client to read about progressive muscle relaxation and other calming strategies in relevant books or treatment manuals (e.g., Progressive Relaxation Training by Rodolfo Clan and Arvil Birks; Mastery of Your Anxiety and Worry: Workbook by Rodney Clamp). Objective Learn and implement relapse prevention strategies for managing possible future anxiety symptoms. Target Date: 2024-06-01 Frequency: Biweekly  Progress: 40 Modality: individual  Related Interventions Discuss with the client the distinction between a lapse and relapse, associating a lapse with an initial and reversible return of worry, anxiety symptoms, or urges to avoid, and relapse with the decision to continue the fearful and avoidant patterns. Identify and rehearse with the client the management of future situations or circumstances in which lapses could occur. Instruct the client to routinely use new therapeutic skills (e.g., relaxation, cognitive restructuring, exposure, and problem-solving) in daily life to address emergent worries, anxiety, and avoidant tendencies. Develop a "coping card" on which coping strategies and other important information (e.g., "Breathe deeply and relax," "Challenge unrealistic worries," "Use problem-solving") are written for the client's later use.  Diagnosis:Major depressive disorder, recurrent episode, moderate (HCC)  Generalized anxiety disorder  PTSD (post-traumatic stress disorder)  Plan:  -meet again Thursday, Sep 22, 2023 at 8am in-person

## 2023-09-16 ENCOUNTER — Encounter: Payer: Self-pay | Admitting: Family

## 2023-09-16 ENCOUNTER — Other Ambulatory Visit: Payer: Self-pay | Admitting: Family

## 2023-09-16 DIAGNOSIS — F419 Anxiety disorder, unspecified: Secondary | ICD-10-CM

## 2023-09-18 MED ORDER — BUPROPION HCL ER (XL) 150 MG PO TB24: 150.0000 mg | ORAL_TABLET | Freq: Every day | ORAL | 0 refills | Status: DC

## 2023-09-18 NOTE — Addendum Note (Signed)
 Addended by: Dorrene Gaucher on: 09/18/2023 09:29 PM   Modules accepted: Orders

## 2023-09-19 DIAGNOSIS — M25551 Pain in right hip: Secondary | ICD-10-CM | POA: Diagnosis not present

## 2023-09-19 DIAGNOSIS — M25552 Pain in left hip: Secondary | ICD-10-CM | POA: Diagnosis not present

## 2023-09-22 ENCOUNTER — Ambulatory Visit: Admitting: Professional

## 2023-09-22 ENCOUNTER — Encounter: Payer: Self-pay | Admitting: Professional

## 2023-09-22 DIAGNOSIS — F331 Major depressive disorder, recurrent, moderate: Secondary | ICD-10-CM

## 2023-09-22 DIAGNOSIS — F4310 Post-traumatic stress disorder, unspecified: Secondary | ICD-10-CM

## 2023-09-22 DIAGNOSIS — F411 Generalized anxiety disorder: Secondary | ICD-10-CM

## 2023-09-22 NOTE — Progress Notes (Signed)
 [facility] Counselor/Therapist Progress Note  Patient ID: Shannon [name], MRN: [id number],    Date: 09/22/2023  Time Spent: 63 minutes [id number]  Treatment Type: Individual Therapy  Risk Assessment: Danger to Self:  No Self-injurious Behavior: No Danger to Others: No  Subjective: The patient arrived on time for her in person appointment.   Issues addressed: 1-Shannon [name] -pt spend two nights with her daughter and had a wonderful time -she thinks it is helpful for them to move in a positive direction 2-Shannon [name] -pt was surprised when Zoila Hines mentioned having his sister and her bf come to the beach while he and Rorie were there -pt reports he has not mentioned since but if he does pt is going to suggest he call his sister and invite her 3-marital -pt and Shannon [name] are struggling -he is not actively participating with family and is sleeping a lot -Clinician suggested possible depression and reviewed symptoms and pt reports he displays 2/3 of the symptoms -pt admits wants things to be different and that she absolutely loves Shannon [name] -they started to spend more time together but it was short-lived due to family circumstances -discussed importance of making time for Shannon [name] and that the children/grandchildren become secondary -suggested planning some individual time with him and engaging in things they both enjoy  Treatment Plan Problems Addressed  Anxiety, Childhood Trauma, Low Self-Esteem, Unipolar Depression Goals 1. Alleviate depressive symptoms and return to previous level of effective functioning. 2. Appropriately grieve the loss in order to normalize mood and to return to previously adaptive level of functioning. Objective Learn and implement behavioral strategies to overcome depression. Target Date: 2024-06-01 Frequency: Biweekly  Progress: 60 Modality: individual  Related Interventions Engage the client in "behavioral activation," increasing his/her activity level  and contact with sources of reward, while identifying processes that inhibit activation (see Behavioral Activation for Depression by Lynette Saras, Dimidjian, and Herman-Dunn; or assign "Identify and Schedule Pleasant Activities" in the Adult Psychotherapy Homework Planner by [facility]); use behavioral techniques such as instruction, rehearsal, role-playing, role reversal, as needed, to facilitate activity in the client's daily life; reinforce success. Assist the client in developing skills that increase the likelihood of deriving pleasure from behavioral activation (e.g., assertiveness skills, developing an exercise plan, less internal/more external focus, increased social involvement); reinforce success. Objective Learn and implement conflict resolution skills to resolve interpersonal problems. Target Date: 2024-06-01 Frequency: Biweekly  Progress: 70 Modality: individual  Related Interventions Teach conflict resolution skills (e.g., empathy, active listening, "I messages," respectful communication, assertiveness without aggression, compromise); use psychoeducation, modeling, role-playing, and rehearsal to work through several current conflicts; assign homework exercises; review and repeat so as to integrate their use into the client's life. Objective Learn and implement relapse prevention skills. Target Date: 2024-06-01 Frequency: Biweekly  Progress: 40 Modality: individual  Related Interventions Identify and rehearse with the client the management of future situations or circumstances in which lapses could occur. Objective Verbalize an understanding of healthy and unhealthy emotions with the intent of increasing the use of healthy emotions to guide actions. Target Date: 2024-06-01 Frequency: Biweekly  Progress: 80 Modality: individual  Related Interventions Use a process-experiential approach consistent with Emotion-Focused Therapy to create a safe, nurturing environment in which the client can  process emotions, learning to identify and regulate unhealthy feelings and to generate more adaptive ones that then guide actions (see Emotion-Focused Therapy for Depression by Moreen Apt). 3. Demonstrate improved self-esteem through more pride in appearance, more assertiveness, greater eye contact, and identification of positive traits in self-talk  messages. 4. Develop an awareness of how childhood issues have affected and continue to affect one's family life. 5. Develop healthy interpersonal relationships that lead to the alleviation and help prevent the relapse of depression. 6. Develop healthy thinking patterns and beliefs about self, others, and the world that lead to the alleviation and help prevent the relapse of depression. 7. Elevate self-esteem. Objective Demonstrate an increased ability to identify and express personal feelings. Target Date: 2024-06-01 Frequency: Biweekly  Progress: 50 Modality: individual  Related Interventions Assist the client in identifying and labeling emotions. Objective Acknowledge feeling less competent than most others. Target Date: 06-01-2024 Frequency: Biweekly  Progress: 70 Modality: individual  Related Interventions Explore the client's assessment of himself/herself and what is verbalized as the basis for negative self-perception. Actively build the level of trust with the client in individual sessions through consistent eye contact, active listening, unconditional positive regard, and warm acceptance to help increase his/her ability to identify and express feelings. Objective Decrease the frequency of negative self-descriptive statements and increase frequency of positive self-descriptive statements. Target Date: 2024-06-01 Frequency: Biweekly  Progress: 40 Modality: individual  Related Interventions Assist the client in becoming aware of how he/she expresses or acts out negative feelings about himself/herself. Help the client reframe  his/her negative assessment of himself/herself. Assist the client in developing positive self-talk as a way of boosting his/her confidence and self-image (or assign "Positive Self-Talk" in the Adult Psychotherapy Homework Planner by Beacher Bottoms). Objective Identify and engage in activities that would improve self-image by being consistent with one's values. Target Date: 2024-06-01 Frequency: Biweekly  Progress: 60 Modality: individual  Related Interventions Help the client analyze his/her values and the congruence or incongruence between them and the client's daily activities. Identify and assign activities congruent with the client's values; process them toward improving self-concept and self-esteem. Objective Identify positive traits and talents about self. Target Date: 2024-06-01 Frequency: Biweekly  Progress: 40 Modality: individual  Related Interventions Assign the client the exercise of identifying his/her positive physical characteristics in a mirror to help him/her become more comfortable with himself/herself. Ask the client to keep building a list of positive traits and have him/her read the list at the beginning and end of each session (or assign "Acknowledging My Strengths" or "What Are My Good Qualities?" in the Adult Psychotherapy Homework Planner by [facility]); reinforce the client's positive self-descriptive statements. 8. Enhance ability to effectively cope with the full variety of life's worries and anxieties. 9. Establish an inward sense of self-worth, confidence, and competence. 10. Interact socially without undue distress or disability. 11. Learn and implement coping skills that result in a reduction of anxiety and worry, and improved daily functioning. 12. Let go of blame and begin to forgive others for pain caused in childhood. Objective Increase level of trust of others as shown by more socialization and greater intimacy tolerance. Target Date: 2024-06-01 Frequency: Biweekly   Progress: 60 Modality: individual  Related Interventions Teach the client the share-check method of building trust in relationships (sharing a little information and checking as to the recipient's sensitivity in reacting to that information). Teach the client the advantages of treating people as trustworthy given a reasonable amount of time to assess their character. Objective Describe each family member and identify the role each played within the family. Target Date: 2024-06-01 Frequency: Biweekly  Progress: 80 Modality: individual  Related Interventions Assist the client in clarifying his/her role within the family and his/her feelings connected to that role. Objective Identify feelings associated with major traumatic  incidents in childhood and with parental child-rearing patterns. Target Date: 2024-06-01 Frequency: Biweekly  Progress: 70 Modality: individual  Related Interventions Support and encourage the client when he/she begins to express feelings of rage, sadness, fear, and rejection relating to family abuse or neglect. Assign the client to record feelings in a journal that describes memories, behavior, and emotions tied to his/her traumatic childhood experiences (or assign "How the Trauma Affects Me" in the Adult Psychotherapy Homework Planner by Beacher Bottoms). Ask the client to read books on the emotional effects of neglect and abuse in childhood (e.g., It Will Never Happen to Me by [facility]; Outgrowing the Pain by Dwyane Glad; Healing the Child Within by [facility]); process insights attained. 13. Reduce overall frequency, intensity, and duration of the anxiety so that daily functioning is not impaired. Objective Reestablish a consistent sleep-wake cycle. Target Date: 2024-06-01 Frequency: Biweekly  Progress: 50 Modality: individual  Related Interventions Teach and implement sleep hygiene practices to help the client reestablish a consistent sleep-wake cycle; review, reinforce success, and  provide corrective feedback toward improvement. Objective Identify, challenge, and replace biased, fearful self-talk with positive, realistic, and empowering self-talk. Target Date: 2024-06-01 Frequency: Biweekly  Progress: 40 Modality: individual  Related Interventions Explore the client's schema and self-talk that mediate his/her fear response; assist him/her in challenging the biases; replace the distorted messages with reality-based alternatives and positive, realistic self-talk that will increase his/her self-confidence in coping with irrational fears (see Cognitive Therapy of Anxiety Disorders by Anderson Kaufman). Assign the client a homework exercise in which he/she identifies fearful self-talk, identifies biases in the self-talk, generates alternatives, and tests through behavioral experiments (or assign "Negative Thoughts Trigger Negative Feelings" in the Adult Psychotherapy Homework Planner by [facility]); review and reinforce success, providing corrective feedback toward improvement. Objective Learn and implement problem-solving strategies for realistically addressing worries. Target Date: 2024-06-01 Frequency: Biweekly  Progress: 60 Modality: individual  Related Interventions Teach the client problem-solving strategies involving specifically defining a problem, generating options for addressing it, evaluating the pros and cons of each option, selecting and implementing an optional action, and reevaluating and refining the action (or assign "Applying Problem-Solving to Interpersonal Conflict" in the Adult Psychotherapy Homework Planner by Beacher Bottoms). Objective Learn and implement calming skills to reduce overall anxiety and manage anxiety symptoms. Target Date: 2024-06-01 Frequency: Biweekly  Progress: 70 Modality: individual  Related Interventions Teach the client calming/relaxation skills (e.g., applied relaxation, progressive muscle relaxation, cue controlled relaxation; mindful breathing;  biofeedback) and how to discriminate better between relaxation and tension; teach the client how to apply these skills to his/her daily life (e.g., New Directions in Progressive Muscle Relaxation by Fara Hone, and Hazlett-Stevens; Treating Generalized Anxiety Disorder by Rygh and Joya Nissen). Assign the client to read about progressive muscle relaxation and other calming strategies in relevant books or treatment manuals (e.g., Progressive Relaxation Training by Rodolfo Clan and Arvil Birks; Mastery of Your Anxiety and Worry: Workbook by Rodney Clamp). Objective Learn and implement relapse prevention strategies for managing possible future anxiety symptoms. Target Date: 2024-06-01 Frequency: Biweekly  Progress: 40 Modality: individual  Related Interventions Discuss with the client the distinction between a lapse and relapse, associating a lapse with an initial and reversible return of worry, anxiety symptoms, or urges to avoid, and relapse with the decision to continue the fearful and avoidant patterns. Identify and rehearse with the client the management of future situations or circumstances in which lapses could occur. Instruct the client to routinely use new therapeutic skills (e.g., relaxation, cognitive restructuring, exposure, and problem-solving)  in daily life to address emergent worries, anxiety, and avoidant tendencies. Develop a "coping card" on which coping strategies and other important information (e.g., "Breathe deeply and relax," "Challenge unrealistic worries," "Use problem-solving") are written for the client's later use.  Diagnosis:Major depressive disorder, recurrent episode, moderate (HCC)  Generalized anxiety disorder  PTSD (post-traumatic stress disorder)  Plan:  -meet again Thursday, October 06, 2023 at 8am in-person

## 2023-09-29 ENCOUNTER — Ambulatory Visit: Admitting: Professional

## 2023-10-06 ENCOUNTER — Encounter: Payer: Self-pay | Admitting: Professional

## 2023-10-06 ENCOUNTER — Ambulatory Visit (INDEPENDENT_AMBULATORY_CARE_PROVIDER_SITE_OTHER): Admitting: Professional

## 2023-10-06 DIAGNOSIS — F331 Major depressive disorder, recurrent, moderate: Secondary | ICD-10-CM | POA: Diagnosis not present

## 2023-10-06 DIAGNOSIS — F411 Generalized anxiety disorder: Secondary | ICD-10-CM

## 2023-10-06 DIAGNOSIS — F4310 Post-traumatic stress disorder, unspecified: Secondary | ICD-10-CM

## 2023-10-06 NOTE — Progress Notes (Signed)
 [facility] Counselor/Therapist Progress Note  Patient ID: Shannon [name], MRN: [id number],    Date: 09/22/2023  Time Spent: 63 minutes [id number]  Treatment Type: Individual Therapy  Risk Assessment: Danger to Self:  No Self-injurious Behavior: No Danger to Others: No  Subjective: The patient arrived on time for her in person appointment.   Issues addressed: 1-mood -tearful and sad -continued grief 2-loss of husband's dog Shannon [name] -death was Memorial Day November 12, 2023 when they were to leave for beach -first significant death her spouse has experienced and he is struggling -spouse comment that he cannot imagine her dying first because he would not survive -they have been experiencing increased intimacy and this has been positive 3-depressed and anxious -husband commented he thinks she would be lost if she did not have something to worry about -pt has halfed her dosage of Wellbutrin  -pt has completely stopped drinking -pt has not been able to work due to increased rain and inability to hold her swim lessons -discussed pride and the potential that this is getting in the way; pt admits that it is a pride issue -suggested pt consider evaluation by psychiatry for medication needs -increase activities for coping -reduce social isolation and consider attending grief support group -increase structure -get involved to increase opportunities for friendships  Treatment Plan Problems Addressed  Anxiety, Childhood Trauma, Low Self-Esteem, Unipolar Depression Goals 1. Alleviate depressive symptoms and return to previous level of effective functioning. 2. Appropriately grieve the loss in order to normalize mood and to return to previously adaptive level of functioning. Objective Learn and implement behavioral strategies to overcome depression. Target Date: 2024-06-01 Frequency: Biweekly  Progress: 60 Modality: individual  Related Interventions Engage the client in "behavioral  activation," increasing his/her activity level and contact with sources of reward, while identifying processes that inhibit activation (see Behavioral Activation for Depression by Lynette Saras, Dimidjian, and Herman-Dunn; or assign "Identify and Schedule Pleasant Activities" in the Adult Psychotherapy Homework Planner by [facility]); use behavioral techniques such as instruction, rehearsal, role-playing, role reversal, as needed, to facilitate activity in the client's daily life; reinforce success. Assist the client in developing skills that increase the likelihood of deriving pleasure from behavioral activation (e.g., assertiveness skills, developing an exercise plan, less internal/more external focus, increased social involvement); reinforce success. Objective Learn and implement conflict resolution skills to resolve interpersonal problems. Target Date: 2024-06-01 Frequency: Biweekly  Progress: 70 Modality: individual  Related Interventions Teach conflict resolution skills (e.g., empathy, active listening, "I messages," respectful communication, assertiveness without aggression, compromise); use psychoeducation, modeling, role-playing, and rehearsal to work through several current conflicts; assign homework exercises; review and repeat so as to integrate their use into the client's life. Objective Learn and implement relapse prevention skills. Target Date: 2024-06-01 Frequency: Biweekly  Progress: 40 Modality: individual  Related Interventions Identify and rehearse with the client the management of future situations or circumstances in which lapses could occur. Objective Verbalize an understanding of healthy and unhealthy emotions with the intent of increasing the use of healthy emotions to guide actions. Target Date: 2024-06-01 Frequency: Biweekly  Progress: 80 Modality: individual  Related Interventions Use a process-experiential approach consistent with Emotion-Focused Therapy to create a safe,  nurturing environment in which the client can process emotions, learning to identify and regulate unhealthy feelings and to generate more adaptive ones that then guide actions (see Emotion-Focused Therapy for Depression by Moreen Apt). 3. Demonstrate improved self-esteem through more pride in appearance, more assertiveness, greater eye contact, and identification of positive traits in self-talk messages.  4. Develop an awareness of how childhood issues have affected and continue to affect one's family life. 5. Develop healthy interpersonal relationships that lead to the alleviation and help prevent the relapse of depression. 6. Develop healthy thinking patterns and beliefs about self, others, and the world that lead to the alleviation and help prevent the relapse of depression. 7. Elevate self-esteem. Objective Demonstrate an increased ability to identify and express personal feelings. Target Date: 2024-06-01 Frequency: Biweekly  Progress: 50 Modality: individual  Related Interventions Assist the client in identifying and labeling emotions. Objective Acknowledge feeling less competent than most others. Target Date: 06-01-2024 Frequency: Biweekly  Progress: 70 Modality: individual  Related Interventions Explore the client's assessment of himself/herself and what is verbalized as the basis for negative self-perception. Actively build the level of trust with the client in individual sessions through consistent eye contact, active listening, unconditional positive regard, and warm acceptance to help increase his/her ability to identify and express feelings. Objective Decrease the frequency of negative self-descriptive statements and increase frequency of positive self-descriptive statements. Target Date: 2024-06-01 Frequency: Biweekly  Progress: 40 Modality: individual  Related Interventions Assist the client in becoming aware of how he/she expresses or acts out negative feelings about  himself/herself. Help the client reframe his/her negative assessment of himself/herself. Assist the client in developing positive self-talk as a way of boosting his/her confidence and self-image (or assign "Positive Self-Talk" in the Adult Psychotherapy Homework Planner by Beacher Bottoms). Objective Identify and engage in activities that would improve self-image by being consistent with one's values. Target Date: 2024-06-01 Frequency: Biweekly  Progress: 60 Modality: individual  Related Interventions Help the client analyze his/her values and the congruence or incongruence between them and the client's daily activities. Identify and assign activities congruent with the client's values; process them toward improving self-concept and self-esteem. Objective Identify positive traits and talents about self. Target Date: 2024-06-01 Frequency: Biweekly  Progress: 40 Modality: individual  Related Interventions Assign the client the exercise of identifying his/her positive physical characteristics in a mirror to help him/her become more comfortable with himself/herself. Ask the client to keep building a list of positive traits and have him/her read the list at the beginning and end of each session (or assign "Acknowledging My Strengths" or "What Are My Good Qualities?" in the Adult Psychotherapy Homework Planner by [facility]); reinforce the client's positive self-descriptive statements. 8. Enhance ability to effectively cope with the full variety of life's worries and anxieties. 9. Establish an inward sense of self-worth, confidence, and competence. 10. Interact socially without undue distress or disability. 11. Learn and implement coping skills that result in a reduction of anxiety and worry, and improved daily functioning. 12. Let go of blame and begin to forgive others for pain caused in childhood. Objective Increase level of trust of others as shown by more socialization and greater intimacy  tolerance. Target Date: 2024-06-01 Frequency: Biweekly  Progress: 60 Modality: individual  Related Interventions Teach the client the share-check method of building trust in relationships (sharing a little information and checking as to the recipient's sensitivity in reacting to that information). Teach the client the advantages of treating people as trustworthy given a reasonable amount of time to assess their character. Objective Describe each family member and identify the role each played within the family. Target Date: 2024-06-01 Frequency: Biweekly  Progress: 80 Modality: individual  Related Interventions Assist the client in clarifying his/her role within the family and his/her feelings connected to that role. Objective Identify feelings associated with major traumatic incidents  in childhood and with parental child-rearing patterns. Target Date: 2024-06-01 Frequency: Biweekly  Progress: 70 Modality: individual  Related Interventions Support and encourage the client when he/she begins to express feelings of rage, sadness, fear, and rejection relating to family abuse or neglect. Assign the client to record feelings in a journal that describes memories, behavior, and emotions tied to his/her traumatic childhood experiences (or assign "How the Trauma Affects Me" in the Adult Psychotherapy Homework Planner by Beacher Bottoms). Ask the client to read books on the emotional effects of neglect and abuse in childhood (e.g., It Will Never Happen to Me by [facility]; Outgrowing the Pain by Dwyane Glad; Healing the Child Within by [facility]); process insights attained. 13. Reduce overall frequency, intensity, and duration of the anxiety so that daily functioning is not impaired. Objective Reestablish a consistent sleep-wake cycle. Target Date: 2024-06-01 Frequency: Biweekly  Progress: 50 Modality: individual  Related Interventions Teach and implement sleep hygiene practices to help the client reestablish a  consistent sleep-wake cycle; review, reinforce success, and provide corrective feedback toward improvement. Objective Identify, challenge, and replace biased, fearful self-talk with positive, realistic, and empowering self-talk. Target Date: 2024-06-01 Frequency: Biweekly  Progress: 40 Modality: individual  Related Interventions Explore the client's schema and self-talk that mediate his/her fear response; assist him/her in challenging the biases; replace the distorted messages with reality-based alternatives and positive, realistic self-talk that will increase his/her self-confidence in coping with irrational fears (see Cognitive Therapy of Anxiety Disorders by Anderson Kaufman). Assign the client a homework exercise in which he/she identifies fearful self-talk, identifies biases in the self-talk, generates alternatives, and tests through behavioral experiments (or assign "Negative Thoughts Trigger Negative Feelings" in the Adult Psychotherapy Homework Planner by [facility]); review and reinforce success, providing corrective feedback toward improvement. Objective Learn and implement problem-solving strategies for realistically addressing worries. Target Date: 2024-06-01 Frequency: Biweekly  Progress: 60 Modality: individual  Related Interventions Teach the client problem-solving strategies involving specifically defining a problem, generating options for addressing it, evaluating the pros and cons of each option, selecting and implementing an optional action, and reevaluating and refining the action (or assign "Applying Problem-Solving to Interpersonal Conflict" in the Adult Psychotherapy Homework Planner by Beacher Bottoms). Objective Learn and implement calming skills to reduce overall anxiety and manage anxiety symptoms. Target Date: 2024-06-01 Frequency: Biweekly  Progress: 70 Modality: individual  Related Interventions Teach the client calming/relaxation skills (e.g., applied relaxation, progressive  muscle relaxation, cue controlled relaxation; mindful breathing; biofeedback) and how to discriminate better between relaxation and tension; teach the client how to apply these skills to his/her daily life (e.g., New Directions in Progressive Muscle Relaxation by Fara Hone, and Hazlett-Stevens; Treating Generalized Anxiety Disorder by Rygh and Joya Nissen). Assign the client to read about progressive muscle relaxation and other calming strategies in relevant books or treatment manuals (e.g., Progressive Relaxation Training by Rodolfo Clan and Arvil Birks; Mastery of Your Anxiety and Worry: Workbook by Rodney Clamp). Objective Learn and implement relapse prevention strategies for managing possible future anxiety symptoms. Target Date: 2024-06-01 Frequency: Biweekly  Progress: 40 Modality: individual  Related Interventions Discuss with the client the distinction between a lapse and relapse, associating a lapse with an initial and reversible return of worry, anxiety symptoms, or urges to avoid, and relapse with the decision to continue the fearful and avoidant patterns. Identify and rehearse with the client the management of future situations or circumstances in which lapses could occur. Instruct the client to routinely use new therapeutic skills (e.g., relaxation, cognitive restructuring, exposure, and problem-solving) in  daily life to address emergent worries, anxiety, and avoidant tendencies. Develop a "coping card" on which coping strategies and other important information (e.g., "Breathe deeply and relax," "Challenge unrealistic worries," "Use problem-solving") are written for the client's later use.  Diagnosis:Major depressive disorder, recurrent episode, moderate (HCC)  Generalized anxiety disorder  PTSD (post-traumatic stress disorder)  Plan:  -meet again Thursday, October 20, 2023 at 8am in-person

## 2023-10-07 DIAGNOSIS — S73191A Other sprain of right hip, initial encounter: Secondary | ICD-10-CM | POA: Diagnosis not present

## 2023-10-07 DIAGNOSIS — M160 Bilateral primary osteoarthritis of hip: Secondary | ICD-10-CM | POA: Diagnosis not present

## 2023-10-12 ENCOUNTER — Ambulatory Visit: Payer: Self-pay

## 2023-10-12 NOTE — Telephone Encounter (Signed)
 FYI Only or Action Required?: FYI only for provider  Patient was last seen in primary care on 06/30/2023 by Sylvia Everts, PA-C. Called Nurse Triage reporting Depression. Symptoms began chronic. Interventions attempted: Rest, hydration, or home remedies. Symptoms are: stable.  Triage Disposition: See PCP When Office is Open (Within 3 Days)  Patient/caregiver understands and will follow disposition?: Yes, but will wait  Copied from CRM [id number]. Topic: Clinical - Red Word Triage >> Oct 12, 2023 10:12 AM Everlene Hobby D wrote: Patient is having problems with depression and anxiety Reason for Disposition  [1] New or changed psychiatric medications > 2 weeks ago AND [2] not feeling any better  Answer Assessment - Initial Assessment Questions 1. CONCERN: What happened that made you call today?     Discuss medication      2.. RISK OF HARM - SUICIDAL IDEATION:  Do you ever have thoughts of hurting or killing yourself?  (e.g., yes, no, no but preoccupation with thoughts about death)   - INTENT:  Do you have thoughts of hurting or killing yourself right NOW? (e.g., yes, no, N/A)   - PLAN: Do you have a specific plan for how you would do this? (e.g., gun, knife, overdose, no plan, N/A)     Denies 4. RISK OF HARM - HOMICIDAL IDEATION:  Do you ever have thoughts of hurting or killing someone else?  (e.g., yes, no, no but preoccupation with thoughts about death)   - INTENT:  Do you have thoughts of hurting or killing someone right NOW? (e.g., yes, no, N/A)   - PLAN: Do you have a specific plan for how you would do this? (e.g., gun, knife, no plan, N/A)      Denies  Additional info: Requesting to schedule an appointment with PCP Melissa, she wants to discuss the medication she is on and doses, they have been communicating via mychart but at this time would like in person visit.  Protocols used: Depression-A-AH

## 2023-10-13 ENCOUNTER — Ambulatory Visit: Admitting: Professional

## 2023-10-16 ENCOUNTER — Other Ambulatory Visit: Payer: Self-pay | Admitting: Family

## 2023-10-16 DIAGNOSIS — F419 Anxiety disorder, unspecified: Secondary | ICD-10-CM

## 2023-10-17 DIAGNOSIS — M180 Bilateral primary osteoarthritis of first carpometacarpal joints: Secondary | ICD-10-CM | POA: Diagnosis not present

## 2023-10-17 DIAGNOSIS — M19031 Primary osteoarthritis, right wrist: Secondary | ICD-10-CM | POA: Diagnosis not present

## 2023-10-18 ENCOUNTER — Ambulatory Visit: Admitting: Family

## 2023-10-18 VITALS — BP 114/70 | HR 84 | Temp 98.8°F | Resp 16 | Ht 65.0 in | Wt 113.0 lb

## 2023-10-18 DIAGNOSIS — F411 Generalized anxiety disorder: Secondary | ICD-10-CM

## 2023-10-18 DIAGNOSIS — F410 Panic disorder [episodic paroxysmal anxiety] without agoraphobia: Secondary | ICD-10-CM | POA: Diagnosis not present

## 2023-10-18 DIAGNOSIS — Z780 Asymptomatic menopausal state: Secondary | ICD-10-CM

## 2023-10-18 DIAGNOSIS — F331 Major depressive disorder, recurrent, moderate: Secondary | ICD-10-CM

## 2023-10-18 MED ORDER — BUPROPION HCL ER (XL) 150 MG PO TB24: 300.0000 mg | ORAL_TABLET | Freq: Every day | ORAL | Status: DC

## 2023-10-18 NOTE — Assessment & Plan Note (Signed)
  Increased anxiety with reduced Wellbutrin  dosage. Klonopin  necessary in the morning to prevent crying and worrying. Anxiety exacerbated by medication changes and personal stressors. - Continue Klonopin  as needed for anxiety management. - Monitor anxiety levels with increased Wellbutrin  dosage. -Controlled substance contract up to date.

## 2023-10-18 NOTE — Addendum Note (Signed)
 Addended by: Dorrene Gaucher on: 10/18/2023 09:25 AM   Modules accepted: Level of Service

## 2023-10-18 NOTE — Patient Instructions (Addendum)
 VISIT SUMMARY:  During your visit, we discussed your concerns about medication adjustments for depression and anxiety, joint pain management, and general health maintenance. We have made some changes to your medication plan and discussed future steps for your joint health.  YOUR PLAN:  DEPRESSION: Your depression has worsened after reducing your Wellbutrin  dosage. We discussed the potential need for additional medication if your libido decreases at a higher dose. -Increase Wellbutrin  to 300 mg daily by taking two 150 mg tablets in the morning. -Monitor your mood and libido over the next two weeks and report back. -Consider adding a low dose SSRI if your mood improves but libido decreases.  ANXIETY: Your anxiety has increased with the reduced Wellbutrin  dosage and personal stressors. Klonopin  is necessary in the morning to manage crying and worrying. -Continue taking Klonopin  as needed for anxiety management. -Monitor your anxiety levels with the increased Wellbutrin  dosage.  JOINT DEGENERATION: You have degenerative joint issues that require a hip replacement. Your pain is currently managed with Nucynta. -continue your follow up with orthopedics.  GENERAL HEALTH MAINTENANCE: You have not had a bone density test despite joint issues and age-related risk factors. -A bone density test has been ordered.

## 2023-10-18 NOTE — Progress Notes (Signed)
 Subjective:     Patient ID: Shannon [name], female    DOB: [birth year], 62 y.o.   MRN: [id number]  Chief Complaint  Patient presents with   Depression    Patient reports increased depression symptoms   Anxiety    Patient reports increased in anxiety symptoms.    Depression        Past medical history includes anxiety.   Anxiety      Discussed the use of AI scribe software for clinical note transcription with the patient, who gave verbal consent to proceed.  History of Present Illness   Shannon [name] is a 63 year old female with depression and anxiety who presents with concerns about medication adjustments and mental health symptoms.  She was stable on 300 mg of Wellbutrin  extended release for five years but reduced the dose to 150 mg, leading to nausea and withdrawal symptoms. On the lower dose, she experienced increased anxiety and crying spells, though her sex drive improved. She currently takes 150 mg of Wellbutrin  extended release, Buspar  15 mg twice daily, and Klonopin , which she questions for efficacy as she needs it in the morning to manage crying and worrying.  She has experienced weight gain from previous psychiatric medications, resulting in a negative experience and a brief stay in behavioral health care. She finds counseling with Devera Fluke every other week beneficial.  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 She takes Nucynta for pain, usually one tablet, and two on bad days. She is interested in a bone density test due to her joint issues.  Her family history includes the recent loss of her parents, which has been emotionally challenging. Her father passed away two years ago from a massive brain hemorrhage, and her mother passed away less than a year ago after a fall and subsequent health decline.    There are no preventive care reminders to display for  this patient.  Past Medical History:  Diagnosis Date   Anemia    Atypical chest pain 03/08/2012   Bilateral thumb pain 01/12/2018   Bilateral wrist pain 01/12/2018   Cervical spondylosis without myelopathy 02/12/2016   Chronic midline posterior neck pain 06/19/2015   Concussion without loss of consciousness 06/18/2017   Constipation 01/14/2021   Ecchymosis 05/25/2011   Family history of colonic polyps 12/26/2017   Generalized anxiety disorder with panic attacks 12/30/2010   Grief reaction 02/03/2022   History of eating disorder    anorexia nervosa in her 29-Oct-2023   Kidney stones    passed on their own   Laceration of left thumb 01/14/2021   Low back pain 03/03/2020   Lumbar radicular pain 06/19/2015   Major depressive disorder 12/23/2012   Mild neurocognitive disorder due to another medical condition 10/20/2017   Mixed obsessional thoughts and acts 03/09/2017   Osteoarthritis of carpometacarpal (CMC) joint of thumb 01/10/2020   Postlaminectomy syndrome, lumbar region 06/19/2015   Preventative health care 02/15/2012   Raynaud's disease 03/23/2013   Recurrent major depressive disorder, in partial remission (HCC) 03/09/2017   Sacroiliac dysfunction 06/19/2015   Spinal stenosis of lumbar region with radiculopathy 04/15/2022   Spondylosis of lumbar region without myelopathy or radiculopathy 07/15/2015   Stomach disorder    due to complications with cholecystectomy almost died    Past Surgical History:  Procedure Laterality Date   APPENDECTOMY  10/29/74   BREAST BIOPSY Right    CHOLECYSTECTOMY  08/2009  reports history of gallbladder polyps, she reports stents in duct of lushca    COLONOSCOPY  2022   HYSTEROSCOPY  07/2011   LAMINECTOMY  1994   L4-5   LUMBAR LAMINECTOMY/DECOMPRESSION MICRODISCECTOMY N/A 04/15/2022   Procedure: LUMBAR THREE-FOUR LUMBAR LAMINECTOMY;  Surgeon: Orvan Blanch, MD;  Location: [facility] OR;  Service: Orthopedics;  Laterality: N/A;  2 hrs 3 C-Bed    TONSILLECTOMY  1982   TUBAL LIGATION      Family History  Problem Relation Age of Onset   Hypertension Mother    Memory loss Mother    Dementia Mother    Alcoholism Mother    Diabetes Father    Hypertension Father    Hypothyroidism Father    CAD Father    Stroke Father    Adrenal disorder Father    Adrenal disorder Sister    Hashimoto's thyroiditis Sister    Hypothyroidism Sister    Raynaud syndrome Sister    Neuropathy Sister        chronic inflammatory demyelinating peripheral neuropathy from lyme disease   Alcohol abuse Maternal Grandmother    Cancer Paternal Grandfather        lung cancer   Hypothyroidism Other     Social History   Socioeconomic History   Marital status: Married    Spouse name: Not on file   Number of children: 3   Years of education: 15   Highest education level: Associate degree: academic program  Occupational History    Employer: SELF EMPLOYED  Tobacco Use   Smoking status: Never   Smokeless tobacco: Never  Vaping Use   Vaping status: Never Used  Substance and Sexual Activity   Alcohol use: Yes    Comment: occ.   Drug use: Not Currently   Sexual activity: Yes    Partners: Male    Birth control/protection: Surgical  Other Topics Concern   Not on file  Social History Narrative   Regular exercise:  Yes (swim instructor)   Caffeine Use:  1 daily   Married 3 children ages 52 son, 14 son, and 46 daughter   Associates degree   Right handed          Social Drivers of Corporate investment banker Strain: Not on file  Food Insecurity: Not on file  Transportation Needs: Not on file  Physical Activity: Not on file  Stress: Not on file  Social Connections: Not on file  Intimate Partner Violence: Not on file    Outpatient Medications Prior to Visit  Medication Sig Dispense Refill   busPIRone  (BUSPAR ) 15 MG tablet Take 1 tablet (15 mg total) by mouth 2 (two) times daily. 180 tablet 1   KLONOPIN  0.5 MG tablet TAKE 1 TABLET BY MOUTH 3  TIMES A DAY AS NEEDED FOR ANXIETY 90 tablet 0   lidocaine  (LIDODERM ) 5 % Place 1 patch onto the skin daily as needed (pain). Remove & Discard patch within 12 hours or as directed by MD     LINZESS  290 MCG CAPS capsule Take 290 mcg by mouth daily.     MAGNESIUM  GLYCINATE PO Take 120 mg by mouth at bedtime.     melatonin 3 MG TABS tablet Take 3 mg by mouth at bedtime.     OVER THE COUNTER MEDICATION Take 1 capsule by mouth daily. Brain Elevate     polyethylene glycol (MIRALAX  / GLYCOLAX ) 17 g packet Take 17 g by mouth daily. 14 each 0   Turmeric (QC TUMERIC COMPLEX PO)  Take by mouth.     buPROPion  (WELLBUTRIN  XL) 150 MG 24 hr tablet Take 1 tablet (150 mg total) by mouth daily. 90 tablet 0   azelastine  (ASTELIN ) 0.1 % nasal spray Place 2 sprays into both nostrils 2 (two) times daily. Use in each nostril as directed 30 mL 0   benzonatate  (TESSALON ) 100 MG capsule Take 1 capsule (100 mg total) by mouth 3 (three) times daily as needed for cough. 30 capsule 0   cyanocobalamin  (VITAMIN B12) 500 MCG tablet Take 500 mcg by mouth daily.     Gamma-Aminobutyric Acid (GABA PO) Take by mouth.     pregabalin (LYRICA) 50 MG capsule Take 50 mg by mouth 2 (two) times daily.     No facility-administered medications prior to visit.    Allergies  Allergen Reactions   Buprenorphine Hcl     No relief   Cortisporin [Neomycin-Polymyxin-Hc] Swelling    Ear dropps   Eszopiclone     Headaches     Flexeril [Cyclobenzaprine Hcl] Other (See Comments)    Mood swings   Gabapentin     sedation   Morphine And Codeine Other (See Comments)    No relief   Nsaids Other (See Comments)    GI bleed   Paroxetine  Nausea Only        Shellfish Allergy Other (See Comments)    skin & mouth tingle   Tizanidine Hcl Other (See Comments)    Dizziness, mood swings   Tolmetin     GI bleed    Review of Systems  Psychiatric/Behavioral:  Positive for depression.        Objective:    Physical Exam Constitutional:       Appearance: Normal appearance.   Neurological:     Mental Status: She is alert.   Psychiatric:        Attention and Perception: Attention normal.        Mood and Affect: Affect is tearful.        Behavior: Behavior normal.        Thought Content: Thought content normal.        Judgment: Judgment normal.      BP 114/70 (BP Location: Right Arm, Patient Position: Sitting, Cuff Size: Small)   Pulse 84   Temp 98.8 F (37.1 C) (Oral)   Resp 16   Ht 5' 5 (1.651 m)   Wt 113 lb (51.3 kg)   LMP 03/19/2017   SpO2 100%   BMI 18.80 kg/m  Wt Readings from Last 3 Encounters:  10/18/23 113 lb (51.3 kg)  05/30/23 118 lb (53.5 kg)  11/22/22 113 lb 6.4 oz (51.4 kg)       Assessment & Plan:   Problem List Items Addressed This Visit       Unprioritized   Recurrent major depressive disorder (HCC)   Chronic depression worsened by reduced Wellbutrin  dosage. Discussed SSRI addition if libido decreases at higher dose.  - Increase Wellbutrin  to 300 mg daily by taking two 150 mg tablets in the morning. - Monitor mood and libido over the next two weeks and report back. - Consider adding a low dose SSRI if mood improves but libido decreases.      Relevant Medications   buPROPion  (WELLBUTRIN  XL) 150 MG 24 hr tablet   Generalized anxiety disorder with panic attacks    Increased anxiety with reduced Wellbutrin  dosage. Klonopin  necessary in the morning to prevent crying and worrying. Anxiety exacerbated by medication changes and personal stressors. -  Continue Klonopin  as needed for anxiety management. - Monitor anxiety levels with increased Wellbutrin  dosage. -Controlled substance contract up to date.       Relevant Medications   buPROPion  (WELLBUTRIN  XL) 150 MG 24 hr tablet   Other Visit Diagnoses       Postmenopausal estrogen deficiency    -  Primary   Relevant Orders   DG Bone Density       I have discontinued Tashae Derks's cyanocobalamin , pregabalin, Gamma-Aminobutyric  Acid (GABA PO), azelastine , and benzonatate . I have also changed her buPROPion . Additionally, I am having her maintain her Linzess , melatonin, MAGNESIUM  GLYCINATE PO, OVER THE COUNTER MEDICATION, lidocaine , polyethylene glycol, Turmeric (QC TUMERIC COMPLEX PO), busPIRone , and KlonoPIN .  Meds ordered this encounter  Medications   buPROPion  (WELLBUTRIN  XL) 150 MG 24 hr tablet    Sig: Take 2 tablets (300 mg total) by mouth daily.    Supervising Provider:   Randie Bustle A [phone]

## 2023-10-18 NOTE — Assessment & Plan Note (Signed)
 Chronic depression worsened by reduced Wellbutrin  dosage. Discussed SSRI addition if libido decreases at higher dose.  - Increase Wellbutrin  to 300 mg daily by taking two 150 mg tablets in the morning. - Monitor mood and libido over the next two weeks and report back. - Consider adding a low dose SSRI if mood improves but libido decreases.

## 2023-10-20 ENCOUNTER — Ambulatory Visit: Admitting: Professional

## 2023-10-20 DIAGNOSIS — F4310 Post-traumatic stress disorder, unspecified: Secondary | ICD-10-CM

## 2023-10-20 DIAGNOSIS — F411 Generalized anxiety disorder: Secondary | ICD-10-CM

## 2023-10-20 DIAGNOSIS — F331 Major depressive disorder, recurrent, moderate: Secondary | ICD-10-CM

## 2023-10-27 ENCOUNTER — Ambulatory Visit: Admitting: Professional

## 2023-10-31 ENCOUNTER — Encounter: Payer: Self-pay | Admitting: Professional

## 2023-10-31 NOTE — Progress Notes (Signed)
 [facility] Counselor/Therapist Progress Note  Patient ID: Shannon [name], MRN: [id number],    Date: 10/20/2023  Time Spent: 63 minutes 808-901am  Treatment Type: Individual Therapy  Risk Assessment: Danger to Self:  No Self-injurious Behavior: No Danger to Others: No  Subjective: The patient arrived on time for her in person appointment.   Issues addressed: 1-mood -sad but able to control her tearfulness 2-medication monitoring -pt was seen by her PCP Shannon [name] -increasing back up to 300mg  -pt cannot recall a time she has been depression-free -suggested pt consider evaluation by psychiatric practitioner 3-social -pt went out to dinner with some ladies but did not enjoy -pt felt uncomfortable -she and spouse did resume their dance class -it was the first night that she had fun and did not dread going 4-move to beach -pt and spouse have discussed being ready for him to retire and them move to beach -pt admits that she is happiest when she is there -she wants to spend her time with her spouse -she has spent so much time preoccupied with raising children that she has neglected spouse -pt wants to invest in her spouse  Treatment Plan Problems Addressed  Anxiety, Childhood Trauma, Low Self-Esteem, Unipolar Depression Goals 1. Alleviate depressive symptoms and return to previous level of effective functioning. 2. Appropriately grieve the loss in order to normalize mood and to return to previously adaptive level of functioning. Objective Learn and implement behavioral strategies to overcome depression. Target Date: 2024-06-01 Frequency: Biweekly  Progress: 60 Modality: individual  Related Interventions Engage the client in behavioral activation, increasing his/her activity level and contact with sources of reward, while identifying processes that inhibit activation (see Behavioral Activation for Depression by Loleta, Dimidjian, and Herman-Dunn; or  assign Identify and Schedule Pleasant Activities in the Adult Psychotherapy Homework Planner by [facility]); use behavioral techniques such as instruction, rehearsal, role-playing, role reversal, as needed, to facilitate activity in the client's daily life; reinforce success. Assist the client in developing skills that increase the likelihood of deriving pleasure from behavioral activation (e.g., assertiveness skills, developing an exercise plan, less internal/more external focus, increased social involvement); reinforce success. Objective Learn and implement conflict resolution skills to resolve interpersonal problems. Target Date: 2024-06-01 Frequency: Biweekly  Progress: 70 Modality: individual  Related Interventions Teach conflict resolution skills (e.g., empathy, active listening, I messages, respectful communication, assertiveness without aggression, compromise); use psychoeducation, modeling, role-playing, and rehearsal to work through several current conflicts; assign homework exercises; review and repeat so as to integrate their use into the client's life. Objective Learn and implement relapse prevention skills. Target Date: 2024-06-01 Frequency: Biweekly  Progress: 40 Modality: individual  Related Interventions Identify and rehearse with the client the management of future situations or circumstances in which lapses could occur. Objective Verbalize an understanding of healthy and unhealthy emotions with the intent of increasing the use of healthy emotions to guide actions. Target Date: 2024-06-01 Frequency: Biweekly  Progress: 80 Modality: individual  Related Interventions Use a process-experiential approach consistent with Emotion-Focused Therapy to create a safe, nurturing environment in which the client can process emotions, learning to identify and regulate unhealthy feelings and to generate more adaptive ones that then guide actions (see Emotion-Focused Therapy for Depression by  Audrey armin Portugal). 3. Demonstrate improved self-esteem through more pride in appearance, more assertiveness, greater eye contact, and identification of positive traits in self-talk messages. 4. Develop an awareness of how childhood issues have affected and continue to affect one's family life. 5. Develop healthy interpersonal relationships that  lead to the alleviation and help prevent the relapse of depression. 6. Develop healthy thinking patterns and beliefs about self, others, and the world that lead to the alleviation and help prevent the relapse of depression. 7. Elevate self-esteem. Objective Demonstrate an increased ability to identify and express personal feelings. Target Date: 2024-06-01 Frequency: Biweekly  Progress: 50 Modality: individual  Related Interventions Assist the client in identifying and labeling emotions. Objective Acknowledge feeling less competent than most others. Target Date: 06-01-2024 Frequency: Biweekly  Progress: 70 Modality: individual  Related Interventions Explore the client's assessment of himself/herself and what is verbalized as the basis for negative self-perception. Actively build the level of trust with the client in individual sessions through consistent eye contact, active listening, unconditional positive regard, and warm acceptance to help increase his/her ability to identify and express feelings. Objective Decrease the frequency of negative self-descriptive statements and increase frequency of positive self-descriptive statements. Target Date: 2024-06-01 Frequency: Biweekly  Progress: 40 Modality: individual  Related Interventions Assist the client in becoming aware of how he/she expresses or acts out negative feelings about himself/herself. Help the client reframe his/her negative assessment of himself/herself. Assist the client in developing positive self-talk as a way of boosting his/her confidence and self-image (or assign Positive  Self-Talk in the Adult Psychotherapy Homework Planner by Jenniffer). Objective Identify and engage in activities that would improve self-image by being consistent with one's values. Target Date: 2024-06-01 Frequency: Biweekly  Progress: 60 Modality: individual  Related Interventions Help the client analyze his/her values and the congruence or incongruence between them and the client's daily activities. Identify and assign activities congruent with the client's values; process them toward improving self-concept and self-esteem. Objective Identify positive traits and talents about self. Target Date: 2024-06-01 Frequency: Biweekly  Progress: 40 Modality: individual  Related Interventions Assign the client the exercise of identifying his/her positive physical characteristics in a mirror to help him/her become more comfortable with himself/herself. Ask the client to keep building a list of positive traits and have him/her read the list at the beginning and end of each session (or assign Acknowledging My Strengths or What Are My Good Qualities? in the Adult Psychotherapy Homework Planner by [facility]); reinforce the client's positive self-descriptive statements. 8. Enhance ability to effectively cope with the full variety of life's worries and anxieties. 9. Establish an inward sense of self-worth, confidence, and competence. 10. Interact socially without undue distress or disability. 11. Learn and implement coping skills that result in a reduction of anxiety and worry, and improved daily functioning. 12. Let go of blame and begin to forgive others for pain caused in childhood. Objective Increase level of trust of others as shown by more socialization and greater intimacy tolerance. Target Date: 2024-06-01 Frequency: Biweekly  Progress: 60 Modality: individual  Related Interventions Teach the client the share-check method of building trust in relationships (sharing a little information and  checking as to the recipient's sensitivity in reacting to that information). Teach the client the advantages of treating people as trustworthy given a reasonable amount of time to assess their character. Objective Describe each family member and identify the role each played within the family. Target Date: 2024-06-01 Frequency: Biweekly  Progress: 80 Modality: individual  Related Interventions Assist the client in clarifying his/her role within the family and his/her feelings connected to that role. Objective Identify feelings associated with major traumatic incidents in childhood and with parental child-rearing patterns. Target Date: 2024-06-01 Frequency: Biweekly  Progress: 70 Modality: individual  Related Interventions Support and encourage  the client when he/she begins to express feelings of rage, sadness, fear, and rejection relating to family abuse or neglect. Assign the client to record feelings in a journal that describes memories, behavior, and emotions tied to his/her traumatic childhood experiences (or assign How the Trauma Affects Me in the Adult Psychotherapy Homework Planner by Jenniffer). Ask the client to read books on the emotional effects of neglect and abuse in childhood (e.g., It Will Never Happen to Me by [facility]; Outgrowing the Pain by Tory; Healing the Child Within by [facility]); process insights attained. 13. Reduce overall frequency, intensity, and duration of the anxiety so that daily functioning is not impaired. Objective Reestablish a consistent sleep-wake cycle. Target Date: 2024-06-01 Frequency: Biweekly  Progress: 50 Modality: individual  Related Interventions Teach and implement sleep hygiene practices to help the client reestablish a consistent sleep-wake cycle; review, reinforce success, and provide corrective feedback toward improvement. Objective Identify, challenge, and replace biased, fearful self-talk with positive, realistic, and empowering  self-talk. Target Date: 2024-06-01 Frequency: Biweekly  Progress: 40 Modality: individual  Related Interventions Explore the client's schema and self-talk that mediate his/her fear response; assist him/her in challenging the biases; replace the distorted messages with reality-based alternatives and positive, realistic self-talk that will increase his/her self-confidence in coping with irrational fears (see Cognitive Therapy of Anxiety Disorders by Gretta armin Mon). Assign the client a homework exercise in which he/she identifies fearful self-talk, identifies biases in the self-talk, generates alternatives, and tests through behavioral experiments (or assign Negative Thoughts Trigger Negative Feelings in the Adult Psychotherapy Homework Planner by [facility]); review and reinforce success, providing corrective feedback toward improvement. Objective Learn and implement problem-solving strategies for realistically addressing worries. Target Date: 2024-06-01 Frequency: Biweekly  Progress: 60 Modality: individual  Related Interventions Teach the client problem-solving strategies involving specifically defining a problem, generating options for addressing it, evaluating the pros and cons of each option, selecting and implementing an optional action, and reevaluating and refining the action (or assign Applying Problem-Solving to Interpersonal Conflict in the Adult Psychotherapy Homework Planner by Jenniffer). Objective Learn and implement calming skills to reduce overall anxiety and manage anxiety symptoms. Target Date: 2024-06-01 Frequency: Biweekly  Progress: 70 Modality: individual  Related Interventions Teach the client calming/relaxation skills (e.g., applied relaxation, progressive muscle relaxation, cue controlled relaxation; mindful breathing; biofeedback) and how to discriminate better between relaxation and tension; teach the client how to apply these skills to his/her daily life (e.g., New  Directions in Progressive Muscle Relaxation by Thornell Collier, and Hazlett-Stevens; Treating Generalized Anxiety Disorder by Rygh and Red). Assign the client to read about progressive muscle relaxation and other calming strategies in relevant books or treatment manuals (e.g., Progressive Relaxation Training by Thornell and Collier; Mastery of Your Anxiety and Worry: Workbook by Richarda armin Given). Objective Learn and implement relapse prevention strategies for managing possible future anxiety symptoms. Target Date: 2024-06-01 Frequency: Biweekly  Progress: 40 Modality: individual  Related Interventions Discuss with the client the distinction between a lapse and relapse, associating a lapse with an initial and reversible return of worry, anxiety symptoms, or urges to avoid, and relapse with the decision to continue the fearful and avoidant patterns. Identify and rehearse with the client the management of future situations or circumstances in which lapses could occur. Instruct the client to routinely use new therapeutic skills (e.g., relaxation, cognitive restructuring, exposure, and problem-solving) in daily life to address emergent worries, anxiety, and avoidant tendencies. Develop a coping card on which coping strategies and other important information (e.g.,  Breathe deeply and relax, Challenge unrealistic worries, Use problem-solving) are written for the client's later use.  Diagnosis:Major depressive disorder, recurrent episode, moderate (HCC)  Generalized anxiety disorder  PTSD (post-traumatic stress disorder)  Plan:  -meet again Thursday, November 17, 2023 at 8am in-person

## 2023-11-14 ENCOUNTER — Other Ambulatory Visit: Payer: Self-pay | Admitting: Family

## 2023-11-14 DIAGNOSIS — F419 Anxiety disorder, unspecified: Secondary | ICD-10-CM

## 2023-11-14 NOTE — Telephone Encounter (Signed)
 Requesting: Klonopin  0.5mg   Contract: 05/30/23 UDS: 05/30/23 Last Visit: 10/18/23 Next Visit:  11/30/23 Last Refill: 10/17/23 #90 and 0RF   Please Advise

## 2023-11-17 ENCOUNTER — Ambulatory Visit: Admitting: Professional

## 2023-11-17 ENCOUNTER — Encounter: Payer: Self-pay | Admitting: Professional

## 2023-11-17 DIAGNOSIS — F4310 Post-traumatic stress disorder, unspecified: Secondary | ICD-10-CM

## 2023-11-17 DIAGNOSIS — F331 Major depressive disorder, recurrent, moderate: Secondary | ICD-10-CM

## 2023-11-17 DIAGNOSIS — F411 Generalized anxiety disorder: Secondary | ICD-10-CM | POA: Diagnosis not present

## 2023-11-17 NOTE — Progress Notes (Signed)
 [facility] Counselor/Therapist Progress Note  Patient ID: Shannon [name], MRN: [id number],    Date: 11/17/2023  Time Spent:  51 minutes [id number]  Treatment Type: Individual Therapy  Risk Assessment: Danger to Self:  No Self-injurious Behavior: No Danger to Others: No  Subjective: The patient arrived on time for her in person appointment.   Issues addressed: 1-mood -sad and tearful 2-medication monitoring -medication has helped to reduce chronic crying -pt does wonder if it is the most helpful -discussed [facility] at an opportunity for treatment 3-social -pt isolated -all family are busy and her afternoons are long -discussed potential for involvement in some activities -reengage with friend who recently moved back to [facility] 4-EMDR -discussed as treatment option -pt asked to watch online demonstration -shared how brain is impacted by trauma -pt thinks she would like to try -begin next session  Treatment Plan Problems Addressed  Anxiety, Childhood Trauma, Low Self-Esteem, Unipolar Depression Goals 1. Alleviate depressive symptoms and return to previous level of effective functioning. 2. Appropriately grieve the loss in order to normalize mood and to return to previously adaptive level of functioning. Objective Learn and implement behavioral strategies to overcome depression. Target Date: 2024-06-01 Frequency: Biweekly  Progress: 60 Modality: individual  Related Interventions Engage the client in behavioral activation, increasing his/her activity level and contact with sources of reward, while identifying processes that inhibit activation (see Behavioral Activation for Depression by Loleta, Dimidjian, and Herman-Dunn; or assign Identify and Schedule Pleasant Activities in the Adult Psychotherapy Homework Planner by [facility]); use behavioral techniques such as instruction, rehearsal, role-playing, role reversal, as needed, to facilitate  activity in the client's daily life; reinforce success. Assist the client in developing skills that increase the likelihood of deriving pleasure from behavioral activation (e.g., assertiveness skills, developing an exercise plan, less internal/more external focus, increased social involvement); reinforce success. Objective Learn and implement conflict resolution skills to resolve interpersonal problems. Target Date: 2024-06-01 Frequency: Biweekly  Progress: 70 Modality: individual  Related Interventions Teach conflict resolution skills (e.g., empathy, active listening, I messages, respectful communication, assertiveness without aggression, compromise); use psychoeducation, modeling, role-playing, and rehearsal to work through several current conflicts; assign homework exercises; review and repeat so as to integrate their use into the client's life. Objective Learn and implement relapse prevention skills. Target Date: 2024-06-01 Frequency: Biweekly  Progress: 40 Modality: individual  Related Interventions Identify and rehearse with the client the management of future situations or circumstances in which lapses could occur. Objective Verbalize an understanding of healthy and unhealthy emotions with the intent of increasing the use of healthy emotions to guide actions. Target Date: 2024-06-01 Frequency: Biweekly  Progress: 80 Modality: individual  Related Interventions Use a process-experiential approach consistent with Emotion-Focused Therapy to create a safe, nurturing environment in which the client can process emotions, learning to identify and regulate unhealthy feelings and to generate more adaptive ones that then guide actions (see Emotion-Focused Therapy for Depression by Audrey armin Portugal). 3. Demonstrate improved self-esteem through more pride in appearance, more assertiveness, greater eye contact, and identification of positive traits in self-talk messages. 4. Develop an awareness  of how childhood issues have affected and continue to affect one's family life. 5. Develop healthy interpersonal relationships that lead to the alleviation and help prevent the relapse of depression. 6. Develop healthy thinking patterns and beliefs about self, others, and the world that lead to the alleviation and help prevent the relapse of depression. 7. Elevate self-esteem. Objective Demonstrate an increased ability to identify and express personal  feelings. Target Date: 2024-06-01 Frequency: Biweekly  Progress: 50 Modality: individual  Related Interventions Assist the client in identifying and labeling emotions. Objective Acknowledge feeling less competent than most others. Target Date: 06-01-2024 Frequency: Biweekly  Progress: 70 Modality: individual  Related Interventions Explore the client's assessment of himself/herself and what is verbalized as the basis for negative self-perception. Actively build the level of trust with the client in individual sessions through consistent eye contact, active listening, unconditional positive regard, and warm acceptance to help increase his/her ability to identify and express feelings. Objective Decrease the frequency of negative self-descriptive statements and increase frequency of positive self-descriptive statements. Target Date: 2024-06-01 Frequency: Biweekly  Progress: 40 Modality: individual  Related Interventions Assist the client in becoming aware of how he/she expresses or acts out negative feelings about himself/herself. Help the client reframe his/her negative assessment of himself/herself. Assist the client in developing positive self-talk as a way of boosting his/her confidence and self-image (or assign Positive Self-Talk in the Adult Psychotherapy Homework Planner by Jenniffer). Objective Identify and engage in activities that would improve self-image by being consistent with one's values. Target Date: 2024-06-01 Frequency: Biweekly   Progress: 60 Modality: individual  Related Interventions Help the client analyze his/her values and the congruence or incongruence between them and the client's daily activities. Identify and assign activities congruent with the client's values; process them toward improving self-concept and self-esteem. Objective Identify positive traits and talents about self. Target Date: 2024-06-01 Frequency: Biweekly  Progress: 40 Modality: individual  Related Interventions Assign the client the exercise of identifying his/her positive physical characteristics in a mirror to help him/her become more comfortable with himself/herself. Ask the client to keep building a list of positive traits and have him/her read the list at the beginning and end of each session (or assign Acknowledging My Strengths or What Are My Good Qualities? in the Adult Psychotherapy Homework Planner by [facility]); reinforce the client's positive self-descriptive statements. 8. Enhance ability to effectively cope with the full variety of life's worries and anxieties. 9. Establish an inward sense of self-worth, confidence, and competence. 10. Interact socially without undue distress or disability. 11. Learn and implement coping skills that result in a reduction of anxiety and worry, and improved daily functioning. 12. Let go of blame and begin to forgive others for pain caused in childhood. Objective Increase level of trust of others as shown by more socialization and greater intimacy tolerance. Target Date: 2024-06-01 Frequency: Biweekly  Progress: 60 Modality: individual  Related Interventions Teach the client the share-check method of building trust in relationships (sharing a little information and checking as to the recipient's sensitivity in reacting to that information). Teach the client the advantages of treating people as trustworthy given a reasonable amount of time to assess their character. Objective Describe each  family member and identify the role each played within the family. Target Date: 2024-06-01 Frequency: Biweekly  Progress: 80 Modality: individual  Related Interventions Assist the client in clarifying his/her role within the family and his/her feelings connected to that role. Objective Identify feelings associated with major traumatic incidents in childhood and with parental child-rearing patterns. Target Date: 2024-06-01 Frequency: Biweekly  Progress: 70 Modality: individual  Related Interventions Support and encourage the client when he/she begins to express feelings of rage, sadness, fear, and rejection relating to family abuse or neglect. Assign the client to record feelings in a journal that describes memories, behavior, and emotions tied to his/her traumatic childhood experiences (or assign How the Trauma Affects Me in  the Adult Psychotherapy Homework Planner by Jenniffer). Ask the client to read books on the emotional effects of neglect and abuse in childhood (e.g., It Will Never Happen to Me by [facility]; Outgrowing the Pain by Tory; Healing the Child Within by [facility]); process insights attained. 13. Reduce overall frequency, intensity, and duration of the anxiety so that daily functioning is not impaired. Objective Reestablish a consistent sleep-wake cycle. Target Date: 2024-06-01 Frequency: Biweekly  Progress: 50 Modality: individual  Related Interventions Teach and implement sleep hygiene practices to help the client reestablish a consistent sleep-wake cycle; review, reinforce success, and provide corrective feedback toward improvement. Objective Identify, challenge, and replace biased, fearful self-talk with positive, realistic, and empowering self-talk. Target Date: 2024-06-01 Frequency: Biweekly  Progress: 40 Modality: individual  Related Interventions Explore the client's schema and self-talk that mediate his/her fear response; assist him/her in challenging the biases; replace  the distorted messages with reality-based alternatives and positive, realistic self-talk that will increase his/her self-confidence in coping with irrational fears (see Cognitive Therapy of Anxiety Disorders by Gretta armin Mon). Assign the client a homework exercise in which he/she identifies fearful self-talk, identifies biases in the self-talk, generates alternatives, and tests through behavioral experiments (or assign Negative Thoughts Trigger Negative Feelings in the Adult Psychotherapy Homework Planner by [facility]); review and reinforce success, providing corrective feedback toward improvement. Objective Learn and implement problem-solving strategies for realistically addressing worries. Target Date: 2024-06-01 Frequency: Biweekly  Progress: 60 Modality: individual  Related Interventions Teach the client problem-solving strategies involving specifically defining a problem, generating options for addressing it, evaluating the pros and cons of each option, selecting and implementing an optional action, and reevaluating and refining the action (or assign Applying Problem-Solving to Interpersonal Conflict in the Adult Psychotherapy Homework Planner by Jenniffer). Objective Learn and implement calming skills to reduce overall anxiety and manage anxiety symptoms. Target Date: 2024-06-01 Frequency: Biweekly  Progress: 70 Modality: individual  Related Interventions Teach the client calming/relaxation skills (e.g., applied relaxation, progressive muscle relaxation, cue controlled relaxation; mindful breathing; biofeedback) and how to discriminate better between relaxation and tension; teach the client how to apply these skills to his/her daily life (e.g., New Directions in Progressive Muscle Relaxation by Thornell Collier, and Hazlett-Stevens; Treating Generalized Anxiety Disorder by Rygh and Red). Assign the client to read about progressive muscle relaxation and other calming strategies in  relevant books or treatment manuals (e.g., Progressive Relaxation Training by Thornell and Collier; Mastery of Your Anxiety and Worry: Workbook by Richarda armin Given). Objective Learn and implement relapse prevention strategies for managing possible future anxiety symptoms. Target Date: 2024-06-01 Frequency: Biweekly  Progress: 40 Modality: individual  Related Interventions Discuss with the client the distinction between a lapse and relapse, associating a lapse with an initial and reversible return of worry, anxiety symptoms, or urges to avoid, and relapse with the decision to continue the fearful and avoidant patterns. Identify and rehearse with the client the management of future situations or circumstances in which lapses could occur. Instruct the client to routinely use new therapeutic skills (e.g., relaxation, cognitive restructuring, exposure, and problem-solving) in daily life to address emergent worries, anxiety, and avoidant tendencies. Develop a coping card on which coping strategies and other important information (e.g., Breathe deeply and relax, Challenge unrealistic worries, Use problem-solving) are written for the client's later use.  Diagnosis:Major depressive disorder, recurrent episode, moderate (HCC)  Generalized anxiety disorder  PTSD (post-traumatic stress disorder)  Plan:  -meet again Thursday, December 01, 2023 at 8am in-person

## 2023-11-18 ENCOUNTER — Ambulatory Visit: Payer: BC Managed Care – PPO | Admitting: Neurology

## 2023-11-18 ENCOUNTER — Encounter: Payer: Self-pay | Admitting: Neurology

## 2023-11-18 VITALS — BP 115/78 | HR 70 | Ht 66.0 in

## 2023-11-18 DIAGNOSIS — R413 Other amnesia: Secondary | ICD-10-CM

## 2023-11-18 NOTE — Patient Instructions (Signed)
 Good to see you again!  Schedule Neurocognitive testing for October 2025  2. Follow-up after testing, call for any changes   RECOMMENDATIONS FOR ALL PATIENTS WITH MEMORY PROBLEMS: 1. Continue to exercise (Recommend 30 minutes of walking everyday, or 3 hours every week) 2. Increase social interactions - continue going to [place] and enjoy social gatherings with friends and family 3. Eat healthy, avoid fried foods and eat more fruits and vegetables 4. Maintain adequate blood pressure, blood sugar, and blood cholesterol level. Reducing the risk of stroke and cardiovascular disease also helps promoting better memory. 5. Avoid stressful situations. Live a simple life and avoid aggravations. Organize your time and prepare for the next day in anticipation. 6. Sleep well, avoid any interruptions of sleep and avoid any distractions in the bedroom that may interfere with adequate sleep quality 7. Avoid sugar, avoid sweets as there is a strong link between excessive sugar intake, diabetes, and cognitive impairment We discussed the Mediterranean diet, which has been shown to help patients reduce the risk of progressive memory disorders and reduces cardiovascular risk. This includes eating fish, eat fruits and green leafy vegetables, nuts like almonds and hazelnuts, walnuts, and also use olive oil. Avoid fast foods and fried foods as much as possible. Avoid sweets and sugar as sugar use has been linked to worsening of memory function.

## 2023-11-18 NOTE — Progress Notes (Signed)
 NEUROLOGY FOLLOW UP OFFICE NOTE  Shannon [name] [id number] March 12, 1961  HISTORY OF PRESENT ILLNESS: I had the pleasure of seeing Shannon [name] in follow-up in the neurology clinic on 11/18/2023. She is alone in the office today. The patient was last seen in October 2023. She was initially seen for memory loss with normal Neuropsychological evaluation done Dec 15, 2020. On her last visit, she reported back pain radiating down the leg. Pain and leg symptoms improved after back surgery. Recently she has been having numbness and tingling in both feet. No bowel/bladder dysfunction. No falls. She presents today for memory evaluation. Her mother passed away December 16, 2022. There is much less stress, her family has seen improvements with her, reporting she is not repeating herself as much and not asking the same question every hour. She does not feel as scatterbrained. She denies getting lost driving. There was only one incident a month ago where her husband was a little concerned, she did not realize she left the car on after she parked. It is a Financial planner car, she is not sure if she pushed the button lightly so it did not turn off. This occurred on a week she was distracted dealing with her parents' estate. She denies missing medications or bill payments. She continues to Oncologist.   She denies any headaches, dizziness, vision changes, focal symptoms. She continues to deal with anxiety and depression (may be a little worse) and sees a psychologist regularly. She takes clonazepam  TID. Sleeps is getting better, she gets 6-7 hours of sleep. She rarely drinks alcohol.   History On Initial Assessment 10/01/2020: This is a pleasant 63 year old right-handed woman with a history of remote migraines, back surgery, anxiety, depression, presenting for evaluation of memory loss. She feels her memory is sometimes okay, other times not okay. She lives with her husband, family have sometimes made comments that  she just asked the same question or they had just told her this. She denies getting lost driving. She denies missing medications or bill payments. She denies misplacing things frequently or leaving the stove on. She states there is a lot going on, her mother has symptoms suggestive of dementia and refuses care, and because of family history, she is concerned for herself. She cares for both parents who need help, seeing them daily. She has a lot of panic attacks and takes clonazepam  2-3 times a day. She has mild headaches that she attributes to stress. She has occasional swallowing difficulties that she feels is anxiety-related. She has intermittent tingling in both feet and low back pain. No dizziness, diplopia, dysarthria, neck pain, bowel/bladder dysfunction, anosmia, or tremors. Aside from her mother, her father is also having memory changes since a stroke. She has had at least 3 concussions with loss of consciousness, most recently 62 years ago. She drinks alcohol socially. She is very active, exercising regularly, teaches swimming. She states she has held it together through all this.   Diagnostic Data: Neuropsychological evaluation December 15, 2020 indicated a diagnosis of subjective cognitive dysfunction, most likely culprit being moderate to severe psychiatric distress.   Laboratory Data: Lab Results  Component Value Date   TSH 1.12 10/01/2020   Lab Results  Component Value Date   VITAMINB12 308 10/01/2020    PAST MEDICAL HISTORY: Past Medical History:  Diagnosis Date   Anemia    Atypical chest pain 03/08/2012   Bilateral thumb pain 01/12/2018   Bilateral wrist pain 01/12/2018   Cervical spondylosis without myelopathy 02/12/2016   Chronic  midline posterior neck pain 06/19/2015   Concussion without loss of consciousness 06/18/2017   Constipation 01/14/2021   Ecchymosis 05/25/2011   Family history of colonic polyps 12/26/2017   Generalized anxiety disorder with panic attacks 12/30/2010    Grief reaction 02/03/2022   History of eating disorder    anorexia nervosa in her Jan 04, 2024   Kidney stones    passed on their own   Laceration of left thumb 01/14/2021   Low back pain 03/03/2020   Lumbar radicular pain 06/19/2015   Major depressive disorder 12/23/2012   Mild neurocognitive disorder due to another medical condition 10/20/2017   Mixed obsessional thoughts and acts 03/09/2017   Osteoarthritis of carpometacarpal (CMC) joint of thumb 01/10/2020   Postlaminectomy syndrome, lumbar region 06/19/2015   Preventative health care 02/15/2012   Raynaud's disease 03/23/2013   Recurrent major depressive disorder, in partial remission (HCC) 03/09/2017   Sacroiliac dysfunction 06/19/2015   Spinal stenosis of lumbar region with radiculopathy 04/15/2022   Spondylosis of lumbar region without myelopathy or radiculopathy 07/15/2015   Stomach disorder    due to complications with cholecystectomy almost died    MEDICATIONS: Current Outpatient Medications on File Prior to Visit  Medication Sig Dispense Refill   buPROPion  (WELLBUTRIN  XL) 150 MG 24 hr tablet Take 2 tablets (300 mg total) by mouth daily.     busPIRone  (BUSPAR ) 15 MG tablet Take 1 tablet (15 mg total) by mouth 2 (two) times daily. 180 tablet 1   KLONOPIN  0.5 MG tablet TAKE 1 TABLET BY MOUTH 3 TIMES A DAY AS NEEDED FOR ANXIETY 90 tablet 0   lidocaine  (LIDODERM ) 5 % Place 1 patch onto the skin daily as needed (pain). Remove & Discard patch within 12 hours or as directed by MD     LINZESS  290 MCG CAPS capsule Take 290 mcg by mouth daily.     MAGNESIUM  GLYCINATE PO Take 120 mg by mouth at bedtime.     OVER THE COUNTER MEDICATION Take 1 capsule by mouth daily. Brain Elevate     polyethylene glycol (MIRALAX  / GLYCOLAX ) 17 g packet Take 17 g by mouth daily. 14 each 0   melatonin 3 MG TABS tablet Take 3 mg by mouth at bedtime.     Turmeric (QC TUMERIC COMPLEX PO) Take by mouth.     No current facility-administered medications on file  prior to visit.    ALLERGIES: Allergies  Allergen Reactions   Buprenorphine Hcl     No relief   Cortisporin [Neomycin-Polymyxin-Hc] Swelling    Ear dropps   Eszopiclone     Headaches     Flexeril [Cyclobenzaprine Hcl] Other (See Comments)    Mood swings   Gabapentin     sedation   Morphine And Codeine Other (See Comments)    No relief   Nsaids Other (See Comments)    GI bleed   Paroxetine  Nausea Only        Shellfish Allergy Other (See Comments)    skin & mouth tingle   Tizanidine Hcl Other (See Comments)    Dizziness, mood swings   Tolmetin     GI bleed    FAMILY HISTORY: Family History  Problem Relation Age of Onset   Hypertension Mother    Memory loss Mother    Dementia Mother    Alcoholism Mother    Diabetes Father    Hypertension Father    Hypothyroidism Father    CAD Father    Stroke Father    Adrenal disorder  Father    Adrenal disorder Sister    Hashimoto's thyroiditis Sister    Hypothyroidism Sister    Raynaud syndrome Sister    Neuropathy Sister        chronic inflammatory demyelinating peripheral neuropathy from lyme disease   Alcohol abuse Maternal Grandmother    Cancer Paternal Grandfather        lung cancer   Hypothyroidism Other     SOCIAL HISTORY: Social History   Socioeconomic History   Marital status: Married    Spouse name: Not on file   Number of children: 3   Years of education: 15   Highest education level: Associate degree: academic program  Occupational History    Employer: SELF EMPLOYED  Tobacco Use   Smoking status: Never   Smokeless tobacco: Never  Vaping Use   Vaping status: Never Used  Substance and Sexual Activity   Alcohol use: Yes    Comment: rare   Drug use: Not Currently   Sexual activity: Yes    Partners: Male    Birth control/protection: Surgical  Other Topics Concern   Not on file  Social History Narrative   Regular exercise:  Yes (swim instructor)   Caffeine Use:  1 daily   Married 3 children  ages 27 son, 66 son, and 74 daughter   Associates degree   Right handed          Social Drivers of Corporate investment banker Strain: Not on file  Food Insecurity: Not on file  Transportation Needs: Not on file  Physical Activity: Not on file  Stress: Not on file  Social Connections: Not on file  Intimate Partner Violence: Not on file     PHYSICAL EXAM: Vitals:   11/18/23 0803  BP: 115/78  Pulse: 70  SpO2: 100%   General: No acute distress Head:  Normocephalic/atraumatic Skin/Extremities: No rash, no edema Neurological Exam: alert and awake. No aphasia or dysarthria. Fund of knowledge is appropriate.  Attention and concentration are normal.   Cranial nerves: Pupils equal, round. Extraocular movements intact with no nystagmus. Visual fields full.  No facial asymmetry.  Motor: Bulk and tone normal, muscle strength 5/5 throughout with no pronator drift.   Finger to nose testing intact.  Gait narrow-based and steady, able to tandem walk adequately.  Romberg negative.   IMPRESSION: This is a pleasant 63 yo RH woman with memory concerns. Neuropsychological evaluation in 2022 did not show any evidence of a neurodegenerative condition, subjective memory concerns likely due to severe anxiety and depression. She reports less overall stress but continues to express concern about cognition. She asks about the available blood tests for AD, we discussed that the test itself does not diagnose Alzheimer's disease, the blood test alone does not mean a person has Alzheimer's disease. We discussed doing repeat Neurocognitive testing to assess trajectory, as well as focusing on healthy lifestyle habits, control of vascular risk factors, as more important in reducing risk for dementia. Follow-up after testing, call for any changes.    Thank you for allowing me to participate in her care.  Please do not hesitate to call for any questions or concerns.    Darice Shivers, M.D.   CC: Eleanor Ponto, NP

## 2023-11-28 ENCOUNTER — Ambulatory Visit: Payer: Self-pay | Admitting: Family

## 2023-11-28 ENCOUNTER — Ambulatory Visit (HOSPITAL_BASED_OUTPATIENT_CLINIC_OR_DEPARTMENT_OTHER)
Admission: RE | Admit: 2023-11-28 | Discharge: 2023-11-28 | Disposition: A | Discharge status: Home / Self Care | Source: Ambulatory Visit | Attending: Family | Admitting: Family

## 2023-11-28 DIAGNOSIS — Z780 Asymptomatic menopausal state: Secondary | ICD-10-CM | POA: Insufficient documentation

## 2023-11-28 DIAGNOSIS — M85852 Other specified disorders of bone density and structure, left thigh: Secondary | ICD-10-CM | POA: Diagnosis not present

## 2023-11-29 DIAGNOSIS — M546 Pain in thoracic spine: Secondary | ICD-10-CM | POA: Diagnosis not present

## 2023-11-29 DIAGNOSIS — M542 Cervicalgia: Secondary | ICD-10-CM | POA: Diagnosis not present

## 2023-11-30 ENCOUNTER — Other Ambulatory Visit: Payer: Self-pay | Admitting: Family

## 2023-11-30 ENCOUNTER — Ambulatory Visit: Admitting: Family

## 2023-12-01 ENCOUNTER — Ambulatory Visit: Admitting: Professional

## 2023-12-04 ENCOUNTER — Other Ambulatory Visit: Payer: Self-pay | Admitting: Family

## 2023-12-14 ENCOUNTER — Encounter: Payer: Self-pay | Admitting: Family

## 2023-12-14 ENCOUNTER — Other Ambulatory Visit: Payer: Self-pay | Admitting: Family

## 2023-12-14 DIAGNOSIS — F419 Anxiety disorder, unspecified: Secondary | ICD-10-CM

## 2023-12-15 ENCOUNTER — Other Ambulatory Visit: Payer: Self-pay | Admitting: Family

## 2023-12-15 NOTE — Telephone Encounter (Signed)
 Rx for Wellbutrin  XL 150mg  denied. Dose changed to 300mg  and rx was sent on 11/30/23.

## 2023-12-16 DIAGNOSIS — K5904 Chronic idiopathic constipation: Secondary | ICD-10-CM | POA: Diagnosis not present

## 2023-12-20 DIAGNOSIS — M545 Low back pain, unspecified: Secondary | ICD-10-CM | POA: Diagnosis not present

## 2023-12-20 DIAGNOSIS — M546 Pain in thoracic spine: Secondary | ICD-10-CM | POA: Diagnosis not present

## 2023-12-20 DIAGNOSIS — M542 Cervicalgia: Secondary | ICD-10-CM | POA: Diagnosis not present

## 2023-12-21 DIAGNOSIS — M25552 Pain in left hip: Secondary | ICD-10-CM | POA: Diagnosis not present

## 2023-12-22 ENCOUNTER — Encounter: Payer: Self-pay | Admitting: Professional

## 2023-12-22 ENCOUNTER — Ambulatory Visit: Admitting: Professional

## 2023-12-22 DIAGNOSIS — F4310 Post-traumatic stress disorder, unspecified: Secondary | ICD-10-CM | POA: Diagnosis not present

## 2023-12-22 DIAGNOSIS — F411 Generalized anxiety disorder: Secondary | ICD-10-CM

## 2023-12-22 DIAGNOSIS — F331 Major depressive disorder, recurrent, moderate: Secondary | ICD-10-CM

## 2023-12-22 NOTE — Progress Notes (Signed)
 [facility] Counselor/Therapist Progress Note  Patient ID: Lavonia Eager, MRN: [id number],    Date: 12/22/2023  Time Spent:  50 minutes [id number]  Treatment Type: Individual Therapy  Risk Assessment: Danger to Self:  No Self-injurious Behavior: No Danger to Others: No  Subjective: The patient arrived on time for her in person appointment.   Issues addressed: 1-mood -sad and tearful 2-physical -pt and husband got very sick 3-mother's death anniversary, one year, was on the 13th -pt did not have any issues 4-father -tearful at the mere thought -watches video of his parents home and of the events that unfolded from the day of his death 2-children a-[name] -fiance's mother died and pt has not spoken to in nearly a month  3-social -pt isolated -all family are busy and her afternoons are long -discussed potential for involvement in some activities -reengage with friend who recently moved back to [place] 4- 5-EMDR -pt did not watch demo video and will do so prior to next session -discussed difference in biofeedback vs EMDR  Treatment Plan Problems Addressed  Anxiety, Childhood Trauma, Low Self-Esteem, Unipolar Depression Goals 1. Alleviate depressive symptoms and return to previous level of effective functioning. 2. Appropriately grieve the loss in order to normalize mood and to return to previously adaptive level of functioning. Objective Learn and implement behavioral strategies to overcome depression. Target Date: 2024-06-01 Frequency: Biweekly  Progress: 60 Modality: individual  Related Interventions Engage the client in behavioral activation, increasing his/her activity level and contact with sources of reward, while identifying processes that inhibit activation (see Behavioral Activation for Depression by Loleta, Dimidjian, and Herman-Dunn; or assign Identify and Schedule Pleasant Activities in the Adult Psychotherapy Homework Planner by [facility]);  use behavioral techniques such as instruction, rehearsal, role-playing, role reversal, as needed, to facilitate activity in the client's daily life; reinforce success. Assist the client in developing skills that increase the likelihood of deriving pleasure from behavioral activation (e.g., assertiveness skills, developing an exercise plan, less internal/more external focus, increased social involvement); reinforce success. Objective Learn and implement conflict resolution skills to resolve interpersonal problems. Target Date: 2024-06-01 Frequency: Biweekly  Progress: 70 Modality: individual  Related Interventions Teach conflict resolution skills (e.g., empathy, active listening, I messages, respectful communication, assertiveness without aggression, compromise); use psychoeducation, modeling, role-playing, and rehearsal to work through several current conflicts; assign homework exercises; review and repeat so as to integrate their use into the client's life. Objective Learn and implement relapse prevention skills. Target Date: 2024-06-01 Frequency: Biweekly  Progress: 40 Modality: individual  Related Interventions Identify and rehearse with the client the management of future situations or circumstances in which lapses could occur. Objective Verbalize an understanding of healthy and unhealthy emotions with the intent of increasing the use of healthy emotions to guide actions. Target Date: 2024-06-01 Frequency: Biweekly  Progress: 80 Modality: individual  Related Interventions Use a process-experiential approach consistent with Emotion-Focused Therapy to create a safe, nurturing environment in which the client can process emotions, learning to identify and regulate unhealthy feelings and to generate more adaptive ones that then guide actions (see Emotion-Focused Therapy for Depression by Audrey armin Portugal). 3. Demonstrate improved self-esteem through more pride in appearance, more  assertiveness, greater eye contact, and identification of positive traits in self-talk messages. 4. Develop an awareness of how childhood issues have affected and continue to affect one's family life. 5. Develop healthy interpersonal relationships that lead to the alleviation and help prevent the relapse of depression. 6. Develop healthy thinking patterns and beliefs about self,  others, and the world that lead to the alleviation and help prevent the relapse of depression. 7. Elevate self-esteem. Objective Demonstrate an increased ability to identify and express personal feelings. Target Date: 2024-06-01 Frequency: Biweekly  Progress: 50 Modality: individual  Related Interventions Assist the client in identifying and labeling emotions. Objective Acknowledge feeling less competent than most others. Target Date: 06-01-2024 Frequency: Biweekly  Progress: 70 Modality: individual  Related Interventions Explore the client's assessment of himself/herself and what is verbalized as the basis for negative self-perception. Actively build the level of trust with the client in individual sessions through consistent eye contact, active listening, unconditional positive regard, and warm acceptance to help increase his/her ability to identify and express feelings. Objective Decrease the frequency of negative self-descriptive statements and increase frequency of positive self-descriptive statements. Target Date: 2024-06-01 Frequency: Biweekly  Progress: 40 Modality: individual  Related Interventions Assist the client in becoming aware of how he/she expresses or acts out negative feelings about himself/herself. Help the client reframe his/her negative assessment of himself/herself. Assist the client in developing positive self-talk as a way of boosting his/her confidence and self-image (or assign Positive Self-Talk in the Adult Psychotherapy Homework Planner by Jenniffer). Objective Identify and engage in  activities that would improve self-image by being consistent with one's values. Target Date: 2024-06-01 Frequency: Biweekly  Progress: 60 Modality: individual  Related Interventions Help the client analyze his/her values and the congruence or incongruence between them and the client's daily activities. Identify and assign activities congruent with the client's values; process them toward improving self-concept and self-esteem. Objective Identify positive traits and talents about self. Target Date: 2024-06-01 Frequency: Biweekly  Progress: 40 Modality: individual  Related Interventions Assign the client the exercise of identifying his/her positive physical characteristics in a mirror to help him/her become more comfortable with himself/herself. Ask the client to keep building a list of positive traits and have him/her read the list at the beginning and end of each session (or assign Acknowledging My Strengths or What Are My Good Qualities? in the Adult Psychotherapy Homework Planner by [facility]); reinforce the client's positive self-descriptive statements. 8. Enhance ability to effectively cope with the full variety of life's worries and anxieties. 9. Establish an inward sense of self-worth, confidence, and competence. 10. Interact socially without undue distress or disability. 11. Learn and implement coping skills that result in a reduction of anxiety and worry, and improved daily functioning. 12. Let go of blame and begin to forgive others for pain caused in childhood. Objective Increase level of trust of others as shown by more socialization and greater intimacy tolerance. Target Date: 2024-06-01 Frequency: Biweekly  Progress: 60 Modality: individual  Related Interventions Teach the client the share-check method of building trust in relationships (sharing a little information and checking as to the recipient's sensitivity in reacting to that information). Teach the client the advantages  of treating people as trustworthy given a reasonable amount of time to assess their character. Objective Describe each family member and identify the role each played within the family. Target Date: 2024-06-01 Frequency: Biweekly  Progress: 80 Modality: individual  Related Interventions Assist the client in clarifying his/her role within the family and his/her feelings connected to that role. Objective Identify feelings associated with major traumatic incidents in childhood and with parental child-rearing patterns. Target Date: 2024-06-01 Frequency: Biweekly  Progress: 70 Modality: individual  Related Interventions Support and encourage the client when he/she begins to express feelings of rage, sadness, fear, and rejection relating to family abuse or neglect.  Assign the client to record feelings in a journal that describes memories, behavior, and emotions tied to his/her traumatic childhood experiences (or assign How the Trauma Affects Me in the Adult Psychotherapy Homework Planner by Jenniffer). Ask the client to read books on the emotional effects of neglect and abuse in childhood (e.g., It Will Never Happen to Me by [facility]; Outgrowing the Pain by Tory; Healing the Child Within by [facility]); process insights attained. 13. Reduce overall frequency, intensity, and duration of the anxiety so that daily functioning is not impaired. Objective Reestablish a consistent sleep-wake cycle. Target Date: 2024-06-01 Frequency: Biweekly  Progress: 50 Modality: individual  Related Interventions Teach and implement sleep hygiene practices to help the client reestablish a consistent sleep-wake cycle; review, reinforce success, and provide corrective feedback toward improvement. Objective Identify, challenge, and replace biased, fearful self-talk with positive, realistic, and empowering self-talk. Target Date: 2024-06-01 Frequency: Biweekly  Progress: 40 Modality: individual  Related Interventions Explore  the client's schema and self-talk that mediate his/her fear response; assist him/her in challenging the biases; replace the distorted messages with reality-based alternatives and positive, realistic self-talk that will increase his/her self-confidence in coping with irrational fears (see Cognitive Therapy of Anxiety Disorders by Gretta armin Mon). Assign the client a homework exercise in which he/she identifies fearful self-talk, identifies biases in the self-talk, generates alternatives, and tests through behavioral experiments (or assign Negative Thoughts Trigger Negative Feelings in the Adult Psychotherapy Homework Planner by [facility]); review and reinforce success, providing corrective feedback toward improvement. Objective Learn and implement problem-solving strategies for realistically addressing worries. Target Date: 2024-06-01 Frequency: Biweekly  Progress: 60 Modality: individual  Related Interventions Teach the client problem-solving strategies involving specifically defining a problem, generating options for addressing it, evaluating the pros and cons of each option, selecting and implementing an optional action, and reevaluating and refining the action (or assign Applying Problem-Solving to Interpersonal Conflict in the Adult Psychotherapy Homework Planner by Jenniffer). Objective Learn and implement calming skills to reduce overall anxiety and manage anxiety symptoms. Target Date: 2024-06-01 Frequency: Biweekly  Progress: 70 Modality: individual  Related Interventions Teach the client calming/relaxation skills (e.g., applied relaxation, progressive muscle relaxation, cue controlled relaxation; mindful breathing; biofeedback) and how to discriminate better between relaxation and tension; teach the client how to apply these skills to his/her daily life (e.g., New Directions in Progressive Muscle Relaxation by Thornell Collier, and Hazlett-Stevens; Treating Generalized Anxiety Disorder by  Rygh and Red). Assign the client to read about progressive muscle relaxation and other calming strategies in relevant books or treatment manuals (e.g., Progressive Relaxation Training by Thornell and Collier; Mastery of Your Anxiety and Worry: Workbook by Richarda armin Given). Objective Learn and implement relapse prevention strategies for managing possible future anxiety symptoms. Target Date: 2024-06-01 Frequency: Biweekly  Progress: 40 Modality: individual  Related Interventions Discuss with the client the distinction between a lapse and relapse, associating a lapse with an initial and reversible return of worry, anxiety symptoms, or urges to avoid, and relapse with the decision to continue the fearful and avoidant patterns. Identify and rehearse with the client the management of future situations or circumstances in which lapses could occur. Instruct the client to routinely use new therapeutic skills (e.g., relaxation, cognitive restructuring, exposure, and problem-solving) in daily life to address emergent worries, anxiety, and avoidant tendencies. Develop a coping card on which coping strategies and other important information (e.g., Breathe deeply and relax, Challenge unrealistic worries, Use problem-solving) are written for the client's later use.  Diagnosis:Major depressive disorder,  recurrent episode, moderate (HCC)  Generalized anxiety disorder  PTSD (post-traumatic stress disorder)  Plan:  -meet again Thursday, December 01, 2023 at 8am in-person

## 2024-01-05 ENCOUNTER — Encounter: Payer: Self-pay | Admitting: Professional

## 2024-01-05 ENCOUNTER — Ambulatory Visit: Payer: Self-pay | Admitting: Professional

## 2024-01-05 DIAGNOSIS — F4310 Post-traumatic stress disorder, unspecified: Secondary | ICD-10-CM | POA: Diagnosis not present

## 2024-01-05 DIAGNOSIS — F331 Major depressive disorder, recurrent, moderate: Secondary | ICD-10-CM

## 2024-01-05 DIAGNOSIS — F411 Generalized anxiety disorder: Secondary | ICD-10-CM | POA: Diagnosis not present

## 2024-01-05 NOTE — Progress Notes (Signed)
 [facility] Counselor/Therapist Progress Note  Patient ID: Shannon [name], MRN: [id number],    Date: 01/05/2024  Time Spent:  51 minutes [id number]  Treatment Type: Individual Therapy  Risk Assessment: Danger to Self:  No Self-injurious Behavior: No Danger to Others: No  Subjective: The patient arrived on time for her in person appointment.   Issues addressed: 1-mood -neutral 2-daughter Tawni -has finally spent some time with her daughter after six weeks -pt notices and spouse Franky  confirms that pt is condescending toward mother -pt reports noticeable for past 2-3 years -discussed potential for Franky to address with daughter since he identified -pt spending week with Tawni at [employer] and is looking forward 5-EMDR -pt did not watch demo video and will do so prior to next session -reviewed how brain reacts to trauma -watched Childhood Trauma and the  Brain -educated on functions of parts of brain and how trauma impacts. -completed Safe Space; identified trust, alone, private, and information safe as what makes her space safe; tapped in -pt admittedly struggled to push the icky feeling away when she noticed that the tapping helped -walked through grounding exercises to include 5-4-3-2-1, acupressure breathing, 3-5 belling breathing, and eye roll techniques  Treatment Plan Problems Addressed  Anxiety, Childhood Trauma, Low Self-Esteem, Unipolar Depression Goals 1. Alleviate depressive symptoms and return to previous level of effective functioning. 2. Appropriately grieve the loss in order to normalize mood and to return to previously adaptive level of functioning. Objective Learn and implement behavioral strategies to overcome depression. Target Date: 2024-06-01 Frequency: Biweekly  Progress: 60 Modality: individual  Related Interventions Engage the client in behavioral activation, increasing his/her activity level and contact with  sources of reward, while identifying processes that inhibit activation (see Behavioral Activation for Depression by Loleta, Dimidjian, and Herman-Dunn; or assign Identify and Schedule Pleasant Activities in the Adult Psychotherapy Homework Planner by [facility]); use behavioral techniques such as instruction, rehearsal, role-playing, role reversal, as needed, to facilitate activity in the client's daily life; reinforce success. Assist the client in developing skills that increase the likelihood of deriving pleasure from behavioral activation (e.g., assertiveness skills, developing an exercise plan, less internal/more external focus, increased social involvement); reinforce success. Objective Learn and implement conflict resolution skills to resolve interpersonal problems. Target Date: 2024-06-01 Frequency: Biweekly  Progress: 70 Modality: individual  Related Interventions Teach conflict resolution skills (e.g., empathy, active listening, I messages, respectful communication, assertiveness without aggression, compromise); use psychoeducation, modeling, role-playing, and rehearsal to work through several current conflicts; assign homework exercises; review and repeat so as to integrate their use into the client's life. Objective Learn and implement relapse prevention skills. Target Date: 2024-06-01 Frequency: Biweekly  Progress: 40 Modality: individual  Related Interventions Identify and rehearse with the client the management of future situations or circumstances in which lapses could occur. Objective Verbalize an understanding of healthy and unhealthy emotions with the intent of increasing the use of healthy emotions to guide actions. Target Date: 2024-06-01 Frequency: Biweekly  Progress: 80 Modality: individual  Related Interventions Use a process-experiential approach consistent with Emotion-Focused Therapy to create a safe, nurturing environment in which the client can process emotions,  learning to identify and regulate unhealthy feelings and to generate more adaptive ones that then guide actions (see Emotion-Focused Therapy for Depression by Audrey armin Portugal). 3. Demonstrate improved self-esteem through more pride in appearance, more assertiveness, greater eye contact, and identification of positive traits in self-talk messages. 4. Develop an awareness of how childhood issues have affected and  continue to affect one's family life. 5. Develop healthy interpersonal relationships that lead to the alleviation and help prevent the relapse of depression. 6. Develop healthy thinking patterns and beliefs about self, others, and the world that lead to the alleviation and help prevent the relapse of depression. 7. Elevate self-esteem. Objective Demonstrate an increased ability to identify and express personal feelings. Target Date: 2024-06-01 Frequency: Biweekly  Progress: 50 Modality: individual  Related Interventions Assist the client in identifying and labeling emotions. Objective Acknowledge feeling less competent than most others. Target Date: 06-01-2024 Frequency: Biweekly  Progress: 70 Modality: individual  Related Interventions Explore the client's assessment of himself/herself and what is verbalized as the basis for negative self-perception. Actively build the level of trust with the client in individual sessions through consistent eye contact, active listening, unconditional positive regard, and warm acceptance to help increase his/her ability to identify and express feelings. Objective Decrease the frequency of negative self-descriptive statements and increase frequency of positive self-descriptive statements. Target Date: 2024-06-01 Frequency: Biweekly  Progress: 40 Modality: individual  Related Interventions Assist the client in becoming aware of how he/she expresses or acts out negative feelings about himself/herself. Help the client reframe his/her negative  assessment of himself/herself. Assist the client in developing positive self-talk as a way of boosting his/her confidence and self-image (or assign Positive Self-Talk in the Adult Psychotherapy Homework Planner by Jenniffer). Objective Identify and engage in activities that would improve self-image by being consistent with one's values. Target Date: 2024-06-01 Frequency: Biweekly  Progress: 60 Modality: individual  Related Interventions Help the client analyze his/her values and the congruence or incongruence between them and the client's daily activities. Identify and assign activities congruent with the client's values; process them toward improving self-concept and self-esteem. Objective Identify positive traits and talents about self. Target Date: 2024-06-01 Frequency: Biweekly  Progress: 40 Modality: individual  Related Interventions Assign the client the exercise of identifying his/her positive physical characteristics in a mirror to help him/her become more comfortable with himself/herself. Ask the client to keep building a list of positive traits and have him/her read the list at the beginning and end of each session (or assign Acknowledging My Strengths or What Are My Good Qualities? in the Adult Psychotherapy Homework Planner by [facility]); reinforce the client's positive self-descriptive statements. 8. Enhance ability to effectively cope with the full variety of life's worries and anxieties. 9. Establish an inward sense of self-worth, confidence, and competence. 10. Interact socially without undue distress or disability. 11. Learn and implement coping skills that result in a reduction of anxiety and worry, and improved daily functioning. 12. Let go of blame and begin to forgive others for pain caused in childhood. Objective Increase level of trust of others as shown by more socialization and greater intimacy tolerance. Target Date: 2024-06-01 Frequency: Biweekly  Progress: 60  Modality: individual  Related Interventions Teach the client the share-check method of building trust in relationships (sharing a little information and checking as to the recipient's sensitivity in reacting to that information). Teach the client the advantages of treating people as trustworthy given a reasonable amount of time to assess their character. Objective Describe each family member and identify the role each played within the family. Target Date: 2024-06-01 Frequency: Biweekly  Progress: 80 Modality: individual  Related Interventions Assist the client in clarifying his/her role within the family and his/her feelings connected to that role. Objective Identify feelings associated with major traumatic incidents in childhood and with parental child-rearing patterns. Target Date: 2024-06-01 Frequency:  Biweekly  Progress: 70 Modality: individual  Related Interventions Support and encourage the client when he/she begins to express feelings of rage, sadness, fear, and rejection relating to family abuse or neglect. Assign the client to record feelings in a journal that describes memories, behavior, and emotions tied to his/her traumatic childhood experiences (or assign How the Trauma Affects Me in the Adult Psychotherapy Homework Planner by Jenniffer). Ask the client to read books on the emotional effects of neglect and abuse in childhood (e.g., It Will Never Happen to Me by [facility]; Outgrowing the Pain by Tory; Healing the Child Within by [facility]); process insights attained. 13. Reduce overall frequency, intensity, and duration of the anxiety so that daily functioning is not impaired. Objective Reestablish a consistent sleep-wake cycle. Target Date: 2024-06-01 Frequency: Biweekly  Progress: 50 Modality: individual  Related Interventions Teach and implement sleep hygiene practices to help the client reestablish a consistent sleep-wake cycle; review, reinforce success, and provide corrective  feedback toward improvement. Objective Identify, challenge, and replace biased, fearful self-talk with positive, realistic, and empowering self-talk. Target Date: 2024-06-01 Frequency: Biweekly  Progress: 40 Modality: individual  Related Interventions Explore the client's schema and self-talk that mediate his/her fear response; assist him/her in challenging the biases; replace the distorted messages with reality-based alternatives and positive, realistic self-talk that will increase his/her self-confidence in coping with irrational fears (see Cognitive Therapy of Anxiety Disorders by Gretta armin Mon). Assign the client a homework exercise in which he/she identifies fearful self-talk, identifies biases in the self-talk, generates alternatives, and tests through behavioral experiments (or assign Negative Thoughts Trigger Negative Feelings in the Adult Psychotherapy Homework Planner by [facility]); review and reinforce success, providing corrective feedback toward improvement. Objective Learn and implement problem-solving strategies for realistically addressing worries. Target Date: 2024-06-01 Frequency: Biweekly  Progress: 60 Modality: individual  Related Interventions Teach the client problem-solving strategies involving specifically defining a problem, generating options for addressing it, evaluating the pros and cons of each option, selecting and implementing an optional action, and reevaluating and refining the action (or assign Applying Problem-Solving to Interpersonal Conflict in the Adult Psychotherapy Homework Planner by Jenniffer). Objective Learn and implement calming skills to reduce overall anxiety and manage anxiety symptoms. Target Date: 2024-06-01 Frequency: Biweekly  Progress: 70 Modality: individual  Related Interventions Teach the client calming/relaxation skills (e.g., applied relaxation, progressive muscle relaxation, cue controlled relaxation; mindful breathing; biofeedback) and  how to discriminate better between relaxation and tension; teach the client how to apply these skills to his/her daily life (e.g., New Directions in Progressive Muscle Relaxation by Thornell Collier, and Hazlett-Stevens; Treating Generalized Anxiety Disorder by Rygh and Red). Assign the client to read about progressive muscle relaxation and other calming strategies in relevant books or treatment manuals (e.g., Progressive Relaxation Training by Thornell and Collier; Mastery of Your Anxiety and Worry: Workbook by Richarda armin Given). Objective Learn and implement relapse prevention strategies for managing possible future anxiety symptoms. Target Date: 2024-06-01 Frequency: Biweekly  Progress: 40 Modality: individual  Related Interventions Discuss with the client the distinction between a lapse and relapse, associating a lapse with an initial and reversible return of worry, anxiety symptoms, or urges to avoid, and relapse with the decision to continue the fearful and avoidant patterns. Identify and rehearse with the client the management of future situations or circumstances in which lapses could occur. Instruct the client to routinely use new therapeutic skills (e.g., relaxation, cognitive restructuring, exposure, and problem-solving) in daily life to address emergent worries, anxiety, and avoidant tendencies. Develop  a coping card on which coping strategies and other important information (e.g., Breathe deeply and relax, Challenge unrealistic worries, Use problem-solving) are written for the client's later use.  Diagnosis:Major depressive disorder, recurrent episode, moderate (HCC)  Generalized anxiety disorder  PTSD (post-traumatic stress disorder)  Plan:  -meet again Thursday, January 19, 2024 at 8am in-person

## 2024-01-16 ENCOUNTER — Encounter: Payer: Self-pay | Admitting: Family

## 2024-01-16 ENCOUNTER — Other Ambulatory Visit: Payer: Self-pay | Admitting: Family

## 2024-01-16 DIAGNOSIS — F419 Anxiety disorder, unspecified: Secondary | ICD-10-CM

## 2024-01-16 MED ORDER — KLONOPIN 0.5 MG PO TABS: 0.5000 mg | ORAL_TABLET | Freq: Three times a day (TID) | ORAL | 0 refills | Status: DC | PRN

## 2024-01-16 NOTE — Telephone Encounter (Signed)
 Requesting: klonopin  0.5mg  Contract:05/30/23 UDS: 05/30/23 Last Visit: 10/18/23 Next Visit: None Last Refill: 12/14/23 #90 and 0RF  Please Advise

## 2024-01-16 NOTE — Telephone Encounter (Signed)
 Please cancel Klonopin  at [employer]. Tks

## 2024-01-19 ENCOUNTER — Encounter: Payer: Self-pay | Admitting: Professional

## 2024-01-19 ENCOUNTER — Ambulatory Visit (INDEPENDENT_AMBULATORY_CARE_PROVIDER_SITE_OTHER): Payer: Self-pay | Admitting: Professional

## 2024-01-19 DIAGNOSIS — F4310 Post-traumatic stress disorder, unspecified: Secondary | ICD-10-CM | POA: Diagnosis not present

## 2024-01-19 DIAGNOSIS — F331 Major depressive disorder, recurrent, moderate: Secondary | ICD-10-CM | POA: Diagnosis not present

## 2024-01-19 DIAGNOSIS — F411 Generalized anxiety disorder: Secondary | ICD-10-CM

## 2024-01-19 NOTE — Progress Notes (Signed)
 [facility] Counselor/Therapist Progress Note  Patient ID: Shannon [name], MRN: [id number],    Date: 01/19/2024  Time Spent:  54 minutes [id number]  Treatment Type: Individual Therapy  Risk Assessment: Danger to Self:  No Self-injurious Behavior: No Danger to Others: No  Subjective: The patient arrived on time for her in person appointment.   Issues addressed: 1-mood -neutral 2-EMDR a-pt did not watch demo video and will do so prior to next session b-pt provided background of trauma -pt moved extensively in her youth and changed schools 4 times by 6th grade -pt's grandmother died when pt was 5 -Sister Shannon [name] beginning 6th grade year -told mother that she was the least intelligent, that Shannon [name] was the most and her sister was next 3-planning for fall-winter months -develop a daily schedule -don't cram everything into the morning and then have 8 hours of downtime before Shannon [name] gets home -plan for relaxation and down time in schedule 4-safe people in her life -had been her father and paternal grandmother -now is Shannon [name], son Shannon [name], daughter Shannon [name]   Treatment Plan Problems Addressed  Anxiety, Childhood Trauma, Low Self-Esteem, Unipolar Depression Goals 1. Alleviate depressive symptoms and return to previous level of effective functioning. 2. Appropriately grieve the loss in order to normalize mood and to return to previously adaptive level of functioning. Objective Learn and implement behavioral strategies to overcome depression. Target Date: 2024-06-01 Frequency: Biweekly  Progress: 60 Modality: individual  Related Interventions Engage the client in behavioral activation, increasing his/her activity level and contact with sources of reward, while identifying processes that inhibit activation (see Behavioral Activation for Depression by Loleta, Dimidjian, and Herman-Dunn; or assign Identify and Schedule Pleasant Activities in the Adult Psychotherapy Homework  Planner by [facility]); use behavioral techniques such as instruction, rehearsal, role-playing, role reversal, as needed, to facilitate activity in the client's daily life; reinforce success. Assist the client in developing skills that increase the likelihood of deriving pleasure from behavioral activation (e.g., assertiveness skills, developing an exercise plan, less internal/more external focus, increased social involvement); reinforce success. Objective Learn and implement conflict resolution skills to resolve interpersonal problems. Target Date: 2024-06-01 Frequency: Biweekly  Progress: 70 Modality: individual  Related Interventions Teach conflict resolution skills (e.g., empathy, active listening, I messages, respectful communication, assertiveness without aggression, compromise); use psychoeducation, modeling, role-playing, and rehearsal to work through several current conflicts; assign homework exercises; review and repeat so as to integrate their use into the client's life. Objective Learn and implement relapse prevention skills. Target Date: 2024-06-01 Frequency: Biweekly  Progress: 40 Modality: individual  Related Interventions Identify and rehearse with the client the management of future situations or circumstances in which lapses could occur. Objective Verbalize an understanding of healthy and unhealthy emotions with the intent of increasing the use of healthy emotions to guide actions. Target Date: 2024-06-01 Frequency: Biweekly  Progress: 80 Modality: individual  Related Interventions Use a process-experiential approach consistent with Emotion-Focused Therapy to create a safe, nurturing environment in which the client can process emotions, learning to identify and regulate unhealthy feelings and to generate more adaptive ones that then guide actions (see Emotion-Focused Therapy for Depression by Audrey armin Portugal). 3. Demonstrate improved self-esteem through more pride in  appearance, more assertiveness, greater eye contact, and identification of positive traits in self-talk messages. 4. Develop an awareness of how childhood issues have affected and continue to affect one's family life. 5. Develop healthy interpersonal relationships that lead to the alleviation and help prevent the relapse of depression. 6. Develop healthy thinking patterns  and beliefs about self, others, and the world that lead to the alleviation and help prevent the relapse of depression. 7. Elevate self-esteem. Objective Demonstrate an increased ability to identify and express personal feelings. Target Date: 2024-06-01 Frequency: Biweekly  Progress: 50 Modality: individual  Related Interventions Assist the client in identifying and labeling emotions. Objective Acknowledge feeling less competent than most others. Target Date: 06-01-2024 Frequency: Biweekly  Progress: 70 Modality: individual  Related Interventions Explore the client's assessment of himself/herself and what is verbalized as the basis for negative self-perception. Actively build the level of trust with the client in individual sessions through consistent eye contact, active listening, unconditional positive regard, and warm acceptance to help increase his/her ability to identify and express feelings. Objective Decrease the frequency of negative self-descriptive statements and increase frequency of positive self-descriptive statements. Target Date: 2024-06-01 Frequency: Biweekly  Progress: 40 Modality: individual  Related Interventions Assist the client in becoming aware of how he/she expresses or acts out negative feelings about himself/herself. Help the client reframe his/her negative assessment of himself/herself. Assist the client in developing positive self-talk as a way of boosting his/her confidence and self-image (or assign Positive Self-Talk in the Adult Psychotherapy Homework Planner by Jenniffer). Objective Identify  and engage in activities that would improve self-image by being consistent with one's values. Target Date: 2024-06-01 Frequency: Biweekly  Progress: 60 Modality: individual  Related Interventions Help the client analyze his/her values and the congruence or incongruence between them and the client's daily activities. Identify and assign activities congruent with the client's values; process them toward improving self-concept and self-esteem. Objective Identify positive traits and talents about self. Target Date: 2024-06-01 Frequency: Biweekly  Progress: 40 Modality: individual  Related Interventions Assign the client the exercise of identifying his/her positive physical characteristics in a mirror to help him/her become more comfortable with himself/herself. Ask the client to keep building a list of positive traits and have him/her read the list at the beginning and end of each session (or assign Acknowledging My Strengths or What Are My Good Qualities? in the Adult Psychotherapy Homework Planner by [facility]); reinforce the client's positive self-descriptive statements. 8. Enhance ability to effectively cope with the full variety of life's worries and anxieties. 9. Establish an inward sense of self-worth, confidence, and competence. 10. Interact socially without undue distress or disability. 11. Learn and implement coping skills that result in a reduction of anxiety and worry, and improved daily functioning. 12. Let go of blame and begin to forgive others for pain caused in childhood. Objective Increase level of trust of others as shown by more socialization and greater intimacy tolerance. Target Date: 2024-06-01 Frequency: Biweekly  Progress: 60 Modality: individual  Related Interventions Teach the client the share-check method of building trust in relationships (sharing a little information and checking as to the recipient's sensitivity in reacting to that information). Teach the client  the advantages of treating people as trustworthy given a reasonable amount of time to assess their character. Objective Describe each family member and identify the role each played within the family. Target Date: 2024-06-01 Frequency: Biweekly  Progress: 80 Modality: individual  Related Interventions Assist the client in clarifying his/her role within the family and his/her feelings connected to that role. Objective Identify feelings associated with major traumatic incidents in childhood and with parental child-rearing patterns. Target Date: 2024-06-01 Frequency: Biweekly  Progress: 70 Modality: individual  Related Interventions Support and encourage the client when he/she begins to express feelings of rage, sadness, fear, and rejection relating to  family abuse or neglect. Assign the client to record feelings in a journal that describes memories, behavior, and emotions tied to his/her traumatic childhood experiences (or assign How the Trauma Affects Me in the Adult Psychotherapy Homework Planner by Jenniffer). Ask the client to read books on the emotional effects of neglect and abuse in childhood (e.g., It Will Never Happen to Me by [facility]; Outgrowing the Pain by Tory; Healing the Child Within by [facility]); process insights attained. 13. Reduce overall frequency, intensity, and duration of the anxiety so that daily functioning is not impaired. Objective Reestablish a consistent sleep-wake cycle. Target Date: 2024-06-01 Frequency: Biweekly  Progress: 50 Modality: individual  Related Interventions Teach and implement sleep hygiene practices to help the client reestablish a consistent sleep-wake cycle; review, reinforce success, and provide corrective feedback toward improvement. Objective Identify, challenge, and replace biased, fearful self-talk with positive, realistic, and empowering self-talk. Target Date: 2024-06-01 Frequency: Biweekly  Progress: 40 Modality: individual  Related  Interventions Explore the client's schema and self-talk that mediate his/her fear response; assist him/her in challenging the biases; replace the distorted messages with reality-based alternatives and positive, realistic self-talk that will increase his/her self-confidence in coping with irrational fears (see Cognitive Therapy of Anxiety Disorders by Gretta armin Mon). Assign the client a homework exercise in which he/she identifies fearful self-talk, identifies biases in the self-talk, generates alternatives, and tests through behavioral experiments (or assign Negative Thoughts Trigger Negative Feelings in the Adult Psychotherapy Homework Planner by [facility]); review and reinforce success, providing corrective feedback toward improvement. Objective Learn and implement problem-solving strategies for realistically addressing worries. Target Date: 2024-06-01 Frequency: Biweekly  Progress: 60 Modality: individual  Related Interventions Teach the client problem-solving strategies involving specifically defining a problem, generating options for addressing it, evaluating the pros and cons of each option, selecting and implementing an optional action, and reevaluating and refining the action (or assign Applying Problem-Solving to Interpersonal Conflict in the Adult Psychotherapy Homework Planner by Jenniffer). Objective Learn and implement calming skills to reduce overall anxiety and manage anxiety symptoms. Target Date: 2024-06-01 Frequency: Biweekly  Progress: 70 Modality: individual  Related Interventions Teach the client calming/relaxation skills (e.g., applied relaxation, progressive muscle relaxation, cue controlled relaxation; mindful breathing; biofeedback) and how to discriminate better between relaxation and tension; teach the client how to apply these skills to his/her daily life (e.g., New Directions in Progressive Muscle Relaxation by Thornell Collier, and Hazlett-Stevens; Treating Generalized  Anxiety Disorder by Rygh and Red). Assign the client to read about progressive muscle relaxation and other calming strategies in relevant books or treatment manuals (e.g., Progressive Relaxation Training by Thornell and Collier; Mastery of Your Anxiety and Worry: Workbook by Richarda armin Given). Objective Learn and implement relapse prevention strategies for managing possible future anxiety symptoms. Target Date: 2024-06-01 Frequency: Biweekly  Progress: 40 Modality: individual  Related Interventions Discuss with the client the distinction between a lapse and relapse, associating a lapse with an initial and reversible return of worry, anxiety symptoms, or urges to avoid, and relapse with the decision to continue the fearful and avoidant patterns. Identify and rehearse with the client the management of future situations or circumstances in which lapses could occur. Instruct the client to routinely use new therapeutic skills (e.g., relaxation, cognitive restructuring, exposure, and problem-solving) in daily life to address emergent worries, anxiety, and avoidant tendencies. Develop a coping card on which coping strategies and other important information (e.g., Breathe deeply and relax, Challenge unrealistic worries, Use problem-solving) are written for the client's later use.  Diagnosis:Major depressive disorder, recurrent episode, moderate (HCC)  Generalized anxiety disorder  PTSD (post-traumatic stress disorder)  Plan:  -meet again Friday, January 27, 2024 at 8am in-person

## 2024-01-24 ENCOUNTER — Encounter: Payer: Self-pay | Admitting: Family

## 2024-01-24 DIAGNOSIS — M2550 Pain in unspecified joint: Secondary | ICD-10-CM

## 2024-01-27 ENCOUNTER — Ambulatory Visit: Payer: Self-pay | Admitting: Professional

## 2024-01-27 ENCOUNTER — Encounter: Payer: Self-pay | Admitting: Professional

## 2024-01-27 DIAGNOSIS — F331 Major depressive disorder, recurrent, moderate: Secondary | ICD-10-CM | POA: Diagnosis not present

## 2024-01-27 DIAGNOSIS — F4310 Post-traumatic stress disorder, unspecified: Secondary | ICD-10-CM

## 2024-01-27 DIAGNOSIS — F411 Generalized anxiety disorder: Secondary | ICD-10-CM | POA: Diagnosis not present

## 2024-01-27 NOTE — Progress Notes (Signed)
 [facility] Counselor/Therapist Progress Note  Patient ID: Shannon [name], MRN: [id number],    Date: 01/27/2024  Time Spent:  39 minutes [id number]  Treatment Type: Individual Therapy  Risk Assessment: Danger to Self:  No Self-injurious Behavior: No Danger to Others: No  Subjective: The patient arrived on time for her in person appointment.   Issues addressed: 1-mood -pleasant and easily engaged -ongoing struggle with sadness related to father's death 2-butterflies -one showed up for her and then left -two showed up for her sister and then left -neither knew and when sharing both agreed that appeared to be closure related -pt was tearful when discussing father 3-positive thinking -techniques for engaging positive thinking -importance of moving away from being condescending toward herself -write positive thoughts daily on her mirror  Treatment Plan Problems Addressed  Anxiety, Childhood Trauma, Low Self-Esteem, Unipolar Depression Goals 1. Alleviate depressive symptoms and return to previous level of effective functioning. 2. Appropriately grieve the loss in order to normalize mood and to return to previously adaptive level of functioning. Objective Learn and implement behavioral strategies to overcome depression. Target Date: 2024-06-01 Frequency: Biweekly  Progress: 60 Modality: individual  Related Interventions Engage the client in behavioral activation, increasing his/her activity level and contact with sources of reward, while identifying processes that inhibit activation (see Behavioral Activation for Depression by Loleta, Dimidjian, and Herman-Dunn; or assign Identify and Schedule Pleasant Activities in the Adult Psychotherapy Homework Planner by [facility]); use behavioral techniques such as instruction, rehearsal, role-playing, role reversal, as needed, to facilitate activity in the client's daily life; reinforce success. Assist the client in  developing skills that increase the likelihood of deriving pleasure from behavioral activation (e.g., assertiveness skills, developing an exercise plan, less internal/more external focus, increased social involvement); reinforce success. Objective Learn and implement conflict resolution skills to resolve interpersonal problems. Target Date: 2024-06-01 Frequency: Biweekly  Progress: 70 Modality: individual  Related Interventions Teach conflict resolution skills (e.g., empathy, active listening, I messages, respectful communication, assertiveness without aggression, compromise); use psychoeducation, modeling, role-playing, and rehearsal to work through several current conflicts; assign homework exercises; review and repeat so as to integrate their use into the client's life. Objective Learn and implement relapse prevention skills. Target Date: 2024-06-01 Frequency: Biweekly  Progress: 40 Modality: individual  Related Interventions Identify and rehearse with the client the management of future situations or circumstances in which lapses could occur. Objective Verbalize an understanding of healthy and unhealthy emotions with the intent of increasing the use of healthy emotions to guide actions. Target Date: 2024-06-01 Frequency: Biweekly  Progress: 80 Modality: individual  Related Interventions Use a process-experiential approach consistent with Emotion-Focused Therapy to create a safe, nurturing environment in which the client can process emotions, learning to identify and regulate unhealthy feelings and to generate more adaptive ones that then guide actions (see Emotion-Focused Therapy for Depression by Audrey armin Portugal). 3. Demonstrate improved self-esteem through more pride in appearance, more assertiveness, greater eye contact, and identification of positive traits in self-talk messages. 4. Develop an awareness of how childhood issues have affected and continue to affect one's family  life. 5. Develop healthy interpersonal relationships that lead to the alleviation and help prevent the relapse of depression. 6. Develop healthy thinking patterns and beliefs about self, others, and the world that lead to the alleviation and help prevent the relapse of depression. 7. Elevate self-esteem. Objective Demonstrate an increased ability to identify and express personal feelings. Target Date: 2024-06-01 Frequency: Biweekly  Progress: 50 Modality: individual  Related Interventions  Assist the client in identifying and labeling emotions. Objective Acknowledge feeling less competent than most others. Target Date: 06-01-2024 Frequency: Biweekly  Progress: 70 Modality: individual  Related Interventions Explore the client's assessment of himself/herself and what is verbalized as the basis for negative self-perception. Actively build the level of trust with the client in individual sessions through consistent eye contact, active listening, unconditional positive regard, and warm acceptance to help increase his/her ability to identify and express feelings. Objective Decrease the frequency of negative self-descriptive statements and increase frequency of positive self-descriptive statements. Target Date: 2024-06-01 Frequency: Biweekly  Progress: 40 Modality: individual  Related Interventions Assist the client in becoming aware of how he/she expresses or acts out negative feelings about himself/herself. Help the client reframe his/her negative assessment of himself/herself. Assist the client in developing positive self-talk as a way of boosting his/her confidence and self-image (or assign Positive Self-Talk in the Adult Psychotherapy Homework Planner by Jenniffer). Objective Identify and engage in activities that would improve self-image by being consistent with one's values. Target Date: 2024-06-01 Frequency: Biweekly  Progress: 60 Modality: individual  Related Interventions Help the  client analyze his/her values and the congruence or incongruence between them and the client's daily activities. Identify and assign activities congruent with the client's values; process them toward improving self-concept and self-esteem. Objective Identify positive traits and talents about self. Target Date: 2024-06-01 Frequency: Biweekly  Progress: 40 Modality: individual  Related Interventions Assign the client the exercise of identifying his/her positive physical characteristics in a mirror to help him/her become more comfortable with himself/herself. Ask the client to keep building a list of positive traits and have him/her read the list at the beginning and end of each session (or assign Acknowledging My Strengths or What Are My Good Qualities? in the Adult Psychotherapy Homework Planner by [facility]); reinforce the client's positive self-descriptive statements. 8. Enhance ability to effectively cope with the full variety of life's worries and anxieties. 9. Establish an inward sense of self-worth, confidence, and competence. 10. Interact socially without undue distress or disability. 11. Learn and implement coping skills that result in a reduction of anxiety and worry, and improved daily functioning. 12. Let go of blame and begin to forgive others for pain caused in childhood. Objective Increase level of trust of others as shown by more socialization and greater intimacy tolerance. Target Date: 2024-06-01 Frequency: Biweekly  Progress: 60 Modality: individual  Related Interventions Teach the client the share-check method of building trust in relationships (sharing a little information and checking as to the recipient's sensitivity in reacting to that information). Teach the client the advantages of treating people as trustworthy given a reasonable amount of time to assess their character. Objective Describe each family member and identify the role each played within the family. Target  Date: 2024-06-01 Frequency: Biweekly  Progress: 80 Modality: individual  Related Interventions Assist the client in clarifying his/her role within the family and his/her feelings connected to that role. Objective Identify feelings associated with major traumatic incidents in childhood and with parental child-rearing patterns. Target Date: 2024-06-01 Frequency: Biweekly  Progress: 70 Modality: individual  Related Interventions Support and encourage the client when he/she begins to express feelings of rage, sadness, fear, and rejection relating to family abuse or neglect. Assign the client to record feelings in a journal that describes memories, behavior, and emotions tied to his/her traumatic childhood experiences (or assign How the Trauma Affects Me in the Adult Psychotherapy Homework Planner by Jenniffer). Ask the client to read books on  the emotional effects of neglect and abuse in childhood (e.g., It Will Never Happen to Me by [facility]; Outgrowing the Pain by Tory; Healing the Child Within by [facility]); process insights attained. 13. Reduce overall frequency, intensity, and duration of the anxiety so that daily functioning is not impaired. Objective Reestablish a consistent sleep-wake cycle. Target Date: 2024-06-01 Frequency: Biweekly  Progress: 50 Modality: individual  Related Interventions Teach and implement sleep hygiene practices to help the client reestablish a consistent sleep-wake cycle; review, reinforce success, and provide corrective feedback toward improvement. Objective Identify, challenge, and replace biased, fearful self-talk with positive, realistic, and empowering self-talk. Target Date: 2024-06-01 Frequency: Biweekly  Progress: 40 Modality: individual  Related Interventions Explore the client's schema and self-talk that mediate his/her fear response; assist him/her in challenging the biases; replace the distorted messages with reality-based alternatives and positive,  realistic self-talk that will increase his/her self-confidence in coping with irrational fears (see Cognitive Therapy of Anxiety Disorders by Gretta armin Mon). Assign the client a homework exercise in which he/she identifies fearful self-talk, identifies biases in the self-talk, generates alternatives, and tests through behavioral experiments (or assign Negative Thoughts Trigger Negative Feelings in the Adult Psychotherapy Homework Planner by [facility]); review and reinforce success, providing corrective feedback toward improvement. Objective Learn and implement problem-solving strategies for realistically addressing worries. Target Date: 2024-06-01 Frequency: Biweekly  Progress: 60 Modality: individual  Related Interventions Teach the client problem-solving strategies involving specifically defining a problem, generating options for addressing it, evaluating the pros and cons of each option, selecting and implementing an optional action, and reevaluating and refining the action (or assign Applying Problem-Solving to Interpersonal Conflict in the Adult Psychotherapy Homework Planner by Jenniffer). Objective Learn and implement calming skills to reduce overall anxiety and manage anxiety symptoms. Target Date: 2024-06-01 Frequency: Biweekly  Progress: 70 Modality: individual  Related Interventions Teach the client calming/relaxation skills (e.g., applied relaxation, progressive muscle relaxation, cue controlled relaxation; mindful breathing; biofeedback) and how to discriminate better between relaxation and tension; teach the client how to apply these skills to his/her daily life (e.g., New Directions in Progressive Muscle Relaxation by Thornell Collier, and Hazlett-Stevens; Treating Generalized Anxiety Disorder by Rygh and Red). Assign the client to read about progressive muscle relaxation and other calming strategies in relevant books or treatment manuals (e.g., Progressive Relaxation Training  by Thornell and Collier; Mastery of Your Anxiety and Worry: Workbook by Richarda armin Given). Objective Learn and implement relapse prevention strategies for managing possible future anxiety symptoms. Target Date: 2024-06-01 Frequency: Biweekly  Progress: 40 Modality: individual  Related Interventions Discuss with the client the distinction between a lapse and relapse, associating a lapse with an initial and reversible return of worry, anxiety symptoms, or urges to avoid, and relapse with the decision to continue the fearful and avoidant patterns. Identify and rehearse with the client the management of future situations or circumstances in which lapses could occur. Instruct the client to routinely use new therapeutic skills (e.g., relaxation, cognitive restructuring, exposure, and problem-solving) in daily life to address emergent worries, anxiety, and avoidant tendencies. Develop a coping card on which coping strategies and other important information (e.g., Breathe deeply and relax, Challenge unrealistic worries, Use problem-solving) are written for the client's later use.  Diagnosis:Major depressive disorder, recurrent episode, moderate (HCC)  Generalized anxiety disorder  PTSD (post-traumatic stress disorder)  Plan:  -meet again Friday, March 02, 2024 at [facility] in-person

## 2024-01-31 ENCOUNTER — Other Ambulatory Visit

## 2024-02-01 ENCOUNTER — Other Ambulatory Visit (INDEPENDENT_AMBULATORY_CARE_PROVIDER_SITE_OTHER)

## 2024-02-01 DIAGNOSIS — M2550 Pain in unspecified joint: Secondary | ICD-10-CM

## 2024-02-01 LAB — SEDIMENTATION RATE: Sed Rate: <1 mm/h (ref 0–30)

## 2024-02-02 LAB — ANA: Anti Nuclear Antibody (ANA): NEGATIVE

## 2024-02-02 LAB — RHEUMATOID FACTOR: Rheumatoid fact SerPl-aCnc: <10 [IU]/mL (ref <14)

## 2024-02-06 ENCOUNTER — Ambulatory Visit: Payer: Self-pay | Admitting: Family

## 2024-02-14 ENCOUNTER — Other Ambulatory Visit: Payer: Self-pay | Admitting: Family

## 2024-02-14 DIAGNOSIS — F419 Anxiety disorder, unspecified: Secondary | ICD-10-CM

## 2024-02-14 NOTE — Telephone Encounter (Signed)
 Requesting: Klonopin  0.5mg   Contract: 05/30/23 UDS: 05/30/23 Last Visit: 10/18/23 Next Visit: None Last Refill: 01/16/24 #90 and 9mq   Please Advise

## 2024-03-02 ENCOUNTER — Ambulatory Visit: Admitting: Professional

## 2024-03-07 ENCOUNTER — Ambulatory Visit: Admitting: Family

## 2024-03-14 ENCOUNTER — Other Ambulatory Visit: Payer: Self-pay | Admitting: Family

## 2024-03-14 ENCOUNTER — Encounter: Payer: Self-pay | Admitting: Neurology

## 2024-03-14 DIAGNOSIS — F419 Anxiety disorder, unspecified: Secondary | ICD-10-CM

## 2024-03-15 NOTE — Telephone Encounter (Signed)
 Got her scheduled

## 2024-03-15 NOTE — Telephone Encounter (Signed)
 Please contact pt to schedule a follow up visit in December.

## 2024-03-15 NOTE — Telephone Encounter (Signed)
 Requesting: Klonopin  0.5mg  Contract:05/30/23 UDS:05/30/23 Last Visit: 10/18/23 Next Visit: None Last Refill: 02/14/24 #90 and 0RF   Please Advise

## 2024-03-16 ENCOUNTER — Encounter: Payer: Self-pay | Admitting: Professional

## 2024-03-16 ENCOUNTER — Ambulatory Visit (INDEPENDENT_AMBULATORY_CARE_PROVIDER_SITE_OTHER): Payer: Self-pay | Admitting: Professional

## 2024-03-16 DIAGNOSIS — F4310 Post-traumatic stress disorder, unspecified: Secondary | ICD-10-CM

## 2024-03-16 DIAGNOSIS — F411 Generalized anxiety disorder: Secondary | ICD-10-CM

## 2024-03-16 DIAGNOSIS — F331 Major depressive disorder, recurrent, moderate: Secondary | ICD-10-CM | POA: Diagnosis not present

## 2024-03-16 NOTE — Progress Notes (Signed)
 [facility] Counselor/Therapist Progress Note  Patient ID: Shannon [name], MRN: [id number],    Date: 03/16/2024  Time Spent: 57 minutes [id number]  Treatment Type: Individual Therapy  Risk Assessment: Danger to Self:  No Self-injurious Behavior: No Danger to Others: No  Subjective: The patient arrived on time for her in person appointment.   Issues addressed: 1-mood -pleasant and intermittently tearful 2-probate completed and she is feeling the finality a-pt frustrated with son Adina who refused her gift from her parent's inheritance calling it blood money -she asked him if he was sure and is not going to offer to him again b-she feels good that she has put money in two trusts, one for each granddaughter from her parent's estate c-feeling so sad over father's death -pt admits it feels like she is just now grieving d-encouraged pt to get her feelings out 3-move to [employer] -pt spent most of October at [employer] and it was dreary and depressing -pt has talked with spouse and is second-guessing selling and moving there -pt's spouse is retiring at the end of December and he wants to move -they are discussing possibly getting a smaller home in [place] -encouraged patient to begin while in [place] to develop her activities and interests during the winter months -encouraged her to begin researching how she would like to spend her time once she moves  Treatment Plan Problems Addressed  Anxiety, Childhood Trauma, Low Self-Esteem, Unipolar Depression Goals 1. Alleviate depressive symptoms and return to previous level of effective functioning. 2. Appropriately grieve the loss in order to normalize mood and to return to previously adaptive level of functioning. Objective Learn and implement behavioral strategies to overcome depression. Target Date: 2024-06-01 Frequency: Biweekly  Progress: 60 Modality: individual  Related Interventions Engage the client in  behavioral activation, increasing his/her activity level and contact with sources of reward, while identifying processes that inhibit activation (see Behavioral Activation for Depression by Loleta, Dimidjian, and Herman-Dunn; or assign Identify and Schedule Pleasant Activities in the Adult Psychotherapy Homework Planner by [facility]); use behavioral techniques such as instruction, rehearsal, role-playing, role reversal, as needed, to facilitate activity in the client's daily life; reinforce success. Assist the client in developing skills that increase the likelihood of deriving pleasure from behavioral activation (e.g., assertiveness skills, developing an exercise plan, less internal/more external focus, increased social involvement); reinforce success. Objective Learn and implement conflict resolution skills to resolve interpersonal problems. Target Date: 2024-06-01 Frequency: Biweekly  Progress: 70 Modality: individual  Related Interventions Teach conflict resolution skills (e.g., empathy, active listening, I messages, respectful communication, assertiveness without aggression, compromise); use psychoeducation, modeling, role-playing, and rehearsal to work through several current conflicts; assign homework exercises; review and repeat so as to integrate their use into the client's life. Objective Learn and implement relapse prevention skills. Target Date: 2024-06-01 Frequency: Biweekly  Progress: 40 Modality: individual  Related Interventions Identify and rehearse with the client the management of future situations or circumstances in which lapses could occur. Objective Verbalize an understanding of healthy and unhealthy emotions with the intent of increasing the use of healthy emotions to guide actions. Target Date: 2024-06-01 Frequency: Biweekly  Progress: 80 Modality: individual  Related Interventions Use a process-experiential approach consistent with Emotion-Focused Therapy to create a  safe, nurturing environment in which the client can process emotions, learning to identify and regulate unhealthy feelings and to generate more adaptive ones that then guide actions (see Emotion-Focused Therapy for Depression by Audrey armin Portugal). 3. Demonstrate improved self-esteem through  more pride in appearance, more assertiveness, greater eye contact, and identification of positive traits in self-talk messages. 4. Develop an awareness of how childhood issues have affected and continue to affect one's family life. 5. Develop healthy interpersonal relationships that lead to the alleviation and help prevent the relapse of depression. 6. Develop healthy thinking patterns and beliefs about self, others, and the world that lead to the alleviation and help prevent the relapse of depression. 7. Elevate self-esteem. Objective Demonstrate an increased ability to identify and express personal feelings. Target Date: 2024-06-01 Frequency: Biweekly  Progress: 50 Modality: individual  Related Interventions Assist the client in identifying and labeling emotions. Objective Acknowledge feeling less competent than most others. Target Date: 06-01-2024 Frequency: Biweekly  Progress: 70 Modality: individual  Related Interventions Explore the client's assessment of himself/herself and what is verbalized as the basis for negative self-perception. Actively build the level of trust with the client in individual sessions through consistent eye contact, active listening, unconditional positive regard, and warm acceptance to help increase his/her ability to identify and express feelings. Objective Decrease the frequency of negative self-descriptive statements and increase frequency of positive self-descriptive statements. Target Date: 2024-06-01 Frequency: Biweekly  Progress: 40 Modality: individual  Related Interventions Assist the client in becoming aware of how he/she expresses or acts out negative feelings  about himself/herself. Help the client reframe his/her negative assessment of himself/herself. Assist the client in developing positive self-talk as a way of boosting his/her confidence and self-image (or assign Positive Self-Talk in the Adult Psychotherapy Homework Planner by Jenniffer). Objective Identify and engage in activities that would improve self-image by being consistent with one's values. Target Date: 2024-06-01 Frequency: Biweekly  Progress: 60 Modality: individual  Related Interventions Help the client analyze his/her values and the congruence or incongruence between them and the client's daily activities. Identify and assign activities congruent with the client's values; process them toward improving self-concept and self-esteem. Objective Identify positive traits and talents about self. Target Date: 2024-06-01 Frequency: Biweekly  Progress: 40 Modality: individual  Related Interventions Assign the client the exercise of identifying his/her positive physical characteristics in a mirror to help him/her become more comfortable with himself/herself. Ask the client to keep building a list of positive traits and have him/her read the list at the beginning and end of each session (or assign Acknowledging My Strengths or What Are My Good Qualities? in the Adult Psychotherapy Homework Planner by [facility]); reinforce the client's positive self-descriptive statements. 8. Enhance ability to effectively cope with the full variety of life's worries and anxieties. 9. Establish an inward sense of self-worth, confidence, and competence. 10. Interact socially without undue distress or disability. 11. Learn and implement coping skills that result in a reduction of anxiety and worry, and improved daily functioning. 12. Let go of blame and begin to forgive others for pain caused in childhood. Objective Increase level of trust of others as shown by more socialization and greater intimacy  tolerance. Target Date: 2024-06-01 Frequency: Biweekly  Progress: 60 Modality: individual  Related Interventions Teach the client the share-check method of building trust in relationships (sharing a little information and checking as to the recipient's sensitivity in reacting to that information). Teach the client the advantages of treating people as trustworthy given a reasonable amount of time to assess their character. Objective Describe each family member and identify the role each played within the family. Target Date: 2024-06-01 Frequency: Biweekly  Progress: 80 Modality: individual  Related Interventions Assist the client in clarifying his/her role within  the family and his/her feelings connected to that role. Objective Identify feelings associated with major traumatic incidents in childhood and with parental child-rearing patterns. Target Date: 2024-06-01 Frequency: Biweekly  Progress: 70 Modality: individual  Related Interventions Support and encourage the client when he/she begins to express feelings of rage, sadness, fear, and rejection relating to family abuse or neglect. Assign the client to record feelings in a journal that describes memories, behavior, and emotions tied to his/her traumatic childhood experiences (or assign How the Trauma Affects Me in the Adult Psychotherapy Homework Planner by Jenniffer). Ask the client to read books on the emotional effects of neglect and abuse in childhood (e.g., It Will Never Happen to Me by [facility]; Outgrowing the Pain by Tory; Healing the Child Within by [facility]); process insights attained. 13. Reduce overall frequency, intensity, and duration of the anxiety so that daily functioning is not impaired. Objective Reestablish a consistent sleep-wake cycle. Target Date: 2024-06-01 Frequency: Biweekly  Progress: 50 Modality: individual  Related Interventions Teach and implement sleep hygiene practices to help the client reestablish a  consistent sleep-wake cycle; review, reinforce success, and provide corrective feedback toward improvement. Objective Identify, challenge, and replace biased, fearful self-talk with positive, realistic, and empowering self-talk. Target Date: 2024-06-01 Frequency: Biweekly  Progress: 40 Modality: individual  Related Interventions Explore the client's schema and self-talk that mediate his/her fear response; assist him/her in challenging the biases; replace the distorted messages with reality-based alternatives and positive, realistic self-talk that will increase his/her self-confidence in coping with irrational fears (see Cognitive Therapy of Anxiety Disorders by Gretta armin Mon). Assign the client a homework exercise in which he/she identifies fearful self-talk, identifies biases in the self-talk, generates alternatives, and tests through behavioral experiments (or assign Negative Thoughts Trigger Negative Feelings in the Adult Psychotherapy Homework Planner by [facility]); review and reinforce success, providing corrective feedback toward improvement. Objective Learn and implement problem-solving strategies for realistically addressing worries. Target Date: 2024-06-01 Frequency: Biweekly  Progress: 60 Modality: individual  Related Interventions Teach the client problem-solving strategies involving specifically defining a problem, generating options for addressing it, evaluating the pros and cons of each option, selecting and implementing an optional action, and reevaluating and refining the action (or assign Applying Problem-Solving to Interpersonal Conflict in the Adult Psychotherapy Homework Planner by Jenniffer). Objective Learn and implement calming skills to reduce overall anxiety and manage anxiety symptoms. Target Date: 2024-06-01 Frequency: Biweekly  Progress: 70 Modality: individual  Related Interventions Teach the client calming/relaxation skills (e.g., applied relaxation, progressive  muscle relaxation, cue controlled relaxation; mindful breathing; biofeedback) and how to discriminate better between relaxation and tension; teach the client how to apply these skills to his/her daily life (e.g., New Directions in Progressive Muscle Relaxation by Thornell Collier, and Hazlett-Stevens; Treating Generalized Anxiety Disorder by Rygh and Red). Assign the client to read about progressive muscle relaxation and other calming strategies in relevant books or treatment manuals (e.g., Progressive Relaxation Training by Thornell and Collier; Mastery of Your Anxiety and Worry: Workbook by Richarda armin Given). Objective Learn and implement relapse prevention strategies for managing possible future anxiety symptoms. Target Date: 2024-06-01 Frequency: Biweekly  Progress: 40 Modality: individual  Related Interventions Discuss with the client the distinction between a lapse and relapse, associating a lapse with an initial and reversible return of worry, anxiety symptoms, or urges to avoid, and relapse with the decision to continue the fearful and avoidant patterns. Identify and rehearse with the client the management of future situations or circumstances in which lapses could occur.  Instruct the client to routinely use new therapeutic skills (e.g., relaxation, cognitive restructuring, exposure, and problem-solving) in daily life to address emergent worries, anxiety, and avoidant tendencies. Develop a coping card on which coping strategies and other important information (e.g., Breathe deeply and relax, Challenge unrealistic worries, Use problem-solving) are written for the client's later use.  Diagnosis:Major depressive disorder, recurrent episode, moderate (HCC)  Generalized anxiety disorder  PTSD (post-traumatic stress disorder)  Plan:  -meet again Thursday, March 22, 2024 at 10am in-person

## 2024-03-22 ENCOUNTER — Ambulatory Visit: Payer: Self-pay | Admitting: Professional

## 2024-04-05 ENCOUNTER — Ambulatory Visit: Admitting: Professional

## 2024-04-10 ENCOUNTER — Other Ambulatory Visit: Payer: Self-pay | Admitting: Family

## 2024-04-10 DIAGNOSIS — F419 Anxiety disorder, unspecified: Secondary | ICD-10-CM

## 2024-04-13 ENCOUNTER — Encounter: Payer: Self-pay | Admitting: Professional

## 2024-04-13 ENCOUNTER — Ambulatory Visit: Admitting: Professional

## 2024-04-13 ENCOUNTER — Other Ambulatory Visit: Payer: Self-pay | Admitting: Family

## 2024-04-13 DIAGNOSIS — F4310 Post-traumatic stress disorder, unspecified: Secondary | ICD-10-CM

## 2024-04-13 DIAGNOSIS — F411 Generalized anxiety disorder: Secondary | ICD-10-CM | POA: Diagnosis not present

## 2024-04-13 DIAGNOSIS — F331 Major depressive disorder, recurrent, moderate: Secondary | ICD-10-CM | POA: Diagnosis not present

## 2024-04-13 NOTE — Progress Notes (Unsigned)
 [facility] Counselor/Therapist Progress Note  Patient ID: Kimyah Frein, MRN: [id number],    Date: 04/13/2024  Time Spent: 49 minutes 903-952am  Treatment Type: Individual Therapy  Risk Assessment: Danger to Self:  No Self-injurious Behavior: No Danger to Others: No  Subjective: The patient arrived on time for her in person appointment.   Issues addressed: 1-Thanksgiving 2-Matt 3-Christmas  Treatment Plan Problems Addressed  Anxiety, Childhood Trauma, Low Self-Esteem, Unipolar Depression Goals 1. Alleviate depressive symptoms and return to previous level of effective functioning. 2. Appropriately grieve the loss in order to normalize mood and to return to previously adaptive level of functioning. Objective Learn and implement behavioral strategies to overcome depression. Target Date: 2024-06-01 Frequency: Biweekly  Progress: 60 Modality: individual  Related Interventions Engage the client in behavioral activation, increasing his/her activity level and contact with sources of reward, while identifying processes that inhibit activation (see Behavioral Activation for Depression by Loleta, Dimidjian, and Herman-Dunn; or assign Identify and Schedule Pleasant Activities in the Adult Psychotherapy Homework Planner by [facility]); use behavioral techniques such as instruction, rehearsal, role-playing, role reversal, as needed, to facilitate activity in the client's daily life; reinforce success. Assist the client in developing skills that increase the likelihood of deriving pleasure from behavioral activation (e.g., assertiveness skills, developing an exercise plan, less internal/more external focus, increased social involvement); reinforce success. Objective Learn and implement conflict resolution skills to resolve interpersonal problems. Target Date: 2024-06-01 Frequency: Biweekly  Progress: 70 Modality: individual  Related Interventions Teach conflict resolution  skills (e.g., empathy, active listening, I messages, respectful communication, assertiveness without aggression, compromise); use psychoeducation, modeling, role-playing, and rehearsal to work through several current conflicts; assign homework exercises; review and repeat so as to integrate their use into the client's life. Objective Learn and implement relapse prevention skills. Target Date: 2024-06-01 Frequency: Biweekly  Progress: 40 Modality: individual  Related Interventions Identify and rehearse with the client the management of future situations or circumstances in which lapses could occur. Objective Verbalize an understanding of healthy and unhealthy emotions with the intent of increasing the use of healthy emotions to guide actions. Target Date: 2024-06-01 Frequency: Biweekly  Progress: 80 Modality: individual  Related Interventions Use a process-experiential approach consistent with Emotion-Focused Therapy to create a safe, nurturing environment in which the client can process emotions, learning to identify and regulate unhealthy feelings and to generate more adaptive ones that then guide actions (see Emotion-Focused Therapy for Depression by Audrey armin Portugal). 3. Demonstrate improved self-esteem through more pride in appearance, more assertiveness, greater eye contact, and identification of positive traits in self-talk messages. 4. Develop an awareness of how childhood issues have affected and continue to affect one's family life. 5. Develop healthy interpersonal relationships that lead to the alleviation and help prevent the relapse of depression. 6. Develop healthy thinking patterns and beliefs about self, others, and the world that lead to the alleviation and help prevent the relapse of depression. 7. Elevate self-esteem. Objective Demonstrate an increased ability to identify and express personal feelings. Target Date: 2024-06-01 Frequency: Biweekly  Progress: 50 Modality:  individual  Related Interventions Assist the client in identifying and labeling emotions. Objective Acknowledge feeling less competent than most others. Target Date: 06-01-2024 Frequency: Biweekly  Progress: 70 Modality: individual  Related Interventions Explore the client's assessment of himself/herself and what is verbalized as the basis for negative self-perception. Actively build the level of trust with the client in individual sessions through consistent eye contact, active listening, unconditional positive regard, and warm acceptance to help  increase his/her ability to identify and express feelings. Objective Decrease the frequency of negative self-descriptive statements and increase frequency of positive self-descriptive statements. Target Date: 2024-06-01 Frequency: Biweekly  Progress: 40 Modality: individual  Related Interventions Assist the client in becoming aware of how he/she expresses or acts out negative feelings about himself/herself. Help the client reframe his/her negative assessment of himself/herself. Assist the client in developing positive self-talk as a way of boosting his/her confidence and self-image (or assign Positive Self-Talk in the Adult Psychotherapy Homework Planner by Jenniffer). Objective Identify and engage in activities that would improve self-image by being consistent with one's values. Target Date: 2024-06-01 Frequency: Biweekly  Progress: 60 Modality: individual  Related Interventions Help the client analyze his/her values and the congruence or incongruence between them and the client's daily activities. Identify and assign activities congruent with the client's values; process them toward improving self-concept and self-esteem. Objective Identify positive traits and talents about self. Target Date: 2024-06-01 Frequency: Biweekly  Progress: 40 Modality: individual  Related Interventions Assign the client the exercise of identifying his/her positive  physical characteristics in a mirror to help him/her become more comfortable with himself/herself. Ask the client to keep building a list of positive traits and have him/her read the list at the beginning and end of each session (or assign Acknowledging My Strengths or What Are My Good Qualities? in the Adult Psychotherapy Homework Planner by [facility]); reinforce the client's positive self-descriptive statements. 8. Enhance ability to effectively cope with the full variety of life's worries and anxieties. 9. Establish an inward sense of self-worth, confidence, and competence. 10. Interact socially without undue distress or disability. 11. Learn and implement coping skills that result in a reduction of anxiety and worry, and improved daily functioning. 12. Let go of blame and begin to forgive others for pain caused in childhood. Objective Increase level of trust of others as shown by more socialization and greater intimacy tolerance. Target Date: 2024-06-01 Frequency: Biweekly  Progress: 60 Modality: individual  Related Interventions Teach the client the share-check method of building trust in relationships (sharing a little information and checking as to the recipient's sensitivity in reacting to that information). Teach the client the advantages of treating people as trustworthy given a reasonable amount of time to assess their character. Objective Describe each family member and identify the role each played within the family. Target Date: 2024-06-01 Frequency: Biweekly  Progress: 80 Modality: individual  Related Interventions Assist the client in clarifying his/her role within the family and his/her feelings connected to that role. Objective Identify feelings associated with major traumatic incidents in childhood and with parental child-rearing patterns. Target Date: 2024-06-01 Frequency: Biweekly  Progress: 70 Modality: individual  Related Interventions Support and encourage the  client when he/she begins to express feelings of rage, sadness, fear, and rejection relating to family abuse or neglect. Assign the client to record feelings in a journal that describes memories, behavior, and emotions tied to his/her traumatic childhood experiences (or assign How the Trauma Affects Me in the Adult Psychotherapy Homework Planner by Jenniffer). Ask the client to read books on the emotional effects of neglect and abuse in childhood (e.g., It Will Never Happen to Me by [facility]; Outgrowing the Pain by Tory; Healing the Child Within by [facility]); process insights attained. 13. Reduce overall frequency, intensity, and duration of the anxiety so that daily functioning is not impaired. Objective Reestablish a consistent sleep-wake cycle. Target Date: 2024-06-01 Frequency: Biweekly  Progress: 50 Modality: individual  Related Interventions Teach and implement  sleep hygiene practices to help the client reestablish a consistent sleep-wake cycle; review, reinforce success, and provide corrective feedback toward improvement. Objective Identify, challenge, and replace biased, fearful self-talk with positive, realistic, and empowering self-talk. Target Date: 2024-06-01 Frequency: Biweekly  Progress: 40 Modality: individual  Related Interventions Explore the client's schema and self-talk that mediate his/her fear response; assist him/her in challenging the biases; replace the distorted messages with reality-based alternatives and positive, realistic self-talk that will increase his/her self-confidence in coping with irrational fears (see Cognitive Therapy of Anxiety Disorders by Gretta armin Mon). Assign the client a homework exercise in which he/she identifies fearful self-talk, identifies biases in the self-talk, generates alternatives, and tests through behavioral experiments (or assign Negative Thoughts Trigger Negative Feelings in the Adult Psychotherapy Homework Planner by [facility]); review and  reinforce success, providing corrective feedback toward improvement. Objective Learn and implement problem-solving strategies for realistically addressing worries. Target Date: 2024-06-01 Frequency: Biweekly  Progress: 60 Modality: individual  Related Interventions Teach the client problem-solving strategies involving specifically defining a problem, generating options for addressing it, evaluating the pros and cons of each option, selecting and implementing an optional action, and reevaluating and refining the action (or assign Applying Problem-Solving to Interpersonal Conflict in the Adult Psychotherapy Homework Planner by Jenniffer). Objective Learn and implement calming skills to reduce overall anxiety and manage anxiety symptoms. Target Date: 2024-06-01 Frequency: Biweekly  Progress: 70 Modality: individual  Related Interventions Teach the client calming/relaxation skills (e.g., applied relaxation, progressive muscle relaxation, cue controlled relaxation; mindful breathing; biofeedback) and how to discriminate better between relaxation and tension; teach the client how to apply these skills to his/her daily life (e.g., New Directions in Progressive Muscle Relaxation by Thornell Collier, and Hazlett-Stevens; Treating Generalized Anxiety Disorder by Rygh and Red). Assign the client to read about progressive muscle relaxation and other calming strategies in relevant books or treatment manuals (e.g., Progressive Relaxation Training by Thornell and Collier; Mastery of Your Anxiety and Worry: Workbook by Richarda armin Given). Objective Learn and implement relapse prevention strategies for managing possible future anxiety symptoms. Target Date: 2024-06-01 Frequency: Biweekly  Progress: 40 Modality: individual  Related Interventions Discuss with the client the distinction between a lapse and relapse, associating a lapse with an initial and reversible return of worry, anxiety symptoms, or  urges to avoid, and relapse with the decision to continue the fearful and avoidant patterns. Identify and rehearse with the client the management of future situations or circumstances in which lapses could occur. Instruct the client to routinely use new therapeutic skills (e.g., relaxation, cognitive restructuring, exposure, and problem-solving) in daily life to address emergent worries, anxiety, and avoidant tendencies. Develop a coping card on which coping strategies and other important information (e.g., Breathe deeply and relax, Challenge unrealistic worries, Use problem-solving) are written for the client's later use.  Diagnosis:Major depressive disorder, recurrent episode, moderate (HCC)  Generalized anxiety disorder  PTSD (post-traumatic stress disorder)  Plan:  -meet again Thursday, April 19, 2024 at 10am in-person

## 2024-04-18 ENCOUNTER — Ambulatory Visit: Admitting: Family

## 2024-04-19 ENCOUNTER — Ambulatory Visit: Admitting: Professional

## 2024-04-23 ENCOUNTER — Telehealth: Payer: Self-pay | Admitting: Family

## 2024-04-23 NOTE — Telephone Encounter (Signed)
 Copied from CRM #[id number]. Topic: Appointments - Scheduling Inquiry for Clinic >> Apr 23, 2024  8:23 AM Donna BRAVO wrote: Reason for CRM:  Patient has surgery scheduled for 05/16/23 the appt for surgical clearance appt the canceled and not rescheduled. First available appt is Mid January.   Please call Patient to schedule appt.    ----------------------------------------------------------------------- From previous Reason for Contact - Scheduling: Patient/patient representative is calling to schedule an appointment. Refer to attachments for appointment information.  Surgery clearance  Is it ok to put her in a same day slot?

## 2024-04-23 NOTE — Telephone Encounter (Signed)
 Patient was scheduled to come in tomorrow at 9 am

## 2024-04-24 ENCOUNTER — Ambulatory Visit: Payer: Self-pay | Admitting: Family

## 2024-04-24 VITALS — BP 111/73 | HR 73 | Temp 98.7°F | Resp 16 | Ht 66.0 in | Wt 113.8 lb

## 2024-04-24 DIAGNOSIS — M180 Bilateral primary osteoarthritis of first carpometacarpal joints: Secondary | ICD-10-CM | POA: Diagnosis not present

## 2024-04-24 DIAGNOSIS — K581 Irritable bowel syndrome with constipation: Secondary | ICD-10-CM

## 2024-04-24 DIAGNOSIS — F411 Generalized anxiety disorder: Secondary | ICD-10-CM

## 2024-04-24 DIAGNOSIS — F331 Major depressive disorder, recurrent, moderate: Secondary | ICD-10-CM

## 2024-04-24 DIAGNOSIS — M160 Bilateral primary osteoarthritis of hip: Secondary | ICD-10-CM | POA: Diagnosis not present

## 2024-04-24 DIAGNOSIS — F410 Panic disorder [episodic paroxysmal anxiety] without agoraphobia: Secondary | ICD-10-CM | POA: Diagnosis not present

## 2024-04-24 DIAGNOSIS — Z1231 Encounter for screening mammogram for malignant neoplasm of breast: Secondary | ICD-10-CM | POA: Diagnosis not present

## 2024-04-24 DIAGNOSIS — Z0000 Encounter for general adult medical examination without abnormal findings: Secondary | ICD-10-CM

## 2024-04-24 DIAGNOSIS — F419 Anxiety disorder, unspecified: Secondary | ICD-10-CM | POA: Diagnosis not present

## 2024-04-24 NOTE — Progress Notes (Signed)
 "  Subjective:     Patient ID: Shannon [name], female    DOB: 05/18/1960, 63 y.o.   MRN: [id number]  Chief Complaint  Patient presents with   Anxiety    Here for follow up last UDS and CSC 05/30/23    HPI  Discussed the use of AI scribe software for clinical note transcription with the patient, who gave verbal consent to proceed.  History of Present Illness Shannon [name] is a 63 year old female with anxiety and osteoarthritis who presents with worsening anxiety and medication management issues.  Her anxiety has worsened, particularly during the holiday season, with increased panic about various issues, including upcoming surgeries. She is currently working with a therapist and plans to start EMDR therapy in 06-06-24. She takes Buspar  15 mg twice daily, Wellbutrin  300 mg daily, and clonazepam  0.5 mg three times daily as needed. Recently, she has had to take clonazepam  four times on some days due to increased anxiety, particularly in the afternoons and evenings. An issue with her pharmacy providing only 30 pills instead of 90 caused significant distress until resolved.  She has a history of osteoarthritis affecting her wrists and hips. She has been seeing a specialist for six years and has received injections, which have recently become ineffective. She experiences constant pain and difficulty gripping objects, leading to frequent dropping of items.  She takes Linzess  daily for gastrointestinal issues, which she finds helpful. However, during a recent trip to [place] , she experienced significant bloating, likely due to dietary changes. She also uses Miralax  daily to aid with her symptoms.  She has not had a Pap smear in four years and plans to schedule one soon. She has not received a flu shot this year but is considering it due to recent illness in her family. She is also contemplating the pneumonia and shingles vaccines.      Health Maintenance Due  Topic Date Due   Pneumococcal  Vaccine: 50+ Years (1 of 1 - PCV) Never done   Cervical Cancer Screening (HPV/Pap Cotest)  03/17/2024   Mammogram  05/16/2024    Past Medical History:  Diagnosis Date   Anemia    Atypical chest pain 03/08/2012   Bilateral thumb pain 01/12/2018   Bilateral wrist pain 01/12/2018   Cervical spondylosis without myelopathy 02/12/2016   Chronic midline posterior neck pain 06/19/2015   Concussion without loss of consciousness 06/18/2017   Constipation 01/14/2021   Ecchymosis 05/25/2011   Family history of colonic polyps 12/26/2017   Generalized anxiety disorder with panic attacks 12/30/2010   Grief reaction 02/03/2022   History of eating disorder    anorexia nervosa in her 06-Jun-2024   Kidney stones    passed on their own   Laceration of left thumb 01/14/2021   Low back pain 03/03/2020   Lumbar radicular pain 06/19/2015   Major depressive disorder 12/23/2012   Mild neurocognitive disorder due to another medical condition 10/20/2017   Mixed obsessional thoughts and acts 03/09/2017   Osteoarthritis of carpometacarpal (CMC) joint of thumb 01/10/2020   Postlaminectomy syndrome, lumbar region 06/19/2015   Preventative health care 02/15/2012   Raynaud's disease 03/23/2013   Recurrent major depressive disorder, in partial remission 03/09/2017   Sacroiliac dysfunction 06/19/2015   Spinal stenosis of lumbar region with radiculopathy 04/15/2022   Spondylosis of lumbar region without myelopathy or radiculopathy 07/15/2015   Stomach disorder    due to complications with cholecystectomy almost died    Past Surgical History:  Procedure Laterality Date  APPENDECTOMY  1976   BREAST BIOPSY Right    CHOLECYSTECTOMY  08/2009   reports history of gallbladder polyps, she reports stents in duct of lushca    COLONOSCOPY  2022   HYSTEROSCOPY  07/2011   LAMINECTOMY  1994   L4-5   LUMBAR LAMINECTOMY/DECOMPRESSION MICRODISCECTOMY N/A 04/15/2022   Procedure: LUMBAR THREE-FOUR LUMBAR LAMINECTOMY;   Surgeon: Duwayne Purchase, MD;  Location: [facility] OR;  Service: Orthopedics;  Laterality: N/A;  2 hrs 3 C-Bed   TONSILLECTOMY  1982   TUBAL LIGATION      Family History  Problem Relation Age of Onset   Hypertension Mother    Memory loss Mother    Dementia Mother    Alcoholism Mother    Diabetes Father    Hypertension Father    Hypothyroidism Father    CAD Father    Stroke Father    Adrenal disorder Father    Adrenal disorder Sister    Hashimoto's thyroiditis Sister    Hypothyroidism Sister    Raynaud syndrome Sister    Neuropathy Sister        chronic inflammatory demyelinating peripheral neuropathy from lyme disease   Alcohol abuse Maternal Grandmother    Cancer Paternal Grandfather        lung cancer   Hypothyroidism Other     Social History   Socioeconomic History   Marital status: Married    Spouse name: Not on file   Number of children: 3   Years of education: 15   Highest education level: Associate degree: academic program  Occupational History    Employer: SELF EMPLOYED  Tobacco Use   Smoking status: Never   Smokeless tobacco: Never  Vaping Use   Vaping status: Never Used  Substance and Sexual Activity   Alcohol use: Yes    Comment: rare   Drug use: Not Currently   Sexual activity: Yes    Partners: Male    Birth control/protection: Surgical  Other Topics Concern   Not on file  Social History Narrative   Regular exercise:  Yes (swim instructor)   Caffeine Use:  1 daily   Married 3 children ages 5 son, 88 son, and 102 daughter   Associates degree   Right handed          Social Drivers of Health   Tobacco Use: Low Risk (04/13/2024)   Patient History    Smoking Tobacco Use: Never    Smokeless Tobacco Use: Never    Passive Exposure: Not on file  Financial Resource Strain: Not on file  Food Insecurity: Not on file  Transportation Needs: Not on file  Physical Activity: Not on file  Stress: Not on file  Social Connections: Not on file  Intimate  Partner Violence: Not on file  Depression (PHQ2-9): High Risk (04/24/2024)   Depression (PHQ2-9)    PHQ-2 Score: 13  Alcohol Screen: Not on file  Housing: Not on file  Utilities: Not on file  Health Literacy: Not on file    Outpatient Medications Prior to Visit  Medication Sig Dispense Refill   buPROPion  (WELLBUTRIN  XL) 300 MG 24 hr tablet Take 1 tablet (300 mg total) by mouth daily. 90 tablet 1   busPIRone  (BUSPAR ) 15 MG tablet TAKE 1 TABLET BY MOUTH 2 TIMES A DAY 180 tablet 1   KLONOPIN  0.5 MG tablet TAKE 1 TABLET(0.5 MG) BY MOUTH THREE TIMES DAILY AS NEEDED FOR ANXIETY 90 tablet 0   lidocaine  (LIDODERM ) 5 % Place 1 patch onto the skin  daily as needed (pain). Remove & Discard patch within 12 hours or as directed by MD     LINZESS  290 MCG CAPS capsule Take 290 mcg by mouth daily.     MAGNESIUM  GLYCINATE PO Take 120 mg by mouth at bedtime.     OVER THE COUNTER MEDICATION Take 1 capsule by mouth daily. Brain Elevate     polyethylene glycol (MIRALAX  / GLYCOLAX ) 17 g packet Take 17 g by mouth daily. 14 each 0   melatonin 3 MG TABS tablet Take 3 mg by mouth at bedtime.     Turmeric (QC TUMERIC COMPLEX PO) Take by mouth.     No facility-administered medications prior to visit.    Allergies[1]  ROS See HPI    Objective:    Physical Exam Constitutional:      General: She is not in acute distress.    Appearance: Normal appearance. She is well-developed.  HENT:     Head: Normocephalic and atraumatic.     Right Ear: External ear normal.     Left Ear: External ear normal.  Eyes:     General: No scleral icterus. Neck:     Thyroid : No thyromegaly.  Cardiovascular:     Rate and Rhythm: Normal rate and regular rhythm.     Heart sounds: Normal heart sounds. No murmur heard. Pulmonary:     Effort: Pulmonary effort is normal. No respiratory distress.     Breath sounds: Normal breath sounds. No wheezing.  Musculoskeletal:     Cervical back: Neck supple.  Skin:    General: Skin is  warm and dry.  Neurological:     Mental Status: She is alert and oriented to person, place, and time.  Psychiatric:        Mood and Affect: Mood is anxious.        Behavior: Behavior normal.        Thought Content: Thought content normal.        Judgment: Judgment normal.      BP 111/73 (BP Location: Right Arm, Patient Position: Sitting, Cuff Size: Small)   Pulse 73   Temp 98.7 F (37.1 C) (Oral)   Resp 16   Ht 5' 6 (1.676 m)   Wt 113 lb 12.8 oz (51.6 kg)   LMP 03/19/2017   SpO2 99%   BMI 18.37 kg/m  Wt Readings from Last 3 Encounters:  04/24/24 113 lb 12.8 oz (51.6 kg)  10/18/23 113 lb (51.3 kg)  05/30/23 118 lb (53.5 kg)       Assessment & Plan:   Problem List Items Addressed This Visit       Unprioritized   Recurrent major depressive disorder    Recurrent major depressive disorder, in partial remission Depression in partial remission. Bupropion  300 mg daily effective. - Continue Bupropion  300 mg daily.      Osteoarthritis of hips, bilateral    Osteoarthritis in both hips. Right hip surgery planned post-thumb surgery, anticipated easier recovery. - Plan for right hip surgery after thumb surgery, likely in August.       Osteoarthritis of carpometacarpal [facility]) joint of thumb    Osteoarthritis of right thumb carpometacarpal joint Severe osteoarthritis with significant pain. Previous injections ineffective. Surgery scheduled for January 27th, with potential earlier date. Post-surgery recovery includes casting and physical therapy. - Proceed with scheduled surgery on January 27th, with potential for earlier date if cancellations occur. - Plan for four weeks in a cast followed by physical therapy post-surgery.  IBS (irritable colon syndrome)   Chronic constipation- Managed with Linzess  and Miralax . Regular Linzess  use effective, recent travel disrupted routine. - Continue Linzess  290 mcg daily. - Continue Miralax  as needed.      Generalized anxiety  disorder with panic attacks   Generalized anxiety disorder with panic attacks Anxiety worsened due to holiday season and family health concerns. Clonazepam  usage increased, insurance issues reduced prescription quantity. EMDR therapy considered, neurofeedback if ineffective. Discussed potential SSRI addition with weight monitoring. - Continue Buspirone  15 mg twice daily. - Continue Bupropion  300 mg daily. - Continue Clonazepam  0.5 mg three times daily as needed. - Proceed with EMDR therapy starting in January. - Consider neurofeedback if EMDR is ineffective. - Consider adding a low-dose SSRI if anxiety persists, with close monitoring of weight.      Other Visit Diagnoses       Breast cancer screening by mammogram    -  Primary   Relevant Orders   MM 3D SCREENING MAMMOGRAM BILATERAL BREAST     Anxiety       Relevant Orders   Clonazepam  level   DRUG MONITORING, PANEL 8 WITH CONFIRMATION, URINE     Healthcare maintenance         General health maintenance Pap smear overdue. Flu shot, Pneumonia and shingles vaccines recommended. Mammogram ordered. - Schedule Pap smear with GYN and request results be sent to the office. - Consider flu shot, available at pharmacy or clinic. - Consider pneumonia vaccine due to age. - Consider shingles vaccine  - Scheduled mammogram for routine screening. Assessment & Plan       I have discontinued Shannon [name]'s melatonin and Turmeric (QC TUMERIC COMPLEX PO). I am also having her maintain her Linzess , MAGNESIUM  GLYCINATE PO, OVER THE COUNTER MEDICATION, lidocaine , polyethylene glycol, busPIRone , KlonoPIN , and buPROPion .  No orders of the defined types were placed in this encounter.     [1]  Allergies Allergen Reactions   Buprenorphine Hcl     No relief   Cortisporin [Neomycin-Polymyxin-Hc] Swelling    Ear dropps   Eszopiclone     Headaches     Flexeril [Cyclobenzaprine Hcl] Other (See Comments)    Mood swings   Gabapentin      sedation   Morphine And Codeine Other (See Comments)    No relief   Nsaids Other (See Comments)    GI bleed   Paroxetine  Nausea Only        Shellfish Allergy Other (See Comments)    skin & mouth tingle   Tizanidine Hcl Other (See Comments)    Dizziness, mood swings   Tolmetin     GI bleed   "

## 2024-04-24 NOTE — Assessment & Plan Note (Signed)
 Chronic constipation- Managed with Linzess  and Miralax . Regular Linzess  use effective, recent travel disrupted routine. - Continue Linzess  290 mcg daily. - Continue Miralax  as needed.

## 2024-04-24 NOTE — Assessment & Plan Note (Signed)
 Generalized anxiety disorder with panic attacks Anxiety worsened due to holiday season and family health concerns. Clonazepam  usage increased, insurance issues reduced prescription quantity. EMDR therapy considered, neurofeedback if ineffective. Discussed potential SSRI addition with weight monitoring. - Continue Buspirone  15 mg twice daily. - Continue Bupropion  300 mg daily. - Continue Clonazepam  0.5 mg three times daily as needed. - Proceed with EMDR therapy starting in January. - Consider neurofeedback if EMDR is ineffective. - Consider adding a low-dose SSRI if anxiety persists, with close monitoring of weight.

## 2024-04-24 NOTE — Patient Instructions (Signed)
" °  VISIT SUMMARY: During your visit, we discussed your worsening anxiety, osteoarthritis, and general health maintenance. We reviewed your current medications and therapy plans, and we also talked about upcoming surgeries and vaccinations.  YOUR PLAN: -GENERALIZED ANXIETY DISORDER WITH PANIC ATTACKS: Generalized anxiety disorder involves excessive worry about various aspects of life. Your anxiety has worsened recently, especially during the holiday season. We will continue your current medications: Buspirone  15 mg twice daily, Bupropion  300 mg daily, and Clonazepam  0.5 mg three times daily as needed. You will start EMDR therapy in January, and if it is not effective, we may consider neurofeedback. If your anxiety persists, we might add a low-dose SSRI, with close monitoring of your weight.  -RECURRENT MAJOR DEPRESSIVE DISORDER, IN PARTIAL REMISSION: Recurrent major depressive disorder is a condition where periods of depression come and go. Your depression is currently in partial remission, and Bupropion  300 mg daily has been effective. We will continue this medication.  -OSTEOARTHRITIS OF RIGHT THUMB CARPOMETACARPAL JOINT: Osteoarthritis is a condition where the cartilage in your joints wears down, causing pain and stiffness. Your right thumb has severe osteoarthritis, and previous injections have not been effective. You have surgery scheduled for January 27th, with a possibility of an earlier date if there are cancellations. After surgery, you will be in a cast for four weeks, followed by physical therapy.  -OSTEOARTHRITIS OF BILATERAL HIPS: Osteoarthritis in your hips causes pain and difficulty with movement. We plan to schedule surgery for your right hip after your thumb surgery, likely in August.  -CHRONIC CONSTIPATION: Chronic constipation is a condition where you have infrequent or difficult bowel movements. Your condition is managed with Linzess  and Miralax . Continue taking Linzess  290 mcg daily  and use Miralax  as needed.  -GENERAL HEALTH MAINTENANCE: For your general health, you are overdue for a Pap smear and should schedule one soon. Consider getting a flu shot, which is available at the pharmacy or clinic. Due to your age, you should also consider the pneumonia vaccine. Additionally, consider the shingles vaccine, which may help prevent Alzheimer's disease. You have a mammogram scheduled for routine screening.  INSTRUCTIONS: Please schedule a Pap smear and ensure the results are sent to our office. Consider getting the flu, pneumonia, and shingles vaccines. Your thumb surgery is scheduled for January 27th, but there is a possibility of an earlier date if there are cancellations. After the thumb surgery, plan for right hip surgery, likely in August.                       "

## 2024-04-24 NOTE — Assessment & Plan Note (Signed)
" °  Recurrent major depressive disorder, in partial remission Depression in partial remission. Bupropion  300 mg daily effective. - Continue Bupropion  300 mg daily. "

## 2024-04-24 NOTE — Assessment & Plan Note (Signed)
" °  Osteoarthritis of right thumb carpometacarpal joint Severe osteoarthritis with significant pain. Previous injections ineffective. Surgery scheduled for January 27th, with potential earlier date. Post-surgery recovery includes casting and physical therapy. - Proceed with scheduled surgery on January 27th, with potential for earlier date if cancellations occur. - Plan for four weeks in a cast followed by physical therapy post-surgery. "

## 2024-04-24 NOTE — Assessment & Plan Note (Signed)
" °  Osteoarthritis in both hips. Right hip surgery planned post-thumb surgery, anticipated easier recovery. - Plan for right hip surgery after thumb surgery, likely in August.  "

## 2024-04-27 ENCOUNTER — Ambulatory Visit: Payer: Self-pay | Admitting: Family

## 2024-04-27 LAB — DRUG MONITORING, PANEL 8 WITH CONFIRMATION, URINE
6 Acetylmorphine: NEGATIVE ng/mL
Alcohol Metabolites: NEGATIVE ng/mL
Alphahydroxyalprazolam: NEGATIVE ng/mL
Alphahydroxymidazolam: NEGATIVE ng/mL
Alphahydroxytriazolam: NEGATIVE ng/mL
Aminoclonazepam: 322 ng/mL — ABNORMAL HIGH
Amphetamines: NEGATIVE ng/mL
Benzodiazepines: POSITIVE ng/mL — AB
Buprenorphine, Urine: NEGATIVE ng/mL
Cocaine Metabolite: NEGATIVE ng/mL
Creatinine: 43.9 mg/dL
Hydroxyethylflurazepam: NEGATIVE ng/mL
Lorazepam: NEGATIVE ng/mL
MDMA: NEGATIVE ng/mL
Marijuana Metabolite: NEGATIVE ng/mL
Nordiazepam: NEGATIVE ng/mL
Opiates: NEGATIVE ng/mL
Oxazepam: NEGATIVE ng/mL
Oxidant: NEGATIVE ug/mL
Oxycodone: NEGATIVE ng/mL
Temazepam: NEGATIVE ng/mL
pH: 5.1 (ref 4.5–9.0)

## 2024-04-27 LAB — CLONAZEPAM LEVEL: Clonazepam Lvl: 23 ug/L — ABNORMAL LOW (ref 30–60)

## 2024-04-27 LAB — DM TEMPLATE

## 2024-05-08 ENCOUNTER — Institutional Professional Consult (permissible substitution): Admitting: Psychology

## 2024-05-08 ENCOUNTER — Ambulatory Visit: Payer: Self-pay

## 2024-05-10 ENCOUNTER — Encounter: Payer: Self-pay | Admitting: Professional

## 2024-05-10 ENCOUNTER — Ambulatory Visit: Admitting: Professional

## 2024-05-10 DIAGNOSIS — F4310 Post-traumatic stress disorder, unspecified: Secondary | ICD-10-CM

## 2024-05-10 DIAGNOSIS — F331 Major depressive disorder, recurrent, moderate: Secondary | ICD-10-CM

## 2024-05-10 DIAGNOSIS — F411 Generalized anxiety disorder: Secondary | ICD-10-CM | POA: Diagnosis not present

## 2024-05-10 NOTE — Progress Notes (Signed)
 " [name] Counselor/Therapist Progress Note  Patient ID: Shannon [name], MRN: [id number],    Date: 05/10/2024  Time Spent: 60 minutes 3-4pm  Treatment Type: Individual Therapy  Risk Assessment: Danger to Self:  No Self-injurious Behavior: No Danger to Others: No  Subjective: The patient arrived on time for her in person appointment.   Issues addressed: 1-Christmas -was fine, but did not enjoy 2-Matt -has only interacted with Shannon [name] on one occasion since Thanksgiving -pt requested time with Shannon [name] for girls day and it would include Shannon [name] agreed and pt, Shannon, and Shannon [name] had a good time -Shannon appears to have moved past her issues with Shannon [name] and will no longer avoid family activities or time she want to spend with her family 3-begin EMDR -reviewed step by step how EMDR will impact pt's trauma memories -pt began timeline with death of father two years ago and shared her account moment by moment -we established a happy place she can go if she requires a break from the memory -pt's happy place was Shannon [name] citing she always was able to have a good time no matter what (when she went with her parents as a child, when she went with her children, and with her grandchildren)  Treatment Plan Problems Addressed  Anxiety, Childhood Trauma, Low Self-Esteem, Unipolar Depression Goals 1. Alleviate depressive symptoms and return to previous level of effective functioning. 2. Appropriately grieve the loss in order to normalize mood and to return to previously adaptive level of functioning. Objective Learn and implement behavioral strategies to overcome depression. Target Date: 2024-06-01 Frequency: Biweekly  Progress: 60 Modality: individual  Related Interventions Engage the client in behavioral activation, increasing his/her activity level and contact with sources of reward, while identifying processes that inhibit activation (see Behavioral Activation for  Depression by Loleta, Dimidjian, and Herman-Dunn; or assign Identify and Schedule Pleasant Activities in the Adult Psychotherapy Homework Planner by [facility]); use behavioral techniques such as instruction, rehearsal, role-playing, role reversal, as needed, to facilitate activity in the client's daily life; reinforce success. Assist the client in developing skills that increase the likelihood of deriving pleasure from behavioral activation (e.g., assertiveness skills, developing an exercise plan, less internal/more external focus, increased social involvement); reinforce success. Objective Learn and implement conflict resolution skills to resolve interpersonal problems. Target Date: 2024-06-01 Frequency: Biweekly  Progress: 70 Modality: individual  Related Interventions Teach conflict resolution skills (e.g., empathy, active listening, I messages, respectful communication, assertiveness without aggression, compromise); use psychoeducation, modeling, role-playing, and rehearsal to work through several current conflicts; assign homework exercises; review and repeat so as to integrate their use into the client's life. Objective Learn and implement relapse prevention skills. Target Date: 2024-06-01 Frequency: Biweekly  Progress: 40 Modality: individual  Related Interventions Identify and rehearse with the client the management of future situations or circumstances in which lapses could occur. Objective Verbalize an understanding of healthy and unhealthy emotions with the intent of increasing the use of healthy emotions to guide actions. Target Date: 2024-06-01 Frequency: Biweekly  Progress: 80 Modality: individual  Related Interventions Use a process-experiential approach consistent with Emotion-Focused Therapy to create a safe, nurturing environment in which the client can process emotions, learning to identify and regulate unhealthy feelings and to generate more adaptive ones that then guide  actions (see Emotion-Focused Therapy for Depression by Audrey armin Portugal). 3. Demonstrate improved self-esteem through more pride in appearance, more assertiveness, greater eye contact, and identification of positive traits in self-talk messages. 4. Develop an awareness of how childhood issues  have affected and continue to affect one's family life. 5. Develop healthy interpersonal relationships that lead to the alleviation and help prevent the relapse of depression. 6. Develop healthy thinking patterns and beliefs about self, others, and the world that lead to the alleviation and help prevent the relapse of depression. 7. Elevate self-esteem. Objective Demonstrate an increased ability to identify and express personal feelings. Target Date: 2024-06-01 Frequency: Biweekly  Progress: 50 Modality: individual  Related Interventions Assist the client in identifying and labeling emotions. Objective Acknowledge feeling less competent than most others. Target Date: 06-01-2024 Frequency: Biweekly  Progress: 70 Modality: individual  Related Interventions Explore the client's assessment of himself/herself and what is verbalized as the basis for negative self-perception. Actively build the level of trust with the client in individual sessions through consistent eye contact, active listening, unconditional positive regard, and warm acceptance to help increase his/her ability to identify and express feelings. Objective Decrease the frequency of negative self-descriptive statements and increase frequency of positive self-descriptive statements. Target Date: 2024-06-01 Frequency: Biweekly  Progress: 40 Modality: individual  Related Interventions Assist the client in becoming aware of how he/she expresses or acts out negative feelings about himself/herself. Help the client reframe his/her negative assessment of himself/herself. Assist the client in developing positive self-talk as a way of boosting  his/her confidence and self-image (or assign Positive Self-Talk in the Adult Psychotherapy Homework Planner by Jenniffer). Objective Identify and engage in activities that would improve self-image by being consistent with one's values. Target Date: 2024-06-01 Frequency: Biweekly  Progress: 60 Modality: individual  Related Interventions Help the client analyze his/her values and the congruence or incongruence between them and the client's daily activities. Identify and assign activities congruent with the client's values; process them toward improving self-concept and self-esteem. Objective Identify positive traits and talents about self. Target Date: 2024-06-01 Frequency: Biweekly  Progress: 40 Modality: individual  Related Interventions Assign the client the exercise of identifying his/her positive physical characteristics in a mirror to help him/her become more comfortable with himself/herself. Ask the client to keep building a list of positive traits and have him/her read the list at the beginning and end of each session (or assign Acknowledging My Strengths or What Are My Good Qualities? in the Adult Psychotherapy Homework Planner by [facility]); reinforce the client's positive self-descriptive statements. 8. Enhance ability to effectively cope with the full variety of life's worries and anxieties. 9. Establish an inward sense of self-worth, confidence, and competence. 10. Interact socially without undue distress or disability. 11. Learn and implement coping skills that result in a reduction of anxiety and worry, and improved daily functioning. 12. Let go of blame and begin to forgive others for pain caused in childhood. Objective Increase level of trust of others as shown by more socialization and greater intimacy tolerance. Target Date: 2024-06-01 Frequency: Biweekly  Progress: 60 Modality: individual  Related Interventions Teach the client the share-check method of building trust in  relationships (sharing a little information and checking as to the recipient's sensitivity in reacting to that information). Teach the client the advantages of treating people as trustworthy given a reasonable amount of time to assess their character. Objective Describe each family member and identify the role each played within the family. Target Date: 2024-06-01 Frequency: Biweekly  Progress: 80 Modality: individual  Related Interventions Assist the client in clarifying his/her role within the family and his/her feelings connected to that role. Objective Identify feelings associated with major traumatic incidents in childhood and with parental child-rearing patterns. Target  Date: 2024-06-01 Frequency: Biweekly  Progress: 70 Modality: individual  Related Interventions Support and encourage the client when he/she begins to express feelings of rage, sadness, fear, and rejection relating to family abuse or neglect. Assign the client to record feelings in a journal that describes memories, behavior, and emotions tied to his/her traumatic childhood experiences (or assign How the Trauma Affects Me in the Adult Psychotherapy Homework Planner by Jenniffer). Ask the client to read books on the emotional effects of neglect and abuse in childhood (e.g., It Will Never Happen to Me by [facility]; Outgrowing the Pain by Tory; Healing the Child Within by [facility]); process insights attained. 13. Reduce overall frequency, intensity, and duration of the anxiety so that daily functioning is not impaired. Objective Reestablish a consistent sleep-wake cycle. Target Date: 2024-06-01 Frequency: Biweekly  Progress: 50 Modality: individual  Related Interventions Teach and implement sleep hygiene practices to help the client reestablish a consistent sleep-wake cycle; review, reinforce success, and provide corrective feedback toward improvement. Objective Identify, challenge, and replace biased, fearful self-talk with  positive, realistic, and empowering self-talk. Target Date: 2024-06-01 Frequency: Biweekly  Progress: 40 Modality: individual  Related Interventions Explore the client's schema and self-talk that mediate his/her fear response; assist him/her in challenging the biases; replace the distorted messages with reality-based alternatives and positive, realistic self-talk that will increase his/her self-confidence in coping with irrational fears (see Cognitive Therapy of Anxiety Disorders by Gretta armin Mon). Assign the client a homework exercise in which he/she identifies fearful self-talk, identifies biases in the self-talk, generates alternatives, and tests through behavioral experiments (or assign Negative Thoughts Trigger Negative Feelings in the Adult Psychotherapy Homework Planner by [facility]); review and reinforce success, providing corrective feedback toward improvement. Objective Learn and implement problem-solving strategies for realistically addressing worries. Target Date: 2024-06-01 Frequency: Biweekly  Progress: 60 Modality: individual  Related Interventions Teach the client problem-solving strategies involving specifically defining a problem, generating options for addressing it, evaluating the pros and cons of each option, selecting and implementing an optional action, and reevaluating and refining the action (or assign Applying Problem-Solving to Interpersonal Conflict in the Adult Psychotherapy Homework Planner by Jenniffer). Objective Learn and implement calming skills to reduce overall anxiety and manage anxiety symptoms. Target Date: 2024-06-01 Frequency: Biweekly  Progress: 70 Modality: individual  Related Interventions Teach the client calming/relaxation skills (e.g., applied relaxation, progressive muscle relaxation, cue controlled relaxation; mindful breathing; biofeedback) and how to discriminate better between relaxation and tension; teach the client how to apply these skills to  his/her daily life (e.g., New Directions in Progressive Muscle Relaxation by Thornell Collier, and Hazlett-Stevens; Treating Generalized Anxiety Disorder by Rygh and Red). Assign the client to read about progressive muscle relaxation and other calming strategies in relevant books or treatment manuals (e.g., Progressive Relaxation Training by Thornell and Collier; Mastery of Your Anxiety and Worry: Workbook by Richarda armin Given). Objective Learn and implement relapse prevention strategies for managing possible future anxiety symptoms. Target Date: 2024-06-01 Frequency: Biweekly  Progress: 40 Modality: individual  Related Interventions Discuss with the client the distinction between a lapse and relapse, associating a lapse with an initial and reversible return of worry, anxiety symptoms, or urges to avoid, and relapse with the decision to continue the fearful and avoidant patterns. Identify and rehearse with the client the management of future situations or circumstances in which lapses could occur. Instruct the client to routinely use new therapeutic skills (e.g., relaxation, cognitive restructuring, exposure, and problem-solving) in daily life to address emergent worries, anxiety, and  avoidant tendencies. Develop a coping card on which coping strategies and other important information (e.g., Breathe deeply and relax, Challenge unrealistic worries, Use problem-solving) are written for the client's later use.  Diagnosis:Major depressive disorder, recurrent episode, moderate (HCC)  Generalized anxiety disorder  PTSD (post-traumatic stress disorder)  Plan:  -establish yearly treatment plan at next visit -meet again Thursday, May 17, 2024 at [facility] in-person "

## 2024-05-11 ENCOUNTER — Encounter: Payer: Self-pay | Admitting: Family

## 2024-05-16 ENCOUNTER — Encounter: Admitting: Psychology

## 2024-05-17 ENCOUNTER — Encounter: Payer: Self-pay | Admitting: Family

## 2024-05-17 ENCOUNTER — Encounter: Payer: Self-pay | Admitting: Professional

## 2024-05-17 ENCOUNTER — Other Ambulatory Visit: Payer: Self-pay

## 2024-05-17 ENCOUNTER — Ambulatory Visit: Admitting: Professional

## 2024-05-17 DIAGNOSIS — F4310 Post-traumatic stress disorder, unspecified: Secondary | ICD-10-CM | POA: Diagnosis not present

## 2024-05-17 DIAGNOSIS — F331 Major depressive disorder, recurrent, moderate: Secondary | ICD-10-CM

## 2024-05-17 DIAGNOSIS — F419 Anxiety disorder, unspecified: Secondary | ICD-10-CM

## 2024-05-17 DIAGNOSIS — F411 Generalized anxiety disorder: Secondary | ICD-10-CM

## 2024-05-17 NOTE — Telephone Encounter (Signed)
 Requesting: Klonopin  0.5mg  Contract:06/21/23 UDS:04/24/24 Last Visit: 04/24/24 Next Visit:10/24/24 Last Refill: 04/10/24 #90 and 0RF   Please Advise
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
## 2024-05-18 MED ORDER — KLONOPIN 0.5 MG PO TABS: 0.5000 mg | ORAL_TABLET | Freq: Three times a day (TID) | ORAL | 0 refills | Status: AC | PRN

## 2024-05-23 ENCOUNTER — Ambulatory Visit: Admitting: Family

## 2024-05-24 ENCOUNTER — Encounter: Payer: Self-pay | Admitting: Professional

## 2024-05-24 ENCOUNTER — Ambulatory Visit: Admitting: Professional

## 2024-05-24 DIAGNOSIS — F331 Major depressive disorder, recurrent, moderate: Secondary | ICD-10-CM

## 2024-05-24 DIAGNOSIS — F411 Generalized anxiety disorder: Secondary | ICD-10-CM | POA: Diagnosis not present

## 2024-05-24 DIAGNOSIS — F4310 Post-traumatic stress disorder, unspecified: Secondary | ICD-10-CM | POA: Diagnosis not present
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
## 2024-05-28 ENCOUNTER — Ambulatory Visit (HOSPITAL_BASED_OUTPATIENT_CLINIC_OR_DEPARTMENT_OTHER)

## 2024-05-29 ENCOUNTER — Ambulatory Visit: Admitting: Professional

## 2024-06-07 ENCOUNTER — Ambulatory Visit (INDEPENDENT_AMBULATORY_CARE_PROVIDER_SITE_OTHER): Admitting: Professional

## 2024-06-07 ENCOUNTER — Encounter: Payer: Self-pay | Admitting: Professional

## 2024-06-07 DIAGNOSIS — F331 Major depressive disorder, recurrent, moderate: Secondary | ICD-10-CM

## 2024-06-07 DIAGNOSIS — F4310 Post-traumatic stress disorder, unspecified: Secondary | ICD-10-CM

## 2024-06-07 DIAGNOSIS — F411 Generalized anxiety disorder: Secondary | ICD-10-CM
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
## 2024-06-14 ENCOUNTER — Ambulatory Visit: Admitting: Professional

## 2024-06-21 ENCOUNTER — Ambulatory Visit: Admitting: Professional

## 2024-06-25 ENCOUNTER — Ambulatory Visit: Admitting: Neurology

## 2024-06-28 ENCOUNTER — Ambulatory Visit: Admitting: Professional

## 2024-07-05 ENCOUNTER — Ambulatory Visit: Admitting: Professional

## 2024-07-10 ENCOUNTER — Ambulatory Visit (HOSPITAL_BASED_OUTPATIENT_CLINIC_OR_DEPARTMENT_OTHER): Payer: Self-pay

## 2024-07-12 ENCOUNTER — Ambulatory Visit: Admitting: Professional

## 2024-07-19 ENCOUNTER — Ambulatory Visit: Admitting: Professional

## 2024-07-26 ENCOUNTER — Ambulatory Visit: Admitting: Professional

## 2024-08-02 ENCOUNTER — Ambulatory Visit: Admitting: Professional

## 2024-10-24 ENCOUNTER — Encounter: Admitting: Family
